# Patient Record
Sex: Female | Born: 1950 | Race: White | Hispanic: No | Smoking: Former smoker
Health system: Southern US, Community
[De-identification: ages and names within clinical notes are randomized; demographics above are authoritative.]

## PROBLEM LIST (undated history)

## (undated) DIAGNOSIS — E559 Vitamin D deficiency, unspecified: Secondary | ICD-10-CM

## (undated) DIAGNOSIS — I10 Essential (primary) hypertension: Secondary | ICD-10-CM

## (undated) DIAGNOSIS — E039 Hypothyroidism, unspecified: Secondary | ICD-10-CM

## (undated) DIAGNOSIS — C3490 Malignant neoplasm of unspecified part of unspecified bronchus or lung: Secondary | ICD-10-CM

## (undated) DIAGNOSIS — Z860100 Personal history of colon polyps, unspecified: Secondary | ICD-10-CM

## (undated) DIAGNOSIS — I700 Atherosclerosis of aorta: Secondary | ICD-10-CM

## (undated) DIAGNOSIS — I499 Cardiac arrhythmia, unspecified: Secondary | ICD-10-CM

## (undated) DIAGNOSIS — G4700 Insomnia, unspecified: Secondary | ICD-10-CM

## (undated) DIAGNOSIS — M4800 Spinal stenosis, site unspecified: Secondary | ICD-10-CM

## (undated) DIAGNOSIS — R0602 Shortness of breath: Secondary | ICD-10-CM

## (undated) DIAGNOSIS — K219 Gastro-esophageal reflux disease without esophagitis: Secondary | ICD-10-CM

## (undated) DIAGNOSIS — D649 Anemia, unspecified: Secondary | ICD-10-CM

## (undated) DIAGNOSIS — R002 Palpitations: Secondary | ICD-10-CM

## (undated) DIAGNOSIS — J449 Chronic obstructive pulmonary disease, unspecified: Secondary | ICD-10-CM

## (undated) DIAGNOSIS — E079 Disorder of thyroid, unspecified: Secondary | ICD-10-CM

## (undated) DIAGNOSIS — Z87891 Personal history of nicotine dependence: Secondary | ICD-10-CM

## (undated) DIAGNOSIS — I471 Supraventricular tachycardia, unspecified: Secondary | ICD-10-CM

## (undated) DIAGNOSIS — I2510 Atherosclerotic heart disease of native coronary artery without angina pectoris: Secondary | ICD-10-CM

## (undated) DIAGNOSIS — Z923 Personal history of irradiation: Secondary | ICD-10-CM

## (undated) DIAGNOSIS — D249 Benign neoplasm of unspecified breast: Secondary | ICD-10-CM

## (undated) DIAGNOSIS — R0600 Dyspnea, unspecified: Secondary | ICD-10-CM

## (undated) DIAGNOSIS — E7800 Pure hypercholesterolemia, unspecified: Secondary | ICD-10-CM

## (undated) DIAGNOSIS — G4730 Sleep apnea, unspecified: Secondary | ICD-10-CM

## (undated) DIAGNOSIS — F419 Anxiety disorder, unspecified: Secondary | ICD-10-CM

## (undated) DIAGNOSIS — C50919 Malignant neoplasm of unspecified site of unspecified female breast: Secondary | ICD-10-CM

## (undated) DIAGNOSIS — C801 Malignant (primary) neoplasm, unspecified: Secondary | ICD-10-CM

## (undated) HISTORY — DX: Supraventricular tachycardia, unspecified: I47.10

## (undated) HISTORY — DX: Atherosclerotic heart disease of native coronary artery without angina pectoris: I25.10

## (undated) HISTORY — PX: BREAST BIOPSY: SHX20

## (undated) HISTORY — PX: CHOLECYSTECTOMY: SHX55

## (undated) HISTORY — DX: Chronic obstructive pulmonary disease, unspecified: J44.9

## (undated) HISTORY — DX: Malignant neoplasm of unspecified site of unspecified female breast: C50.919

## (undated) HISTORY — DX: Atherosclerosis of aorta: I70.0

## (undated) HISTORY — DX: Palpitations: R00.2

## (undated) HISTORY — DX: Benign neoplasm of unspecified breast: D24.9

## (undated) HISTORY — DX: Personal history of nicotine dependence: Z87.891

## (undated) HISTORY — DX: Disorder of thyroid, unspecified: E07.9

## (undated) HISTORY — DX: Anxiety disorder, unspecified: F41.9

## (undated) HISTORY — DX: Spinal stenosis, site unspecified: M48.00

## (undated) HISTORY — DX: Malignant (primary) neoplasm, unspecified: C80.1

---

## 1973-08-15 HISTORY — PX: BREAST EXCISIONAL BIOPSY: SUR124

## 1976-08-15 HISTORY — PX: VAGINAL HYSTERECTOMY: SUR661

## 1983-08-16 HISTORY — PX: BREAST CYST ASPIRATION: SHX578

## 1993-08-15 DIAGNOSIS — C801 Malignant (primary) neoplasm, unspecified: Secondary | ICD-10-CM

## 1993-08-15 HISTORY — PX: ROTATOR CUFF REPAIR: SHX139

## 1993-08-15 HISTORY — DX: Malignant (primary) neoplasm, unspecified: C80.1

## 1993-08-15 HISTORY — PX: VULVECTOMY PARTIAL: SHX6187

## 1999-08-16 HISTORY — PX: BLADDER SUSPENSION: SHX72

## 2005-11-16 ENCOUNTER — Ambulatory Visit: Payer: Self-pay | Admitting: Family Medicine

## 2007-05-07 ENCOUNTER — Ambulatory Visit: Payer: Self-pay | Admitting: Family Medicine

## 2010-09-16 ENCOUNTER — Ambulatory Visit: Payer: Self-pay | Admitting: Family Medicine

## 2010-10-21 ENCOUNTER — Ambulatory Visit: Payer: Self-pay | Admitting: Family Medicine

## 2011-12-07 ENCOUNTER — Ambulatory Visit: Payer: Self-pay | Admitting: Family Medicine

## 2012-08-15 HISTORY — PX: COLONOSCOPY: SHX174

## 2012-12-25 ENCOUNTER — Ambulatory Visit: Payer: Self-pay | Admitting: Family Medicine

## 2013-01-11 ENCOUNTER — Ambulatory Visit: Payer: Self-pay | Admitting: Family Medicine

## 2013-04-04 ENCOUNTER — Ambulatory Visit: Payer: Self-pay | Admitting: Unknown Physician Specialty

## 2013-04-04 LAB — HM COLONOSCOPY

## 2013-04-08 LAB — PATHOLOGY REPORT

## 2014-01-02 ENCOUNTER — Ambulatory Visit: Payer: Self-pay | Admitting: Family Medicine

## 2014-01-02 ENCOUNTER — Encounter: Payer: Self-pay | Admitting: General Surgery

## 2014-01-02 ENCOUNTER — Ambulatory Visit (INDEPENDENT_AMBULATORY_CARE_PROVIDER_SITE_OTHER): Payer: BC Managed Care – PPO | Admitting: General Surgery

## 2014-01-02 VITALS — BP 190/96 | HR 88 | Resp 14 | Ht 64.5 in | Wt 201.0 lb

## 2014-01-02 DIAGNOSIS — N63 Unspecified lump in unspecified breast: Secondary | ICD-10-CM

## 2014-01-02 DIAGNOSIS — R928 Other abnormal and inconclusive findings on diagnostic imaging of breast: Secondary | ICD-10-CM

## 2014-01-02 NOTE — Patient Instructions (Addendum)
Continue self breast exams. Call office for any new breast issues or concerns. Breast biopsy for 01-03-14

## 2014-01-02 NOTE — Progress Notes (Signed)
Patient ID: Michelle Pitts, female   DOB: 09-Jun-1951, 63 y.o.   MRN: [id number]  Chief Complaint  Patient presents with  . Breast Problem    mammogram    HPI Michelle Pitts is a 63 y.o. female.  who presents for a breast evaluation. The most recent mammogram was done on 01-02-14. She states she that last year [place] brought her attention to a dimpling "indention" in the left breast that is still there. She had decided to monitor the area and she does not feel it has changed but now there a knot above the area that she can feel and found in March which does seem to be getting larger.  The patient underwent surgery for vulvar dysplasia in 1995 and reports that she was placed on Premarin at that time. She is continued on the medication with attempt to discontinue it associated with debilitating vasomotor symptoms.  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 She provides afternoon care for her fifth grade granddaughter and every other weekend takes her 10-year-old twin grandchildren.  Patient does perform regular self breast checks and gets regular mammograms done.   She states that the mammogram shows a problem in the right breast as well.  HPI  Past Medical History  Diagnosis Date  . Anxiety   . COPD (chronic obstructive pulmonary disease)     bronchitis  . Cancer 1995    vulvar, [facility]    Past Surgical History  Procedure Laterality Date  . Abdominal hysterectomy  1978    partial  . Cholecystectomy    . Bladder suspension  2001  . Rotator cuff repair Right 1995  . Breast biopsy Right 1975, 1985    benign cyst [name]) and fibroadenoma([facility])    Family History  Problem Relation Age of Onset  . Diabetes Father   . COPD Mother   . Cancer Other     great great paternal aunt, ? age  . Cancer Maternal Grandfather 12    ? source  . Cancer Paternal Grandfather 53    ? source    Social History History  Substance Use Topics  . Smoking  status: Current Every Day Smoker -- 1.00 packs/day    Types: Cigarettes  . Smokeless tobacco: Never Used  . Alcohol Use: Yes     Comment: 2/day    Allergies  Allergen Reactions  . Codeine Nausea And Vomiting  . Levaquin [Levofloxacin In D5w] Hives    Current Outpatient Prescriptions  Medication Sig Dispense Refill  . ADVAIR DISKUS 250-50 MCG/DOSE AEPB Inhale 1 puff into the lungs as needed.       . ALPRAZolam (XANAX XR) 0.5 MG 24 hr tablet Take 0.5 mg by mouth as needed for anxiety.      . CHANTIX STARTING MONTH PAK 0.5 MG X 11 & 1 MG X 42 tablet Take 50 mg by mouth 2 (two) times daily.       Marland Kitchen PREMARIN 0.625 MG tablet Take 0.625 mg by mouth daily.       . VENTOLIN HFA 108 (90 BASE) MCG/ACT inhaler Inhale 1 puff into the lungs 4 (four) times daily.        No current facility-administered medications for this visit.    Review of Systems Review of Systems  Constitutional: Negative.   Respiratory: Positive for shortness of breath.   Cardiovascular: Negative.     Blood pressure 190/96, pulse 88, resp. rate 14, height 5' 4.5" (1.638 m), weight  201 lb (91.173 kg).  Physical Exam Physical Exam  Constitutional: She is oriented to person, place, and time. She appears well-developed and well-nourished.  Eyes: Conjunctivae are normal.  Neck: Neck supple.  Cardiovascular: Normal rate, regular rhythm and normal heart sounds.   Pulmonary/Chest: Effort normal and breath sounds normal. Right breast exhibits no inverted nipple, no mass, no nipple discharge, no skin change and no tenderness. Left breast exhibits skin change. Left breast exhibits no inverted nipple, no mass, no nipple discharge and no tenderness.    Thickening 9 o'clock 2 CFN right breast 8 o'clock at inframammary fold there is an area of dimpling and faint thickening left breast  Lymphadenopathy:    She has no cervical adenopathy.    She has no axillary adenopathy.  Neurological: She is alert and oriented to person,  place, and time.  Skin: Skin is warm and dry.    Data Reviewed Mammograms dated 12/25/2012 were unremarkable. BI-RAD-1.  Bilateral mammograms dated May 20 one, 2015 were reviewed. Left breast mammogram showed no discernible abnormality. Right breast suggested a subcentimeter nodule in the lateral portion of the breast. Focal spot compression views and ultrasound completed the same date in the left breast showed a 1.4 x 1.5 x 2.3 cm irregular hyperechoic mass associated with the area of skin dimpling in thickening. In the right breast at 9:00 position, 3 cm from the nipple an irregular 6 mm hypoechoic nodule was noted. Axillary evaluation was negative bilaterally. BI-RAD-4.  PCP notes dated 12/30/2013 were reviewed. Associated laboratory studies showed a white blood cell count 4500, hemoglobin 13.9, MCV 95, normal differential, TSH 3.8, hemoglobin 85.6, creatinine 0.7, estimated GFR 89, normal electrolytes, normal liver function studies with the exception of the alkaline phosphatase which is mildly elevated at 130 (39-117) and the SGPT at 42 (0-32).  Assessment    Left breast mass, negative mammogram. Possible lobular carcinoma.  Abnormal right breast mammogram and ultrasound    Plan    Biopsy is indicated of the dominant mass in the left breast and potentially of the right breast. Due to the late hour the patient has been asked to return for 9:30 AM tomorrow morning. I see no contraindication to her driving to [place] later in the day to pick up her grandchildren.  The risks associated with biopsy were reviewed (bleeding, pain, infection) better outweighed by the need to establish a tissue diagnosis.  We briefly discussed that ongoing estrogen replacement will likely be contraindicated with or without an identification of malignancy.     PCP: Lelan Pons Olisa Quesnel 01/02/2014, 5:59 PM

## 2014-01-03 ENCOUNTER — Ambulatory Visit (INDEPENDENT_AMBULATORY_CARE_PROVIDER_SITE_OTHER): Payer: BC Managed Care – PPO

## 2014-01-03 ENCOUNTER — Ambulatory Visit: Payer: BC Managed Care – PPO

## 2014-01-03 ENCOUNTER — Ambulatory Visit (INDEPENDENT_AMBULATORY_CARE_PROVIDER_SITE_OTHER): Payer: BC Managed Care – PPO | Admitting: General Surgery

## 2014-01-03 ENCOUNTER — Encounter: Payer: Self-pay | Admitting: General Surgery

## 2014-01-03 ENCOUNTER — Other Ambulatory Visit: Payer: Self-pay | Admitting: General Surgery

## 2014-01-03 VITALS — BP 140/80 | HR 80 | Resp 16 | Ht 64.5 in | Wt 201.0 lb

## 2014-01-03 DIAGNOSIS — N63 Unspecified lump in unspecified breast: Secondary | ICD-10-CM

## 2014-01-03 DIAGNOSIS — R928 Other abnormal and inconclusive findings on diagnostic imaging of breast: Secondary | ICD-10-CM

## 2014-01-03 HISTORY — PX: BREAST BIOPSY: SHX20

## 2014-01-03 NOTE — Patient Instructions (Signed)

## 2014-01-03 NOTE — Progress Notes (Signed)
Patient ID: Michelle Pitts, female   DOB: 06-21-1951, 63 y.o.   MRN: [id number]  Chief Complaint  Patient presents with  . Follow-up    breast biopsy    HPI Michelle Pitts is a 63 y.o. female who presents for bilateral breast biopsies. Due to the late hour it could not be completed at the time of yesterday's exam. HPI  Past Medical History  Diagnosis Date  . Anxiety   . COPD (chronic obstructive pulmonary disease)     bronchitis  . Cancer 1995    vulvar, [facility]    Past Surgical History  Procedure Laterality Date  . Abdominal hysterectomy  1978    partial  . Cholecystectomy    . Bladder suspension  2001  . Rotator cuff repair Right 1995  . Breast biopsy Right 1975, 1985    benign cyst [name]) and fibroadenoma([facility])    Family History  Problem Relation Age of Onset  . Diabetes Father   . COPD Mother   . Cancer Other     great great paternal aunt, ? age  . Cancer Maternal Grandfather 63    ? source  . Cancer Paternal Grandfather 67    ? source    Social History History  Substance Use Topics  . Smoking status: Current Every Day Smoker -- 1.00 packs/day    Types: Cigarettes  . Smokeless tobacco: Never Used  . Alcohol Use: Yes     Comment: 2/day    Allergies  Allergen Reactions  . Codeine Nausea And Vomiting  . Levaquin [Levofloxacin In D5w] Hives    Current Outpatient Prescriptions  Medication Sig Dispense Refill  . ADVAIR DISKUS 250-50 MCG/DOSE AEPB Inhale 1 puff into the lungs as needed.       . ALPRAZolam (XANAX XR) 0.5 MG 24 hr tablet Take 0.5 mg by mouth as needed for anxiety.      . CHANTIX STARTING MONTH PAK 0.5 MG X 11 & 1 MG X 42 tablet Take 50 mg by mouth 2 (two) times daily.       Marland Kitchen PREMARIN 0.625 MG tablet Take 0.625 mg by mouth daily.       . VENTOLIN HFA 108 (90 BASE) MCG/ACT inhaler Inhale 1 puff into the lungs 4 (four) times daily.        No current facility-administered medications for this visit.    Review of  Systems Review of Systems  Constitutional: Negative.   Respiratory: Negative.   Cardiovascular: Negative.     Blood pressure 140/80, pulse 80, resp. rate 16, height 5' 4.5" (1.638 m), weight 201 lb (91.173 kg).  Physical Exam Physical Exam Unremarkable breast exam on the right. Nodular area at the inframammary fold, 8:00 position of the left breast.     Assessment    Left breast mass, right breast mammographic/ultrasound abnormality.    Plan    Plans for biopsy were reviewed. The right breast ultrasound showed an ill-defined 0.4 x 0.5 x 0.6 cm hypoechoic nodule at the 9:00 position, 2 cm from the nipple. 10 cc of 0.5% Xylocaine with 0.25% Marcaine with 1-200,000 units of epinephrine was instilled and well tolerated. ChloraPrep was applied to the skin. A 10-gauge Encor device was passed through the area under direct vision and the lesion was completely removed. The patient did experience pain with one sample collection. No bleeding. A postbiopsy clip was placed. The skin defect was closed with benzoin and Steri-Strip followed by Telfa and Tegaderm dressing.  The left  breast showed a irregular hypoechoic densely shadowing 1.2 x 1.4 x 1.8 cm mass at the inframammary fold, 8:00 position. 10 cc of 0.5% Xylocaine with 0.25% Marcaine with 1 200,000 units of epinephrine was instilled well tolerated. A 14-gauge spring loaded biopsy device was used and multiple core samples obtained. No bleeding was noted. A clip was not placed. The procedure was well tolerated. Skin defect was closed with benzoin and Steri-Strips followed by Telfa and Tegaderm dressing.  Instructions were provided for postbiopsy wound care. She will be contacted when pathology is available. The patient is aware there may be a one-day delay due to the [facility] Day holiday.     PCP: Lelan Pons Oluwadamilola Deliz 01/03/2014, 10:56 AM

## 2014-01-07 ENCOUNTER — Telehealth: Payer: Self-pay | Admitting: *Deleted

## 2014-01-07 NOTE — Telephone Encounter (Signed)
Phone call from Dr Reuel Derby pathology. Right breast benign. 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

## 2014-01-08 ENCOUNTER — Ambulatory Visit (INDEPENDENT_AMBULATORY_CARE_PROVIDER_SITE_OTHER): Payer: BC Managed Care – PPO | Admitting: General Surgery

## 2014-01-08 ENCOUNTER — Encounter: Payer: Self-pay | Admitting: General Surgery

## 2014-01-08 VITALS — BP 164/80 | HR 76 | Resp 14 | Ht 64.0 in | Wt 200.0 lb

## 2014-01-08 DIAGNOSIS — C50919 Malignant neoplasm of unspecified site of unspecified female breast: Secondary | ICD-10-CM

## 2014-01-08 DIAGNOSIS — D249 Benign neoplasm of unspecified breast: Secondary | ICD-10-CM

## 2014-01-08 NOTE — Progress Notes (Signed)
Patient ID: RODNEISHA BONNET, female   DOB: [birth year], 63 y.o.   MRN: [id number]  Chief Complaint  Patient presents with  . Results    breast biopsy    HPI Zaynah L Nelon is a 63 y.o. female here today for results on her left breast biopsy. Patient here today for follow up post breast biopsy. , steristrip in place and aware it may come off in one week.  Minimal bruising noted.  The patient is aware that a heating pad may be used for comfort as needed.  Aware of pathology.  HPI  Past Medical History  Diagnosis Date  . Anxiety   . COPD (chronic obstructive pulmonary disease)     bronchitis  . Cancer 1995    vulvar, [facility]  . 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 Breast cancer 01/09/2014    Past Surgical History  Procedure Laterality Date  . Abdominal hysterectomy  1978    partial  . Cholecystectomy    . Bladder suspension  2001  . Rotator cuff repair Right 1995  . Breast biopsy Right 1975, 1985    benign cyst [name]) and fibroadenoma([facility])    Family History  Problem Relation Age of Onset  . Diabetes Father   . COPD Mother   . Cancer Other     great great paternal aunt, ? age  . Cancer Maternal Grandfather 37    ? source  . Cancer Paternal Grandfather 44    ? source    Social History History  Substance Use Topics  . Smoking status: Current Every Day Smoker -- 1.00 packs/day    Types: Cigarettes  . Smokeless tobacco: Never Used  . Alcohol Use: Yes     Comment: 2/day    Allergies  Allergen Reactions  . Codeine Nausea And Vomiting  . Levaquin [Levofloxacin In D5w] Hives    Current Outpatient Prescriptions  Medication Sig Dispense Refill  . ADVAIR DISKUS 250-50 MCG/DOSE AEPB Inhale 1 puff into the lungs as needed.       . ALPRAZolam (XANAX XR) 0.5 MG 24 hr tablet Take 0.5 mg by mouth as needed for anxiety.      . CHANTIX STARTING MONTH PAK 0.5 MG X 11 & 1 MG X 42 tablet Take 50 mg by mouth 2 (two) times daily.       Marland Kitchen PREMARIN 0.625 MG tablet  Take 0.625 mg by mouth daily.       . VENTOLIN HFA 108 (90 BASE) MCG/ACT inhaler Inhale 1 puff into the lungs 4 (four) times daily.        No current facility-administered medications for this visit.    Review of Systems Review of Systems  Constitutional: Negative.   Respiratory: Negative.   Cardiovascular: Negative.     Blood pressure 164/80, pulse 76, resp. rate 14, height 5\' 4"  (1.626 m), weight 200 lb (90.719 kg).  Physical Exam Physical Exam Biopsy sites clean as reported by CMA.  Data Reviewed Results from Jan 03, 2014: Diagnosis: Part A: RIGHT BREAST 9:00 CORE BIOPSY *STAT*: - INTRA DUCTAL PAPILLOMA. - USUAL DUCTAL HYPERPLASIA. - NEGATIVE FOR ATYPIA AND MALIGNANCY. Part B: LEFT BREAST 8:00 CORE BIOPSY *STAT*: - INVASIVE MAMMARY CARCINOMA, NO SPECIAL TYPE. - ANGIOLYMPATIC INVASION IS PRESENT. Comment: Invasive carcinoma measures 1.3 cm in this material. Although the final grade should be assigned on the excision specimen, the findings correspond to a Nottingham grade of 1 (tubule formation score 2, nuclear pleomorphism score 2, mitotic count score 1, total  score /9). ER / PR will be assessed by immunohistochemistry and Her 2 FISH will be performed. Results will be reported separately. Intradepartmental consultation was obtained. These findings were communicated to [facility] in Dr. Curly [place] office at 10:35 AM on 01/07/2014. Read back procedure was performed. RUB/01/07/2014    Assessment    Left breast cancer.  Papilloma of the right breast.     Plan    We spent the better part of an hour reviewing options for management. She was accompanied today by her son. Breast conservation and mastectomy were presented as equivalent procedures. The pros and cons of each were reviewed in detail. Choice of management of the breast primary does not impact the possibility of later chemotherapy recommendations. Role of radiation therapy with breast conservation was emphasized. The  potential for antiestrogen therapy was briefly reviewed. At this time the patient is making use of hormone replacement therapy, and she will continue this pending surgery. The possibility of interruption of sleep, vasomotor symptoms or irritability is not appropriate for the time which is making it important decision regarding management of her cancer.  An informational brochure was provided. She's been asked to contact the office in the next few days to telescope she would like to me with medical radiation oncology, would like to receive a second surgical opinion either locally or at the university or to schedule surgery.        Forest Gleason Cadel Stairs 01/09/2014, 9:22 PM

## 2014-01-09 ENCOUNTER — Encounter: Payer: Self-pay | Admitting: General Surgery

## 2014-01-09 DIAGNOSIS — D249 Benign neoplasm of unspecified breast: Secondary | ICD-10-CM

## 2014-01-09 DIAGNOSIS — C50919 Malignant neoplasm of unspecified site of unspecified female breast: Secondary | ICD-10-CM | POA: Insufficient documentation

## 2014-01-09 HISTORY — DX: Benign neoplasm of unspecified breast: D24.9

## 2014-01-10 ENCOUNTER — Telehealth: Payer: Self-pay | Admitting: *Deleted

## 2014-01-10 ENCOUNTER — Ambulatory Visit: Payer: BC Managed Care – PPO

## 2014-01-10 NOTE — Telephone Encounter (Signed)
Pt was last seen here in the office on 01/08/14 and was told to think about seeing an oncologist and you would get one scheduled for her, she has decided that will be the best thing for her to do.

## 2014-01-13 LAB — PATHOLOGY

## 2014-01-13 NOTE — Telephone Encounter (Signed)
Appt made w/ Kennieth Francois, MD for June 2 at 10 AM.

## 2014-01-14 ENCOUNTER — Ambulatory Visit: Payer: Self-pay | Admitting: Hematology and Oncology

## 2014-01-16 ENCOUNTER — Telehealth: Payer: Self-pay

## 2014-01-16 NOTE — Telephone Encounter (Signed)
Patient called and states that she would like to proceed with breast lumpectomy. She did saw Dr. Kallie Edward on Tuesday. She would like to get this done either next Thursday or Friday if possible.

## 2014-01-17 ENCOUNTER — Telehealth: Payer: Self-pay | Admitting: *Deleted

## 2014-01-17 ENCOUNTER — Telehealth: Payer: Self-pay | Admitting: General Surgery

## 2014-01-17 ENCOUNTER — Other Ambulatory Visit: Payer: Self-pay | Admitting: General Surgery

## 2014-01-17 DIAGNOSIS — C50919 Malignant neoplasm of unspecified site of unspecified female breast: Secondary | ICD-10-CM

## 2014-01-17 NOTE — Telephone Encounter (Signed)
Notes for medical and radiation oncology reviewed.  Patient report she desires to proceed with breast conservation.  Tentative date scheduled for 01/23/2014 for wide excision, sentinel node biopsy and mastoplasty.

## 2014-01-17 NOTE — Telephone Encounter (Signed)
x

## 2014-01-17 NOTE — Telephone Encounter (Signed)
Pt is going out of town today, if you need to get in touch with her you can call her on her cell phone at [phone] if not she is aware if Dr.Byrnett does not get back with you she will wait till Monday to hear from you.

## 2014-01-20 ENCOUNTER — Telehealth: Payer: Self-pay

## 2014-01-20 NOTE — Telephone Encounter (Signed)
Spoke with patient about her surgery and instructions. Patient is scheduled for surgery at [facility] on 01/23/14. She will pre admit by phone on 01/21/14. She is to arrive at 7:15 am on 01/23/14 and report to radiology. Patient is aware of date, time, and instructions.

## 2014-01-23 ENCOUNTER — Ambulatory Visit: Payer: Self-pay | Admitting: General Surgery

## 2014-01-23 ENCOUNTER — Encounter: Payer: Self-pay | Admitting: General Surgery

## 2014-01-23 DIAGNOSIS — C50319 Malignant neoplasm of lower-inner quadrant of unspecified female breast: Secondary | ICD-10-CM

## 2014-01-23 DIAGNOSIS — C50919 Malignant neoplasm of unspecified site of unspecified female breast: Secondary | ICD-10-CM

## 2014-01-23 HISTORY — PX: BREAST LUMPECTOMY: SHX2

## 2014-01-23 HISTORY — PX: BREAST SURGERY: SHX581

## 2014-01-23 HISTORY — DX: Malignant neoplasm of unspecified site of unspecified female breast: C50.919

## 2014-01-24 ENCOUNTER — Ambulatory Visit: Payer: BC Managed Care – PPO | Admitting: General Surgery

## 2014-01-24 ENCOUNTER — Encounter: Payer: Self-pay | Admitting: General Surgery

## 2014-01-24 VITALS — BP 164/98 | HR 74 | Resp 12 | Ht 64.0 in | Wt 203.0 lb

## 2014-01-24 DIAGNOSIS — IMO0001 Reserved for inherently not codable concepts without codable children: Secondary | ICD-10-CM | POA: Insufficient documentation

## 2014-01-24 NOTE — Progress Notes (Signed)
Patient ID: Michelle Pitts, female   DOB: 1951/05/25, 63 y.o.   MRN: [id number]  Chief Complaint  Patient presents with  . Wound Check    left breast wide excision     HPI Michelle Pitts is a 63 y.o. female here today for an wound check.She had a left breast wide excision done 01/23/14.  HPI  Past Medical History  Diagnosis Date  . Anxiety   . COPD (chronic obstructive pulmonary disease)     bronchitis  . Cancer 1995    vulvar, [facility]  . 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 Breast cancer 01/09/2014    Past Surgical History  Procedure Laterality Date  . Abdominal hysterectomy  1978    partial  . Cholecystectomy    . Bladder suspension  2001  . Rotator cuff repair Right 1995  . Breast biopsy Right 1975, 1985    benign cyst Michelle Pitts) and fibroadenoma([facility])  . Breast surgery Left 01/23/14    wide excision     Family History  Problem Relation Age of Onset  . Diabetes Father   . COPD Mother   . Cancer Other     great great paternal aunt, ? age  . Cancer Maternal Grandfather 67    ? source  . Cancer Paternal Grandfather 100    ? source    Social History History  Substance Use Topics  . Smoking status: Current Every Day Smoker -- 1.00 packs/day    Types: Cigarettes  . Smokeless tobacco: Never Used  . Alcohol Use: Yes     Comment: 2/day    Allergies  Allergen Reactions  . Codeine Nausea And Vomiting  . Levaquin [Levofloxacin In D5w] Hives    Current Outpatient Prescriptions  Medication Sig Dispense Refill  . ADVAIR DISKUS 250-50 MCG/DOSE AEPB Inhale 1 puff into the lungs as needed.       . ALPRAZolam (XANAX XR) 0.5 MG 24 hr tablet Take 0.5 mg by mouth as needed for anxiety.      . CHANTIX STARTING MONTH PAK 0.5 MG X 11 & 1 MG X 42 tablet Take 50 mg by mouth 2 (two) times daily.       Marland Kitchen PREMARIN 0.625 MG tablet Take 0.625 mg by mouth daily.       . VENTOLIN HFA 108 (90 BASE) MCG/ACT inhaler Inhale 1 puff into the lungs 4 (four) times daily.         No current facility-administered medications for this visit.    Review of Systems Review of Systems  Constitutional: Negative.   Respiratory: Negative.   Cardiovascular: Negative.     Blood pressure 164/98, pulse 74, resp. rate 12, height 5\' 4"  (1.626 m), weight 203 lb (92.08 kg).  Physical Exam Physical Exam  Pulmonary/Chest:    Probing of the incision showed no liquid hematoma. Steri-Strips were reapplied. Fluff gauze, Kerlix and an Ace wrap was placed.       Assessment    Spontaneous hematoma drainage.    Plan    The patient will keep her compressive wrap on for an additional 3 days. Supplies were provided if it needs to be changed. She'll return as scheduled for followup.       Michelle Pitts 01/24/2014, 2:38 PM

## 2014-01-27 LAB — PATHOLOGY REPORT

## 2014-01-28 ENCOUNTER — Telehealth: Payer: Self-pay | Admitting: *Deleted

## 2014-01-28 ENCOUNTER — Encounter: Payer: Self-pay | Admitting: General Surgery

## 2014-01-28 ENCOUNTER — Ambulatory Visit (INDEPENDENT_AMBULATORY_CARE_PROVIDER_SITE_OTHER): Payer: BC Managed Care – PPO | Admitting: General Surgery

## 2014-01-28 VITALS — BP 140/78 | HR 74 | Resp 12 | Ht 64.0 in | Wt 198.0 lb

## 2014-01-28 DIAGNOSIS — C50919 Malignant neoplasm of unspecified site of unspecified female breast: Secondary | ICD-10-CM

## 2014-01-28 NOTE — Progress Notes (Addendum)
Patient ID: Michelle Pitts, female   DOB: [birth year], 63 y.o.   MRN: [id number]  Chief Complaint  Patient presents with  . Routine Post Op     left breast wide excision     HPI Michelle Pitts is a 63 y.o. female here today for an wound check.She had a left breast wide excision done 01/23/14. Patient states she is doing well.  The patient has experienced no further bleeding since that noted on postoperative day 1. She has developed impressive ecchymosis in the area. HPI  HPI  Past Medical History  Diagnosis Date  . Anxiety   . COPD (chronic obstructive pulmonary disease)     bronchitis  . 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 Cancer 1995    vulvar, [facility]  . Breast cancer 01/23/2014    Left breast, T1c, N0, M0. ER/PR +; Her 2 neu not overexpressed.      Past Surgical History  Procedure Laterality Date  . Abdominal hysterectomy  1978    partial  . Cholecystectomy    . Bladder suspension  2001  . Rotator cuff repair Right 1995  . Breast biopsy Right 1975, 1985    benign cyst [name]) and fibroadenoma([facility])  . Breast surgery Left 01/23/14    wide excision     Family History  Problem Relation Age of Onset  . Diabetes Father   . COPD Mother   . Cancer Other     great great paternal aunt, ? age  . Cancer Maternal Grandfather 35    ? source  . Cancer Paternal Grandfather 4    ? source    Social History History  Substance Use Topics  . Smoking status: Current Every Day Smoker -- 1.00 packs/day    Types: Cigarettes  . Smokeless tobacco: Never Used  . Alcohol Use: Yes     Comment: 2/day    Allergies  Allergen Reactions  . Codeine Nausea And Vomiting  . Levaquin [Levofloxacin In D5w] Hives    Current Outpatient Prescriptions  Medication Sig Dispense Refill  . ADVAIR DISKUS 250-50 MCG/DOSE AEPB Inhale 1 puff into the lungs as needed.       . ALPRAZolam (XANAX XR) 0.5 MG 24 hr tablet Take 0.5 mg by mouth as needed for anxiety.      . CHANTIX  STARTING MONTH PAK 0.5 MG X 11 & 1 MG X 42 tablet Take 50 mg by mouth 2 (two) times daily.       Marland Kitchen PREMARIN 0.625 MG tablet Take 0.625 mg by mouth daily.       . VENTOLIN HFA 108 (90 BASE) MCG/ACT inhaler Inhale 1 puff into the lungs 4 (four) times daily.        No current facility-administered medications for this visit.    Review of Systems Review of Systems  Constitutional: Negative.   Respiratory: Negative.   Cardiovascular: Negative.     Blood pressure 140/78, pulse 74, resp. rate 12, height 5' 4"  (1.626 m), weight 198 lb (89.812 kg).  Physical Exam Physical Exam  Constitutional: She is oriented to person, place, and time. She appears well-developed and well-nourished.  Eyes: Conjunctivae are normal.  Pulmonary/Chest:     1 cm blister in the left inner quadrant.   Neurological: She is alert and oriented to person, place, and time.  Skin: Skin is warm and dry.    Data Reviewed Final pathology showed clear margins. 3 sentinel nodes negative. One non-sentinel node negative. Reexcision of the superior  margin showed no evidence of additional malignancy. ER/PR positive. HER-2/neu not overexpressing.  Assessment    Doing well status post wide excision, mastoplasty and sentinel node biopsy.    Plan    The case was discussed at the [facility] breast cancer tumor board on 01/27/2014.  Oncotype DX testing was recommended. This has been requested.  Local heat to the chest wall to help accelerate resolution of the ecchymosis was discussed.  The patient originally had a medical oncology followup scheduled for next week. A note has been sent to the medical oncologist to see if this appointment should be Or if it should be postponed pending results of her Oncotype Dx test.     PCP: Etheleen Mayhew 01/29/2014, 1:05 PM

## 2014-01-28 NOTE — Telephone Encounter (Signed)
Message copied by Carson Myrtle on Tue Jan 28, 2014  8:09 AM ------      Message from: [place], Forest Gleason      Created: Mon Jan 27, 2014  7:14 PM       Arrange for [facility] lab to send left breast cancer tissue off for Oncotype DX testing. Thanks. ------

## 2014-01-28 NOTE — Telephone Encounter (Signed)
Notified Santiago Glad to initiate process for Oncotype.

## 2014-01-28 NOTE — Patient Instructions (Addendum)
The patient is aware to use a heating pad as needed for comfort.Patient to return in two weeks.

## 2014-01-29 ENCOUNTER — Encounter: Payer: Self-pay | Admitting: General Surgery

## 2014-02-06 LAB — CBC CANCER CENTER
BASOS PCT: 2 %
Basophil #: 0.1 x10 3/mm (ref 0.0–0.1)
EOS PCT: 6.8 %
Eosinophil #: 0.5 x10 3/mm (ref 0.0–0.7)
HCT: 39.2 % (ref 35.0–47.0)
HGB: 13.1 g/dL (ref 12.0–16.0)
Lymphocyte #: 1.7 x10 3/mm (ref 1.0–3.6)
Lymphocyte %: 24.5 %
MCH: 31.9 pg (ref 26.0–34.0)
MCHC: 33.5 g/dL (ref 32.0–36.0)
MCV: 95 fL (ref 80–100)
MONOS PCT: 5.1 %
Monocyte #: 0.4 x10 3/mm (ref 0.2–0.9)
Neutrophil #: 4.3 x10 3/mm (ref 1.4–6.5)
Neutrophil %: 61.6 %
Platelet: 326 x10 3/mm (ref 150–440)
RBC: 4.12 10*6/uL (ref 3.80–5.20)
RDW: 12.9 % (ref 11.5–14.5)
WBC: 6.9 x10 3/mm (ref 3.6–11.0)

## 2014-02-06 LAB — BASIC METABOLIC PANEL
ANION GAP: 8 (ref 7–16)
BUN: 11 mg/dL (ref 7–18)
CALCIUM: 9.5 mg/dL (ref 8.5–10.1)
CREATININE: 0.94 mg/dL (ref 0.60–1.30)
Chloride: 103 mmol/L (ref 98–107)
Co2: 28 mmol/L (ref 21–32)
EGFR (Non-African Amer.): >60
Glucose: 118 mg/dL — ABNORMAL HIGH (ref 65–99)
OSMOLALITY: 278 (ref 275–301)
Potassium: 5.5 mmol/L — ABNORMAL HIGH (ref 3.5–5.1)
Sodium: 139 mmol/L (ref 136–145)

## 2014-02-11 ENCOUNTER — Ambulatory Visit (INDEPENDENT_AMBULATORY_CARE_PROVIDER_SITE_OTHER): Payer: BC Managed Care – PPO | Admitting: General Surgery

## 2014-02-11 ENCOUNTER — Encounter: Payer: Self-pay | Admitting: General Surgery

## 2014-02-11 VITALS — BP 130/76 | HR 74 | Resp 14 | Ht 64.0 in | Wt 199.0 lb

## 2014-02-11 DIAGNOSIS — C50912 Malignant neoplasm of unspecified site of left female breast: Secondary | ICD-10-CM

## 2014-02-11 DIAGNOSIS — C50919 Malignant neoplasm of unspecified site of unspecified female breast: Secondary | ICD-10-CM

## 2014-02-11 NOTE — Patient Instructions (Signed)
Patient to return in 2 weeks

## 2014-02-11 NOTE — Progress Notes (Signed)
Patient ID: Michelle Pitts, female   DOB: [birth year], 63 y.o.   MRN: [id number]  Chief Complaint  Patient presents with  . Routine Post Op    left breast excision    HPI Michelle Pitts is a 63 y.o. female here today for her post op left breast wide excision done 01/23/14. Patient states she is doing well.  The patient reports that her Oncotype DX score was low and she will not require adjuvant chemotherapy. These results have not been made available for review here.  HPI  Past Medical History  Diagnosis Date  . Anxiety   . COPD (chronic obstructive pulmonary disease)     bronchitis  . 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 Cancer 1995    vulvar, [facility]  . Breast cancer 01/23/2014    Left breast, T1c, N0, M0. ER/PR +; Her 2 neu not overexpressed.      Past Surgical History  Procedure Laterality Date  . Abdominal hysterectomy  1978    partial  . Cholecystectomy    . Bladder suspension  2001  . Rotator cuff repair Right 1995  . Breast biopsy Right 1975, 1985    benign cyst [name]) and fibroadenoma([facility])  . Breast surgery Left 01/23/14    wide excision     Family History  Problem Relation Age of Onset  . Diabetes Father   . COPD Mother   . Cancer Other     great great paternal aunt, ? age  . Cancer Maternal Grandfather 29    ? source  . Cancer Paternal Grandfather 65    ? source    Social History History  Substance Use Topics  . Smoking status: Current Every Day Smoker -- 1.00 packs/day    Types: Cigarettes  . Smokeless tobacco: Never Used  . Alcohol Use: Yes     Comment: 2/day    Allergies  Allergen Reactions  . Codeine Nausea And Vomiting  . Levaquin [Levofloxacin In D5w] Hives    Current Outpatient Prescriptions  Medication Sig Dispense Refill  . ADVAIR DISKUS 250-50 MCG/DOSE AEPB Inhale 1 puff into the lungs as needed.       . ALPRAZolam (XANAX XR) 0.5 MG 24 hr tablet Take 0.5 mg by mouth as needed for anxiety.      . CHANTIX STARTING  MONTH PAK 0.5 MG X 11 & 1 MG X 42 tablet Take 50 mg by mouth 2 (two) times daily.       . VENTOLIN HFA 108 (90 BASE) MCG/ACT inhaler Inhale 1 puff into the lungs 4 (four) times daily.        No current facility-administered medications for this visit.    Review of Systems Review of Systems  Constitutional: Negative.   Respiratory: Negative.   Cardiovascular: Negative.     Blood pressure 130/76, pulse 74, resp. rate 14, height 5\' 4"  (1.626 m), weight 199 lb (90.266 kg).  Physical Exam Physical Exam  Constitutional: She is oriented to person, place, and time. She appears well-nourished.  Eyes: Conjunctivae are normal. No scleral icterus.  Cardiovascular: Normal rate, regular rhythm and normal heart sounds.   Pulmonary/Chest: Effort normal and breath sounds normal.    Left breast thickening along the scar. Drained 60 ml of old blood   Neurological: She is alert and oriented to person, place, and time.  Skin: Skin is warm and dry.      Assessment    Doing well status post wide excision mastoplasty. Improve cosmetic appearance  Plan    Will arrange for a follow up exam in 2 weeks.    PCP: Etheleen Mayhew 02/11/2014, 7:06 PM

## 2014-02-12 ENCOUNTER — Ambulatory Visit: Payer: Self-pay | Admitting: Hematology and Oncology

## 2014-02-13 LAB — HER-2 / NEU, FISH
AVG NUM CEP17 PROBES/NUCLEUS: 1.9
Avg Num Her-2 signals/nucleus:: 2
HER-2/CEP17 Ratio: 1.09
Number of Observers:: 2
Number of Tumor Cells Counted:: 40

## 2014-02-13 LAB — ER/PR,IMMUNOHISTOCHEM,PARAFFIN
Estrogen Receptor IHC: >90 %
Progesterone Recp IP: >90 %

## 2014-02-21 ENCOUNTER — Ambulatory Visit (INDEPENDENT_AMBULATORY_CARE_PROVIDER_SITE_OTHER): Payer: BC Managed Care – PPO | Admitting: *Deleted

## 2014-02-21 ENCOUNTER — Encounter: Payer: Self-pay | Admitting: General Surgery

## 2014-02-21 DIAGNOSIS — C50919 Malignant neoplasm of unspecified site of unspecified female breast: Secondary | ICD-10-CM

## 2014-02-21 DIAGNOSIS — C50912 Malignant neoplasm of unspecified site of left female breast: Secondary | ICD-10-CM

## 2014-02-21 NOTE — Progress Notes (Signed)
Patient came in today for a wound check left lumpectomy site .  Drained  60 cc of fluid . Hematoma present.

## 2014-02-26 ENCOUNTER — Encounter: Payer: Self-pay | Admitting: General Surgery

## 2014-02-26 ENCOUNTER — Ambulatory Visit (INDEPENDENT_AMBULATORY_CARE_PROVIDER_SITE_OTHER): Payer: BC Managed Care – PPO | Admitting: General Surgery

## 2014-02-26 VITALS — BP 174/94 | HR 74 | Resp 12 | Ht 64.0 in | Wt 199.0 lb

## 2014-02-26 DIAGNOSIS — C50912 Malignant neoplasm of unspecified site of left female breast: Secondary | ICD-10-CM

## 2014-02-26 DIAGNOSIS — C50919 Malignant neoplasm of unspecified site of unspecified female breast: Secondary | ICD-10-CM

## 2014-02-26 NOTE — Patient Instructions (Addendum)
Patient to return in three weeks.

## 2014-02-26 NOTE — Progress Notes (Signed)
Patient ID: Michelle Pitts, female   DOB: [birth year], 63 y.o.   MRN: [id number]  Chief Complaint  Patient presents with  . Routine Post Op    left breast wide excision    HPI Michelle Pitts is a 63 y.o. female female here today for her post op left breast wide excision done 01/23/14. Patient states she is doing okay . She has been dizzy earlier this week, but not so today. She had come from the [facility] where she was fitted for the mold for external beam radiation.  HPI  Past Medical History  Diagnosis Date  . Anxiety   . COPD (chronic obstructive pulmonary disease)     bronchitis  . 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 Cancer 1995    vulvar, [facility]  . Breast cancer 01/23/2014    Left breast, T1c, N0, M0. ER/PR +; Her 2 neu not overexpressed. Oncotype DX: 13,Low risk.9% over 10 years.     Past Surgical History  Procedure Laterality Date  . Abdominal hysterectomy  1978    partial  . Cholecystectomy    . Bladder suspension  2001  . Rotator cuff repair Right 1995  . Breast biopsy Right 1975, 1985    benign cyst [name]) and fibroadenoma([facility])  . Breast surgery Left 01/23/14    wide excision     Family History  Problem Relation Age of Onset  . Diabetes Father   . COPD Mother   . Cancer Other     great great paternal aunt, ? age  . Cancer Maternal Grandfather 71    ? source  . Cancer Paternal Grandfather 62    ? source    Social History History  Substance Use Topics  . Smoking status: Current Every Day Smoker -- 1.00 packs/day    Types: Cigarettes  . Smokeless tobacco: Never Used  . Alcohol Use: Yes     Comment: 2/day    Allergies  Allergen Reactions  . Codeine Nausea And Vomiting  . Levaquin [Levofloxacin In D5w] Hives    Current Outpatient Prescriptions  Medication Sig Dispense Refill  . ADVAIR DISKUS 250-50 MCG/DOSE AEPB Inhale 1 puff into the lungs as needed.       . ALPRAZolam (XANAX XR) 0.5 MG 24 hr tablet Take 0.5 mg by mouth as  needed for anxiety.      . CHANTIX STARTING MONTH PAK 0.5 MG X 11 & 1 MG X 42 tablet Take 50 mg by mouth 2 (two) times daily.       . VENTOLIN HFA 108 (90 BASE) MCG/ACT inhaler Inhale 1 puff into the lungs 4 (four) times daily.        No current facility-administered medications for this visit.    Review of Systems Review of Systems  Constitutional: Negative.   Respiratory: Negative.   Cardiovascular: Negative.     Blood pressure 174/94, pulse 74, resp. rate 12, height 5\' 4"  (1.626 m), weight 199 lb (90.266 kg). PCP systolic was 270 on initial presentation but had fallen to 160 prior to discharge. Physical Exam Physical Exam  Constitutional: She is oriented to person, place, and time. She appears well-developed and well-nourished.  Eyes: Conjunctivae are normal. No scleral icterus.  Neck: Neck supple.  Cardiovascular: Normal rate, regular rhythm and normal heart sounds.   Pulmonary/Chest: Effort normal and breath sounds normal.    8 by 30 mm scab on left breast  Abdominal: Soft. Bowel sounds are normal. There is no tenderness.  Lymphadenopathy:  She has no cervical adenopathy.    She has no axillary adenopathy.  Neurological: She is alert and oriented to person, place, and time.  Skin: Skin is warm and dry.    Data Reviewed Oncotype DX:  9% 10 year recurrence risk. Assessment    Resolving hematoma status post left breast wide excision.  Transient hypertension, no past history of essential hypertension    Plan    Radiation therapy has been delayed to July 27 to allow further healing of the breast. We'll plan for office follow up here in 3 weeks to reassess the wound.  Her blood pressure only to be followed carefully, as today may be indication that she runs high.    PCP: Etheleen Mayhew 02/26/2014, 1:29 PM

## 2014-03-11 ENCOUNTER — Encounter: Payer: Self-pay | Admitting: General Surgery

## 2014-03-11 ENCOUNTER — Ambulatory Visit (INDEPENDENT_AMBULATORY_CARE_PROVIDER_SITE_OTHER): Payer: BC Managed Care – PPO | Admitting: General Surgery

## 2014-03-11 VITALS — BP 134/72 | HR 76 | Resp 14 | Ht 64.0 in | Wt 200.0 lb

## 2014-03-11 DIAGNOSIS — C50912 Malignant neoplasm of unspecified site of left female breast: Secondary | ICD-10-CM

## 2014-03-11 DIAGNOSIS — C50919 Malignant neoplasm of unspecified site of unspecified female breast: Secondary | ICD-10-CM

## 2014-03-11 DIAGNOSIS — N641 Fat necrosis of breast: Secondary | ICD-10-CM

## 2014-03-11 NOTE — Progress Notes (Signed)
Patient ID: Michelle Pitts, female   DOB: 12-Jul-1951, 63 y.o.   MRN: [id number]  Chief Complaint  Patient presents with  . Follow-up    HPI Michelle Pitts is a 63 y.o. female . female female here today for her post op left breast wide excision done 01/23/14. Patient states she is swelling and draining in her left breast.   HPI  Past Medical History  Diagnosis Date  . Anxiety   . COPD (chronic obstructive pulmonary disease)     bronchitis  . 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 Cancer 1995    vulvar, [facility]  . Breast cancer 01/23/2014    Left breast, T1c, N0, M0. ER/PR +; Her 2 neu not overexpressed. Oncotype DX: 13,Low risk.9% over 10 years.     Past Surgical History  Procedure Laterality Date  . Abdominal hysterectomy  1978    partial  . Cholecystectomy    . Bladder suspension  2001  . Rotator cuff repair Right 1995  . Breast biopsy Right 1975, 1985    benign cyst [name]) and fibroadenoma([facility])  . Breast surgery Left 01/23/14    wide excision     Family History  Problem Relation Age of Onset  . Diabetes Father   . COPD Mother   . Cancer Other     great great paternal aunt, ? age  . Cancer Maternal Grandfather 34    ? source  . Cancer Paternal Grandfather 39    ? source    Social History History  Substance Use Topics  . Smoking status: Current Every Day Smoker -- 1.00 packs/day    Types: Cigarettes  . Smokeless tobacco: Never Used  . Alcohol Use: Yes     Comment: 2/day    Allergies  Allergen Reactions  . Codeine Nausea And Vomiting  . Levaquin [Levofloxacin In D5w] Hives    Current Outpatient Prescriptions  Medication Sig Dispense Refill  . metoprolol succinate (TOPROL-XL) 25 MG 24 hr tablet       . ADVAIR DISKUS 250-50 MCG/DOSE AEPB Inhale 1 puff into the lungs as needed.       . ALPRAZolam (XANAX XR) 0.5 MG 24 hr tablet Take 0.5 mg by mouth as needed for anxiety.      . CHANTIX STARTING MONTH PAK 0.5 MG X 11 & 1 MG X 42 tablet Take  50 mg by mouth 2 (two) times daily.       . VENTOLIN HFA 108 (90 BASE) MCG/ACT inhaler Inhale 1 puff into the lungs 4 (four) times daily.        No current facility-administered medications for this visit.    Review of Systems Review of Systems  Constitutional: Negative.   Respiratory: Negative.   Cardiovascular: Negative.     Blood pressure 134/72, pulse 76, resp. rate 14, height 5\' 4"  (1.626 m), weight 200 lb (90.719 kg).  Physical Exam Physical Exam  Pulmonary/Chest:  Left breast 1 cm opening lateral half of wound .  An area of fat necrosis is appreciated in the medial lateral aspect of the left lower inner quadrant wide excision site.      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 The area was prepped with ChloraPrep and 10 cc of 0.5% Xylocaine with 0.25% Marcaine with 1-200,000 units of epinephrine was utilized for local anesthesia well tolerated. The area fat necrosis were sharply debrided. A  small amount, less than 5 cc of clear colorless liquid fluid was noted and removed. The area was cleansed with peroxide and a dry dressing applied.  The patient will continue to shower and daily. Covered. We'll plan for reassessment one week. This will pushback her plan whole breast radiation.         Robert Bellow 03/13/2014, 12:05 PM

## 2014-03-11 NOTE — Patient Instructions (Signed)
03/19/14 return to office

## 2014-03-13 DIAGNOSIS — N641 Fat necrosis of breast: Secondary | ICD-10-CM | POA: Insufficient documentation

## 2014-03-15 ENCOUNTER — Ambulatory Visit: Payer: Self-pay | Admitting: Oncology

## 2014-03-15 ENCOUNTER — Ambulatory Visit: Payer: Self-pay | Admitting: Hematology and Oncology

## 2014-03-19 ENCOUNTER — Encounter: Payer: Self-pay | Admitting: General Surgery

## 2014-03-19 ENCOUNTER — Ambulatory Visit (INDEPENDENT_AMBULATORY_CARE_PROVIDER_SITE_OTHER): Payer: BC Managed Care – PPO | Admitting: General Surgery

## 2014-03-19 VITALS — BP 142/78 | HR 78 | Resp 12 | Ht 64.0 in | Wt 201.0 lb

## 2014-03-19 DIAGNOSIS — C50919 Malignant neoplasm of unspecified site of unspecified female breast: Secondary | ICD-10-CM

## 2014-03-19 DIAGNOSIS — C50912 Malignant neoplasm of unspecified site of left female breast: Secondary | ICD-10-CM

## 2014-03-19 NOTE — Progress Notes (Signed)
Patient ID: Michelle Pitts, female   DOB: 05/04/1951, 63 y.o.   MRN: [id number]  Chief Complaint  Patient presents with  . Routine Post Op    left breast wide excision     HPI Michelle Pitts is a 63 y.o. femalehere today for her post op left breast wide excision done 01/23/14.Patient states she area is still draining a lot .   HPI  Past Medical History  Diagnosis Date  . Anxiety   . COPD (chronic obstructive pulmonary disease)     bronchitis  . 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 Cancer 1995    vulvar, [facility]  . Breast cancer 01/23/2014    Left breast, T1c, N0, M0. ER/PR +; Her 2 neu not overexpressed. Oncotype DX: 13,Low risk.9% over 10 years.     Past Surgical History  Procedure Laterality Date  . Abdominal hysterectomy  1978    partial  . Cholecystectomy    . Bladder suspension  2001  . Rotator cuff repair Right 1995  . Breast biopsy Right 1975, 1985    benign cyst [name]) and fibroadenoma([facility])  . Breast surgery Left 01/23/14    wide excision     Family History  Problem Relation Age of Onset  . Diabetes Father   . COPD Mother   . Cancer Other     great great paternal aunt, ? age  . Cancer Maternal Grandfather 63    ? source  . Cancer Paternal Grandfather 75    ? source    Social History History  Substance Use Topics  . Smoking status: Current Every Day Smoker -- 1.00 packs/day    Types: Cigarettes  . Smokeless tobacco: Never Used  . Alcohol Use: Yes     Comment: 2/day    Allergies  Allergen Reactions  . Codeine Nausea And Vomiting  . Levaquin [Levofloxacin In D5w] Hives    Current Outpatient Prescriptions  Medication Sig Dispense Refill  . ADVAIR DISKUS 250-50 MCG/DOSE AEPB Inhale 1 puff into the lungs as needed.       . ALPRAZolam (XANAX XR) 0.5 MG 24 hr tablet Take 0.5 mg by mouth as needed for anxiety.      . CHANTIX STARTING MONTH PAK 0.5 MG X 11 & 1 MG X 42 tablet Take 50 mg by mouth 2 (two) times daily.       . metoprolol  succinate (TOPROL-XL) 25 MG 24 hr tablet       . VENTOLIN HFA 108 (90 BASE) MCG/ACT inhaler Inhale 1 puff into the lungs 4 (four) times daily.        No current facility-administered medications for this visit.    Review of Systems Review of Systems  Constitutional: Negative.   Respiratory: Negative.   Cardiovascular: Negative.     Blood pressure 142/78, pulse 78, resp. rate 12, height 5\' 4"  (1.626 m), weight 201 lb (91.173 kg).  Physical Exam Physical Exam  Constitutional: She appears well-developed and well-nourished.  Eyes: Conjunctivae are normal.  Neck: Neck supple.  Pulmonary/Chest:    Neurological: She is alert.  Skin: Skin is warm and dry.     Assessment    Fat necrosis status post wide excision and mastoplasty.    Plan    The patient is frustrated by the delay, pulled reassure this has nothing to do with the malignancy, only the surgery itself. She will continue to keep the area covered.  We'll reassess the area in one week.    PCP:  Margit Hanks W 03/21/2014, 10:01 AM

## 2014-03-26 ENCOUNTER — Encounter: Payer: Self-pay | Admitting: General Surgery

## 2014-03-26 ENCOUNTER — Ambulatory Visit (INDEPENDENT_AMBULATORY_CARE_PROVIDER_SITE_OTHER): Payer: BC Managed Care – PPO | Admitting: General Surgery

## 2014-03-26 VITALS — BP 138/80 | HR 84 | Resp 12 | Ht 64.0 in | Wt 204.0 lb

## 2014-03-26 DIAGNOSIS — N641 Fat necrosis of breast: Secondary | ICD-10-CM

## 2014-03-26 DIAGNOSIS — C50919 Malignant neoplasm of unspecified site of unspecified female breast: Secondary | ICD-10-CM

## 2014-03-26 DIAGNOSIS — C50912 Malignant neoplasm of unspecified site of left female breast: Secondary | ICD-10-CM

## 2014-03-26 NOTE — Progress Notes (Signed)
Patient ID: Michelle Pitts, female   DOB: 07-09-1951, 63 y.o.   MRN: [id number]  Chief Complaint  Patient presents with  . Follow-up    left breast wide excisiion     HPI Michelle Pitts is a 63 y.o. female  here today for her post op left breast wide excision done 01/23/14. Patient states she area is still draining about the same as her last visit which was on the 03/19/14.  HPI  Past Medical History  Diagnosis Date  . Anxiety   . COPD (chronic obstructive pulmonary disease)     bronchitis  . 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 Cancer 1995    vulvar, [facility]  . Breast cancer 01/23/2014    Left breast, T1c, N0, M0. ER/PR +; Her 2 neu not overexpressed. Oncotype DX: 13,Low risk.9% over 10 years.     Past Surgical History  Procedure Laterality Date  . Abdominal hysterectomy  1978    partial  . Cholecystectomy    . Bladder suspension  2001  . Rotator cuff repair Right 1995  . Breast biopsy Right 1975, 1985    benign cyst [name]) and fibroadenoma([facility])  . Breast surgery Left 01/23/14    wide excision     Family History  Problem Relation Age of Onset  . Diabetes Father   . COPD Mother   . Cancer Other     great great paternal aunt, ? age  . Cancer Maternal Grandfather 52    ? source  . Cancer Paternal Grandfather 36    ? source    Social History History  Substance Use Topics  . Smoking status: Current Every Day Smoker -- 1.00 packs/day    Types: Cigarettes  . Smokeless tobacco: Never Used  . Alcohol Use: Yes     Comment: 2/day    Allergies  Allergen Reactions  . Codeine Nausea And Vomiting  . Levaquin [Levofloxacin In D5w] Hives    Current Outpatient Prescriptions  Medication Sig Dispense Refill  . ADVAIR DISKUS 250-50 MCG/DOSE AEPB Inhale 1 puff into the lungs as needed.       . ALPRAZolam (XANAX XR) 0.5 MG 24 hr tablet Take 0.5 mg by mouth as needed for anxiety.      . CHANTIX STARTING MONTH PAK 0.5 MG X 11 & 1 MG X 42 tablet Take 50 mg by  mouth 2 (two) times daily.       . metoprolol succinate (TOPROL-XL) 25 MG 24 hr tablet       . VENTOLIN HFA 108 (90 BASE) MCG/ACT inhaler Inhale 1 puff into the lungs 4 (four) times daily.        No current facility-administered medications for this visit.    Review of Systems Review of Systems  Constitutional: Negative.   Respiratory: Negative.   Cardiovascular: Negative.     Blood pressure 138/80, pulse 84, resp. rate 12, height 5\' 4"  (1.626 m), weight 204 lb (92.534 kg).  Physical Exam Physical Exam  Constitutional: She is oriented to person, place, and time. She appears well-developed and well-nourished.  Eyes: Conjunctivae are normal. No scleral icterus.  Neck: Neck supple.  Cardiovascular: Normal rate, regular rhythm and normal heart sounds.   Pulmonary/Chest: Effort normal and breath sounds normal.    Neurological: She is alert and oriented to person, place, and time.  Skin: Skin is warm and dry.     Assessment    Fat necrosis status post mastoplasty.     Plan  Radiation oncology is on hold pending healing. Considering the lack of progress over the last several weeks our options are to continue to allow this to heal spontaneously or to re-excise the area of fat necrosis, reclose the area anticipating a one-month delay prior to the initiation of radiation. Both the patient and I feel that moving ahead with reexcision is most appropriate.  Patient's surgery has been scheduled for 04-10-14 at [facility].     PCP: Etheleen Mayhew 03/27/2014, 6:12 PM

## 2014-03-26 NOTE — Patient Instructions (Addendum)
Patient to be scheduled for excision left breast.  Patient's surgery has been scheduled for 04-10-14 at [facility].

## 2014-03-27 ENCOUNTER — Other Ambulatory Visit: Payer: Self-pay | Admitting: General Surgery

## 2014-03-27 DIAGNOSIS — N641 Fat necrosis of breast: Secondary | ICD-10-CM

## 2014-04-08 ENCOUNTER — Ambulatory Visit: Payer: Self-pay | Admitting: Unknown Physician Specialty

## 2014-04-10 ENCOUNTER — Ambulatory Visit: Payer: Self-pay | Admitting: General Surgery

## 2014-04-10 DIAGNOSIS — N63 Unspecified lump in unspecified breast: Secondary | ICD-10-CM

## 2014-04-10 DIAGNOSIS — Z853 Personal history of malignant neoplasm of breast: Secondary | ICD-10-CM

## 2014-04-10 HISTORY — PX: BREAST SURGERY: SHX581

## 2014-04-10 HISTORY — PX: OTHER SURGICAL HISTORY: SHX169

## 2014-04-11 ENCOUNTER — Encounter: Payer: Self-pay | Admitting: General Surgery

## 2014-04-14 ENCOUNTER — Encounter: Payer: Self-pay | Admitting: General Surgery

## 2014-04-14 ENCOUNTER — Telehealth: Payer: Self-pay | Admitting: General Surgery

## 2014-04-14 LAB — PATHOLOGY REPORT

## 2014-04-14 NOTE — Telephone Encounter (Signed)
x

## 2014-04-15 ENCOUNTER — Ambulatory Visit (INDEPENDENT_AMBULATORY_CARE_PROVIDER_SITE_OTHER): Payer: Self-pay | Admitting: General Surgery

## 2014-04-15 ENCOUNTER — Encounter: Payer: Self-pay | Admitting: General Surgery

## 2014-04-15 VITALS — BP 134/72 | HR 76 | Resp 12 | Ht 65.0 in | Wt 204.0 lb

## 2014-04-15 DIAGNOSIS — N641 Fat necrosis of breast: Secondary | ICD-10-CM

## 2014-04-15 DIAGNOSIS — C50919 Malignant neoplasm of unspecified site of unspecified female breast: Secondary | ICD-10-CM

## 2014-04-15 DIAGNOSIS — C50912 Malignant neoplasm of unspecified site of left female breast: Secondary | ICD-10-CM

## 2014-04-15 NOTE — Patient Instructions (Signed)
Patient to return in 2 weeks.

## 2014-04-15 NOTE — Progress Notes (Signed)
Patient ID: Michelle Pitts, female   DOB: 1951-02-21, 63 y.o.   MRN: [id number]  Chief Complaint  Patient presents with  . Routine Post Op    left breast mass excision post op    HPI Michelle Pitts is a 63 y.o. female who presents for a post op left breast debridement. The procedure was performed on 04/10/14. The patient denies any problems at this time. Patient is doing well.   HPI  Past Medical History  Diagnosis Date  . Anxiety   . COPD (chronic obstructive pulmonary disease)     bronchitis  . 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 Cancer 1995    vulvar, [facility]  . Breast cancer 01/23/2014    Left breast, T1c, N0, M0. ER/PR +; Her 2 neu not overexpressed. Oncotype DX: 13,Low risk.9% over 10 years.     Past Surgical History  Procedure Laterality Date  . Abdominal hysterectomy  1978    partial  . Cholecystectomy    . Bladder suspension  2001  . Rotator cuff repair Right 1995  . Breast biopsy Right 1975, 1985    benign cyst [name]) and fibroadenoma([facility])  . Breast surgery Left 01/23/14    wide excision   . Breast surgery  04/10/14    Debridement    Family History  Problem Relation Age of Onset  . Diabetes Father   . COPD Mother   . Cancer Other     great great paternal aunt, ? age  . Cancer Maternal Grandfather 8    ? source  . Cancer Paternal Grandfather 73    ? source    Social History History  Substance Use Topics  . Smoking status: Current Every Day Smoker -- 1.00 packs/day    Types: Cigarettes  . Smokeless tobacco: Never Used  . Alcohol Use: Yes     Comment: 2/day    Allergies  Allergen Reactions  . Codeine Nausea And Vomiting  . Levaquin [Levofloxacin In D5w] Hives    Current Outpatient Prescriptions  Medication Sig Dispense Refill  . ADVAIR DISKUS 250-50 MCG/DOSE AEPB Inhale 1 puff into the lungs as needed.       . ALPRAZolam (XANAX XR) 0.5 MG 24 hr tablet Take 0.5 mg by mouth as needed for anxiety.      . CHANTIX STARTING MONTH  PAK 0.5 MG X 11 & 1 MG X 42 tablet Take 50 mg by mouth 2 (two) times daily.       . metoprolol succinate (TOPROL-XL) 25 MG 24 hr tablet       . VENTOLIN HFA 108 (90 BASE) MCG/ACT inhaler Inhale 1 puff into the lungs 4 (four) times daily.        No current facility-administered medications for this visit.    Review of Systems Review of Systems  Constitutional: Negative.   Respiratory: Negative.   Cardiovascular: Negative.     Blood pressure 134/72, pulse 76, resp. rate 12, height 5\' 5"  (1.651 m), weight 204 lb (92.534 kg).  Physical Exam Physical Exam  Constitutional: She is oriented to person, place, and time. She appears well-developed and well-nourished.  Pulmonary/Chest:    Left breast incision looks clean and healing well.   Neurological: She is alert and oriented to person, place, and time.  Skin: Skin is warm and dry.    Data Reviewed Pathology showed fat necrosis, no evidence of recurrent tumor.  Assessment    Doing well status post excision of fat necrosis from the right  breast massa plasty site.     Plan    I anticipate that she'll be able to initiate radiation in about 3 weeks.  Followup examination here in 2 weeks.     PCP/Ref. MD: Etheleen Mayhew 04/16/2014, 9:04 PM

## 2014-04-16 DIAGNOSIS — N641 Fat necrosis of breast: Secondary | ICD-10-CM | POA: Insufficient documentation

## 2014-04-29 ENCOUNTER — Encounter: Payer: Self-pay | Admitting: General Surgery

## 2014-04-29 ENCOUNTER — Ambulatory Visit (INDEPENDENT_AMBULATORY_CARE_PROVIDER_SITE_OTHER): Payer: Self-pay | Admitting: General Surgery

## 2014-04-29 VITALS — BP 130/82 | HR 72 | Resp 14 | Ht 64.0 in | Wt 189.0 lb

## 2014-04-29 DIAGNOSIS — N641 Fat necrosis of breast: Secondary | ICD-10-CM

## 2014-04-29 DIAGNOSIS — C50919 Malignant neoplasm of unspecified site of unspecified female breast: Secondary | ICD-10-CM

## 2014-04-29 DIAGNOSIS — C50912 Malignant neoplasm of unspecified site of left female breast: Secondary | ICD-10-CM

## 2014-04-29 NOTE — Patient Instructions (Signed)
Patient to return in two months.

## 2014-04-29 NOTE — Progress Notes (Signed)
Patient ID: Michelle Pitts, female   DOB: 11/30/1950, 63 y.o.   MRN: [id number]  Chief Complaint  Patient presents with  . Routine Post Op    left breast debridement    HPI Michelle Pitts is a 63 y.o. female here today for her post op left breast debridement done on 04/10/14. Patient states she is doing well.  HPI  Past Medical History  Diagnosis Date  . Anxiety   . COPD (chronic obstructive pulmonary disease)     bronchitis  . 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 Cancer 1995    vulvar, Michelle Pitts  . Breast cancer 01/23/2014    Left breast, T1c, N0, M0. ER/PR +; Her 2 neu not overexpressed. Oncotype DX: 13,Low risk.9% over 10 years.     Past Surgical History  Procedure Laterality Date  . Abdominal hysterectomy  1978    partial  . Cholecystectomy    . Bladder suspension  2001  . Rotator cuff repair Right 1995  . Breast biopsy Right 1975, 1985    benign cyst Michelle Pitts) and fibroadenoma(Michelle Pitts)  . Breast surgery Left 01/23/14    wide excision   . Breast surgery  04/10/14    Debridement    Family History  Problem Relation Age of Onset  . Diabetes Father   . COPD Mother   . Cancer Other     great great paternal aunt, ? age  . Cancer Maternal Grandfather 69    ? source  . Cancer Paternal Grandfather 2    ? source    Social History History  Substance Use Topics  . Smoking status: Current Every Day Smoker -- 1.00 packs/day    Types: Cigarettes  . Smokeless tobacco: Never Used  . Alcohol Use: Yes     Comment: 2/day    Allergies  Allergen Reactions  . Codeine Nausea And Vomiting  . Levaquin [Levofloxacin In D5w] Hives    Current Outpatient Prescriptions  Medication Sig Dispense Refill  . ADVAIR DISKUS 250-50 MCG/DOSE AEPB Inhale 1 puff into the lungs as needed.       . ALPRAZolam (XANAX XR) 0.5 MG 24 hr tablet Take 0.5 mg by mouth as needed for anxiety.      . CHANTIX STARTING MONTH PAK 0.5 MG X 11 & 1 MG X 42 tablet Take 50 mg by mouth 2 (two) times  daily.       . metoprolol succinate (TOPROL-XL) 25 MG 24 hr tablet       . VENTOLIN HFA 108 (90 BASE) MCG/ACT inhaler Inhale 1 puff into the lungs 4 (four) times daily.        No current facility-administered medications for this visit.    Review of Systems Review of Systems  Constitutional: Negative.   Respiratory: Negative.   Cardiovascular: Negative.     Blood pressure 130/82, pulse 72, resp. rate 14, height 5\' 4"  (1.626 m), weight 189 lb (85.73 kg).  Physical Exam Physical Exam  Constitutional: She is oriented to person, place, and time. She appears well-developed and well-nourished.  Pulmonary/Chest:    Neurological: She is alert and oriented to person, place, and time.  Skin: Skin is warm and dry.  Left breast incision looks clean and healing well   Data Reviewed Resected tissue showed evidence of fat necrosis.  Assessment    Doing well status post resection left breast fat necrosis.    Plan    The patient is concerned that she will need to undergo repeat stimulation  prior to radiation therapy, and with a recent birthdate moving her from Michelle Pitts to Michelle Pitts, this means a new deductible  payment. I've spoken with Michelle Pitts in this regard.  We'll plan for a followup examination in 2 months.  The patient requested a copy of the ECG completed prior to her recent surgery. This was provided. She was also given him back a copy of her Oncotype DX testing .    PCP: Michelle Pitts 04/29/2014, 2:44 PM

## 2014-05-05 ENCOUNTER — Ambulatory Visit: Payer: Self-pay | Admitting: Internal Medicine

## 2014-05-15 ENCOUNTER — Ambulatory Visit: Payer: Self-pay | Admitting: Internal Medicine

## 2014-05-26 LAB — CBC CANCER CENTER
BASOS PCT: 1.1 %
Basophil #: 0.1 x10 3/mm (ref 0.0–0.1)
EOS PCT: 2.5 %
Eosinophil #: 0.1 x10 3/mm (ref 0.0–0.7)
HCT: 40 % (ref 35.0–47.0)
HGB: 13.4 g/dL (ref 12.0–16.0)
Lymphocyte #: 1.8 x10 3/mm (ref 1.0–3.6)
Lymphocyte %: 33 %
MCH: 31.4 pg (ref 26.0–34.0)
MCHC: 33.4 g/dL (ref 32.0–36.0)
MCV: 94 fL (ref 80–100)
Monocyte #: 0.3 x10 3/mm (ref 0.2–0.9)
Monocyte %: 6 %
NEUTROS ABS: 3.1 x10 3/mm (ref 1.4–6.5)
Neutrophil %: 57.4 %
Platelet: 242 x10 3/mm (ref 150–440)
RBC: 4.26 10*6/uL (ref 3.80–5.20)
RDW: 12.6 % (ref 11.5–14.5)
WBC: 5.4 x10 3/mm (ref 3.6–11.0)

## 2014-06-02 LAB — CBC CANCER CENTER
Basophil #: 0 x10 3/mm (ref 0.0–0.1)
Basophil %: 0.9 %
EOS ABS: 0.1 x10 3/mm (ref 0.0–0.7)
Eosinophil %: 1.7 %
HCT: 40.5 % (ref 35.0–47.0)
HGB: 13.4 g/dL (ref 12.0–16.0)
LYMPHS ABS: 1.6 x10 3/mm (ref 1.0–3.6)
LYMPHS PCT: 31.4 %
MCH: 31.6 pg (ref 26.0–34.0)
MCHC: 33.2 g/dL (ref 32.0–36.0)
MCV: 95 fL (ref 80–100)
Monocyte #: 0.2 x10 3/mm (ref 0.2–0.9)
Monocyte %: 4.4 %
NEUTROS ABS: 3.1 x10 3/mm (ref 1.4–6.5)
Neutrophil %: 61.6 %
PLATELETS: 255 x10 3/mm (ref 150–440)
RBC: 4.25 10*6/uL (ref 3.80–5.20)
RDW: 13.1 % (ref 11.5–14.5)
WBC: 5.1 x10 3/mm (ref 3.6–11.0)

## 2014-06-09 LAB — CBC CANCER CENTER
BASOS PCT: 1.4 %
Basophil #: 0.1 x10 3/mm (ref 0.0–0.1)
EOS PCT: 1.7 %
Eosinophil #: 0.1 x10 3/mm (ref 0.0–0.7)
HCT: 40.3 % (ref 35.0–47.0)
HGB: 13.3 g/dL (ref 12.0–16.0)
Lymphocyte #: 1.5 x10 3/mm (ref 1.0–3.6)
Lymphocyte %: 30.5 %
MCH: 31.3 pg (ref 26.0–34.0)
MCHC: 33 g/dL (ref 32.0–36.0)
MCV: 95 fL (ref 80–100)
MONO ABS: 0.3 x10 3/mm (ref 0.2–0.9)
MONOS PCT: 6.7 %
NEUTROS ABS: 3 x10 3/mm (ref 1.4–6.5)
Neutrophil %: 59.7 %
PLATELETS: 249 x10 3/mm (ref 150–440)
RBC: 4.24 10*6/uL (ref 3.80–5.20)
RDW: 12.8 % (ref 11.5–14.5)
WBC: 5 x10 3/mm (ref 3.6–11.0)

## 2014-06-15 ENCOUNTER — Ambulatory Visit: Payer: Self-pay | Admitting: Internal Medicine

## 2014-06-16 ENCOUNTER — Encounter: Payer: Self-pay | Admitting: General Surgery

## 2014-06-16 LAB — CBC CANCER CENTER
BASOS PCT: 1 %
Basophil #: 0 x10 3/mm (ref 0.0–0.1)
EOS ABS: 0.1 x10 3/mm (ref 0.0–0.7)
Eosinophil %: 1.1 %
HCT: 40.3 % (ref 35.0–47.0)
HGB: 13.5 g/dL (ref 12.0–16.0)
Lymphocyte #: 1.3 x10 3/mm (ref 1.0–3.6)
Lymphocyte %: 25.7 %
MCH: 31.9 pg (ref 26.0–34.0)
MCHC: 33.6 g/dL (ref 32.0–36.0)
MCV: 95 fL (ref 80–100)
MONO ABS: 0.5 x10 3/mm (ref 0.2–0.9)
MONOS PCT: 8.9 %
NEUTROS PCT: 63.3 %
Neutrophil #: 3.3 x10 3/mm (ref 1.4–6.5)
Platelet: 251 x10 3/mm (ref 150–440)
RBC: 4.24 10*6/uL (ref 3.80–5.20)
RDW: 12.9 % (ref 11.5–14.5)
WBC: 5.2 x10 3/mm (ref 3.6–11.0)

## 2014-06-23 LAB — CBC CANCER CENTER
BASOS ABS: 0 x10 3/mm (ref 0.0–0.1)
Basophil %: 0.9 %
Eosinophil #: 0.1 x10 3/mm (ref 0.0–0.7)
Eosinophil %: 1.2 %
HCT: 40.2 % (ref 35.0–47.0)
HGB: 13.6 g/dL (ref 12.0–16.0)
LYMPHS ABS: 1.2 x10 3/mm (ref 1.0–3.6)
LYMPHS PCT: 26.4 %
MCH: 32 pg (ref 26.0–34.0)
MCHC: 33.7 g/dL (ref 32.0–36.0)
MCV: 95 fL (ref 80–100)
Monocyte #: 0.3 x10 3/mm (ref 0.2–0.9)
Monocyte %: 6.8 %
Neutrophil #: 3.1 x10 3/mm (ref 1.4–6.5)
Neutrophil %: 64.7 %
PLATELETS: 244 x10 3/mm (ref 150–440)
RBC: 4.24 10*6/uL (ref 3.80–5.20)
RDW: 13.1 % (ref 11.5–14.5)
WBC: 4.7 x10 3/mm (ref 3.6–11.0)

## 2014-06-30 ENCOUNTER — Encounter: Payer: Self-pay | Admitting: General Surgery

## 2014-06-30 ENCOUNTER — Ambulatory Visit (INDEPENDENT_AMBULATORY_CARE_PROVIDER_SITE_OTHER): Payer: Self-pay | Admitting: General Surgery

## 2014-06-30 VITALS — BP 132/70 | HR 74 | Resp 12 | Ht 64.0 in | Wt 178.0 lb

## 2014-06-30 DIAGNOSIS — C50912 Malignant neoplasm of unspecified site of left female breast: Secondary | ICD-10-CM

## 2014-06-30 LAB — CBC CANCER CENTER
BASOS ABS: 0 x10 3/mm (ref 0.0–0.1)
BASOS PCT: 0.9 %
EOS PCT: 1.2 %
Eosinophil #: 0.1 x10 3/mm (ref 0.0–0.7)
HCT: 38.6 % (ref 35.0–47.0)
HGB: 12.9 g/dL (ref 12.0–16.0)
Lymphocyte #: 1.2 x10 3/mm (ref 1.0–3.6)
Lymphocyte %: 28 %
MCH: 32 pg (ref 26.0–34.0)
MCHC: 33.3 g/dL (ref 32.0–36.0)
MCV: 96 fL (ref 80–100)
MONOS PCT: 9.9 %
Monocyte #: 0.4 x10 3/mm (ref 0.2–0.9)
NEUTROS PCT: 60 %
Neutrophil #: 2.6 x10 3/mm (ref 1.4–6.5)
Platelet: 242 x10 3/mm (ref 150–440)
RBC: 4.02 10*6/uL (ref 3.80–5.20)
RDW: 13.2 % (ref 11.5–14.5)
WBC: 4.3 x10 3/mm (ref 3.6–11.0)

## 2014-06-30 NOTE — Patient Instructions (Signed)
Patient to return in two months.

## 2014-06-30 NOTE — Progress Notes (Signed)
Patient ID: Michelle Pitts, female   DOB: 10-Nov-1950, 63 y.o.   MRN: [id number]  Chief Complaint  Patient presents with  . Follow-up    left breast wide excision    HPI Michelle Pitts is a 63 y.o. female here today following up from left breast surgery done in January 23, 2014. Patient states she is doing well. HPI  Past Medical History  Diagnosis Date  . Anxiety   . COPD (chronic obstructive pulmonary disease)     bronchitis  . 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 Cancer 1995    vulvar, [facility]  . Breast cancer 01/23/2014    Left breast, T1c, N0, M0. ER/PR +; Her 2 neu not overexpressed. Oncotype DX: 13,Low risk.9% over 10 years.     Past Surgical History  Procedure Laterality Date  . Abdominal hysterectomy  1978    partial  . Cholecystectomy    . Bladder suspension  2001  . Rotator cuff repair Right 1995  . Breast biopsy Right 1975, 1985    benign cyst [name]) and fibroadenoma([facility])  . Breast surgery Left 01/23/14    wide excision   . Breast surgery  04/10/14    Debridement    Family History  Problem Relation Age of Onset  . Diabetes Father   . COPD Mother   . Cancer Other     great great paternal aunt, ? age  . Cancer Maternal Grandfather 25    ? source  . Cancer Paternal Grandfather 62    ? source    Social History History  Substance Use Topics  . Smoking status: Current Every Day Smoker -- 1.00 packs/day    Types: Cigarettes  . Smokeless tobacco: Never Used  . Alcohol Use: Yes     Comment: 2/day    Allergies  Allergen Reactions  . Codeine Nausea And Vomiting  . Levaquin [Levofloxacin In D5w] Hives    Current Outpatient Prescriptions  Medication Sig Dispense Refill  . ADVAIR DISKUS 250-50 MCG/DOSE AEPB Inhale 1 puff into the lungs as needed.     . ALPRAZolam (XANAX XR) 0.5 MG 24 hr tablet Take 0.5 mg by mouth as needed for anxiety.    . CHANTIX STARTING MONTH PAK 0.5 MG X 11 & 1 MG X 42 tablet Take 50 mg by mouth 2 (two) times daily.      . metoprolol succinate (TOPROL-XL) 25 MG 24 hr tablet     . VENTOLIN HFA 108 (90 BASE) MCG/ACT inhaler Inhale 1 puff into the lungs 4 (four) times daily.      No current facility-administered medications for this visit.    Review of Systems Review of Systems  Constitutional: Negative.   Respiratory: Negative.   Cardiovascular: Negative.     Blood pressure 132/70, pulse 74, resp. rate 12, height 5\' 4"  (1.626 m), weight 178 lb (80.74 kg).  Physical Exam Physical Exam  Constitutional: She is oriented to person, place, and time. She appears well-developed and well-nourished.  Eyes: Conjunctivae are normal. No scleral icterus.  Neck: Neck supple.  Cardiovascular: Normal rate, regular rhythm and normal heart sounds.   Pulmonary/Chest: Effort normal and breath sounds normal.    Left breast redness do to radiation. Superficial epithelial loss on the lower outer quadrant and along the inframammary fold.  Lymphadenopathy:    She has no cervical adenopathy.  Neurological: She is alert and oriented to person, place, and time.  Skin: Skin is warm and dry.  Assessment    Tolerating radiation therapy fairly well. Modest discomfort.     Plan   Patient to return in two months. Will discuss initiation of antiestrogen therapy at that time.     PCP:  Mitzi Hansen, Maikayla Beggs 06/30/2014, 1:15 PM

## 2014-07-14 LAB — COMPREHENSIVE METABOLIC PANEL
ALK PHOS: 165 U/L — AB
ALT: 57 U/L
AST: 40 U/L — AB (ref 15–37)
Albumin: 3.9 g/dL (ref 3.4–5.0)
Anion Gap: 7 (ref 7–16)
BUN: 11 mg/dL (ref 7–18)
Bilirubin,Total: 0.4 mg/dL (ref 0.2–1.0)
CREATININE: 0.96 mg/dL (ref 0.60–1.30)
Calcium, Total: 9.7 mg/dL (ref 8.5–10.1)
Chloride: 102 mmol/L (ref 98–107)
Co2: 29 mmol/L (ref 21–32)
EGFR (Non-African Amer.): >60
Glucose: 153 mg/dL — ABNORMAL HIGH (ref 65–99)
Osmolality: 278 (ref 275–301)
Potassium: 4.8 mmol/L (ref 3.5–5.1)
SODIUM: 138 mmol/L (ref 136–145)
Total Protein: 7.4 g/dL (ref 6.4–8.2)

## 2014-07-14 LAB — CBC CANCER CENTER
BASOS ABS: 0 x10 3/mm (ref 0.0–0.1)
Basophil %: 0.6 %
EOS PCT: 0.6 %
Eosinophil #: 0 x10 3/mm (ref 0.0–0.7)
HCT: 39.6 % (ref 35.0–47.0)
HGB: 13.2 g/dL (ref 12.0–16.0)
LYMPHS ABS: 1.1 x10 3/mm (ref 1.0–3.6)
Lymphocyte %: 19.2 %
MCH: 32 pg (ref 26.0–34.0)
MCHC: 33.4 g/dL (ref 32.0–36.0)
MCV: 96 fL (ref 80–100)
Monocyte #: 0.4 x10 3/mm (ref 0.2–0.9)
Monocyte %: 6.5 %
NEUTROS ABS: 4.1 x10 3/mm (ref 1.4–6.5)
Neutrophil %: 73.1 %
Platelet: 256 x10 3/mm (ref 150–440)
RBC: 4.13 10*6/uL (ref 3.80–5.20)
RDW: 13 % (ref 11.5–14.5)
WBC: 5.6 x10 3/mm (ref 3.6–11.0)

## 2014-07-15 ENCOUNTER — Ambulatory Visit: Payer: Self-pay | Admitting: Hematology and Oncology

## 2014-07-23 ENCOUNTER — Ambulatory Visit: Payer: Self-pay | Admitting: Hospice and Palliative Medicine

## 2014-08-20 ENCOUNTER — Ambulatory Visit: Payer: Self-pay | Admitting: Hematology and Oncology

## 2014-09-01 ENCOUNTER — Ambulatory Visit (INDEPENDENT_AMBULATORY_CARE_PROVIDER_SITE_OTHER): Payer: Medicare Other | Admitting: General Surgery

## 2014-09-01 ENCOUNTER — Encounter: Payer: Self-pay | Admitting: General Surgery

## 2014-09-01 VITALS — BP 148/78 | HR 89 | Resp 16 | Ht 65.0 in | Wt 211.0 lb

## 2014-09-01 DIAGNOSIS — N641 Fat necrosis of breast: Secondary | ICD-10-CM

## 2014-09-01 DIAGNOSIS — C50912 Malignant neoplasm of unspecified site of left female breast: Secondary | ICD-10-CM

## 2014-09-01 NOTE — Patient Instructions (Signed)
Continue self breast exams. Call office for any new breast issues or concerns. Return in June.

## 2014-09-01 NOTE — Progress Notes (Signed)
Patient ID: Michelle Pitts, female   DOB: 05/17/1951, 64 y.o.   MRN: [id number]  Chief Complaint  Patient presents with  . Follow-up    Breast Cancer    HPI Michelle Pitts is a 64 y.o. female  For a follow up for breast cancer. She states that she is doing well. She has a concern about an area that feels like a dime size mass under her left arm above the area of her sentinel node biopsy. She reports some discomfort described as a pulling sensation when she raises her left arm, and stops when she lowers it. She first noticed this about 3 days ago. She states that she completed her radiation 07/14/14. HPI  Past Medical History  Diagnosis Date  . Anxiety   . COPD (chronic obstructive pulmonary disease)     bronchitis  . 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 Cancer 1995    vulvar, [facility]  . Breast cancer 01/23/2014    Left breast, T1c, N0, M0. ER/PR +; Her 2 neu not overexpressed. Oncotype DX: 13,Low risk.9% over 10 years.     Past Surgical History  Procedure Laterality Date  . Abdominal hysterectomy  1978    partial  . Cholecystectomy    . Bladder suspension  2001  . Rotator cuff repair Right 1995  . Breast biopsy Right 1975, 1985    benign cyst [name]) and fibroadenoma([facility])  . Breast surgery Left 01/23/14    wide excision   . Breast surgery  04/10/14    Debridement  . Wide excision debriedment Left 04/10/14    Family History  Problem Relation Age of Onset  . Diabetes Father   . COPD Mother   . Cancer Other     great great paternal aunt, ? age  . Cancer Maternal Grandfather 40    ? source  . Cancer Paternal Grandfather 43    ? source    Social History History  Substance Use Topics  . Smoking status: Current Every Day Smoker -- 1.00 packs/day    Types: Cigarettes  . Smokeless tobacco: Never Used  . Alcohol Use: Yes     Comment: 2/day    Allergies  Allergen Reactions  . Codeine Nausea And Vomiting  . Levaquin [Levofloxacin In D5w] Hives     Current Outpatient Prescriptions  Medication Sig Dispense Refill  . ADVAIR DISKUS 250-50 MCG/DOSE AEPB Inhale 1 puff into the lungs as needed.     . ALPRAZolam (XANAX XR) 0.5 MG 24 hr tablet Take 0.5 mg by mouth as needed for anxiety.    . CHANTIX STARTING MONTH PAK 0.5 MG X 11 & 1 MG X 42 tablet Take 50 mg by mouth 2 (two) times daily.     . metoprolol succinate (TOPROL-XL) 25 MG 24 hr tablet     . VENTOLIN HFA 108 (90 BASE) MCG/ACT inhaler Inhale 1 puff into the lungs 4 (four) times daily.     Marland Kitchen letrozole (FEMARA) 2.5 MG tablet Take 2.5 mg by mouth daily.  0   No current facility-administered medications for this visit.    Review of Systems Review of Systems  Constitutional: Negative.   Respiratory: Negative.   Cardiovascular: Negative.     Blood pressure 148/78, pulse 89, resp. rate 16, height 5\' 5"  (1.651 m), weight 211 lb (95.709 kg).  Physical Exam Physical Exam  Constitutional: She is oriented to person, place, and time. She appears well-developed and well-nourished.  Eyes: Conjunctivae are normal. No scleral icterus.  Neck: Neck supple.  Cardiovascular: Normal rate, regular rhythm and normal heart sounds.   Pulmonary/Chest: Effort normal and breath sounds normal. Right breast exhibits no inverted nipple, no mass, no nipple discharge, no skin change and no tenderness. Left breast exhibits no inverted nipple, no mass, no nipple discharge, no skin change and no tenderness.  Well healed incision lower inner quadrant left breast.  Lymphadenopathy:    She has no cervical adenopathy.  Neurological: She is alert and oriented to person, place, and time.      Assessment    Doing well status post wide excision, debridement and whole breast radiation    Plan    We'll arrange for a follow-up exam and bilateral diagnostic mammograms in June 2016.       Robert Bellow 09/02/2014, 8:00 PM

## 2014-10-21 ENCOUNTER — Ambulatory Visit
Admit: 2014-10-21 | Disposition: A | Discharge status: Home / Self Care | Payer: Self-pay | Attending: Hematology and Oncology | Admitting: Hematology and Oncology

## 2014-11-14 ENCOUNTER — Ambulatory Visit
Admit: 2014-11-14 | Disposition: A | Discharge status: Home / Self Care | Payer: Self-pay | Attending: Family Medicine | Admitting: Family Medicine

## 2014-11-14 ENCOUNTER — Ambulatory Visit
Admit: 2014-11-14 | Disposition: A | Discharge status: Home / Self Care | Payer: Self-pay | Attending: Hematology and Oncology | Admitting: Hematology and Oncology

## 2014-11-28 ENCOUNTER — Other Ambulatory Visit: Payer: Self-pay | Admitting: Hematology and Oncology

## 2014-11-28 DIAGNOSIS — Z853 Personal history of malignant neoplasm of breast: Secondary | ICD-10-CM

## 2014-12-06 NOTE — Consult Note (Signed)
Reason for Visit: This 64 year old Female patient presents to the clinic for initial evaluation of  breast cancer .   Referred by Dr. Bary Castilla.  Diagnosis:  Chief Complaint/Diagnosis   64 year old female with a 1.3 cm invasive mammary carcinoma strongly ER/PR positive HER-2/neu negative patient deciding between mastectomy and lumpectomy plus radiation as yet not completely staged.  Pathology Report pathology report reviewed   Imaging Report mammograms reviewed   Referral Report clinical notes reviewed   Planned Treatment Regimen possible whole breast radiation   HPI   patient is a 64 year old female presents with an abnormal mammogram in April 2015 accompanied indentation of the left breast in the lower inner quadrant. Initial mammogram showed a 1.4 x 1.5 cm mass hypoechoic on ultrasound with axillary ultrasound negative. Patient was seen by the surgical oncology underwent wide local excision for a 1.3 cm invasive mammary carcinoma ER/PR positive HER-2/neu is not overexpressed. This was extremely close to the chest wall. Patient has been debating about mastectomy versus lumpectomy has some concerns about radiation exposure to her heart. She otherwise is doing well. She specifically denies breast tenderness cough or bone pain.patient did have an excision of a lesion in the right breast also which was an intraductal papilloma with no atypia or malignancy noted.  Past Hx:    COPD:    Anemia: Took oral Iron and B 12 inj until 1980   Breast Cancer:    Vulvar Cancer:    Anxiety:    Breast biopsy:    Rotator Cuff Surgery:    Bladder Suspension:    Cholecystectomy:    Hysterectomy - Partial:   Past, Family and Social History:  Past Medical History positive   Respiratory COPD   Cancer vulvar cancer   Neurological/Psychiatric anxiety   Past Surgical History hysterectomy, bladder suspension, rotator cuff surgery   Past Medical History Comments anemia   Family History  noncontributory   Social History positive   Social History Comments greater than 50-pack-year smoking history, no EtOH use history   Additional Past Medical and Surgical History seen accompanied by nurse Navigator today   Allergies:   Codeine: Unknown  Tape: Unknown  Levaquin: Hives  Home Meds:  Home Medications: Medication Instructions Status  Advair Diskus 250 mcg-50 mcg inhalation powder 1 puff(s) inhaled 2 times a day Active  ALPRAZolam 0.5 mg oral tablet, extended release 1 tab(s) orally once a day (in the morning), As Needed Active  Chantix 0.5 mg-1 mg oral tablet  orally  Active  Ventolin HFA CFC free 90 mcg/inh inhalation aerosol 1 puff(s) inhaled 4 times a day, As Needed - for Shortness of Breath Active   Review of Systems:  General negative   Performance Status (ECOG) 0   Skin negative   Breast see HPI   Ophthalmologic negative   ENMT negative   Respiratory and Thorax negative   Cardiovascular negative   Gastrointestinal negative   Genitourinary negative   Musculoskeletal negative   Neurological negative   Psychiatric negative   Hematology/Lymphatics negative   Endocrine negative   Allergic/Immunologic negative   Review of Systems   review of systems obtained from nurses notes  Nursing Notes:  Nursing Vital Signs and Chemo Nursing Nursing Notes: *CC Vital Signs Flowsheet:   04-Jun-15 14:07  Temp Temperature 97.6  Pulse Pulse 96  Respirations Respirations 20  SBP SBP 152  DBP DBP 85  Pain Scale (0-10)  0  Current Weight (kg) (kg) 91.2  Height (cm) centimeters 162.6  BSA (  m2) 1.9   Physical Exam:  General/Skin/HEENT:  General normal   Skin normal   Eyes normal   ENMT normal   Head and Neck normal   Additional PE well-developed obese female in NAD. Breasts are large pendulous. She is a wide local excision site in the inferior portionof her left breast with some indentation of the skin noted. No dominant mass or nodularity is  noted otherwise in either breast in 2 positions examined. No axillary or supraclavicular adenopathy is noted. Lungs are clear to A&P cardiac examination shows regular rate and rhythm.   Breasts/Resp/CV/GI/GU:  Respiratory and Thorax normal   Cardiovascular normal   Gastrointestinal normal   Genitourinary normal   MS/Neuro/Psych/Lymph:  Musculoskeletal normal   Neurological normal   Lymphatics normal   LAB Results:  Laboratory Results: Pathology:  22-Aug-14 00:12   Pathology Report ========== TEST NAME ==========  ========= RESULTS =========  = REFERENCE RANGE =  PATHOLOGY REPORT  Pathology Report .                               [   Final Report         ]                   Material submitted:         Marland Kitchen PART A: ASCENDING COLON POLYP X 2 HOT SNARED/COLD BIOPSY PART B: DESCENDING COLON POLYP X 3 COLD BIOPSY .                               [   Final Report         ]                   Pre-operative diagnosis:         . SCREENING COLON .                               [   Final Report         ]                   ********************************************************************** Diagnosis: Part A: ASCENDING COLON POLYP X 2 HOT SNARED/COLD BIOPSY: - TUBULAR ADENOMA (MULTIPLE FRAGMENTS). - NEGATIVE FOR HIGH GRADE DYSPLASIA AND MALIGNANCY. . Part B: DESCENDING COLON POLYP X 3 COLD BIOPSY: - TUBULAR ADENOMA (2). - HYPERPLASTIC POLYP (1). - NEGATIVE FOR HIGH GRADE DYSPLASIA AND MALIGNANCY. . . XDB/04/08/2013 ********************************************************************** .                               [   Final Report         ]                   Electronically signed:                                     . Lum Babe, MD, Pathologist .                               [   Final Report         ]  Gross description:                                         . A. Received in a formalin filled container labeled Riki Rusk and ascending colon polyp  hotsnare and cold biopsy (2 polyps) are multiple tan pink soft tissue fragments ranging from <0.1 to 0.4 cm in greatest dimensions. The smallest fragments may not survive processing. Entirely submitted in cassette 1. . B. Received in a formalin filled container labeled Riki Rusk and descending colon polyp cold biopsy x 3 are four tan pink soft tissue fragments ranging from 0.3 to 0.5 cm in greatest dimensions. Entirely submitted in cassette 1. QAC/KCT . [   Final Report         ]                   Pathologist provided ICD-9: 211.3, 211.3, 211.3 .                               [   Final Report         ]                   CPT                                                        . [id number], [place]            No: [id number]           [address] [place], [place] [address]           Lindon Romp, [place]                                         [id number]   Result(s) reported on 08 Apr 2013 at 11:53AM.   Other Results:  Radiology Results: LabUnknown:    21-May-15 14:39, Digital Diagnostic Mammogram Bilateral  PACS [place]:  Digital Diagnostic Mammogram Bilateral   REASON FOR EXAM:    LEFT BREAST MASS 8:00 AND YEARLY  COMMENTS:       PROCEDURE: MAM - MAM DGTL DIAGNOSTIC MAMMO W/CAD  - Jan 02 2014  2:39PM     CLINICAL DATA:  The patient has noted in indentation and possible  mass in the lower inner quadrant of the left breast.    EXAM:  DIGITAL DIAGNOSTIC  BILATERAL MAMMOGRAM WITH CAD    ULTRASOUND BILATERAL BREAST    COMPARISON:  12/25/2012 and earlier  ACR Breast Density Category b: There are scattered areas of  fibroglandular density.    FINDINGS:  There is a skin fold in the lower inner quadrant of the left breast,  marked with a BB as the area of patient's concern. No definite mass  identified within this region mammographically. Further evaluation  is performed  with ultrasound.    Within the lateral  central portion of the right breast there is a  lobulated sub cm nodule confirmed with spot compression views and  further evaluated with ultrasound.    Mammographic images were processed with CAD.  On physical exam, there is a visible indentation in the 8 o'clock  location of the left breast 11 cm from the nipple. I palpate focal  thickening in this region. Also palpate thickening along the medial  aspect of the inframammary fold without discrete mass in this  location.    I palpate no abnormality in the lateral central portion of the right  breast.    Ultrasound is performed, showing an irregular hypoechoic mass in the  8 o'clock location of the left breast 11 cm from the nipple. This is  associated with increased blood flow in measures 2.3 x 1.4 x 1.5 cm.  There is associated acoustic shadowing. Evaluation of the left  axilla is negative for adenopathy.  In the 9 o'clock location of the right breast 3 cm from the nipple  there is a small irregular hypoechoic nodule with internal blood  flow which measures 6 mm in diameter. Evaluation of the right axilla  is negative for adenopathy.     IMPRESSION:  1. Irregular hypoechoic mass in the 8 o'clock location of the left  breast is suspicious for malignancy and warrants biopsy.  2. Smallirregular mass in the 9 o'clock location of the right  breast is indeterminate and warrants biopsy.    RECOMMENDATION:  1. Ultrasound-guided core biopsy recommended of the mass in the 8  o'clock location of the left breast.  2. Ultrasound-guided corebiopsy is recommended of the mass in the 9  o'clock location of the right breast.  3. The patient is scheduled to see Dr. Bary Castilla.    I have discussed the findings and recommendations with the patient.  Results were also provided in writing at the conclusion of the  visit. If applicable, a reminder letter will be sent to the patient  regarding the next  appointment.    BI-RADS CATEGORY  4: Suspicious.      Electronically Signed    By: Shon Hale M.D.    On: 01/02/2014 16:07     Verified By: Glenice Bow, M.D.,   Relevent Results:   Relevant Scans and Labs mammogram and ultrasound reviewed   Assessment and Plan: Impression:   probable stage I breast cancer as yet not pathologically staged to undergo either mastectomy or wide local excision andsentinel node biopsy. If nodes are negative patient may have Oncotype DX to determine feasibility of chemotherapy. I had a long discussion with the patient today on cardiac risk radiation. I have gone over studies showing there is no difference in overall survival of right breast versus left breast with modern day radiation therapy techniques. I reassured her our main goal is to spare the heart any unnecessary radiation to the best of our ability. I also showed her that cardiac risk from left-sided breast radiation in modern times with CT simulation shows an extremely small risk of cardiac toxicity. Patient will digest all of my clinical information to make a decision versus mastectomy and lumpectomy plus radiation. We'll make final recommendations after she decides on her surgical procedure.  I would like to take this opportunity for allowing me to participate in the care of your patient..  Electronic Signatures: Armstead Peaks (MD)  (Signed 04-Jun-15 15:20)  Authored: HPI, Diagnosis, Past Hx, PFSH, Allergies, Home Meds, ROS, Nursing Notes, Physical Exam, LAB  Results, Other Results, Relevent Results, Encounter Assessment and Plan   Last Updated: 04-Jun-15 15:20 by Armstead Peaks (MD)

## 2014-12-06 NOTE — Op Note (Signed)
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   OPERATIVE PROCEDURE: Debridement of left breast with primary closure.   SURGEON: Hervey Ard, M.D.   ANESTHESIA: General by LMA, Marcaine 0.5% plain, 30 mL local infiltration.   ESTIMATED BLOOD LOSS:  Minimal.    CLINICAL NOTE:  This is a 64 year old woman who underwent wide excision and mastoplasty two months ago for management of a very peripherally based lesion in the inferior medial aspect of the left breast. She has developed a chronic wound with evidence of fat necrosis. She was felt to be a candidate for debridement and primary closure.   OPERATIVE NOTE: With the patient under adequate general anesthesia, the breast was prepped with Betadine scrub and solution and draped. Marcaine was infiltrated for postoperative analgesia. An ellipse of skin was outlined in the medial lateral aspect of the original incision to include all of the hard tissue clinically evident. The incision was made sharply and the remaining dissection completed with electrocautery. The main mass of the inflammatory tissue was orientated and sent in formalin for routine histology. Smaller areas inferiorly and medially to this site  were resected and sent for routine histology. There was no evidence of infection. Exposing all healthy fat, the deep tissues were mobilized and the fat gently approximated with interrupted 2-0 Vicryl figure-of-eight sutures. The skin was mobilized off the underlying fat to allow tension-free closure with a running 4-0 Vicryl subcuticular suture. Benzoin and Steri-Strips were applied followed by Fluff gauze and the patient's own bra.   The patient tolerated the procedure well and was taken to the recovery room in  stable condition.     ____________________________ Robert Bellow, MD jwb:nr D: 04/10/2014 21:20:00 ET T: 04/10/2014 22:59:44 ET JOB#: [id number]  cc: Robert Bellow, MD, <Dictator> Jerrell Belfast, MD Armstead Peaks, MD Montee Tallman Amedeo Kinsman MD ELECTRONICALLY SIGNED 04/11/2014 9:04

## 2014-12-06 NOTE — Op Note (Signed)
PATIENT NAME:  Michelle Pitts, Michelle Pitts MR#:  [id number] DATE OF BIRTH:  Dec 27, 1950  DATE OF PROCEDURE:  01/23/2014  PREOPERATIVE DIAGNOSIS: Left breast cancer.   POSTOPERATIVE DIAGNOSIS: Left breast cancer.    OPERATIVE PROCEDURE: Left breast wide excision, mastoplasty and sentinel node biopsy.   SURGEON: Hervey Ard, M.D.   ANESTHESIA: General by LMA under Dr. Marcello Moores, Marcaine 0.5% with 1:200,000 units of epinephrine, 30 mL local infiltration.   ESTIMATED BLOOD LOSS: Less than 50 mL.   CLINICAL NOTE: This 64 year old woman had noticed faint dimpling at the inframammary fold of the left breast at the 7 to 8 o'clock position. This became more pronounced over the last year. Mammography showed an ill-defined density, and subsequent core biopsy without clip placement showed invasive mammary carcinoma. The patient desired breast conservation. She underwent injection with technetium sulfur colloid the morning of surgery.   After the induction of general anesthesia, the breast was prepped with alcohol, and a total of 4 mL of a mixture of normal saline/methylene blue in a 2:1 dilution was made in the subareolar plexus. The breast, chest and axilla was then prepped with ChloraPrep and draped. Gamma finder was used to identify an area of increased uptake in the left axilla. Marcaine was infiltrated for postoperative analgesia and hemostasis, and a transverse incision made at the base of the axillary hairline. Skin and subcutaneous tissue was divided sharply and hemostasis achieved by electrocautery. Two hot blue nodes were identified as well as 1 cold node. The hot nodes were sent for frozen section. Three individual nodes were reported as negative for macrometastatic disease. The non-hot node was sent for routine histology. The wound was closed with 2-0 Vicryl figure-of-eight suture, followed by running 4-0 Vicryl subcuticular sutures in the skin.   Attention was then turned to the primary tumor which was  in an extremely peripheral location at the inframammary fold at the 7 to 8 o'clock position. Ultrasound was used to identify the margins, and due to its close location to the overlying dermis, it was necessary to resect skin with this as well. An ellipse was made which was radially orientated and extended to the edge of the areola. The skin was incised sharply and the remaining dissection completed with electrocautery. Hemostasis was with 3-0 Vicryl ties as needed. A single 3-0 Vicryl suture ligature was used for a small bleeding site within the pectoralis muscle. There was very little soft tissue between the tumor and the pectoralis fascia, and the muscle in this area was excised with the specimen. After the specimen was removed, it was orientated and specimen radiograph showed the tumor mass close to the cephalad border. This was confirmed on pathologic review. An additional 5 mm of skin and subcutaneous tissue at the tumor site was excised sharply. The new cephalad margin was placed against a Telfa pad, the specimen orientated and sent in formalin for routine histology. The defect was generous, and it was necessary to mobilize the pectoralis fascia cephalad to the 11 o'clock position across the sternum and to elevate the rectus fascia off the underlying muscle. This could then be approximated with interrupted 2-0 Vicryl figure-of-eight sutures. The adipose tissue was approximated in multiple layers in a similar fashion. Skin flaps were made approximately 5 mm in thickness, and after these had been extended for 5 to 6 cm, it was possible to approximate the skin with a running 2-0 Vicryl deep dermal suture. Benzoin and Steri-Strips were applied. Of note, Marcaine had been instilled in the surgical  site prior to wide excision. Benzoin and Steri-Strips were applied to both wounds, followed by a Telfa pad. Fluff gauze, Kerlix and an Ace wrap were then applied.   The patient was taken to the recovery room in stable  condition.   ____________________________ Robert Bellow, MD jwb:gb D: 01/23/2014 21:38:11 ET T: 01/24/2014 00:37:04 ET JOB#: [id number]  cc: Robert Bellow, MD, <Dictator> Jerrell Belfast, MD Rae Halsted Kallie Edward, MD Marciano Mundt Amedeo Kinsman MD ELECTRONICALLY SIGNED 01/27/2014 17:04

## 2015-01-06 ENCOUNTER — Telehealth: Payer: Self-pay | Admitting: General Surgery

## 2015-01-06 NOTE — Telephone Encounter (Signed)
DR MALONEY WOULD LIKE FOR YOU TO CALL HER @ [phone] TO DISCUSS PT WITH  AN ABNORMAL FINDING OF HER LEFT BR.SHE STATED TO YOU COULD CALL @ YOUR EARLIEST CONVENIENCE.

## 2015-01-07 NOTE — Telephone Encounter (Signed)
Message left for patient to call back to schedule an early assessment with Dr Bary Castilla.

## 2015-01-07 NOTE — Telephone Encounter (Signed)
The patient has noted changes on her breast exam, reported during her PCP visit earlier this week. We'll arrange for early reassessment.

## 2015-01-13 ENCOUNTER — Encounter: Payer: Self-pay | Admitting: General Surgery

## 2015-01-13 ENCOUNTER — Ambulatory Visit (INDEPENDENT_AMBULATORY_CARE_PROVIDER_SITE_OTHER): Payer: Medicare Other | Admitting: General Surgery

## 2015-01-13 ENCOUNTER — Other Ambulatory Visit: Payer: Medicare Other

## 2015-01-13 VITALS — BP 134/76 | HR 76 | Resp 12 | Ht 66.0 in | Wt 225.0 lb

## 2015-01-13 DIAGNOSIS — N6320 Unspecified lump in the left breast, unspecified quadrant: Secondary | ICD-10-CM

## 2015-01-13 DIAGNOSIS — N63 Unspecified lump in breast: Secondary | ICD-10-CM | POA: Diagnosis not present

## 2015-01-13 HISTORY — PX: BREAST BIOPSY: SHX20

## 2015-01-13 NOTE — Progress Notes (Signed)
Patient ID: Michelle Pitts, female   DOB: 1951/01/27, 64 y.o.   MRN: [id number]  Chief Complaint  Patient presents with  . Other    left breast lump    HPI Michelle Pitts is a 64 y.o. female here today for a evaluation of a lump in left breast identified on self exam. Patient states she noticed a change in her bra size at the end of March. She saw Dr. Venia Minks for a wellness exam on 01/06/15 and Dr. Venia Minks felt 4 lumps in her left breast . Patient states there is a dull ache in her left breast in the outer quadrant.  The patient discontinued smoking in February of this shear. HPI  Past Medical History  Diagnosis Date  . Anxiety   . COPD (chronic obstructive pulmonary disease)     bronchitis  . 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 Cancer 1995    vulvar, [facility]  . Breast cancer 01/23/2014    Left breast, T1c, N0, M0. ER/PR +; Her 2 neu not overexpressed. Oncotype DX: 13,Low risk.9% over 10 years.     Past Surgical History  Procedure Laterality Date  . Abdominal hysterectomy  1978    partial  . Cholecystectomy    . Bladder suspension  2001  . Rotator cuff repair Right 1995  . Wide excision debriedment Left 04/10/14  . Breast biopsy Right 1975, 1985    benign cyst [name]) and fibroadenoma([facility])  . Breast surgery Left 01/23/14    wide excision   . Breast surgery  04/10/14    Debridement of fat necrosis  . Breast biopsy Right 01/03/2014    Papilloma, no atypia detected on mammography.  . Breast biopsy Left 01/03/2014    Invasive mammary carcinoma.    Family History  Problem Relation Age of Onset  . Diabetes Father   . COPD Mother   . Cancer Other     great great paternal aunt, ? age  . Cancer Maternal Grandfather 46    ? source  . Cancer Paternal Grandfather 48    ? source    Social History History  Substance Use Topics  . Smoking status: Former Smoker -- 1.00 packs/day for 15 years    Types: Cigarettes  . Smokeless tobacco: Never Used  . Alcohol  Use: 0.0 oz/week    0 Standard drinks or equivalent per week     Comment: 2/day    Allergies  Allergen Reactions  . Codeine Nausea And Vomiting  . Levaquin [Levofloxacin In D5w] Hives    Current Outpatient Prescriptions  Medication Sig Dispense Refill  . albuterol (PROVENTIL HFA;VENTOLIN HFA) 108 (90 BASE) MCG/ACT inhaler Inhale 1 puff into the lungs every 6 (six) hours as needed for wheezing or shortness of breath.    . ALPRAZolam (XANAX XR) 0.5 MG 24 hr tablet Take 0.5 mg by mouth as needed for anxiety.    . CHANTIX STARTING MONTH PAK 0.5 MG X 11 & 1 MG X 42 tablet Take 50 mg by mouth 2 (two) times daily.     Marland Kitchen exemestane (AROMASIN) 25 MG tablet   0  . metoprolol succinate (TOPROL-XL) 25 MG 24 hr tablet      No current facility-administered medications for this visit.    Review of Systems Review of Systems  Constitutional: Negative.   Respiratory: Negative.   Cardiovascular: Negative.     Blood pressure 134/76, pulse 76, resp. rate 12, height '5\' 6"'$  (1.676 m), weight 225 lb (102.059 kg).  Physical Exam Physical Exam  Constitutional: She is oriented to person, place, and time. She appears well-developed and well-nourished.  Cardiovascular: Normal rate, regular rhythm and normal heart sounds.   Pulmonary/Chest: Effort normal and breath sounds normal. Right breast exhibits no inverted nipple, no mass, no nipple discharge, no skin change and no tenderness. Left breast exhibits no inverted nipple, no nipple discharge, no skin change and no tenderness.    Left breast volume lost in the lower inner quadrant and redness.  Well healed scar at 7 o'clock left breast. 2 o'clock 8 cm from nipple left breast mass.   Neurological: She is alert and oriented to person, place, and time.  Skin: Skin is warm and dry.    Data Reviewed Laboratory studies dated 01/06/2015 showed a hemoglobin of 13.4 with an MCV of 92. White blood cell count of 4500 with normal differential., Marene Lenz metabolic  panel was normal. Estimated GFR 69. Creatinine 0.88. Mild elevation of serum cholesterol at 213. HDL, 80.   ECG showed sinus rhythm with a nonspecific T wave abnormality.  PCP notes of 11/06/2014 reviewed. Multiple 1.5 cm nodules noted in the lateral aspect of the treated breast.  Ultrasound examination of the left breast was completed in the area of palpable thickening. In the 2:00 position, 8 cm from the nipple a irregular area with multiple hypoechoic lesions suggestive complex cyst versus fat necrosis was identified. This measured up to 0.8 cm in diameter. Associated local hyperechoic tissue is noted. At the 1:00 position 10 cm from the nipple an area of palpable thickening below the axillary incision is 0.48 x 0.64 x 0.72 cm slightly irregular hypoechoic nodule with neither acoustic enhancement or shadowing is appreciated. BI-RADS-4.  The patient was amenable to biopsy of the dominant lesion at the 2:00 position. 10 mL of 0.5% Xylocaine with 0.25% Marcaine with 1-200,000 of epinephrine was utilized well tolerated. Making use of a Finesse biopsy device, 14-gauge, multiple core samples were taken through 2 locations in this area. Scant bleeding was noted. The skin defect was closed with benzoin and Steri-Strips followed by Telfa and Tegaderm dressing. Written instructions for post biopsy care were provided.    Assessment    Possible fat necrosis post surgery/radiation versus secondary tumor.    Plan    The  patient will be contacted when pathology is available and further follow-up determined at that time.     PCP:  Etheleen Mayhew 01/14/2015, 7:32 AM

## 2015-01-13 NOTE — Patient Instructions (Signed)

## 2015-01-14 ENCOUNTER — Telehealth: Payer: Self-pay | Admitting: *Deleted

## 2015-01-14 ENCOUNTER — Encounter: Payer: Self-pay | Admitting: General Surgery

## 2015-01-14 DIAGNOSIS — N63 Unspecified lump in unspecified breast: Secondary | ICD-10-CM | POA: Insufficient documentation

## 2015-01-14 DIAGNOSIS — N6320 Unspecified lump in the left breast, unspecified quadrant: Secondary | ICD-10-CM | POA: Insufficient documentation

## 2015-01-14 NOTE — Telephone Encounter (Signed)
Dr Saralyn Pilar called with biopsy results from yesterday, lymph node with dermatopathic changes, no cancer.

## 2015-01-14 NOTE — Telephone Encounter (Signed)
Notified patient as instructed, patient pleased. Discussed follow-up appointments, patient agrees  

## 2015-01-14 NOTE — Telephone Encounter (Signed)
Please notify the patient of the results. She should plan on keeping her June 22nd scheduled appt w/ me. Thanks.

## 2015-01-20 ENCOUNTER — Ambulatory Visit (INDEPENDENT_AMBULATORY_CARE_PROVIDER_SITE_OTHER): Payer: Medicare Other

## 2015-01-20 DIAGNOSIS — N6320 Unspecified lump in the left breast, unspecified quadrant: Secondary | ICD-10-CM

## 2015-01-20 DIAGNOSIS — N63 Unspecified lump in breast: Secondary | ICD-10-CM

## 2015-01-20 NOTE — Progress Notes (Signed)
Patient ID: Michelle Pitts, female   DOB: 1951-01-25, 64 y.o.   MRN: [id number] Patient came in today for a wound check.  The wound is clean, with no signs of infection noted. Steri strip in place. Patient aware of pathology. Follow up as scheduled.

## 2015-01-26 ENCOUNTER — Ambulatory Visit
Admission: RE | Admit: 2015-01-26 | Discharge: 2015-01-26 | Disposition: A | Discharge status: Home / Self Care | Payer: Medicare Other | Source: Ambulatory Visit | Attending: Hematology and Oncology | Admitting: Hematology and Oncology

## 2015-01-26 ENCOUNTER — Other Ambulatory Visit: Payer: Self-pay | Admitting: Hematology and Oncology

## 2015-01-26 ENCOUNTER — Ambulatory Visit: Payer: Medicare Other

## 2015-01-26 DIAGNOSIS — Z853 Personal history of malignant neoplasm of breast: Secondary | ICD-10-CM

## 2015-01-26 DIAGNOSIS — C50919 Malignant neoplasm of unspecified site of unspecified female breast: Secondary | ICD-10-CM

## 2015-02-03 ENCOUNTER — Encounter: Payer: Self-pay | Admitting: Family Medicine

## 2015-02-04 ENCOUNTER — Ambulatory Visit (INDEPENDENT_AMBULATORY_CARE_PROVIDER_SITE_OTHER): Payer: Medicare Other | Admitting: General Surgery

## 2015-02-04 ENCOUNTER — Encounter: Payer: Self-pay | Admitting: General Surgery

## 2015-02-04 VITALS — BP 120/78 | HR 80 | Resp 14 | Ht 66.0 in | Wt 227.0 lb

## 2015-02-04 DIAGNOSIS — C50912 Malignant neoplasm of unspecified site of left female breast: Secondary | ICD-10-CM

## 2015-02-04 NOTE — Progress Notes (Signed)
Patient ID: Michelle Pitts, female   DOB: Nov 22, 1950, 64 y.o.   MRN: [id number]  No chief complaint on file.   HPI Michelle Pitts is a 64 y.o. female with a history of left breast cancer who presents for a breast evaluation. The most recent mammogram was done on 01/26/15. Patient does perform regular self breast checks and gets regular mammograms done. She had a left breast biopsy done here on 01/13/15 that was negative. She reports ongoing volume loss in the breast. She reports that and she did do over it again, she would've proceeded directly to mastectomy.    HPI  Past Medical History  Diagnosis Date  . Anxiety   . COPD (chronic obstructive pulmonary disease)     bronchitis  . 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 Cancer 1995    vulvar, [facility]  . Breast cancer 01/23/2014    Left breast, T1c, N0, M0. ER/PR +; Her 2 neu not overexpressed. Oncotype DX: 13,Low risk.9% over 10 years.     Past Surgical History  Procedure Laterality Date  . Abdominal hysterectomy  1978    partial  . Cholecystectomy    . Bladder suspension  2001  . Rotator cuff repair Right 1995  . Wide excision debriedment Left 04/10/14  . Breast surgery Left 01/23/14    wide excision   . Breast surgery  04/10/14    Debridement of fat necrosis  . Breast lumpectomy Left 01/23/2014    positive  . Breast biopsy Right 1975, 1985    benign cyst [name]) and fibroadenoma([facility])  . Breast biopsy Right 01/03/2014    Papilloma, no atypia detected on mammography.  . Breast biopsy Left 01/03/2014    Invasive mammary carcinoma.  . Breast biopsy Left 01/13/2015    ultrasound giuded biopsy, negative  . Breast cyst aspiration Left 1985    Family History  Problem Relation Age of Onset  . Diabetes Father   . COPD Mother   . Cancer Other     great great paternal aunt, ? age  . Cancer Maternal Grandfather 11    ? source  . Cancer Paternal Grandfather 87    ? source    Social History History  Substance Use  Topics  . Smoking status: Former Smoker -- 1.00 packs/day for 15 years    Types: Cigarettes  . Smokeless tobacco: Never Used  . Alcohol Use: 0.0 oz/week    0 Standard drinks or equivalent per week     Comment: 2/day    Allergies  Allergen Reactions  . Codeine Nausea And Vomiting  . Levaquin [Levofloxacin In D5w] Hives    Current Outpatient Prescriptions  Medication Sig Dispense Refill  . albuterol (PROVENTIL HFA;VENTOLIN HFA) 108 (90 BASE) MCG/ACT inhaler Inhale 1 puff into the lungs every 6 (six) hours as needed for wheezing or shortness of breath.    . ALPRAZolam (XANAX XR) 0.5 MG 24 hr tablet Take 0.5 mg by mouth as needed for anxiety.    . CHANTIX STARTING MONTH PAK 0.5 MG X 11 & 1 MG X 42 tablet Take 50 mg by mouth 2 (two) times daily.     Marland Kitchen exemestane (AROMASIN) 25 MG tablet   0  . metoprolol succinate (TOPROL-XL) 25 MG 24 hr tablet      No current facility-administered medications for this visit.    Review of Systems Review of Systems  Constitutional: Negative.   Respiratory: Negative.   Cardiovascular: Negative.     Blood pressure 120/78,  pulse 80, resp. rate 14, height '5\' 6"'$  (1.676 m), weight 227 lb (102.967 kg).  Physical Exam Physical Exam  Constitutional: She is oriented to person, place, and time. She appears well-developed and well-nourished.  Eyes: Conjunctivae are normal. No scleral icterus.  Neck: Neck supple.  Cardiovascular: Normal rate, regular rhythm and normal heart sounds.   Pulmonary/Chest: Effort normal and breath sounds normal. Right breast exhibits no inverted nipple, no mass, no nipple discharge, no skin change and no tenderness. Left breast exhibits skin change (mild skin thickening). Left breast exhibits no inverted nipple, no mass, no nipple discharge and no tenderness.    20% plus or minus ball in discrepancy left smaller than right.  Lymphadenopathy:    She has no cervical adenopathy.  Neurological: She is alert and oriented to person,  place, and time.  Skin: Skin is warm and dry.    Data Reviewed Bilateral mammograms dated 01/26/2015 were reviewed. Significant distortion left breast status post excision, reexcision and fat necrosis. BI-RADS-3. Right breast was unremarkable. These films were independently reviewed.  Assessment    Stable left breast exam. Moderate asymmetry secondary to surgery and radiation therapy.    Plan    I don't believe the 6 month follow-up mammogram is indicated, but I will be glad ordered if this will provide reassurance to the patient.  At present we'll plan for a follow-up examination in 6 months with or without mammogram based on the patient's desire.    PCP: Etheleen Mayhew 02/05/2015, 6:36 PM

## 2015-02-04 NOTE — Patient Instructions (Signed)
Continue self breast exams. Call office for any new breast issues or concerns. Follow up in 6 months.

## 2015-02-11 ENCOUNTER — Encounter: Payer: Self-pay | Admitting: Radiation Oncology

## 2015-02-11 ENCOUNTER — Ambulatory Visit
Admission: RE | Admit: 2015-02-11 | Discharge: 2015-02-11 | Disposition: A | Discharge status: Home / Self Care | Payer: Medicare Other | Source: Ambulatory Visit | Attending: Radiation Oncology | Admitting: Radiation Oncology

## 2015-02-11 VITALS — BP 151/88 | HR 87 | Temp 97.8°F | Resp 18 | Wt 224.2 lb

## 2015-02-11 DIAGNOSIS — C50912 Malignant neoplasm of unspecified site of left female breast: Secondary | ICD-10-CM

## 2015-02-11 NOTE — Progress Notes (Signed)
Radiation Oncology Follow up Note  Name: Michelle Pitts   Date:   02/11/2015 MRN:  [id number] DOB: 03/16/1951    This 63 y.o. female presents to the clinic today for follow-up for stage I breast cancer.  REFERRING PROVIDER: Maloney, Nancy, MD  HPI: Patient is a 63-year-old female now out 6 months having completed radiation therapy to her left breast for lower inner quadrant T1 invasive mammary carcinoma ER/PR positive HER-2/neu not overexpressed. She is seen today in routine follow-up and is doing well. She's developed some. Thickness in the left lateral portion of her breast which has been biopsied and was benign is also not worrisome for malignancy on mammogram. She otherwise specifically denies breast tenderness cough or bone pain. Currently on aromatase inhibitor and tolerating that well.  COMPLICATIONS OF TREATMENT: none  FOLLOW UP COMPLIANCE: keeps appointments   PHYSICAL EXAM:  BP 151/88 mmHg  Pulse 87  Temp(Src) 97.8 F (36.6 C)  Resp 18  Wt 224 lb 3.3 oz (101.7 kg) Lungs are clear to A&P cardiac examination essentially unremarkable with regular rate and rhythm. No dominant mass or nodularity is noted in either breast in 2 positions examined. Incision is well-healed. No axillary or supraclavicular adenopathy is appreciated. Cosmetic result is excellent. Some shrinkage of the breast is noted. Also dominant mass in left lateral portion of the breast described above as probable radiation changes i.e. fat necrosis has been biopsied and is negative. Well-developed well-nourished patient in NAD. HEENT reveals PERLA, EOMI, discs not visualized.  Oral cavity is clear. No oral mucosal lesions are identified. Neck is clear without evidence of cervical or supraclavicular adenopathy. Lungs are clear to A&P. Cardiac examination is essentially unremarkable with regular rate and rhythm without murmur rub or thrill. Abdomen is benign with no organomegaly or masses noted. Motor sensory and DTR  levels are equal and symmetric in the upper and lower extremities. Cranial nerves II through XII are grossly intact. Proprioception is intact. No peripheral adenopathy or edema is identified. No motor or sensory levels are noted. Crude visual fields are within normal range.   RADIOLOGY RESULTS: Mammograms are reviewed  PLAN: At the present time she is now 6 months out and doing well with no evidence of disease. Continue follow-up on the mass in the left lateral portion of her breast is recommended. She continues on aromatase inhibitor therapy without side effect. I am please were overall progress. I've asked to see her back in 1 year for follow-up. Patient is to call sooner with any concerns.  I would like to take this opportunity for allowing me to participate in the care of your patient..    CHRYSTAL,GLENN S., MD   

## 2015-03-09 ENCOUNTER — Other Ambulatory Visit: Payer: Self-pay | Admitting: Family Medicine

## 2015-03-09 DIAGNOSIS — F17201 Nicotine dependence, unspecified, in remission: Secondary | ICD-10-CM | POA: Insufficient documentation

## 2015-03-09 DIAGNOSIS — Z720 Tobacco use: Secondary | ICD-10-CM

## 2015-03-09 DIAGNOSIS — F17200 Nicotine dependence, unspecified, uncomplicated: Secondary | ICD-10-CM | POA: Insufficient documentation

## 2015-03-09 NOTE — Telephone Encounter (Signed)
Last OV 12/2014 

## 2015-04-21 ENCOUNTER — Other Ambulatory Visit: Payer: Self-pay | Admitting: *Deleted

## 2015-04-21 ENCOUNTER — Telehealth: Payer: Self-pay | Admitting: *Deleted

## 2015-04-21 DIAGNOSIS — C50912 Malignant neoplasm of unspecified site of left female breast: Secondary | ICD-10-CM

## 2015-04-21 NOTE — Telephone Encounter (Signed)
Per Dr Oliva Bustard, patient to stop exemestane until next visit. Patient informed and repeated this back to me

## 2015-04-21 NOTE — Telephone Encounter (Addendum)
Called to report that both feet are hurting deep inside and asking if her med could be causing it, she did not get chemo, but is in exemestane s/p XRT. She said she can soak them in warm water  And they feel better for a while, but then start hurting again. I asked her to call her MD and told her I would let MD here know about it as well

## 2015-04-23 ENCOUNTER — Other Ambulatory Visit: Payer: Medicare Other

## 2015-04-23 ENCOUNTER — Ambulatory Visit: Payer: Medicare Other | Admitting: Oncology

## 2015-04-29 ENCOUNTER — Encounter: Payer: Self-pay | Admitting: Oncology

## 2015-04-29 ENCOUNTER — Inpatient Hospital Stay (HOSPITAL_BASED_OUTPATIENT_CLINIC_OR_DEPARTMENT_OTHER): Payer: Medicare Other | Admitting: Oncology

## 2015-04-29 ENCOUNTER — Inpatient Hospital Stay: Payer: Medicare Other | Attending: Oncology

## 2015-04-29 VITALS — BP 168/108 | HR 98 | Temp 99.2°F | Resp 18 | Wt 225.5 lb

## 2015-04-29 DIAGNOSIS — Z809 Family history of malignant neoplasm, unspecified: Secondary | ICD-10-CM | POA: Insufficient documentation

## 2015-04-29 DIAGNOSIS — Z923 Personal history of irradiation: Secondary | ICD-10-CM

## 2015-04-29 DIAGNOSIS — F419 Anxiety disorder, unspecified: Secondary | ICD-10-CM | POA: Diagnosis not present

## 2015-04-29 DIAGNOSIS — J449 Chronic obstructive pulmonary disease, unspecified: Secondary | ICD-10-CM | POA: Insufficient documentation

## 2015-04-29 DIAGNOSIS — Z79899 Other long term (current) drug therapy: Secondary | ICD-10-CM | POA: Insufficient documentation

## 2015-04-29 DIAGNOSIS — Z8589 Personal history of malignant neoplasm of other organs and systems: Secondary | ICD-10-CM | POA: Diagnosis not present

## 2015-04-29 DIAGNOSIS — Z87891 Personal history of nicotine dependence: Secondary | ICD-10-CM

## 2015-04-29 DIAGNOSIS — C50512 Malignant neoplasm of lower-outer quadrant of left female breast: Secondary | ICD-10-CM

## 2015-04-29 DIAGNOSIS — Z170 Estrogen receptor positive status [ER+]: Secondary | ICD-10-CM | POA: Diagnosis not present

## 2015-04-29 DIAGNOSIS — C50912 Malignant neoplasm of unspecified site of left female breast: Secondary | ICD-10-CM

## 2015-04-29 DIAGNOSIS — Z79811 Long term (current) use of aromatase inhibitors: Secondary | ICD-10-CM

## 2015-04-29 LAB — CBC WITH DIFFERENTIAL/PLATELET
Basophils Absolute: 0 10*3/uL (ref 0–0.1)
Basophils Relative: 1 %
Eosinophils Absolute: 0.1 10*3/uL (ref 0–0.7)
Eosinophils Relative: 3 %
HCT: 40.4 % (ref 35.0–47.0)
HEMOGLOBIN: 13.5 g/dL (ref 12.0–16.0)
LYMPHS PCT: 25 %
Lymphs Abs: 1.2 10*3/uL (ref 1.0–3.6)
MCH: 30.9 pg (ref 26.0–34.0)
MCHC: 33.4 g/dL (ref 32.0–36.0)
MCV: 92.5 fL (ref 80.0–100.0)
Monocytes Absolute: 0.4 10*3/uL (ref 0.2–0.9)
Monocytes Relative: 8 %
NEUTROS PCT: 63 %
Neutro Abs: 3 10*3/uL (ref 1.4–6.5)
Platelets: 231 10*3/uL (ref 150–440)
RBC: 4.37 MIL/uL (ref 3.80–5.20)
RDW: 13.4 % (ref 11.5–14.5)
WBC: 4.7 10*3/uL (ref 3.6–11.0)

## 2015-04-29 LAB — COMPREHENSIVE METABOLIC PANEL
ALT: 35 U/L (ref 14–54)
AST: 32 U/L (ref 15–41)
Albumin: 4.5 g/dL (ref 3.5–5.0)
Alkaline Phosphatase: 118 U/L (ref 38–126)
Anion gap: 8 (ref 5–15)
BUN: 14 mg/dL (ref 6–20)
CO2: 30 mmol/L (ref 22–32)
CREATININE: 0.84 mg/dL (ref 0.44–1.00)
Calcium: 10.2 mg/dL (ref 8.9–10.3)
Chloride: 104 mmol/L (ref 101–111)
GFR calc Af Amer: >60 mL/min (ref >60)
Glucose, Bld: 105 mg/dL — ABNORMAL HIGH (ref 65–99)
Potassium: 5.6 mmol/L — ABNORMAL HIGH (ref 3.5–5.1)
Sodium: 142 mmol/L (ref 135–145)
Total Bilirubin: 0.6 mg/dL (ref 0.3–1.2)
Total Protein: 7.7 g/dL (ref 6.5–8.1)

## 2015-04-29 MED ORDER — EXEMESTANE 25 MG PO TABS: 25.0000 mg | ORAL_TABLET | Freq: Every day | ORAL | Status: DC

## 2015-04-29 NOTE — Progress Notes (Signed)
[place] @ [facility] [phone]  Fax:([phone] [place]: Jan 08, 1951  MR#: [id number]  KGM#:[id number]  Patient Care Team: Margarita Rana, MD as PCP - General (Family Medicine) Margarita Rana, MD as Referring Physician (Family Medicine) Robert Bellow, MD (General Surgery)  CHIEF COMPLAINT:  Chief Complaint  Patient presents with  . Rash    right breast   1.  Carcinoma of left breast stage IC N0 M0 tumor status post lumpectomy margins were close but clear.  Estrogen receptor positive progesterone receptor positive.  Status post radiation therapy finished in  November of 2015. Oncotype DX score is low. Irregular hypoechoic mass in the 8 o'clock location of the left  breast (diagnosis in July of 2015) 2.  Aromasin started on July 14, 2014. 3.  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  Patient had a biopsy in May of 2016 and abnormal area of the  Mammogram by Dr. Bary Castilla and has been reported to be negative for any malignancy   INTERVAL HISTORY:  64 year old lady came today further follow-up regarding carcinoma breast.  Had a recent mammogram reported to be negative.  Patient had a palpable area in the left breast which was evaluated by Dr. Tollie Pizza with ultrasound there was a benign lymph node and architectural distortions of biopsy was done which has been reported to be negative I do not have pathology report for my review.  Patient is taking Aromasin and tolerating very well taking calcium and vitamin D Needs bone density study.  Patient is taking Aromasin REVIEW OF SYSTEMS:   GENERAL:  Feels good.  Active.  No fevers, sweats or weight loss. PERFORMANCE STATUS (ECOG):  0 HEENT:  No visual changes, runny nose, sore throat, mouth sores or tenderness. Lungs: No shortness of breath or cough.  No hemoptysis. Cardiac:  No chest pain, palpitations, orthopnea, or PND. GI:  No nausea, vomiting, diarrhea, constipation,  melena or hematochezia. GU:  No urgency, frequency, dysuria, or hematuria. Musculoskeletal:  No back pain.  No joint pain.  No muscle tenderness. Extremities:  No pain or swelling. Skin:  No rashes or skin changes. Neuro:  No headache, numbness or weakness, balance or coordination issues. Endocrine:  No diabetes, thyroid issues, hot flashes or night sweats. Psych:  No mood changes, depression or anxiety. Pain:  No focal pain. Review of systems:  All other systems reviewed and found to be negative. As per HPI. Otherwise, a complete review of systems is negatve.  PAST MEDICAL HISTORY: Past Medical History  Diagnosis Date  . Anxiety   . COPD (chronic obstructive pulmonary disease)     bronchitis  . 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 Cancer 1995    vulvar, [facility]  . Breast cancer 01/23/2014    Left breast, T1c, N0, M0. ER/PR +; Her 2 neu not overexpressed. Oncotype DX: 13,Low risk.9% over 10 years.     PAST SURGICAL HISTORY: Past Surgical History  Procedure Laterality Date  . Abdominal hysterectomy  1978    partial  . Cholecystectomy    . Bladder suspension  2001  . Rotator cuff repair Right 1995  . Wide excision debriedment Left 04/10/14  . Breast surgery Left 01/23/14    wide excision   . Breast surgery  04/10/14    Debridement of fat necrosis  . Breast lumpectomy Left 01/23/2014    positive  . Breast biopsy Right 1975, 1985    benign cyst [name]) and  fibroadenoma([facility])  . Breast biopsy Right 01/03/2014    Papilloma, no atypia detected on mammography.  . Breast biopsy Left 01/03/2014    Invasive mammary carcinoma.  . Breast biopsy Left 01/13/2015    ultrasound giuded biopsy, negative  . Breast cyst aspiration Left 1985    FAMILY HISTORY Family History  Problem Relation Age of Onset  . Diabetes Father   . COPD Mother   . Cancer Other     great great paternal aunt, ? age  . Cancer Maternal Grandfather 57    ? source  . Cancer Paternal Grandfather 19    ?  source    ADVANCED DIRECTIVES:  No flowsheet data found.  HEALTH MAINTENANCE: Social History  Substance Use Topics  . Smoking status: Former Smoker -- 1.00 packs/day for 15 years    Types: Cigarettes    Quit date: 09/15/2014  . Smokeless tobacco: Never Used  . Alcohol Use: 0.0 oz/week    0 Standard drinks or equivalent per week     Comment: 2/day      Allergies  Allergen Reactions  . Codeine Nausea And Vomiting  . Levaquin [Levofloxacin In D5w] Hives    Current Outpatient Prescriptions  Medication Sig Dispense Refill  . albuterol (PROVENTIL HFA;VENTOLIN HFA) 108 (90 BASE) MCG/ACT inhaler Inhale 1 puff into the lungs every 6 (six) hours as needed for wheezing or shortness of breath.    . ALPRAZolam (XANAX XR) 0.5 MG 24 hr tablet Take 0.5 mg by mouth as needed for anxiety.    Hendricks Limes CONTINUING MONTH PAK 1 MG tablet take 1 tablet by mouth twice a day 60 tablet 5  . exemestane (AROMASIN) 25 MG tablet   0  . fluticasone (FLONASE) 50 MCG/ACT nasal spray   0  . metoprolol succinate (TOPROL-XL) 25 MG 24 hr tablet      No current facility-administered medications for this visit.    OBJECTIVE:  Filed Vitals:   04/29/15 1225  BP: 168/108  Pulse: 98  Temp: 99.2 F (37.3 C)  Resp: 18     Body mass index is 36.42 kg/(m^2).    ECOG FS:0 - Asymptomatic  PHYSICAL EXAM: GENERAL:  Well developed, well nourished, sitting comfortably in the exam room in no acute distress. MENTAL STATUS:  Alert and oriented to person, place and time.  RESPIRATORY:  Clear to auscultation without rales, wheezes or rhonchi. CARDIOVASCULAR:  Regular rate and rhythm without murmur, rub or gallop. BREAST:  Right breast without masses, skin changes or nipple discharge.  Left breast without masses, skin changes or nipple discharge.  Left breast lower and inner quadrant has radiation changes ABDOMEN:  Soft, non-tender, with active bowel sounds, and no hepatosplenomegaly.  No masses. BACK:  No CVA  tenderness.  No tenderness on percussion of the back or rib cage. SKIN:  No rashes, ulcers or lesions. EXTREMITIES: No edema, no skin discoloration or tenderness.  No palpable cords. LYMPH NODES: No palpable cervical, supraclavicular, axillary or inguinal adenopathy  NEUROLOGICAL: Unremarkable. PSYCH:  Appropriate.   LAB RESULTS:  CBC Latest Ref Rng 04/29/2015 07/14/2014  WBC 3.6 - 11.0 K/uL 4.7 5.6  Hemoglobin 12.0 - 16.0 g/dL 13.5 13.2  Hematocrit 35.0 - 47.0 % 40.4 39.6  Platelets 150 - 440 K/uL 231 256    Appointment on 04/29/2015  Component Date Value Ref Range Status  . WBC 04/29/2015 4.7  3.6 - 11.0 K/uL Final  . RBC 04/29/2015 4.37  3.80 - 5.20 MIL/uL Final  . Hemoglobin 04/29/2015  13.5  12.0 - 16.0 g/dL Final  . HCT 04/29/2015 40.4  35.0 - 47.0 % Final  . MCV 04/29/2015 92.5  80.0 - 100.0 fL Final  . MCH 04/29/2015 30.9  26.0 - 34.0 pg Final  . MCHC 04/29/2015 33.4  32.0 - 36.0 g/dL Final  . RDW 04/29/2015 13.4  11.5 - 14.5 % Final  . Platelets 04/29/2015 231  150 - 440 K/uL Final  . Neutrophils Relative % 04/29/2015 63   Final  . Neutro Abs 04/29/2015 3.0  1.4 - 6.5 K/uL Final  . Lymphocytes Relative 04/29/2015 25   Final  . Lymphs Abs 04/29/2015 1.2  1.0 - 3.6 K/uL Final  . Monocytes Relative 04/29/2015 8   Final  . Monocytes Absolute 04/29/2015 0.4  0.2 - 0.9 K/uL Final  . Eosinophils Relative 04/29/2015 3   Final  . Eosinophils Absolute 04/29/2015 0.1  0 - 0.7 K/uL Final  . Basophils Relative 04/29/2015 1   Final  . Basophils Absolute 04/29/2015 0.0  0 - 0.1 K/uL Final  . Sodium 04/29/2015 142  135 - 145 mmol/L Final  . Potassium 04/29/2015 5.6* 3.5 - 5.1 mmol/L Final  . Chloride 04/29/2015 104  101 - 111 mmol/L Final  . CO2 04/29/2015 30  22 - 32 mmol/L Final  . Glucose, Bld 04/29/2015 105* 65 - 99 mg/dL Final  . BUN 04/29/2015 14  6 - 20 mg/dL Final  . Creatinine, Ser 04/29/2015 0.84  0.44 - 1.00 mg/dL Final  . Calcium 04/29/2015 10.2  8.9 - 10.3 mg/dL Final   . Total Protein 04/29/2015 7.7  6.5 - 8.1 g/dL Final  . Albumin 04/29/2015 4.5  3.5 - 5.0 g/dL Final  . AST 04/29/2015 32  15 - 41 U/L Final  . ALT 04/29/2015 35  14 - 54 U/L Final  . Alkaline Phosphatase 04/29/2015 118  38 - 126 U/L Final  . Total Bilirubin 04/29/2015 0.6  0.3 - 1.2 mg/dL Final  . GFR calc non Af Amer 04/29/2015 >60  >60 mL/min Final  . GFR calc Af Amer 04/29/2015 >60  >60 mL/min Final   Comment: (NOTE) The eGFR has been calculated using the CKD EPI equation. This calculation has not been validated in all clinical situations. eGFR's persistently <60 mL/min signify possible Chronic Kidney Disease.   . Anion gap 04/29/2015 8  5 - 15 Final      STUDIES: In June of 2016 Patient had a mammogram Reported to be negative.  But 6 month follow-up has been recommended. ASSESSMENT: Carcinoma of breast on Aromasin and arrange for bone density.   I believe it up to Dr. Tollie Pizza to arrange for next mammogram depending on his clinical judgment.   MEDICAL DECISION MAKING:  Continue Aromasin calcium and vitamin D  Patient expressed understanding and was in agreement with this plan. She also understands that She can call clinic at any time with any questions, concerns, or complaints.    No matching staging information was found for the patient.  Forest Gleason, MD   04/29/2015 12:44 PM

## 2015-04-30 ENCOUNTER — Other Ambulatory Visit: Payer: Self-pay | Admitting: *Deleted

## 2015-04-30 DIAGNOSIS — C50912 Malignant neoplasm of unspecified site of left female breast: Secondary | ICD-10-CM

## 2015-05-13 ENCOUNTER — Ambulatory Visit
Admission: RE | Admit: 2015-05-13 | Discharge: 2015-05-13 | Disposition: A | Discharge status: Home / Self Care | Payer: Medicare Other | Source: Ambulatory Visit | Attending: Oncology | Admitting: Oncology

## 2015-05-13 DIAGNOSIS — Z79899 Other long term (current) drug therapy: Secondary | ICD-10-CM | POA: Diagnosis present

## 2015-05-13 DIAGNOSIS — C50912 Malignant neoplasm of unspecified site of left female breast: Secondary | ICD-10-CM

## 2015-05-15 ENCOUNTER — Encounter: Payer: Self-pay | Admitting: Family Medicine

## 2015-05-15 ENCOUNTER — Ambulatory Visit (INDEPENDENT_AMBULATORY_CARE_PROVIDER_SITE_OTHER): Payer: Medicare Other | Admitting: Family Medicine

## 2015-05-15 ENCOUNTER — Other Ambulatory Visit: Payer: Self-pay

## 2015-05-15 VITALS — BP 146/84 | HR 118 | Temp 98.6°F | Resp 20 | Wt 230.4 lb

## 2015-05-15 DIAGNOSIS — K219 Gastro-esophageal reflux disease without esophagitis: Secondary | ICD-10-CM | POA: Insufficient documentation

## 2015-05-15 DIAGNOSIS — R899 Unspecified abnormal finding in specimens from other organs, systems and tissues: Secondary | ICD-10-CM | POA: Insufficient documentation

## 2015-05-15 DIAGNOSIS — R000 Tachycardia, unspecified: Secondary | ICD-10-CM | POA: Diagnosis not present

## 2015-05-15 DIAGNOSIS — E7800 Pure hypercholesterolemia, unspecified: Secondary | ICD-10-CM | POA: Insufficient documentation

## 2015-05-15 DIAGNOSIS — E039 Hypothyroidism, unspecified: Secondary | ICD-10-CM | POA: Insufficient documentation

## 2015-05-15 DIAGNOSIS — K635 Polyp of colon: Secondary | ICD-10-CM | POA: Insufficient documentation

## 2015-05-15 DIAGNOSIS — G4700 Insomnia, unspecified: Secondary | ICD-10-CM | POA: Insufficient documentation

## 2015-05-15 DIAGNOSIS — F419 Anxiety disorder, unspecified: Secondary | ICD-10-CM | POA: Insufficient documentation

## 2015-05-15 DIAGNOSIS — C519 Malignant neoplasm of vulva, unspecified: Secondary | ICD-10-CM | POA: Insufficient documentation

## 2015-05-15 DIAGNOSIS — E038 Other specified hypothyroidism: Secondary | ICD-10-CM | POA: Insufficient documentation

## 2015-05-15 DIAGNOSIS — J449 Chronic obstructive pulmonary disease, unspecified: Secondary | ICD-10-CM | POA: Insufficient documentation

## 2015-05-15 DIAGNOSIS — Z833 Family history of diabetes mellitus: Secondary | ICD-10-CM | POA: Insufficient documentation

## 2015-05-15 DIAGNOSIS — R7309 Other abnormal glucose: Secondary | ICD-10-CM | POA: Insufficient documentation

## 2015-05-15 DIAGNOSIS — E559 Vitamin D deficiency, unspecified: Secondary | ICD-10-CM | POA: Insufficient documentation

## 2015-05-15 DIAGNOSIS — R739 Hyperglycemia, unspecified: Secondary | ICD-10-CM | POA: Insufficient documentation

## 2015-05-15 DIAGNOSIS — I10 Essential (primary) hypertension: Secondary | ICD-10-CM | POA: Insufficient documentation

## 2015-05-15 DIAGNOSIS — J441 Chronic obstructive pulmonary disease with (acute) exacerbation: Secondary | ICD-10-CM

## 2015-05-15 DIAGNOSIS — R748 Abnormal levels of other serum enzymes: Secondary | ICD-10-CM | POA: Insufficient documentation

## 2015-05-15 DIAGNOSIS — G4730 Sleep apnea, unspecified: Secondary | ICD-10-CM | POA: Insufficient documentation

## 2015-05-15 MED ORDER — CEFDINIR 300 MG PO CAPS: 300.0000 mg | ORAL_CAPSULE | Freq: Two times a day (BID) | ORAL | Status: DC

## 2015-05-15 MED ORDER — PREDNISONE 10 MG PO TABS: ORAL_TABLET | ORAL | Status: DC

## 2015-05-15 NOTE — Progress Notes (Signed)
Patient ID: Michelle Pitts, female   DOB: July 29, 1951, 64 y.o.   MRN: [id number] Name: Michelle Pitts   MRN: [id number]    DOB: Michelle Pitts 27, 1952   Date:05/15/2015       Progress Note  Subjective  Chief Complaint  Chief Complaint  Patient presents with  . URI    HPI Comments: Inhaler did help this am. Coughing up productive sputum. Has had similar previously, but really burns her chest this time when she coughs. Sputum very thick. Also has sinus pressure and drainage.    URI  This is a new problem. The current episode started yesterday. The problem has been gradually worsening. There has been no fever. Associated symptoms include congestion, coughing, joint pain and wheezing. She has tried decongestant for the symptoms. The treatment provided mild relief.   Started with sinus headache. Took decongestant and her headache resolves and then these other symptoms started. Has a hard time taking a deep breath. Does feel SOB.     Past Medical History  Diagnosis Date  . Anxiety   . COPD (chronic obstructive pulmonary disease)     bronchitis  . 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 Cancer 1995    vulvar, [facility]  . Breast cancer 01/23/2014    Left breast, T1c, N0, M0. ER/PR +; Her 2 neu not overexpressed. Oncotype DX: 13,Low risk.9% over 10 years.     Social History  Substance Use Topics  . Smoking status: Former Smoker -- 1.00 packs/day for 15 years    Types: Cigarettes    Quit date: 09/15/2014  . Smokeless tobacco: Never Used  . Alcohol Use: 0.0 oz/week    0 Standard drinks or equivalent per week     Comment: 2/day     Current outpatient prescriptions:  .  albuterol (PROVENTIL HFA;VENTOLIN HFA) 108 (90 BASE) MCG/ACT inhaler, Inhale 1 puff into the lungs every 6 (six) hours as needed for wheezing or shortness of breath., Disp: , Rfl:  .  ALPRAZolam (XANAX XR) 0.5 MG 24 hr tablet, Take 0.5 mg by mouth as needed for anxiety., Disp: , Rfl:  .  CALCIUM CITRATE, Take by mouth., Disp: ,  Rfl:  .  CHANTIX CONTINUING MONTH PAK 1 MG tablet, take 1 tablet by mouth twice a day, Disp: 60 tablet, Rfl: 5 .  Cholecalciferol 1000 UNITS capsule, Take by mouth., Disp: , Rfl:  .  EPINEPHrine (EPIPEN 2-PAK) 0.3 mg/0.3 mL IJ SOAJ injection, EPIPEN 2-PAK, 0.3MG/0.3ML (Injection Solution Auto-injector)  1 (one) Soln Auto-inj Soln Aut Soln Auto-inj as directed for 0 days  Quantity: 1;  Refills: 3   Ordered :11-Jan-2013  Oletta Lamas ;  Started 11-Jan-2013 Active, Disp: , Rfl:  .  exemestane (AROMASIN) 25 MG tablet, Take 1 tablet (25 mg total) by mouth daily., Disp: 30 tablet, Rfl: 6 .  fluticasone (FLONASE) 50 MCG/ACT nasal spray, , Disp: , Rfl: 0 .  metoprolol succinate (TOPROL-XL) 25 MG 24 hr tablet, , Disp: , Rfl:   Allergies  Allergen Reactions  . Codeine Nausea And Vomiting  . Hydrochlorothiazide Swelling    Swelling of tongue.  [place] [Levofloxacin In D5w] Hives    Review of Systems  Constitutional: Negative.  Negative for fever.  HENT: Positive for congestion.   Eyes: Negative.   Respiratory: Positive for cough, sputum production, shortness of breath and wheezing. Negative for hemoptysis.   Cardiovascular: Negative.   Gastrointestinal: Negative.   Genitourinary: Negative.   Musculoskeletal: Positive for joint pain.  Skin: Negative.  Neurological: Negative.   Endo/Heme/Allergies: Negative.   Psychiatric/Behavioral: Negative.     Objective  Filed Vitals:   05/15/15 1344  BP: 146/84  Pulse: 118  Temp: 98.6 F (37 C)  TempSrc: Oral  Resp: 20  Weight: 230 lb 6.4 oz (104.509 kg)  SpO2: 97%     Physical Exam  Constitutional: She is oriented to person, place, and time and well-developed, well-nourished, and in no distress.  HENT:  Head: Normocephalic and atraumatic.  Right Ear: External ear normal.  Left Ear: External ear normal.  Nose: Nose normal.  Mouth/Throat: Oropharynx is clear and moist. No oropharyngeal exudate.  Eyes: Conjunctivae and EOM are normal.  Pupils are equal, round, and reactive to light.  Cardiovascular: Normal rate and regular rhythm.   Pulmonary/Chest: Effort normal. She has decreased breath sounds.  Improved breath sounds with some audible wheezing after neb treatment.   Neurological: She is alert and oriented to person, place, and time.  Psychiatric: Mood, memory, affect and judgment normal.     Recent Results (from the past 2160 hour(s))  CBC with Differential     Status: None   Collection Time: 04/29/15 11:40 AM  Result Value Ref Range   WBC 4.7 3.6 - 11.0 K/uL   RBC 4.37 3.80 - 5.20 MIL/uL   Hemoglobin 13.5 12.0 - 16.0 g/dL   HCT 40.4 35.0 - 47.0 %   MCV 92.5 80.0 - 100.0 fL   MCH 30.9 26.0 - 34.0 pg   MCHC 33.4 32.0 - 36.0 g/dL   RDW 13.4 11.5 - 14.5 %   Platelets 231 150 - 440 K/uL   Neutrophils Relative % 63 %   Neutro Abs 3.0 1.4 - 6.5 K/uL   Lymphocytes Relative 25 %   Lymphs Abs 1.2 1.0 - 3.6 K/uL   Monocytes Relative 8 %   Monocytes Absolute 0.4 0.2 - 0.9 K/uL   Eosinophils Relative 3 %   Eosinophils Absolute 0.1 0 - 0.7 K/uL   Basophils Relative 1 %   Basophils Absolute 0.0 0 - 0.1 K/uL  Comprehensive metabolic panel     Status: Abnormal   Collection Time: 04/29/15 11:40 AM  Result Value Ref Range   Sodium 142 135 - 145 mmol/L   Potassium 5.6 (H) 3.5 - 5.1 mmol/L   Chloride 104 101 - 111 mmol/L   CO2 30 22 - 32 mmol/L   Glucose, Bld 105 (H) 65 - 99 mg/dL   BUN 14 6 - 20 mg/dL   Creatinine, Ser 0.84 0.44 - 1.00 mg/dL   Calcium 10.2 8.9 - 10.3 mg/dL   Total Protein 7.7 6.5 - 8.1 g/dL   Albumin 4.5 3.5 - 5.0 g/dL   AST 32 15 - 41 U/L   ALT 35 14 - 54 U/L   Alkaline Phosphatase 118 38 - 126 U/L   Total Bilirubin 0.6 0.3 - 1.2 mg/dL   GFR calc non Af Amer >60 >60 mL/min   GFR calc Af Amer >60 >60 mL/min    Comment: (NOTE) The eGFR has been calculated using the CKD EPI equation. This calculation has not been validated in all clinical situations. eGFR's persistently <60 mL/min signify  possible Chronic Kidney Disease.    Anion gap 8 5 - 15     Assessment & Plan 1. Tachycardia EKG with normal sinus.  No symptoms. Monitor and follow up if does not improve. Presumed secondary to respiratory symptoms.  - EKG 12-Lead  2. COPD with exacerbation New problem. Had been doing  well since not smoking. Improved air movement after nebulizer treatment. Will treat as noted. Call if worsens or does not improve.  - predniSONE (DELTASONE) 10 MG tablet; 6 po for 2 days and then 5 po for 2 days and then 4 po for 2 days and 3 po for 2 days and then 2 po for 2 days and then 1 po for 2 days.  Dispense: 42 tablet; Refill: 0 - cefdinir (OMNICEF) 300 MG capsule; Take 1 capsule (300 mg total) by mouth 2 (two) times daily.  Dispense: 14 capsule; Refill: 0   Margarita Rana, MD

## 2015-05-29 ENCOUNTER — Encounter: Payer: Self-pay | Admitting: Family Medicine

## 2015-05-29 ENCOUNTER — Ambulatory Visit (INDEPENDENT_AMBULATORY_CARE_PROVIDER_SITE_OTHER): Payer: Medicare Other | Admitting: Family Medicine

## 2015-05-29 VITALS — BP 142/68 | HR 106 | Temp 98.6°F | Resp 20 | Wt 234.0 lb

## 2015-05-29 DIAGNOSIS — J019 Acute sinusitis, unspecified: Secondary | ICD-10-CM | POA: Diagnosis not present

## 2015-05-29 DIAGNOSIS — J441 Chronic obstructive pulmonary disease with (acute) exacerbation: Secondary | ICD-10-CM

## 2015-05-29 MED ORDER — PREDNISONE 10 MG PO TABS: ORAL_TABLET | ORAL | Status: DC

## 2015-05-29 MED ORDER — LEVOFLOXACIN 500 MG PO TABS: 500.0000 mg | ORAL_TABLET | Freq: Every day | ORAL | Status: DC

## 2015-05-29 MED ORDER — FLUTICASONE-SALMETEROL 250-50 MCG/DOSE IN AEPB: 1.0000 | INHALATION_SPRAY | Freq: Two times a day (BID) | RESPIRATORY_TRACT | Status: DC

## 2015-05-29 NOTE — Progress Notes (Signed)
Subjective:    Patient ID: Michelle Pitts, female    DOB: 08/09/1951, 64 y.o.   MRN: [id number]  Cough This is a recurrent problem. The current episode started 1 to 4 weeks ago. The problem has been unchanged. The cough is productive of sputum. Associated symptoms include heartburn, myalgias, nasal congestion, postnasal drip, rhinorrhea, shortness of breath and wheezing. Pertinent negatives include no chest pain, chills, ear congestion, ear pain, fever, headaches, hemoptysis, rash, sore throat or sweats. She has tried oral steroids (abx and Proventil. Finiished Monday and started get worse again Wednesday.  Feels  tired.  ) for the symptoms. The treatment provided significant (on Prednisone) relief. Her past medical history is significant for COPD.   Pt was seen on 05/15/2015 and was treated for COPD exacerbation. Treatment for this included Prednisone and Omnicef.   Review of Systems  Constitutional: Negative for fever and chills.  HENT: Positive for postnasal drip and rhinorrhea. Negative for ear pain and sore throat.   Respiratory: Positive for cough, shortness of breath and wheezing. Negative for hemoptysis.   Cardiovascular: Negative for chest pain.  Gastrointestinal: Positive for heartburn.  Musculoskeletal: Positive for myalgias.  Skin: Negative for rash.  Neurological: Negative for headaches.   BP 142/68 mmHg  Pulse 106  Temp(Src) 98.6 F (37 C) (Oral)  Resp 20  Wt 234 lb (106.142 kg)  SpO2 96%   Patient Active Problem List   Diagnosis Date Noted  . Anxiety 05/15/2015  . Colon polyp 05/15/2015  . CAFL (chronic airflow limitation) ([place]) 05/15/2015  . Elevated blood sugar 05/15/2015  . Abnormal liver enzymes 05/15/2015  . Family history of diabetes mellitus 05/15/2015  . Acid reflux 05/15/2015  . Hypercholesteremia 05/15/2015  . BP (high blood pressure) 05/15/2015  . Cannot sleep 05/15/2015  . Apnea, sleep 05/15/2015  . Subclinical hypothyroidism 05/15/2015  .  Avitaminosis D 05/15/2015  . 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 Tobacco abuse 03/09/2015  . Left breast mass 01/14/2015  . 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 Breast cancer ([place]) 01/09/2014   Past Medical History  Diagnosis Date  . Anxiety   . COPD (chronic obstructive pulmonary disease) (HCC)     bronchitis  . 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 Cancer ([place]) 1995    vulvar, [facility]  . Breast cancer ([place]) 01/23/2014    Left breast, T1c, N0, M0. ER/PR +; Her 2 neu not overexpressed. Oncotype DX: 13,Low risk.9% over 10 years.    Current Outpatient Prescriptions on File Prior to Visit  Medication Sig  . albuterol (PROVENTIL HFA;VENTOLIN HFA) 108 (90 BASE) MCG/ACT inhaler Inhale 1 puff into the lungs every 6 (six) hours as needed for wheezing or shortness of breath.  . ALPRAZolam (XANAX XR) 0.5 MG 24 hr tablet Take 0.5 mg by mouth as needed for anxiety.  Marland Kitchen CALCIUM CITRATE Take by mouth.  Hendricks Limes CONTINUING MONTH PAK 1 MG tablet take 1 tablet by mouth twice a day (Patient taking differently: 1 tablet daily)  . Cholecalciferol 1000 UNITS capsule Take by mouth.  . EPINEPHrine (EPIPEN 2-PAK) 0.3 mg/0.3 mL IJ SOAJ injection EPIPEN 2-PAK, 0.'3MG'$ /0.3ML (Injection Solution Auto-injector)  1 (one) Soln Auto-inj Soln Aut Soln Auto-inj as directed for 0 days  Quantity: 1;  Refills: 3   Ordered :11-Jan-2013  Oletta Lamas ;  Started 11-Jan-2013 Active  . exemestane (AROMASIN) 25 MG tablet Take 1 tablet (25 mg total) by mouth daily.  . fluticasone (FLONASE) 50 MCG/ACT nasal spray   .  metoprolol succinate (TOPROL-XL) 25 MG 24 hr tablet    No current facility-administered medications on file prior to visit.   Allergies  Allergen Reactions  . Codeine Nausea And Vomiting  . Hydrochlorothiazide Swelling    Swelling of tongue.  Anner Crete In D5w] Hives   Past Surgical History  Procedure Laterality Date  . Abdominal hysterectomy  1978    partial  . Cholecystectomy     . Bladder suspension  2001  . Rotator cuff repair Right 1995  . Wide excision debriedment Left 04/10/14  . Breast surgery Left 01/23/14    wide excision   . Breast surgery  04/10/14    Debridement of fat necrosis  . Breast lumpectomy Left 01/23/2014    positive  . Breast biopsy Right 1975, 1985    benign cyst [name]) and fibroadenoma([facility])  . Breast biopsy Right 01/03/2014    Papilloma, no atypia detected on mammography.  . Breast biopsy Left 01/03/2014    Invasive mammary carcinoma.  . Breast biopsy Left 01/13/2015    ultrasound giuded biopsy, negative  . Breast cyst aspiration Left 1985   Social History   Social History  . Marital Status: Divorced    Spouse Name: N/A  . Number of Children: N/A  . Years of Education: N/A   Occupational History  . Not on file.   Social History Main Topics  . Smoking status: Former Smoker -- 1.00 packs/day for 15 years    Types: Cigarettes    Quit date: 09/15/2014  . Smokeless tobacco: Never Used  . Alcohol Use: 0.0 oz/week    0 Standard drinks or equivalent per week     Comment: 2/day  . Drug Use: No  . Sexual Activity: Not on file   Other Topics Concern  . Not on file   Social History Narrative   Family History  Problem Relation Age of Onset  . Diabetes Father   . COPD Mother   . Cancer Other     great great paternal aunt, ? age  . Cancer Maternal Grandfather 45    ? source  . Cancer Paternal Grandfather 25    ? source  . Heart failure Sister   . Emphysema Sister   . Diabetes Sister   . Hypertension Sister   . Emphysema Brother   . Lung cancer Brother   . Crohn's disease Sister   . Hypertension Sister   . Hypertension Sister   . Emphysema Brother        Objective:   Physical Exam  Constitutional: She is oriented to person, place, and time. She appears well-developed and well-nourished.  HENT:  Head: Normocephalic and atraumatic.  Right Ear: External ear normal.  Left Ear: External ear normal.   Nose: Mucosal edema and rhinorrhea present.  Mouth/Throat: Oropharynx is clear and moist.  Eyes: Conjunctivae and EOM are normal. Pupils are equal, round, and reactive to light.  Neck: Normal range of motion. Neck supple.  Cardiovascular: Normal rate and regular rhythm.   Pulmonary/Chest: Effort normal and breath sounds normal.  Neurological: She is alert and oriented to person, place, and time.  Psychiatric: She has a normal mood and affect. Her behavior is normal. Judgment and thought content normal.   BP 142/68 mmHg  Pulse 106  Temp(Src) 98.6 F (37 C) (Oral)  Resp 20  Wt 234 lb (106.142 kg)  SpO2 96%      Assessment & Plan:  1. Chronic obstructive pulmonary disease with acute exacerbation (HCC) Recurrent.  Will start another around of prednisone.   Also add Advair back.  Call if worsens or does not improve.   - predniSONE (DELTASONE) 10 MG tablet; 6 po for 2 days and then 5 po for 2 days and then 4 po for 2 days and 3 po for 2 days and then 2 po for 2 days and then 1 po for 2 days.  Dispense: 42 tablet; Refill: 0 - Fluticasone-Salmeterol (ADVAIR DISKUS) 250-50 MCG/DOSE AEPB; Inhale 1 puff into the lungs 2 (two) times daily.  Dispense: 60 each; Refill: 5  2. Acute sinusitis, recurrence not specified, unspecified location Did not improve with Omnicef. Will retry Levaquin, unclear if had allergy previously. Did have hives. Has list of what she should do if that happens and will re-add to allergy list. Does want to retry as it has helped so much in the past. Patient instructed to call back if condition worsens or does not improve.    - levofloxacin (LEVAQUIN) 500 MG tablet; Take 1 tablet (500 mg total) by mouth daily.  Dispense: 7 tablet; Refill: 0  Margarita Rana, MD

## 2015-06-25 ENCOUNTER — Ambulatory Visit (INDEPENDENT_AMBULATORY_CARE_PROVIDER_SITE_OTHER): Payer: Medicare Other

## 2015-06-25 DIAGNOSIS — Z23 Encounter for immunization: Secondary | ICD-10-CM | POA: Diagnosis not present

## 2015-07-21 ENCOUNTER — Ambulatory Visit (INDEPENDENT_AMBULATORY_CARE_PROVIDER_SITE_OTHER): Payer: Medicare Other | Admitting: Family Medicine

## 2015-07-21 ENCOUNTER — Encounter: Payer: Self-pay | Admitting: Family Medicine

## 2015-07-21 ENCOUNTER — Encounter: Payer: Self-pay | Admitting: General Surgery

## 2015-07-21 ENCOUNTER — Ambulatory Visit (INDEPENDENT_AMBULATORY_CARE_PROVIDER_SITE_OTHER): Payer: Medicare Other | Admitting: General Surgery

## 2015-07-21 VITALS — BP 142/78 | HR 110 | Temp 98.5°F | Resp 18 | Wt 235.0 lb

## 2015-07-21 VITALS — BP 134/76 | HR 88 | Resp 14 | Ht 66.0 in | Wt 235.0 lb

## 2015-07-21 DIAGNOSIS — C50912 Malignant neoplasm of unspecified site of left female breast: Secondary | ICD-10-CM

## 2015-07-21 DIAGNOSIS — I10 Essential (primary) hypertension: Secondary | ICD-10-CM | POA: Diagnosis not present

## 2015-07-21 DIAGNOSIS — J441 Chronic obstructive pulmonary disease with (acute) exacerbation: Secondary | ICD-10-CM

## 2015-07-21 DIAGNOSIS — R000 Tachycardia, unspecified: Secondary | ICD-10-CM

## 2015-07-21 DIAGNOSIS — E038 Other specified hypothyroidism: Secondary | ICD-10-CM | POA: Diagnosis not present

## 2015-07-21 DIAGNOSIS — R0602 Shortness of breath: Secondary | ICD-10-CM

## 2015-07-21 DIAGNOSIS — F419 Anxiety disorder, unspecified: Secondary | ICD-10-CM

## 2015-07-21 DIAGNOSIS — E039 Hypothyroidism, unspecified: Secondary | ICD-10-CM

## 2015-07-21 DIAGNOSIS — N641 Fat necrosis of breast: Secondary | ICD-10-CM | POA: Diagnosis not present

## 2015-07-21 DIAGNOSIS — C50312 Malignant neoplasm of lower-inner quadrant of left female breast: Secondary | ICD-10-CM | POA: Diagnosis not present

## 2015-07-21 MED ORDER — FLUTICASONE-SALMETEROL 250-50 MCG/DOSE IN AEPB: 1.0000 | INHALATION_SPRAY | Freq: Two times a day (BID) | RESPIRATORY_TRACT | Status: DC

## 2015-07-21 NOTE — Progress Notes (Signed)
Patient ID: Michelle Pitts, female   DOB: 12/26/1950, 64 y.o.   MRN: [id number]  Chief Complaint  Patient presents with  . Follow-up    breast cancer    HPI Michelle Pitts is a 64 y.o. female. here today following up from left breast cancer. Patient states se had to see DR. Venia Minks today for tachycardia, . Patient states she noticed a change in her heart rate about a month ago. Dr. Venia Minks order blood work and a EKG.  The patient's history was personally reviewed.   HPI  Past Medical History  Diagnosis Date  . Anxiety   . COPD (chronic obstructive pulmonary disease) (HCC)     bronchitis  . 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 Cancer ([place]) 1995    vulvar, [facility]  . Breast cancer ([place]) 01/23/2014    Left breast, T1c, N0, M0. ER/PR +; Her 2 neu not overexpressed. Oncotype DX: 13,Low risk.9% over 10 years.     Past Surgical History  Procedure Laterality Date  . Abdominal hysterectomy  1978    partial  . Cholecystectomy    . Bladder suspension  2001  . Rotator cuff repair Right 1995  . Wide excision debriedment Left 04/10/14  . Breast surgery Left 01/23/14    wide excision   . Breast surgery  04/10/14    Debridement of fat necrosis  . Breast lumpectomy Left 01/23/2014    positive  . Breast biopsy Right 1975, 1985    benign cyst [name]) and fibroadenoma([facility])  . Breast biopsy Right 01/03/2014    Papilloma, no atypia detected on mammography.  . Breast biopsy Left 01/03/2014    Invasive mammary carcinoma.  . Breast biopsy Left 01/13/2015    ultrasound giuded biopsy, negative  . Breast cyst aspiration Left 1985    Family History  Problem Relation Age of Onset  . Diabetes Father   . COPD Mother   . Cancer Other     great great paternal aunt, ? age  . Cancer Maternal Grandfather 71    ? source  . Cancer Paternal Grandfather 106    ? source  . Heart failure Sister   . Emphysema Sister   . Diabetes Sister   . Hypertension Sister   . Emphysema Brother   .  Lung cancer Brother   . Crohn's disease Sister   . Hypertension Sister   . Hypertension Sister   . Emphysema Brother     Social History Social History  Substance Use Topics  . Smoking status: Former Smoker -- 1.00 packs/day for 50 years    Types: Cigarettes    Quit date: 09/15/2014  . Smokeless tobacco: Never Used  . Alcohol Use: 0.0 oz/week    0 Standard drinks or equivalent per week     Comment: 2/day    Allergies  Allergen Reactions  . Codeine Nausea And Vomiting  . Hydrochlorothiazide Swelling    Swelling of tongue.    Current Outpatient Prescriptions  Medication Sig Dispense Refill  . ALPRAZolam (XANAX XR) 0.5 MG 24 hr tablet Take 0.5 mg by mouth as needed for anxiety.    Marland Kitchen CALCIUM CITRATE Take by mouth.    Hendricks Limes CONTINUING MONTH PAK 1 MG tablet take 1 tablet by mouth twice a day (Patient taking differently: 1 tablet daily) 60 tablet 5  . Cholecalciferol 1000 UNITS capsule Take by mouth.    Marland Kitchen exemestane (AROMASIN) 25 MG tablet Take 1 tablet (25 mg total) by mouth daily. 30 tablet  6  . fluticasone (FLONASE) 50 MCG/ACT nasal spray   0  . Fluticasone-Salmeterol (ADVAIR DISKUS) 250-50 MCG/DOSE AEPB Inhale 1 puff into the lungs 2 (two) times daily. 60 each 5  . VITAMIN E PO Take 400 mg by mouth daily.      No current facility-administered medications for this visit.    Review of Systems Review of Systems  Constitutional: Negative.   Respiratory: Negative.   Cardiovascular: Negative.     Blood pressure 134/76, pulse 88, resp. rate 14, height '5\' 6"'$  (1.676 m), weight 235 lb (106.595 kg).  Physical Exam Physical Exam  Constitutional: She is oriented to person, place, and time. She appears well-developed and well-nourished.  Eyes: Conjunctivae are normal. No scleral icterus.  Neck: Neck supple.  Cardiovascular: Normal rate, regular rhythm and normal heart sounds.   Pulmonary/Chest: Effort normal and breath sounds normal. Right breast exhibits no inverted nipple,  no mass, no nipple discharge, no skin change and no tenderness. Left breast exhibits no inverted nipple, no mass, no nipple discharge, no skin change and no tenderness. Breasts are asymmetrical.    Left breast lumpectomy is well healed .   Lymphadenopathy:    She has no cervical adenopathy.  Neurological: She is alert and oriented to person, place, and time.  Skin: Skin is warm and dry.    Data Reviewed June  2016 mammograms reviewed. 6 month follow-up recommended. Considering extensive fat necrosis in previous surgery I recommended based bar in one year follow-up.  Assessment    Stable exam status post wide excision, debridement of fat necrosis.    Plan    Follow-up exam with bilateral diagnostic mammograms in 6 months.    PCP:  Wynn Banker 07/22/2015, 4:21 PM

## 2015-07-21 NOTE — Patient Instructions (Signed)
Patient will be asked to return to the office in eight months with a bilateral diagnotic mammogram. Continue self breast exams. Call office for any new breast issues or concerns.

## 2015-07-21 NOTE — Progress Notes (Signed)
Subjective:     Patient ID: Michelle Pitts, female   DOB: Nov 25, 1950, 64 y.o.   MRN: [id number]  Chief Complaint  Patient presents with  . Tachycardia    Patient reports that Dr. Manuella Ghazi wanted patient to have this re-evaluated. Patient reports that she is asymptomatic.     HPI Presents with tachycardia, chronic for at least 3 months. Seen in office on 9/30 HR was 118 at that time. On [facility] since November  twice a week (Tues, Thurs) and has had HR 101-122. Due to persistent tachycardia, rehab consulted oncologist Dr. Oliva Bustard who ordered rehab stopped until has work up.   During rehab, had labored breathing as expected during regular exercise.   Tachycardia has been asymptomatic. But this morning pt got up, used the bathroom, and felt heart beating fast, could hear it in her ears. Did not measure HR at that time. No chest pain, SOB, lightheadedness though was laying down, no dizziness, vision changes, hearing changes, nausea, vomiting, abd pain.  Pt is taking metoprolol and Chantix. She has not smoked since Feb 2016. Last used Albuterol in October. Not using Advair or Prednisone.Has had several rounds of prednisone in last few months.   Last evaluated with EKG in office with sinus tach. Breathing may be a little worse.   Was anemic as a child, has not been anemic since  hysterectomy in 1977. Gained 30 lbs over 1 year since taking Aromacin, despite decreased appetite - eating 1 small meal a day.   Denies leg swelling. Denies sudden panic sx. Denies waking up at night SOB. Tested for sleep apnea in [place] years ago, diagnosed with reflux during night, put on Zantac and her symptoms improved.    Review of Systems  Constitutional: Positive for unexpected weight change (30 lb weight gain since Dec 2015).  HENT: Negative.   Eyes: Negative.   Respiratory: Positive for shortness of breath (feels it is unchanged.  ).   Cardiovascular: Positive for palpitations.   Gastrointestinal: Negative.   Genitourinary: Negative.   Musculoskeletal: Negative.   Skin: Negative.   Neurological: Negative.   Hematological: Negative.   Psychiatric/Behavioral: Negative.      Patient Active Problem List   Diagnosis Date Noted  . Anxiety 05/15/2015  . Colon polyp 05/15/2015  . CAFL (chronic airflow limitation) ([place]) 05/15/2015  . Elevated blood sugar 05/15/2015  . Abnormal liver enzymes 05/15/2015  . Family history of diabetes mellitus 05/15/2015  . Acid reflux 05/15/2015  . Hypercholesteremia 05/15/2015  . BP (high blood pressure) 05/15/2015  . Cannot sleep 05/15/2015  . Apnea, sleep 05/15/2015  . Subclinical hypothyroidism 05/15/2015  . Avitaminosis D 05/15/2015  . 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 Left breast mass 01/14/2015  . 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 Breast cancer ([place]) 01/09/2014   Previous Medications   ALBUTEROL (PROVENTIL HFA;VENTOLIN HFA) 108 (90 BASE) MCG/ACT INHALER    Inhale 1 puff into the lungs every 6 (six) hours as needed for wheezing or shortness of breath.   ALPRAZOLAM (XANAX XR) 0.5 MG 24 HR TABLET    Take 0.5 mg by mouth as needed for anxiety.   CALCIUM CITRATE    Take by mouth.   CHANTIX CONTINUING MONTH PAK 1 MG TABLET    take 1 tablet by mouth twice a day   CHOLECALCIFEROL 1000 UNITS CAPSULE    Take by mouth.   EPINEPHRINE (EPIPEN 2-PAK) 0.3 MG/0.3 ML IJ SOAJ  INJECTION    EPIPEN 2-PAK, 0.'3MG'$ /0.3ML (Injection Solution Auto-injector)  1 (one) Soln Auto-inj Soln Aut Soln Auto-inj as directed for 0 days  Quantity: 1;  Refills: 3   Ordered :11-Jan-2013  Oletta Lamas ;  Started 11-Jan-2013 Active   EXEMESTANE (AROMASIN) 25 MG TABLET    Take 1 tablet (25 mg total) by mouth daily.   FLUTICASONE (FLONASE) 50 MCG/ACT NASAL SPRAY       METOPROLOL SUCCINATE (TOPROL-XL) 25 MG 24 HR TABLET       VITAMIN E PO    Take by mouth.   Allergies  Allergen Reactions  . Codeine  Nausea And Vomiting  . Hydrochlorothiazide Swelling    Swelling of tongue.   Past Surgical History  Procedure Laterality Date  . Abdominal hysterectomy  1978    partial  . Cholecystectomy    . Bladder suspension  2001  . Rotator cuff repair Right 1995  . Wide excision debriedment Left 04/10/14  . Breast surgery Left 01/23/14    wide excision   . Breast surgery  04/10/14    Debridement of fat necrosis  . Breast lumpectomy Left 01/23/2014    positive  . Breast biopsy Right 1975, 1985    benign cyst [name]) and fibroadenoma([facility])  . Breast biopsy Right 01/03/2014    Papilloma, no atypia detected on mammography.  . Breast biopsy Left 01/03/2014    Invasive mammary carcinoma.  . Breast biopsy Left 01/13/2015    ultrasound giuded biopsy, negative  . Breast cyst aspiration Left 1985   Family History  Problem Relation Age of Onset  . Diabetes Father   . COPD Mother   . Cancer Other     great great paternal aunt, ? age  . Cancer Maternal Grandfather 56    ? source  . Cancer Paternal Grandfather 57    ? source  . Heart failure Sister   . Emphysema Sister   . Diabetes Sister   . Hypertension Sister   . Emphysema Brother   . Lung cancer Brother   . Crohn's disease Sister   . Hypertension Sister   . Hypertension Sister   . Emphysema Brother    Social History   Social History  . Marital Status: Divorced    Spouse Name: N/A  . Number of Children: N/A  . Years of Education: N/A   Occupational History  . Not on file.   Social History Main Topics  . Smoking status: Former Smoker -- 1.00 packs/day for 50 years    Types: Cigarettes    Quit date: 09/15/2014  . Smokeless tobacco: Never Used  . Alcohol Use: 0.0 oz/week    0 Standard drinks or equivalent per week     Comment: 2/day  . Drug Use: No  . Sexual Activity: Not on file   Other Topics Concern  . Not on file   Social History Narrative      Objective:   Physical Exam  Constitutional: She is  oriented to person, place, and time. She appears well-developed and well-nourished. No distress.  HENT:  Head: Normocephalic and atraumatic.  Mouth/Throat: Oropharynx is clear and moist. No oropharyngeal exudate.  Eyes: EOM are normal. Pupils are equal, round, and reactive to light. Right eye exhibits no discharge. Left eye exhibits no discharge.  Neck: Normal range of motion. Neck supple. No thyromegaly present.  Cardiovascular: Regular rhythm and normal heart sounds.  Exam reveals no friction rub.   No murmur heard. tachycardic  Pulmonary/Chest: Effort normal and  breath sounds normal. No respiratory distress. She has no wheezes.  Some decreased breath sounds.   Abdominal: Soft. Bowel sounds are normal. She exhibits no distension.  Musculoskeletal: Normal range of motion. She exhibits no edema.  Neurological: She is alert and oriented to person, place, and time. No cranial nerve deficit. Coordination normal.  Skin: Skin is warm and dry. She is not diaphoretic.  Psychiatric: She has a normal mood and affect. Her behavior is normal.    BP 142/78 mmHg  Pulse 110  Temp(Src) 98.5 F (36.9 C)  Resp 18  Wt 235 lb (106.595 kg)  SpO2 96%     Assessment:     Tachycardia. Did not improve with treatment for bronchitis. Continued symptoms, worsening.   Plan:        1. Tachycardia EKG still shows sinus tachycardia.  Will check labs and treat for COPD.   - EKG 12-Lead - CBC with Differential/Platelet - TSH  2. Essential hypertension Stable. Check labs.  - Comprehensive metabolic panel  3. Chronic obstructive pulmonary disease with acute exacerbation (HCC) Worsening. Spirometry shows severe obstruction.   Suspect cause of tachycardia. Will start Advair and check labs.  Further plan pending these results.    - Spirometry with Graph - Fluticasone-Salmeterol (ADVAIR DISKUS) 250-50 MCG/DOSE AEPB; Inhale 1 puff into the lungs 2 (two) times daily.  Dispense: 60 each; Refill: 5  4.  Subclinical hypothyroidism Recheck labs.   5. Anxiety Stable. Does not seem to be cause of symptoms.    6. Shortness of breath Will check labs and further plan pending these results.  - B Nat Peptide - D-Dimer, Quantitative  Margarita Rana, MD

## 2015-07-22 ENCOUNTER — Telehealth: Payer: Self-pay

## 2015-07-22 DIAGNOSIS — N641 Fat necrosis of breast: Secondary | ICD-10-CM | POA: Insufficient documentation

## 2015-07-22 DIAGNOSIS — R000 Tachycardia, unspecified: Secondary | ICD-10-CM | POA: Insufficient documentation

## 2015-07-22 DIAGNOSIS — C50312 Malignant neoplasm of lower-inner quadrant of left female breast: Secondary | ICD-10-CM | POA: Insufficient documentation

## 2015-07-22 DIAGNOSIS — C50319 Malignant neoplasm of lower-inner quadrant of unspecified female breast: Secondary | ICD-10-CM | POA: Insufficient documentation

## 2015-07-22 LAB — CBC WITH DIFFERENTIAL/PLATELET
BASOS: 1 %
Basophils Absolute: 0 10*3/uL (ref 0.0–0.2)
EOS (ABSOLUTE): 0 10*3/uL (ref 0.0–0.4)
Eos: 1 %
Hematocrit: 39.8 % (ref 34.0–46.6)
Hemoglobin: 13.4 g/dL (ref 11.1–15.9)
Immature Grans (Abs): 0 10*3/uL (ref 0.0–0.1)
Immature Granulocytes: 0 %
Lymphocytes Absolute: 1.4 10*3/uL (ref 0.7–3.1)
Lymphs: 23 %
MCH: 31.7 pg (ref 26.6–33.0)
MCHC: 33.7 g/dL (ref 31.5–35.7)
MCV: 94 fL (ref 79–97)
MONOS ABS: 0.6 10*3/uL (ref 0.1–0.9)
Monocytes: 9 %
Neutrophils Absolute: 4.1 10*3/uL (ref 1.4–7.0)
Neutrophils: 66 %
Platelets: 265 10*3/uL (ref 150–379)
RBC: 4.23 x10E6/uL (ref 3.77–5.28)
RDW: 13.2 % (ref 12.3–15.4)
WBC: 6.2 10*3/uL (ref 3.4–10.8)

## 2015-07-22 LAB — COMPREHENSIVE METABOLIC PANEL
ALBUMIN: 4.6 g/dL (ref 3.6–4.8)
ALK PHOS: 126 IU/L — AB (ref 39–117)
ALT: 55 IU/L — ABNORMAL HIGH (ref 0–32)
AST: 43 IU/L — ABNORMAL HIGH (ref 0–40)
Albumin/Globulin Ratio: 2.2 (ref 1.1–2.5)
BILIRUBIN TOTAL: 0.4 mg/dL (ref 0.0–1.2)
BUN / CREAT RATIO: 17 (ref 11–26)
BUN: 13 mg/dL (ref 8–27)
CHLORIDE: 99 mmol/L (ref 97–106)
CO2: 26 mmol/L (ref 18–29)
Calcium: 10.1 mg/dL (ref 8.7–10.3)
Creatinine, Ser: 0.77 mg/dL (ref 0.57–1.00)
GFR calc Af Amer: 93 mL/min/{1.73_m2} (ref >59)
GFR calc non Af Amer: 81 mL/min/{1.73_m2} (ref >59)
GLUCOSE: 132 mg/dL — AB (ref 65–99)
Globulin, Total: 2.1 g/dL (ref 1.5–4.5)
Potassium: 4.4 mmol/L (ref 3.5–5.2)
Sodium: 142 mmol/L (ref 136–144)
Total Protein: 6.7 g/dL (ref 6.0–8.5)

## 2015-07-22 LAB — TSH: TSH: 2.89 u[IU]/mL (ref 0.450–4.500)

## 2015-07-22 LAB — D-DIMER, QUANTITATIVE: D-DIMER: 0.37 mg/L FEU (ref 0.00–0.49)

## 2015-07-22 LAB — BRAIN NATRIURETIC PEPTIDE: BNP: 10.6 pg/mL (ref 0.0–100.0)

## 2015-07-22 NOTE — Telephone Encounter (Signed)
Dr. Venia Minks Patient advised of results.  She said she feels fine but that she had all along.  She said that you had mentioned sending her to a heart or lung specialist or possibly changing BP meds.  Is there anything else you want to do with her? Thanks

## 2015-07-22 NOTE — Telephone Encounter (Signed)
-----   Message from Margarita Rana, MD sent at 07/22/2015  1:43 PM EST ----- Labs normal. NO anemia, blood clots or signs of heart failure. Please see how patient is doing.  Thanks.

## 2015-07-24 DIAGNOSIS — R0602 Shortness of breath: Secondary | ICD-10-CM | POA: Insufficient documentation

## 2015-07-26 ENCOUNTER — Other Ambulatory Visit: Payer: Self-pay | Admitting: Family Medicine

## 2015-09-08 ENCOUNTER — Telehealth: Payer: Self-pay | Admitting: *Deleted

## 2015-09-08 NOTE — Telephone Encounter (Signed)
Notified patient that annual lung cancer screening low dose CT scan is due. Confirmed that patient is within the age range of 55-77, and asymptomatic, (no signs or symptoms of lung cancer). The patient is a former smoker (quit 2016), with a 30 pack year history. The shared decision making visit was done 07/23/14 Patient is agreeable for CT scan being scheduled.

## 2015-10-27 ENCOUNTER — Inpatient Hospital Stay: Payer: Medicare Other | Attending: Oncology | Admitting: Oncology

## 2015-10-27 ENCOUNTER — Encounter: Payer: Self-pay | Admitting: Oncology

## 2015-10-27 ENCOUNTER — Inpatient Hospital Stay: Payer: Medicare Other

## 2015-10-27 VITALS — BP 177/98 | HR 88 | Temp 97.3°F | Resp 18 | Wt 235.5 lb

## 2015-10-27 DIAGNOSIS — F419 Anxiety disorder, unspecified: Secondary | ICD-10-CM | POA: Diagnosis not present

## 2015-10-27 DIAGNOSIS — Z923 Personal history of irradiation: Secondary | ICD-10-CM | POA: Insufficient documentation

## 2015-10-27 DIAGNOSIS — Z87891 Personal history of nicotine dependence: Secondary | ICD-10-CM | POA: Insufficient documentation

## 2015-10-27 DIAGNOSIS — Z809 Family history of malignant neoplasm, unspecified: Secondary | ICD-10-CM | POA: Diagnosis not present

## 2015-10-27 DIAGNOSIS — Z170 Estrogen receptor positive status [ER+]: Secondary | ICD-10-CM | POA: Insufficient documentation

## 2015-10-27 DIAGNOSIS — Z8589 Personal history of malignant neoplasm of other organs and systems: Secondary | ICD-10-CM

## 2015-10-27 DIAGNOSIS — C50312 Malignant neoplasm of lower-inner quadrant of left female breast: Secondary | ICD-10-CM

## 2015-10-27 DIAGNOSIS — Z79899 Other long term (current) drug therapy: Secondary | ICD-10-CM

## 2015-10-27 DIAGNOSIS — C50912 Malignant neoplasm of unspecified site of left female breast: Secondary | ICD-10-CM

## 2015-10-27 DIAGNOSIS — J449 Chronic obstructive pulmonary disease, unspecified: Secondary | ICD-10-CM

## 2015-10-27 DIAGNOSIS — Z79811 Long term (current) use of aromatase inhibitors: Secondary | ICD-10-CM

## 2015-10-27 LAB — COMPREHENSIVE METABOLIC PANEL
ALT: 64 U/L — ABNORMAL HIGH (ref 14–54)
ANION GAP: 6 (ref 5–15)
AST: 49 U/L — ABNORMAL HIGH (ref 15–41)
Albumin: 4.3 g/dL (ref 3.5–5.0)
Alkaline Phosphatase: 179 U/L — ABNORMAL HIGH (ref 38–126)
BILIRUBIN TOTAL: 0.6 mg/dL (ref 0.3–1.2)
BUN: 13 mg/dL (ref 6–20)
CHLORIDE: 105 mmol/L (ref 101–111)
CO2: 26 mmol/L (ref 22–32)
Calcium: 9.6 mg/dL (ref 8.9–10.3)
Creatinine, Ser: 0.81 mg/dL (ref 0.44–1.00)
Glucose, Bld: 142 mg/dL — ABNORMAL HIGH (ref 65–99)
POTASSIUM: 4.8 mmol/L (ref 3.5–5.1)
Sodium: 137 mmol/L (ref 135–145)
TOTAL PROTEIN: 7.8 g/dL (ref 6.5–8.1)

## 2015-10-27 LAB — CBC WITH DIFFERENTIAL/PLATELET
Basophils Absolute: 0 10*3/uL (ref 0–0.1)
Basophils Relative: 1 %
Eosinophils Absolute: 0.1 10*3/uL (ref 0–0.7)
Eosinophils Relative: 2 %
HEMATOCRIT: 39.1 % (ref 35.0–47.0)
Hemoglobin: 13.5 g/dL (ref 12.0–16.0)
LYMPHS PCT: 32 %
Lymphs Abs: 1.6 10*3/uL (ref 1.0–3.6)
MCH: 31.6 pg (ref 26.0–34.0)
MCHC: 34.4 g/dL (ref 32.0–36.0)
MCV: 91.8 fL (ref 80.0–100.0)
MONO ABS: 0.4 10*3/uL (ref 0.2–0.9)
MONOS PCT: 8 %
NEUTROS ABS: 2.9 10*3/uL (ref 1.4–6.5)
Neutrophils Relative %: 57 %
Platelets: 221 10*3/uL (ref 150–440)
RBC: 4.26 MIL/uL (ref 3.80–5.20)
RDW: 12.8 % (ref 11.5–14.5)
WBC: 5 10*3/uL (ref 3.6–11.0)

## 2015-10-27 NOTE — Progress Notes (Signed)
Michelle Pitts @ [facility] [phone]  Fax:([phone]) [place]: October 15, 1950  MR#: [id number]  VEL#:[id number]  Patient Care Team: Margarita Rana, MD as PCP - General (Family Medicine) Margarita Rana, MD as Referring Physician (Family Medicine) Robert Bellow, MD (General Surgery)  CHIEF COMPLAINT:  Chief Complaint  Patient presents with  . Breast Cancer   1.  Carcinoma of left breast stage IC N0 M0 tumor status post lumpectomy margins were close but clear.  Estrogen receptor positive progesterone receptor positive.  Status post radiation therapy finished in  November of 2015. Oncotype DX score is low. Irregular hypoechoic mass in the 8 o'clock location of the left  breast (diagnosis in July of 2015) 2.  Aromasin started on July 14, 2014. 3.  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  Patient had a biopsy in May of 2016 and abnormal area of the  Mammogram by Dr. Bary Castilla and has been reported to be negative for any malignancy   INTERVAL HISTORY:  65 year old lady came today further follow-up regarding carcinoma breast.  Had a recent mammogram reported to be negative.  Patient had a palpable area in the left breast which was evaluated by Dr. Tollie Pizza with ultrasound there was a benign lymph node and architectural distortions of biopsy was done which has been reported to be negative I do not have pathology report for my review.  Patient is taking Aromasin and tolerating very well taking calcium and vitamin D Needs bone density study.  Patient is taking Aromasin REVIEW OF SYSTEMS:   GENERAL:  Feels good.  Active.  No fevers, sweats or weight loss. PERFORMANCE STATUS (ECOG):  0 HEENT:  No visual changes, runny nose, sore throat, mouth sores or tenderness. Lungs: No shortness of breath or cough.  No hemoptysis. Cardiac:  No chest pain, palpitations, orthopnea, or PND. GI:  No nausea, vomiting, diarrhea, constipation, melena or  hematochezia. GU:  No urgency, frequency, dysuria, or hematuria. Musculoskeletal:  No back pain.  No joint pain.  No muscle tenderness. Extremities:  No pain or swelling. Skin:  No rashes or skin changes. Neuro:  No headache, numbness or weakness, balance or coordination issues. Endocrine:  No diabetes, thyroid issues, hot flashes or night sweats. Psych:  No mood changes, depression or anxiety. Pain:  No focal pain. Review of systems:  All other systems reviewed and found to be negative. As per HPI. Otherwise, a complete review of systems is negatve.  PAST MEDICAL HISTORY: Past Medical History  Diagnosis Date  . Anxiety   . COPD (chronic obstructive pulmonary disease) (HCC)     bronchitis  . 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 Cancer ([place]) 1995    vulvar, [facility]  . Breast cancer ([place]) 01/23/2014    Left breast, T1c, N0, M0. ER/PR +; Her 2 neu not overexpressed. Oncotype DX: 13,Low risk.9% over 10 years.     PAST SURGICAL HISTORY: Past Surgical History  Procedure Laterality Date  . Abdominal hysterectomy  1978    partial  . Cholecystectomy    . Bladder suspension  2001  . Rotator cuff repair Right 1995  . Wide excision debriedment Left 04/10/14  . Breast surgery Left 01/23/14    wide excision   . Breast surgery  04/10/14    Debridement of fat necrosis  . Breast lumpectomy Left 01/23/2014    positive  . Breast biopsy Right 1975, 1985    benign cyst [name]) and fibroadenoma([facility])  .  Breast biopsy Right 01/03/2014    Papilloma, no atypia detected on mammography.  . Breast biopsy Left 01/03/2014    Invasive mammary carcinoma.  . Breast biopsy Left 01/13/2015    ultrasound giuded biopsy, negative  . Breast cyst aspiration Left 1985    FAMILY HISTORY Family History  Problem Relation Age of Onset  . Diabetes Father   . COPD Mother   . Cancer Other     great great paternal aunt, ? age  . Cancer Maternal Grandfather 22    ? source  . Cancer Paternal Grandfather  52    ? source  . Heart failure Sister   . Emphysema Sister   . Diabetes Sister   . Hypertension Sister   . Emphysema Brother   . Lung cancer Brother   . Crohn's disease Sister   . Hypertension Sister   . Hypertension Sister   . Emphysema Brother     ADVANCED DIRECTIVES:  No flowsheet data found.  HEALTH MAINTENANCE: Social History  Substance Use Topics  . Smoking status: Former Smoker -- 1.00 packs/day for 50 years    Types: Cigarettes    Quit date: 09/15/2014  . Smokeless tobacco: Never Used  . Alcohol Use: 0.0 oz/week    0 Standard drinks or equivalent per week     Comment: 2/day      Allergies  Allergen Reactions  . Codeine Nausea And Vomiting  . Hydrochlorothiazide Swelling    Swelling of tongue.    Current Outpatient Prescriptions  Medication Sig Dispense Refill  . ALPRAZolam (XANAX XR) 0.5 MG 24 hr tablet Take 0.5 mg by mouth as needed for anxiety.    Marland Kitchen CALCIUM CITRATE Take by mouth.    Hendricks Limes CONTINUING MONTH PAK 1 MG tablet take 1 tablet by mouth twice a day (Patient taking differently: 1 tablet daily) 60 tablet 5  . Cholecalciferol 1000 UNITS capsule Take by mouth.    Marland Kitchen exemestane (AROMASIN) 25 MG tablet Take 1 tablet (25 mg total) by mouth daily. 30 tablet 6  . fluticasone (FLONASE) 50 MCG/ACT nasal spray   0  . Fluticasone-Salmeterol (ADVAIR DISKUS) 250-50 MCG/DOSE AEPB Inhale 1 puff into the lungs 2 (two) times daily. 60 each 5  . metoprolol succinate (TOPROL-XL) 25 MG 24 hr tablet take 1 tablet by mouth once daily 30 tablet 6  . VITAMIN E PO Take 400 mg by mouth daily.     . Cholecalciferol (VITAMIN D3) 1000 units CAPS Take by mouth.     No current facility-administered medications for this visit.    OBJECTIVE:  Filed Vitals:   10/27/15 1200  BP: 177/98  Pulse: 88  Temp: 97.3 F (36.3 C)  Resp: 18     Body mass index is 38.02 kg/(m^2).    ECOG FS:0 - Asymptomatic  PHYSICAL EXAM: GENERAL:  Well developed, well nourished, sitting  comfortably in the exam room in no acute distress. MENTAL STATUS:  Alert and oriented to person, place and time.  RESPIRATORY:  Clear to auscultation without rales, wheezes or rhonchi. CARDIOVASCULAR:  Regular rate and rhythm without murmur, rub or gallop. BREAST:  Right breast without masses, skin changes or nipple discharge.  Left breast without masses, skin changes or nipple discharge.  Left breast lower and inner quadrant has radiation changes ABDOMEN:  Soft, non-tender, with active bowel sounds, and no hepatosplenomegaly.  No masses. BACK:  No CVA tenderness.  No tenderness on percussion of the back or rib cage. SKIN:  No rashes, ulcers  or lesions. EXTREMITIES: No edema, no skin discoloration or tenderness.  No palpable cords. LYMPH NODES: No palpable cervical, supraclavicular, axillary or inguinal adenopathy  NEUROLOGICAL: Unremarkable. PSYCH:  Appropriate.   LAB RESULTS:  CBC Latest Ref Rng 10/27/2015 07/21/2015  WBC 3.6 - 11.0 K/uL 5.0 6.2  Hemoglobin 12.0 - 16.0 g/dL 13.5 -  Hematocrit 35.0 - 47.0 % 39.1 39.8  Platelets 150 - 440 K/uL 221 265    Appointment on 10/27/2015  Component Date Value Ref Range Status  . WBC 10/27/2015 5.0  3.6 - 11.0 K/uL Final  . RBC 10/27/2015 4.26  3.80 - 5.20 MIL/uL Final  . Hemoglobin 10/27/2015 13.5  12.0 - 16.0 g/dL Final  . HCT 10/27/2015 39.1  35.0 - 47.0 % Final  . MCV 10/27/2015 91.8  80.0 - 100.0 fL Final  . MCH 10/27/2015 31.6  26.0 - 34.0 pg Final  . MCHC 10/27/2015 34.4  32.0 - 36.0 g/dL Final  . RDW 10/27/2015 12.8  11.5 - 14.5 % Final  . Platelets 10/27/2015 221  150 - 440 K/uL Final  . Neutrophils Relative % 10/27/2015 57   Final  . Neutro Abs 10/27/2015 2.9  1.4 - 6.5 K/uL Final  . Lymphocytes Relative 10/27/2015 32   Final  . Lymphs Abs 10/27/2015 1.6  1.0 - 3.6 K/uL Final  . Monocytes Relative 10/27/2015 8   Final  . Monocytes Absolute 10/27/2015 0.4  0.2 - 0.9 K/uL Final  . Eosinophils Relative 10/27/2015 2   Final  .  Eosinophils Absolute 10/27/2015 0.1  0 - 0.7 K/uL Final  . Basophils Relative 10/27/2015 1   Final  . Basophils Absolute 10/27/2015 0.0  0 - 0.1 K/uL Final  . Sodium 10/27/2015 137  135 - 145 mmol/L Final  . Potassium 10/27/2015 4.8  3.5 - 5.1 mmol/L Final  . Chloride 10/27/2015 105  101 - 111 mmol/L Final  . CO2 10/27/2015 26  22 - 32 mmol/L Final  . Glucose, Bld 10/27/2015 142* 65 - 99 mg/dL Final  . BUN 10/27/2015 13  6 - 20 mg/dL Final  . Creatinine, Ser 10/27/2015 0.81  0.44 - 1.00 mg/dL Final  . Calcium 10/27/2015 9.6  8.9 - 10.3 mg/dL Final  . Total Protein 10/27/2015 7.8  6.5 - 8.1 g/dL Final  . Albumin 10/27/2015 4.3  3.5 - 5.0 g/dL Final  . AST 10/27/2015 49* 15 - 41 U/L Final  . ALT 10/27/2015 64* 14 - 54 U/L Final  . Alkaline Phosphatase 10/27/2015 179* 38 - 126 U/L Final  . Total Bilirubin 10/27/2015 0.6  0.3 - 1.2 mg/dL Final  . GFR calc non Af Amer 10/27/2015 >60  >60 mL/min Final  . GFR calc Af Amer 10/27/2015 >60  >60 mL/min Final   Comment: (NOTE) The eGFR has been calculated using the CKD EPI equation. This calculation has not been validated in all clinical situations. eGFR's persistently <60 mL/min signify possible Chronic Kidney Disease.   . Anion gap 10/27/2015 6  5 - 15 Final      STUDIES: In June of 2016 Patient had a mammogram Reported to be negative.  But 6 month follow-up has been recommended. ASSESSMENT: Carcinoma of breast on Aromasin and arrange for bone density.   I believe it up to Dr. Bary Castilla to arrange for next mammogram depending on his clinical judgment.   MEDICAL DECISION MAKING:  Continue Aromasin calcium and vitamin D  Patient expressed understanding and was in agreement with this plan. She also understands that  She can call clinic at any time with any questions, concerns, or complaints.    No matching staging information was found for the patient.  Forest Gleason, MD   10/27/2015 12:19 PM

## 2015-10-30 ENCOUNTER — Encounter: Payer: Self-pay | Admitting: Oncology

## 2015-11-12 ENCOUNTER — Encounter: Payer: Self-pay | Admitting: *Deleted

## 2015-11-18 ENCOUNTER — Encounter: Payer: Self-pay | Admitting: Family Medicine

## 2015-11-18 ENCOUNTER — Other Ambulatory Visit: Payer: Self-pay | Admitting: Family Medicine

## 2015-11-18 DIAGNOSIS — Z87891 Personal history of nicotine dependence: Secondary | ICD-10-CM

## 2015-11-18 HISTORY — DX: Personal history of nicotine dependence: Z87.891

## 2015-12-02 ENCOUNTER — Ambulatory Visit
Admission: RE | Admit: 2015-12-02 | Discharge: 2015-12-02 | Disposition: A | Discharge status: Home / Self Care | Payer: Medicare Other | Source: Ambulatory Visit | Attending: Family Medicine | Admitting: Family Medicine

## 2015-12-02 DIAGNOSIS — R918 Other nonspecific abnormal finding of lung field: Secondary | ICD-10-CM | POA: Diagnosis not present

## 2015-12-02 DIAGNOSIS — Z9049 Acquired absence of other specified parts of digestive tract: Secondary | ICD-10-CM | POA: Diagnosis not present

## 2015-12-02 DIAGNOSIS — J432 Centrilobular emphysema: Secondary | ICD-10-CM | POA: Diagnosis not present

## 2015-12-02 DIAGNOSIS — I2510 Atherosclerotic heart disease of native coronary artery without angina pectoris: Secondary | ICD-10-CM | POA: Diagnosis not present

## 2015-12-02 DIAGNOSIS — Z87891 Personal history of nicotine dependence: Secondary | ICD-10-CM | POA: Diagnosis present

## 2016-01-28 ENCOUNTER — Ambulatory Visit
Admission: RE | Admit: 2016-01-28 | Discharge: 2016-01-28 | Disposition: A | Discharge status: Home / Self Care | Payer: Medicare Other | Source: Ambulatory Visit | Attending: Oncology | Admitting: Oncology

## 2016-01-28 DIAGNOSIS — Z853 Personal history of malignant neoplasm of breast: Secondary | ICD-10-CM | POA: Insufficient documentation

## 2016-01-28 DIAGNOSIS — C50312 Malignant neoplasm of lower-inner quadrant of left female breast: Secondary | ICD-10-CM

## 2016-02-04 ENCOUNTER — Ambulatory Visit (INDEPENDENT_AMBULATORY_CARE_PROVIDER_SITE_OTHER): Payer: Medicare Other | Admitting: General Surgery

## 2016-02-04 ENCOUNTER — Encounter: Payer: Self-pay | Admitting: General Surgery

## 2016-02-04 VITALS — BP 150/80 | HR 90 | Resp 15 | Ht 66.0 in | Wt 235.0 lb

## 2016-02-04 DIAGNOSIS — C50312 Malignant neoplasm of lower-inner quadrant of left female breast: Secondary | ICD-10-CM

## 2016-02-04 MED ORDER — EXEMESTANE 25 MG PO TABS: 25.0000 mg | ORAL_TABLET | Freq: Every day | ORAL | Status: DC

## 2016-02-04 NOTE — Patient Instructions (Addendum)
Continue self breast exams. Call office for any new breast issues or concerns. Follow up in one year with mammogram and office visit.   Call with a report of how you are tolerating your new medication in one month.

## 2016-02-04 NOTE — Progress Notes (Addendum)
Patient ID: Michelle Pitts, female   DOB: [birth year], 65 y.o.   MRN: [id number]  Chief Complaint  Patient presents with  . Follow-up    mammogram    HPI Michelle Pitts is a 65 y.o. female with a history of left breast cancer who presents for a breast evaluation. The most recent mammogram was done on 01/28/16. Patient does perform regular self breast checks and gets regular mammograms done. She reports no new breast concerns.     Since her last visit the patient discontinued Aromasin in February 2017. She had had episodes of tachycardia and this did not recur when the medication was discontinued. Prior to this she had been placed on tamoxifen but found this resulted in unacceptable waking as well as a poor sleep pattern. At present she is making use of no antiestrogen therapy. She is aware that the indication to avoid chemotherapy was based on the use of 5 years of an antiestrogen treatment.  The patient reported that she did not want to take anything that might result in recurrent hot flashes.  I personally reviewed the patient's history.   HPI  Past Medical History  Diagnosis Date  . Anxiety   . COPD (chronic obstructive pulmonary disease) (HCC)     bronchitis  . 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 Cancer ([place]) 1995    vulvar, [facility]  . Breast cancer ([place]) 01/23/2014    Left breast, T1c, N0, M0. ER/PR +; Her 2 neu not overexpressed. Oncotype DX: 13,Low risk.9% over 10 years.   . Personal history of tobacco use, presenting hazards to health 11/18/2015    Past Surgical History  Procedure Laterality Date  . Cholecystectomy    . Bladder suspension  2001  . Rotator cuff repair Right 1995  . Wide excision debriedment Left 04/10/14  . Breast surgery Left 01/23/14    wide excision   . Breast surgery  04/10/14    Debridement of fat necrosis  . Breast lumpectomy Left 01/23/2014    positive  . Breast biopsy Right 1975, 1985    benign cyst Michelle Pitts) and fibroadenoma([facility])  .  Breast biopsy Right 01/03/2014    Papilloma, no atypia detected on mammography.  . Breast biopsy Left 01/03/2014    Invasive mammary carcinoma.  . Breast biopsy Left 01/13/2015    ultrasound giuded biopsy, negative  . Breast cyst aspiration Left 1985  . Vaginal hysterectomy  1978    partial  . Colonoscopy  2014    Dr Tiffany Kocher    Family History  Problem Relation Age of Onset  . Diabetes Father   . COPD Mother   . Cancer Other     great great paternal aunt, ? age  . Cancer Maternal Grandfather 57    ? source  . Cancer Paternal Grandfather 13    ? source  . Heart failure Sister   . Emphysema Sister   . Diabetes Sister   . Hypertension Sister   . Emphysema Brother   . Lung cancer Brother   . Crohn's disease Sister   . Hypertension Sister   . Hypertension Sister   . Emphysema Brother   . Breast cancer Neg Hx     Social History Social History  Substance Use Topics  . Smoking status: Former Smoker -- 1.00 packs/day for 50 years    Types: Cigarettes    Quit date: 09/15/2014  . Smokeless tobacco: Never Used  . Alcohol Use: 0.0 oz/week    0 Standard drinks  or equivalent per week     Comment: 2/day    Allergies  Allergen Reactions  . Codeine Nausea And Vomiting  . Hydrochlorothiazide Swelling    Swelling of tongue.    Current Outpatient Prescriptions  Medication Sig Dispense Refill  . ALPRAZolam (XANAX XR) 0.5 MG 24 hr tablet Take 0.5 mg by mouth as needed for anxiety.    Hendricks Limes CONTINUING MONTH PAK 1 MG tablet take 1 tablet by mouth twice a day (Patient taking differently: 1 tablet daily) 60 tablet 5  . metoprolol succinate (TOPROL-XL) 25 MG 24 hr tablet take 1 tablet by mouth once daily 30 tablet 6  . exemestane (AROMASIN) 25 MG tablet Take 1 tablet (25 mg total) by mouth daily after breakfast. 30 tablet 11   No current facility-administered medications for this visit.    Review of Systems Review of Systems  Constitutional: Negative.   Respiratory: Negative.    Cardiovascular: Negative.     Blood pressure 150/80, pulse 90, resp. rate 15, height '5\' 6"'$  (1.676 m), weight 235 lb (106.595 kg).  Physical Exam Physical Exam  Constitutional: She is oriented to person, place, and time. She appears well-developed and well-nourished.  Eyes: Conjunctivae are normal. No scleral icterus.  Neck: Neck supple.  Cardiovascular: Normal rate, regular rhythm and normal heart sounds.   Pulmonary/Chest: Effort normal and breath sounds normal. Right breast exhibits no inverted nipple, no mass, no nipple discharge, no skin change and no tenderness. Left breast exhibits no inverted nipple, no mass, no nipple discharge, no skin change and no tenderness.    Well healed incision left breast.   Lymphadenopathy:    She has no cervical adenopathy.  Neurological: She is alert and oriented to person, place, and time.  Skin: Skin is warm and dry.  Psychiatric: She has a normal mood and affect.    Data Reviewed Medical oncology notes from Spring 2017.  Bilateral mammograms dated 01/28/2016 were reviewed. Postsurgical changes. BI-RADS-2.  Assessment    No evidence of recurrent breast cancer. Increased risk for recurrent disease off antiestrogen therapy.     Plan    The patient is concerned that any of the "cousins" of Aromasin will cause the same cardiovascular symptoms. I think this is unlikely. I've asked her if she would be willing to make use of a one-month trial of exemestane If she does not have recurrent cardiovascular symptoms, can consider the addition of Effexor if her vasomotor symptoms worsen.  The patient has been asked to give a phone follow-up in one month regarding her tolerance of this medication. Over 50% of the visit was spent reviewing the indication for ongoing antiestrogen therapy to minimize her risk for recurrent or contralateral cancer.      This has been scribed by Lesly Rubenstein LPN  PCP: Edison Nasuti   Robert Bellow 02/04/2016, 9:03 PM

## 2016-02-06 ENCOUNTER — Encounter: Payer: Self-pay | Admitting: General Surgery

## 2016-02-08 ENCOUNTER — Other Ambulatory Visit: Payer: Self-pay | Admitting: Family Medicine

## 2016-02-08 ENCOUNTER — Encounter: Payer: Self-pay | Admitting: Family Medicine

## 2016-02-08 ENCOUNTER — Ambulatory Visit (INDEPENDENT_AMBULATORY_CARE_PROVIDER_SITE_OTHER): Payer: Medicare Other | Admitting: Family Medicine

## 2016-02-08 VITALS — BP 128/78 | HR 80 | Temp 98.7°F | Resp 16 | Ht 65.0 in | Wt 239.0 lb

## 2016-02-08 DIAGNOSIS — E038 Other specified hypothyroidism: Secondary | ICD-10-CM | POA: Diagnosis not present

## 2016-02-08 DIAGNOSIS — R7309 Other abnormal glucose: Secondary | ICD-10-CM | POA: Diagnosis not present

## 2016-02-08 DIAGNOSIS — Z720 Tobacco use: Secondary | ICD-10-CM

## 2016-02-08 DIAGNOSIS — R739 Hyperglycemia, unspecified: Secondary | ICD-10-CM

## 2016-02-08 DIAGNOSIS — Z66 Do not resuscitate: Secondary | ICD-10-CM

## 2016-02-08 DIAGNOSIS — Z23 Encounter for immunization: Secondary | ICD-10-CM | POA: Diagnosis not present

## 2016-02-08 DIAGNOSIS — E559 Vitamin D deficiency, unspecified: Secondary | ICD-10-CM

## 2016-02-08 DIAGNOSIS — Z0000 Encounter for general adult medical examination without abnormal findings: Secondary | ICD-10-CM

## 2016-02-08 DIAGNOSIS — F419 Anxiety disorder, unspecified: Secondary | ICD-10-CM

## 2016-02-08 DIAGNOSIS — I10 Essential (primary) hypertension: Secondary | ICD-10-CM

## 2016-02-08 DIAGNOSIS — E7800 Pure hypercholesterolemia, unspecified: Secondary | ICD-10-CM | POA: Diagnosis not present

## 2016-02-08 DIAGNOSIS — E039 Hypothyroidism, unspecified: Secondary | ICD-10-CM

## 2016-02-08 MED ORDER — VARENICLINE TARTRATE 1 MG PO TABS: 1.0000 mg | ORAL_TABLET | Freq: Two times a day (BID) | ORAL | Status: DC

## 2016-02-08 MED ORDER — ALPRAZOLAM ER 0.5 MG PO TB24: 0.5000 mg | ORAL_TABLET | Freq: Two times a day (BID) | ORAL | Status: DC | PRN

## 2016-02-08 MED ORDER — METOPROLOL SUCCINATE ER 25 MG PO TB24: 25.0000 mg | ORAL_TABLET | Freq: Every day | ORAL | Status: DC

## 2016-02-08 NOTE — Progress Notes (Addendum)
Patient: Michelle Pitts, Female    DOB: [birth year], 65 y.o.   MRN: [id number] Visit Date: 02/10/2016  Today's Provider: Margarita Rana, MD   Chief Complaint  Patient presents with  . Medicare Wellness   Subjective:    Annual wellness visit Michelle Pitts is a 65 y.o. female. She feels well. She reports not exercising recently. She reports she is sleeping well.  -----------------------------------------------------------   Review of Systems  Constitutional: Negative.   HENT: Positive for tinnitus. Negative for congestion, dental problem, drooling, ear discharge, ear pain, facial swelling, hearing loss, mouth sores, nosebleeds, postnasal drip, rhinorrhea, sinus pressure, sneezing, sore throat, trouble swallowing and voice change.   Eyes: Negative.   Respiratory: Negative.   Cardiovascular: Negative.   Gastrointestinal: Negative.   Endocrine: Negative.   Genitourinary: Positive for enuresis. Negative for dysuria, urgency, frequency, hematuria, flank pain, decreased urine volume, vaginal bleeding, vaginal discharge, difficulty urinating, genital sores, vaginal pain, menstrual problem, pelvic pain and dyspareunia.  Musculoskeletal: Negative.   Skin: Negative.   Allergic/Immunologic: Negative.   Neurological: Negative.   Hematological: Negative.   Psychiatric/Behavioral: Negative.     Social History   Social History  . Marital Status: Divorced    Spouse Name: N/A  . Number of Children: N/A  . Years of Education: N/A   Occupational History  . Not on file.   Social History Main Topics  . Smoking status: Former Smoker -- 1.00 packs/day for 50 years    Types: Cigarettes    Quit date: 09/15/2014  . Smokeless tobacco: Never Used  . Alcohol Use: 0.0 oz/week    0 Standard drinks or equivalent per week     Comment: 2/day  . Drug Use: No  . Sexual Activity: Not on file   Other Topics Concern  . Not on file   Social History Narrative    Past Medical  History  Diagnosis Date  . Anxiety   . COPD (chronic obstructive pulmonary disease) (HCC)     bronchitis  . 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 Cancer ([place]) 1995    vulvar, [facility]  . Breast cancer ([place]) 01/23/2014    Left breast, T1c, N0, M0. ER/PR +; Her 2 neu not overexpressed. Oncotype DX: 13,Low risk.9% over 10 years.   . Personal history of tobacco use, presenting hazards to health 11/18/2015     Patient Active Problem List   Diagnosis Date Noted  . DNR no code (do not resuscitate) 02/08/2016  . Personal history of tobacco use, presenting hazards to health 11/18/2015  . Breathlessness on exertion 07/24/2015  . Malignant neoplasm of lower-inner quadrant of left female breast ([place]) 07/22/2015  . Tachycardia 07/22/2015  . Fast heart beat 07/22/2015  . Anxiety 05/15/2015  . Colon polyp 05/15/2015  . CAFL (chronic airflow limitation) ([place]) 05/15/2015  . Elevated blood sugar 05/15/2015  . Abnormal liver enzymes 05/15/2015  . Family history of diabetes mellitus 05/15/2015  . Acid reflux 05/15/2015  . Hypercholesteremia 05/15/2015  . BP (high blood pressure) 05/15/2015  . Cannot sleep 05/15/2015  . Apnea, sleep 05/15/2015  . Subclinical hypothyroidism 05/15/2015  . Avitaminosis D 05/15/2015  . 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 Abnormal laboratory test 05/15/2015  . Anxiety disorder 05/15/2015  . Chronic obstructive pulmonary disease ([place]) 05/15/2015  . Essential (primary) hypertension 05/15/2015  . Gastro-esophageal reflux disease without esophagitis 05/15/2015  . 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 Abnormal blood sugar 05/15/2015  . Adult hypothyroidism  05/15/2015  . Pure hypercholesterolemia 05/15/2015  . 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 Nicotine addiction 03/09/2015    Past Surgical History  Procedure Laterality Date  . Cholecystectomy    . Bladder suspension  2001  . Rotator cuff repair Right 1995  . Wide excision debriedment Left 04/10/14  .  Breast surgery Left 01/23/14    wide excision   . Breast surgery  04/10/14    Debridement of fat necrosis  . Breast lumpectomy Left 01/23/2014    positive  . Breast biopsy Right 1975, 1985    benign cyst [name]) and fibroadenoma([facility])  . Breast biopsy Right 01/03/2014    Papilloma, no atypia detected on mammography.  . Breast biopsy Left 01/03/2014    Invasive mammary carcinoma.  . Breast biopsy Left 01/13/2015    ultrasound giuded biopsy, negative  . Breast cyst aspiration Left 1985  . Vaginal hysterectomy  1978    partial  . Colonoscopy  2014    Dr Tiffany Kocher    Her family history includes COPD in her mother; Cancer in her other; Cancer (age of onset: 25) in her maternal grandfather; Cancer (age of onset: 37) in her paternal grandfather; Crohn's disease in her sister; Diabetes in her father and sister; Emphysema in her brother, brother, and sister; Heart failure in her sister; Hypertension in her sister, sister, and sister; Lung cancer in her brother. There is no history of Breast cancer.    Current Meds  Medication Sig  . ALPRAZolam (XANAX XR) 0.5 MG 24 hr tablet Take 1 tablet (0.5 mg total) by mouth 2 (two) times daily as needed for anxiety.  Marland Kitchen exemestane (AROMASIN) 25 MG tablet Take 1 tablet (25 mg total) by mouth daily after breakfast.  . metoprolol succinate (TOPROL-XL) 25 MG 24 hr tablet Take 1 tablet (25 mg total) by mouth daily.  . varenicline (CHANTIX CONTINUING MONTH PAK) 1 MG tablet Take 1 tablet (1 mg total) by mouth 2 (two) times daily.    Patient Care Team: Margarita Rana, MD as PCP - General (Family Medicine) Margarita Rana, MD as Referring Physician (Family Medicine) Robert Bellow, MD (General Surgery)    Objective:   Vitals: BP 128/78 mmHg  Pulse 80  Temp(Src) 98.7 F (37.1 C) (Oral)  Resp 16  Ht '5\' 5"'$  (1.651 m)  Wt 239 lb (108.41 kg)  BMI 39.77 kg/m2  Physical Exam  Constitutional: She is oriented to person, place, and time. She appears  well-developed and well-nourished.  HENT:  Head: Normocephalic and atraumatic.  Right Ear: External ear normal.  Left Ear: External ear normal.  Nose: Nose normal.  Mouth/Throat: Oropharynx is clear and moist.  Neck: Normal range of motion. Neck supple. Carotid bruit is not present.  Cardiovascular: Normal rate, regular rhythm and normal heart sounds.   Pulmonary/Chest: Effort normal and breath sounds normal.  Neurological: She is alert and oriented to person, place, and time.  Skin: Skin is warm and dry.  Psychiatric: She has a normal mood and affect. Her behavior is normal. Judgment and thought content normal.    Activities of Daily Living In your present state of health, do you have any difficulty performing the following activities: 02/08/2016  Hearing? N  Vision? N  Difficulty concentrating or making decisions? N  Walking or climbing stairs? N  Dressing or bathing? N  Doing errands, shopping? N    Fall Risk Assessment Fall Risk  02/08/2016  Falls in the past year? No     Depression  Screen PHQ 2/9 Scores 02/08/2016 02/11/2015  PHQ - 2 Score 4 0  PHQ- 9 Score 10 -    Cognitive Testing - 6-CIT  Correct? Score   What year is it? yes 0 0 or 4  What month is it? yes 0 0 or 3  Memorize:    Pia Mau,  40,  [address],  [place],      What time is it? (within 1 hour) yes 0 0 or 3  Count backwards from 20 yes 0 0, 2, or 4  Name the months of the year yes 0 0, 2, or 4  Repeat name & address above no 3 0, 2, 4, 6, 8, or 10       TOTAL SCORE  3/28   Interpretation:  Normal  Normal (0-7) Abnormal (8-28)       Assessment & Plan:     Annual Wellness Visit  Reviewed patient's Family Medical History Reviewed and updated list of patient's medical providers Assessment of cognitive impairment was done Assessed patient's functional ability Established a written schedule for health screening [place] Completed and Reviewed  Exercise Activities and  Dietary recommendations Goals    None      Immunization History  Administered Date(s) Administered  . Influenza, High Dose Seasonal PF 06/25/2015  . Pneumococcal Conjugate-13 05/16/2014  . Pneumococcal Polysaccharide-23 06/16/2007, 02/08/2016  . Zoster 09/17/2007    Health Maintenance  Topic Date Due  . Hepatitis C Screening  May 13, 1951  . TETANUS/TDAP  05/14/1968  . INFLUENZA VACCINE  03/15/2016  . MAMMOGRAM  01/27/2018  . COLONOSCOPY  04/05/2023  . DEXA SCAN  Completed  . ZOSTAVAX  Completed  . PNA vac Low Risk Adult  Completed      Discussed health benefits of physical activity, and encouraged her to engage in regular exercise appropriate for her age and condition.    ------------------------------------------------------------------------------------------------------------ 1. Medicare annual wellness visit, subsequent Eat healthy, increase exercise. Will work on weight loss.  Patient is no code/DNR. Filled out golden rod form today.   - Pneumococcal polysaccharide vaccine 23-valent greater than or equal to 2yo subcutaneous/IM  2. Tobacco abuse Condition is stable. Please continue current medication and  plan of care as noted.   - varenicline (CHANTIX CONTINUING MONTH PAK) 1 MG tablet; Take 1 tablet (1 mg total) by mouth 2 (two) times daily.  Dispense: 60 tablet; Refill: 5  3. Anxiety Stable. Will refill medication.   - ALPRAZolam (XANAX XR) 0.5 MG 24 hr tablet; Take 1 tablet (0.5 mg total) by mouth 2 (two) times daily as needed for anxiety.  Dispense: 60 tablet; Refill: 5  4. Essential hypertension Condition is stable. Please continue current medication and  plan of care as noted.   - metoprolol succinate (TOPROL-XL) 25 MG 24 hr tablet; Take 1 tablet (25 mg total) by mouth daily.  Dispense: 90 tablet; Refill: 1 - CBC with Differential/Platelet - TSH  5. Subclinical hypothyroidism Condition is stable. Please continue current medication and  plan of care as noted.    - CBC with Differential/Platelet  6. Avitaminosis D - CBC with Differential/Platelet - VITAMIN D 25 Hydroxy (Vit-D Deficiency, Fractures)  7. Hypercholesteremia - CBC with Differential/Platelet  8. Elevated blood sugar Will check labs.   - Hemoglobin A1c - Comprehensive metabolic panel  9. Pure hypercholesterolemia - Hemoglobin A1c - Comprehensive metabolic panel - Lipid panel  Patient was seen and examined by Jerrell Belfast, MD, and note scribed by Ashley Royalty,  CMA.  I have reviewed the document for accuracy and completeness and I agree with above. - Jerrell Belfast, MD     Margarita Rana, MD  [facility]

## 2016-02-08 NOTE — Progress Notes (Deleted)
        Patient: Michelle Pitts Female    DOB: [birth year]   65 y.o.   MRN: [id number] Visit Date: 02/08/2016  Today's Provider: Margarita Rana, MD   Chief Complaint  Patient presents with  . Medicare Wellness   Subjective:    HPI     Allergies  Allergen Reactions  . Codeine Nausea And Vomiting  . Hydrochlorothiazide Swelling    Swelling of tongue.   No outpatient prescriptions have been marked as taking for the 02/08/16 encounter (Office Visit) with Margarita Rana, MD.    Review of Systems  Social History  Substance Use Topics  . Smoking status: Former Smoker -- 1.00 packs/day for 50 years    Types: Cigarettes    Quit date: 09/15/2014  . Smokeless tobacco: Never Used  . Alcohol Use: 0.0 oz/week    0 Standard drinks or equivalent per week     Comment: 2/day   Objective:   There were no vitals taken for this visit.  Physical Exam      Assessment & Plan:           Margarita Rana, MD  [facility]

## 2016-02-08 NOTE — Patient Instructions (Signed)
Advance Directive Advance directives are the legal documents that allow you to make choices about your health care and medical treatment if you cannot speak for yourself. Advance directives are a way for you to communicate your wishes to family, friends, and health care providers. The specified people can then convey your decisions about end-of-life care to avoid confusion if you should become unable to communicate. Ideally, the process of discussing and writing advance directives should happen over time rather than making decisions all at once. Advance directives can be modified as your situation changes, and you can change your mind at any time, even after you have signed the advance directives. Each state has its own laws regarding advance directives. You may want to check with your health care provider, attorney, or state representative about the law in your state. Below are some examples of advance directives. LIVING WILL A living will is a set of instructions documenting your wishes about medical care when you cannot care for yourself. It is used if you become:  Terminally ill.  Incapacitated.  Unable to communicate.  Unable to make decisions. Items to consider in your living will include:  The use or non-use of life-sustaining equipment, such as dialysis machines and breathing machines (ventilators).  A do not resuscitate (DNR) order, which is the instruction not to use cardiopulmonary resuscitation (CPR) if breathing or heartbeat stops.  Tube feeding.  Withholding of food and fluids.  Comfort (palliative) care when the goal becomes comfort rather than a cure.  Organ and tissue donation. A living will does not give instructions about distribution of your money and property if you should pass away. It is advisable to seek the expert advice of a lawyer in drawing up a will regarding your possessions. Decisions about taxes, beneficiaries, and asset distribution will be legally binding.  This process can relieve your family and friends of any burdens surrounding disputes or questions that may come up about the allocation of your assets. DO NOT RESUSCITATE (DNR) A do not resuscitate (DNR) order is a request to not have CPR in the event that your heart stops beating or you stop breathing. Unless given other instructions, a health care provider will try to help any patient whose heart has stopped or who has stopped breathing.  HEALTH CARE PROXY AND DURABLE POWER OF ATTORNEY FOR HEALTH CARE A health care proxy is a person (agent) appointed to make medical decisions for you if you cannot. Generally, people choose someone they know well and trust to represent their preferences when they can no longer do so. You should be sure to ask this person for agreement to act as your agent. An agent may have to exercise judgment in the event of a medical decision for which your wishes are not known. The durable power of attorney for health care is the legal document that names your health care proxy. Once written, it should be:  Signed.  Notarized.  Dated.  Copied.  Witnessed.  Incorporated into your medical record. You may also want to appoint someone to manage your financial affairs if you cannot. This is called a durable power of attorney for finances. It is a separate legal document from the durable power of attorney for health care. You may choose the same person or someone different from your health care proxy to act as your agent in financial matters.   This information is not intended to replace advice given to you by your health care provider. Make sure you discuss any  questions you have with your health care provider.   Document Released: 11/08/2007 Document Revised: 08/06/2013 Document Reviewed: 12/19/2012 Elsevier Interactive Patient Education [employer].

## 2016-02-09 LAB — CBC WITH DIFFERENTIAL/PLATELET
BASOS ABS: 0 10*3/uL (ref 0.0–0.2)
Basos: 1 %
EOS (ABSOLUTE): 0.1 10*3/uL (ref 0.0–0.4)
EOS: 1 %
HEMATOCRIT: 37.8 % (ref 34.0–46.6)
HEMOGLOBIN: 12.5 g/dL (ref 11.1–15.9)
IMMATURE GRANS (ABS): 0 10*3/uL (ref 0.0–0.1)
IMMATURE GRANULOCYTES: 0 %
LYMPHS: 28 %
Lymphocytes Absolute: 1.9 10*3/uL (ref 0.7–3.1)
MCH: 30.3 pg (ref 26.6–33.0)
MCHC: 33.1 g/dL (ref 31.5–35.7)
MCV: 92 fL (ref 79–97)
MONOCYTES: 7 %
Monocytes Absolute: 0.5 10*3/uL (ref 0.1–0.9)
NEUTROS PCT: 63 %
Neutrophils Absolute: 4.3 10*3/uL (ref 1.4–7.0)
Platelets: 251 10*3/uL (ref 150–379)
RBC: 4.12 x10E6/uL (ref 3.77–5.28)
RDW: 13.5 % (ref 12.3–15.4)
WBC: 6.8 10*3/uL (ref 3.4–10.8)

## 2016-02-09 LAB — COMPREHENSIVE METABOLIC PANEL
ALT: 50 IU/L — ABNORMAL HIGH (ref 0–32)
AST: 40 IU/L (ref 0–40)
Albumin/Globulin Ratio: 1.4 (ref 1.2–2.2)
Albumin: 4.5 g/dL (ref 3.6–4.8)
Alkaline Phosphatase: 176 IU/L — ABNORMAL HIGH (ref 39–117)
BUN/Creatinine Ratio: 15 (ref 12–28)
BUN: 12 mg/dL (ref 8–27)
Bilirubin Total: 0.4 mg/dL (ref 0.0–1.2)
CALCIUM: 9.9 mg/dL (ref 8.7–10.3)
CO2: 28 mmol/L (ref 18–29)
CREATININE: 0.8 mg/dL (ref 0.57–1.00)
Chloride: 100 mmol/L (ref 96–106)
GFR, EST AFRICAN AMERICAN: 89 mL/min/{1.73_m2} (ref >59)
GFR, EST NON AFRICAN AMERICAN: 77 mL/min/{1.73_m2} (ref >59)
GLOBULIN, TOTAL: 3.2 g/dL (ref 1.5–4.5)
Glucose: 97 mg/dL (ref 65–99)
Potassium: 4.5 mmol/L (ref 3.5–5.2)
SODIUM: 141 mmol/L (ref 134–144)
TOTAL PROTEIN: 7.7 g/dL (ref 6.0–8.5)

## 2016-02-09 LAB — HEMOGLOBIN A1C
ESTIMATED AVERAGE GLUCOSE: 105 mg/dL
HEMOGLOBIN A1C: 5.3 % (ref 4.8–5.6)

## 2016-02-09 LAB — LIPID PANEL
CHOL/HDL RATIO: 2.5 ratio (ref 0.0–4.4)
CHOLESTEROL TOTAL: 226 mg/dL — AB (ref 100–199)
HDL: 92 mg/dL (ref >39)
LDL Calculated: 116 mg/dL — ABNORMAL HIGH (ref 0–99)
TRIGLYCERIDES: 89 mg/dL (ref 0–149)
VLDL Cholesterol Cal: 18 mg/dL (ref 5–40)

## 2016-02-09 LAB — TSH: TSH: 2.99 u[IU]/mL (ref 0.450–4.500)

## 2016-02-09 LAB — VITAMIN D 25 HYDROXY (VIT D DEFICIENCY, FRACTURES): Vit D, 25-Hydroxy: 20 ng/mL — ABNORMAL LOW (ref 30.0–100.0)

## 2016-02-10 ENCOUNTER — Telehealth: Payer: Self-pay

## 2016-02-10 MED ORDER — VITAMIN D3 25 MCG (1000 UNIT) PO TABS: 1000.0000 [IU] | ORAL_TABLET | Freq: Every day | ORAL | Status: DC

## 2016-02-10 NOTE — Telephone Encounter (Signed)
Advised pt of lab results. Pt verbally acknowledges understanding. Raisa Ditto Drozdowski, CMA   

## 2016-02-10 NOTE — Addendum Note (Signed)
Addended by: Jerrell Belfast on: 02/10/2016 11:33 AM   Modules accepted: Medications

## 2016-02-10 NOTE — Telephone Encounter (Signed)
-----   Message from Margarita Rana, MD sent at 02/10/2016 11:39 AM EDT ----- Labs stable. Liver enzymes still mildly elevated. Think this has been worked up in the past. Vit D is low. Start  1000 iu daily. HgbA1c is normal. Not diabetic.  Cholesterol is mildly elevated, but has very high good cholesterol.   Eat healthy and exercise and recheck in 6 months. Thanks.

## 2016-02-11 ENCOUNTER — Ambulatory Visit: Payer: Medicare Other | Admitting: Radiation Oncology

## 2016-02-11 ENCOUNTER — Inpatient Hospital Stay: Admission: RE | Admit: 2016-02-11 | Payer: Medicare Other | Source: Ambulatory Visit | Admitting: Radiation Oncology

## 2016-02-15 ENCOUNTER — Telehealth: Payer: Self-pay | Admitting: General Surgery

## 2016-02-15 DIAGNOSIS — C50919 Malignant neoplasm of unspecified site of unspecified female breast: Secondary | ICD-10-CM

## 2016-02-15 MED ORDER — TAMOXIFEN CITRATE 20 MG PO TABS: 20.0000 mg | ORAL_TABLET | Freq: Every day | ORAL | Status: DC

## 2016-02-15 NOTE — Telephone Encounter (Signed)
Patient had been inadvertently given another prescription for Aromasin which she had tolerated so poorly. She had tolerated this for about a year primarily troubled with hot flashes before that rash and myalgias developed. She had a stopped the Femara after one year because of muscle aches and hot flashes.  The patient has no history of DVT and is status post hysterectomy.  We reviewed the use of tamoxifen for estrogen suppression. Her June 2016 bone density was normal, and will likely stay normal on tamoxifen.  The risk of DVT was discussed, and the importance of promptly reporting either superficial otitis (sees which she is familiar with from her father) sees or unexplained leg swelling was reviewed.  The patient is willing to make use of a trial tamoxifen 20 mg daily. She'll give a progress report by phone in 1 month. If the hot flashes become unbearable she'll call orally and will institute Effexor. (She had been offered Effexor once in the past but told that she would "need to be on it for life" which is certainly not the case.

## 2016-05-16 ENCOUNTER — Encounter: Payer: Self-pay | Admitting: General Surgery

## 2016-05-20 ENCOUNTER — Telehealth: Payer: Self-pay | Admitting: General Surgery

## 2016-05-20 NOTE — Telephone Encounter (Signed)
The shins case was reviewed in formerly with medical oncology. The likelihood that she would tolerate the last of the aromatase inhibitors is unlikely.  She's had adverse reactions to tamoxifen, Femara and eczema stain.  She did receive antiestrogen therapy for the better part of 2 years. At this point we'll not insist on any further antiestrogen trials. She'll continue annual follow-ups, do in June 2018.

## 2016-05-30 ENCOUNTER — Encounter: Payer: Self-pay | Admitting: Family Medicine

## 2016-05-31 ENCOUNTER — Other Ambulatory Visit: Payer: Self-pay

## 2016-05-31 MED ORDER — ALPRAZOLAM 0.5 MG PO TABS: 0.5000 mg | ORAL_TABLET | Freq: Two times a day (BID) | ORAL | 0 refills | Status: DC

## 2016-05-31 NOTE — Telephone Encounter (Signed)
rx called into [facility].

## 2016-05-31 NOTE — Telephone Encounter (Signed)
[facility]

## 2016-06-02 ENCOUNTER — Ambulatory Visit (INDEPENDENT_AMBULATORY_CARE_PROVIDER_SITE_OTHER): Payer: Medicare Other

## 2016-06-02 DIAGNOSIS — Z23 Encounter for immunization: Secondary | ICD-10-CM | POA: Diagnosis not present

## 2016-06-09 ENCOUNTER — Encounter: Payer: Self-pay | Admitting: Physician Assistant

## 2016-06-09 ENCOUNTER — Ambulatory Visit (INDEPENDENT_AMBULATORY_CARE_PROVIDER_SITE_OTHER): Payer: Medicare Other | Admitting: Physician Assistant

## 2016-06-09 VITALS — BP 146/100 | HR 104 | Temp 98.5°F | Resp 16 | Ht 64.0 in | Wt 244.0 lb

## 2016-06-09 DIAGNOSIS — E6609 Other obesity due to excess calories: Secondary | ICD-10-CM | POA: Diagnosis not present

## 2016-06-09 DIAGNOSIS — IMO0001 Reserved for inherently not codable concepts without codable children: Secondary | ICD-10-CM

## 2016-06-09 DIAGNOSIS — Z6841 Body Mass Index (BMI) 40.0 and over, adult: Secondary | ICD-10-CM

## 2016-06-09 DIAGNOSIS — Z713 Dietary counseling and surveillance: Secondary | ICD-10-CM

## 2016-06-09 NOTE — Patient Instructions (Signed)

## 2016-06-09 NOTE — Progress Notes (Signed)
Patient: Michelle Pitts Female    DOB: 1950-11-24   65 y.o.   MRN: [id number] Visit Date: 06/09/2016  Today's Provider: Mar Daring, PA-C   Chief Complaint  Patient presents with  . Obesity   Subjective:    HPI   Michelle Pitts is a 65 yr old female that is here today to talk about weight loss. Patient reports that she is not able to work out due to her weight, reports that she get short of breath. She has previously worked out at the [employer] therapy group earlier this year. She was doing well with this until her sister got sick and was in ICU for 31 days before passing. She was with her sister everyday helping take care of things. Since then she has not been exercising. She is also not eating correctly. She reports she only eats one meal per day and tries to eat healthier options, but not always. Her biggest issue that she mentions is that she likes to drink beer.   She also reports that she was smaller, but since her breast cancer diagnosis in 2015 and the hormone blocker medications following diagnosis she has put on 50 pounds.     Allergies  Allergen Reactions  . Codeine Nausea And Vomiting  . Hydrochlorothiazide Swelling    Swelling of tongue.     Current Outpatient Prescriptions:  .  ALPRAZolam (XANAX) 0.5 MG tablet, Take 1 tablet (0.5 mg total) by mouth 2 (two) times daily. As needed., Disp: 60 tablet, Rfl: 0 .  aspirin EC 81 MG tablet, Take 81 mg by mouth daily., Disp: , Rfl:  .  cholecalciferol (VITAMIN D) 1000 units tablet, Take 1 tablet (1,000 Units total) by mouth daily., Disp: 30 tablet, Rfl: 11 .  metoprolol succinate (TOPROL-XL) 25 MG 24 hr tablet, Take 1 tablet (25 mg total) by mouth daily., Disp: 90 tablet, Rfl: 1 .  varenicline (CHANTIX CONTINUING MONTH PAK) 1 MG tablet, Take 1 tablet (1 mg total) by mouth 2 (two) times daily., Disp: 60 tablet, Rfl: 5 .  vitamin E (VITAMIN E) 400 UNIT capsule, Take 400 Units by mouth daily., Disp: ,  Rfl:   Review of Systems  Constitutional: Negative.   Respiratory: Negative.   Cardiovascular: Negative.   Gastrointestinal: Negative.   Psychiatric/Behavioral: Negative.     Social History  Substance Use Topics  . Smoking status: Former Smoker    Packs/day: 1.00    Years: 50.00    Types: Cigarettes    Quit date: 09/15/2014  . Smokeless tobacco: Never Used  . Alcohol use 0.0 oz/week     Comment: 2/day   Objective:   BP (!) 146/100 (BP Location: Right Arm, Patient Position: Sitting, Cuff Size: Large)   Pulse (!) 104   Temp 98.5 F (36.9 C) (Oral)   Resp 16   Ht '5\' 4"'$  (1.626 m)   Wt 244 lb (110.7 kg)   BMI 41.88 kg/m   Physical Exam  Constitutional: She appears well-developed and well-nourished. No distress.  Neck: Normal range of motion. Neck supple.  Cardiovascular: Normal rate, regular rhythm and normal heart sounds.  Exam reveals no gallop and no friction rub.   No murmur heard. Pulmonary/Chest: Effort normal and breath sounds normal. No respiratory distress. She has no wheezes. She has no rales.  Musculoskeletal: She exhibits no edema.  Skin: She is not diaphoretic.  Psychiatric: She has a normal mood and affect. Her behavior is normal. Judgment  and thought content normal.  Vitals reviewed.     Assessment & Plan:     1. Encounter for weight loss counseling Discussed starting a regular exercise routine and working on eating healthier. We will try to get her back in with the group she had been in previously as she did enjoy that. Also discussed starting to eat small, frequent meals vs one large meal. Cut back to only one beer per day. We also discussed different medications to assist with weight loss, risks, benefits and adverse effects of each. She has used phentermine in the past and is interested in [place]. We will discuss in more detail in 4 weeks when she returns if she is making healthy lifestyle changes as discussed above.   2. Class 3 obesity due to excess  calories with serious comorbidity and body mass index (BMI) of 40.0 to 44.9 in adult [facility]) See above medical treatment plan.       Mar Daring, PA-C  [facility]

## 2016-06-14 ENCOUNTER — Telehealth: Payer: Self-pay | Admitting: *Deleted

## 2016-06-14 ENCOUNTER — Other Ambulatory Visit: Payer: Self-pay | Admitting: Hematology and Oncology

## 2016-07-11 ENCOUNTER — Encounter: Payer: Self-pay | Admitting: Physician Assistant

## 2016-07-11 ENCOUNTER — Ambulatory Visit (INDEPENDENT_AMBULATORY_CARE_PROVIDER_SITE_OTHER): Payer: Medicare Other | Admitting: Physician Assistant

## 2016-07-11 VITALS — BP 150/82 | HR 100 | Temp 98.5°F | Resp 16 | Ht 64.0 in | Wt 241.0 lb

## 2016-07-11 DIAGNOSIS — Z713 Dietary counseling and surveillance: Secondary | ICD-10-CM | POA: Diagnosis not present

## 2016-07-11 DIAGNOSIS — Z6841 Body Mass Index (BMI) 40.0 and over, adult: Secondary | ICD-10-CM | POA: Diagnosis not present

## 2016-07-11 DIAGNOSIS — E6609 Other obesity due to excess calories: Secondary | ICD-10-CM

## 2016-07-11 DIAGNOSIS — J40 Bronchitis, not specified as acute or chronic: Secondary | ICD-10-CM

## 2016-07-11 DIAGNOSIS — IMO0001 Reserved for inherently not codable concepts without codable children: Secondary | ICD-10-CM

## 2016-07-11 MED ORDER — PREDNISONE 10 MG (21) PO TBPK: ORAL_TABLET | ORAL | 0 refills | Status: DC

## 2016-07-11 MED ORDER — PHENTERMINE HCL 15 MG PO CAPS: 15.0000 mg | ORAL_CAPSULE | ORAL | 0 refills | Status: DC

## 2016-07-11 MED ORDER — LEVOFLOXACIN 500 MG PO TABS: 500.0000 mg | ORAL_TABLET | Freq: Every day | ORAL | 0 refills | Status: DC

## 2016-07-11 MED ORDER — ALBUTEROL SULFATE HFA 108 (90 BASE) MCG/ACT IN AERS: 2.0000 | INHALATION_SPRAY | Freq: Four times a day (QID) | RESPIRATORY_TRACT | 3 refills | Status: DC | PRN

## 2016-07-11 NOTE — Progress Notes (Signed)
Patient: Michelle Pitts Female    DOB: Feb 11, 1951   65 y.o.   MRN: [id number] Visit Date: 07/11/2016  Today's Provider: Mar Daring, PA-C   Chief Complaint  Patient presents with  . Follow-up    weight  . URI   Subjective:    HPI Upper Respiratory Infection: Patient complains of symptoms of a URI, bronchitis. Symptoms include congestion, cough, sore throat and swollen glands. Onset of symptoms was 5 days ago, gradually worsening since that time. She also c/o yellow sputum for the past 1 day .  She is drinking plenty of fluids. Evaluation to date: none. Treatment to date: decongestants. Patient reports she is taking her proair inhaler, advair and flonase.   Follow up for weight   The patient was last seen for this 4 weeks ago. Changes made at last visit include start diet and exercise. She has lost 4 pounds since starting.  ------------------------------------------------------------------------------------       Allergies  Allergen Reactions  . Codeine Nausea And Vomiting  . Hydrochlorothiazide Swelling    Swelling of tongue.     Current Outpatient Prescriptions:  .  ALPRAZolam (XANAX) 0.5 MG tablet, Take 1 tablet (0.5 mg total) by mouth 2 (two) times daily. As needed., Disp: 60 tablet, Rfl: 0 .  aspirin EC 81 MG tablet, Take 81 mg by mouth daily., Disp: , Rfl:  .  cholecalciferol (VITAMIN D) 1000 units tablet, Take 1 tablet (1,000 Units total) by mouth daily., Disp: 30 tablet, Rfl: 11 .  metoprolol succinate (TOPROL-XL) 25 MG 24 hr tablet, Take 1 tablet (25 mg total) by mouth daily., Disp: 90 tablet, Rfl: 1 .  varenicline (CHANTIX CONTINUING MONTH PAK) 1 MG tablet, Take 1 tablet (1 mg total) by mouth 2 (two) times daily., Disp: 60 tablet, Rfl: 5 .  vitamin E (VITAMIN E) 400 UNIT capsule, Take 400 Units by mouth daily., Disp: , Rfl:   Review of Systems  Constitutional: Positive for chills and fatigue. Negative for fever.  HENT: Positive for  congestion, postnasal drip, rhinorrhea, sinus pain, sinus pressure and sore throat. Negative for ear pain, sneezing, tinnitus and trouble swallowing.   Respiratory: Positive for cough. Negative for chest tightness, shortness of breath and wheezing.   Cardiovascular: Negative for chest pain, palpitations and leg swelling.  Gastrointestinal: Negative for abdominal pain and nausea.  Neurological: Negative for dizziness and headaches.  Psychiatric/Behavioral: Negative.     Social History  Substance Use Topics  . Smoking status: Former Smoker    Packs/day: 1.00    Years: 50.00    Types: Cigarettes    Quit date: 09/15/2014  . Smokeless tobacco: Never Used  . Alcohol use 0.0 oz/week     Comment: 2/day   Objective:   BP (!) 150/82 (BP Location: Right Arm, Patient Position: Sitting, Cuff Size: Large)   Pulse 100   Temp 98.5 F (36.9 C)   Resp 16   Ht '5\' 4"'$  (1.626 m)   Wt 241 lb (109.3 kg)   SpO2 97%   BMI 41.37 kg/m   Physical Exam  Constitutional: She appears well-developed and well-nourished. No distress.  HENT:  Head: Normocephalic and atraumatic.  Right Ear: Hearing, tympanic membrane, external ear and ear canal normal.  Left Ear: Hearing, tympanic membrane, external ear and ear canal normal.  Nose: Nose normal.  Mouth/Throat: Uvula is midline, oropharynx is clear and moist and mucous membranes are normal. No oropharyngeal exudate.  Eyes: Conjunctivae are normal. Pupils are  equal, round, and reactive to light. Right eye exhibits no discharge. Left eye exhibits no discharge. No scleral icterus.  Neck: Normal range of motion. Neck supple. No tracheal deviation present. No thyromegaly present.  Cardiovascular: Normal rate, regular rhythm and normal heart sounds.  Exam reveals no gallop and no friction rub.   No murmur heard. Pulmonary/Chest: Effort normal. No stridor. No respiratory distress. She has wheezes (faint throughout). She has no rales.  Lymphadenopathy:    She has no  cervical adenopathy.  Skin: Skin is warm and dry. She is not diaphoretic.  Psychiatric: She has a normal mood and affect. Her behavior is normal. Judgment and thought content normal.  Vitals reviewed.      Assessment & Plan:     1. Bronchitis Worsening symptoms that has failed over-the-counter treatments. I will give Levaquin and a prednisone taper as below. She is to stay well-hydrated and try to get plenty of rest. She may use Mucinex for congestion. She is to call the office if symptoms failed to improve or worsen. - levofloxacin (LEVAQUIN) 500 MG tablet; Take 1 tablet (500 mg total) by mouth daily.  Dispense: 7 tablet; Refill: 0 - predniSONE (STERAPRED UNI-PAK 21 TAB) 10 MG (21) TBPK tablet; Take as directed on package instructions  Dispense: 21 tablet; Refill: 0  2. Class 3 obesity due to excess calories with serious comorbidity and body mass index (BMI) of 40.0 to 44.9 in adult [facility]) She has been doing well with her weight loss even though she has this current infection. I will refill her phentermine as below and advised her to not take this medication until she finishes the Levaquin above. She voices understanding. I will see her back in 4-6 weeks for a weight recheck. - phentermine 15 MG capsule; Take 1 capsule (15 mg total) by mouth every morning.  Dispense: 30 capsule; Refill: 0  3. Encounter for weight loss counseling See above medical treatment plan. - phentermine 15 MG capsule; Take 1 capsule (15 mg total) by mouth every morning.  Dispense: 30 capsule; Refill: 0       Mar Daring, PA-C  [facility]

## 2016-07-11 NOTE — Patient Instructions (Signed)

## 2016-07-18 ENCOUNTER — Telehealth: Payer: Self-pay | Admitting: Physician Assistant

## 2016-07-18 DIAGNOSIS — J40 Bronchitis, not specified as acute or chronic: Secondary | ICD-10-CM

## 2016-07-18 MED ORDER — LEVOFLOXACIN 500 MG PO TABS: 500.0000 mg | ORAL_TABLET | Freq: Every day | ORAL | 0 refills | Status: DC

## 2016-07-18 MED ORDER — PREDNISONE 10 MG (21) PO TBPK: ORAL_TABLET | ORAL | 0 refills | Status: DC

## 2016-07-18 NOTE — Telephone Encounter (Signed)
Will get second round. If still no improvement after that she needs to be reevaluated.

## 2016-07-18 NOTE — Telephone Encounter (Signed)
LMTCB  Thanks,  -Michelle Pitts 

## 2016-07-18 NOTE — Telephone Encounter (Signed)
Please review

## 2016-07-18 NOTE — Telephone Encounter (Signed)
Pt called saying she has finished the antibiotic and prednisone and is still very symptomatic.  Please advise.  [phone]  Thanks Con Memos

## 2016-07-18 NOTE — Telephone Encounter (Signed)
Patient advised as directed below.  Thanks,  -Taresa [place]ville 

## 2016-07-19 ENCOUNTER — Telehealth: Payer: Self-pay | Admitting: Physician Assistant

## 2016-07-19 NOTE — Telephone Encounter (Signed)
LMTCB  Per Tawanna Sat is ok if patient takes Benadryl for tonight and to come see Adriana tomorrow since [facility] doesn't have appointments. She will take a pick on the patient when she is here.  Thanks,  -Kawthar Ennen

## 2016-07-19 NOTE — Telephone Encounter (Signed)
Pt stated that she picked up the medication that was sent in last night but hasn't started back taking either of the medications b/c last night she noticed swelling in both upper legs and today it has been a little difficult getting up and down. Pt would like a call back to see if there is anything she can do or take in the mean time of her appt. The medications she was speaking of are:  predniSONE (STERAPRED UNI-PAK 21 TAB) 10 MG (21) TBPK tablet  levofloxacin (LEVAQUIN) 500 MG tablet  Pt stated that the pharmacy advised her both medications could cause swelling. Please advise. Thanks TNP

## 2016-07-20 NOTE — Telephone Encounter (Signed)
Spoke with patient she reports that she went to buy Benadryl last night and took some. She reports that the swelling is much better. She also states the swelling was on top of her knee and towards the back that she was having difficulty bending her knee, but now is better. She is asking if she needs to go ahead and start the medication, per Tawanna Sat if patient is still having the URI symptoms that patient should keep the appointment for tomorrow to be evaluated. Patient agreed.  Thanks,  -Caroly Purewal

## 2016-07-21 ENCOUNTER — Ambulatory Visit
Admission: RE | Admit: 2016-07-21 | Discharge: 2016-07-21 | Disposition: A | Discharge status: Home / Self Care | Payer: Medicare Other | Source: Ambulatory Visit | Attending: Physician Assistant | Admitting: Physician Assistant

## 2016-07-21 ENCOUNTER — Encounter: Payer: Self-pay | Admitting: Physician Assistant

## 2016-07-21 ENCOUNTER — Ambulatory Visit (INDEPENDENT_AMBULATORY_CARE_PROVIDER_SITE_OTHER): Payer: Medicare Other | Admitting: Physician Assistant

## 2016-07-21 ENCOUNTER — Telehealth: Payer: Self-pay

## 2016-07-21 VITALS — BP 140/70 | HR 100 | Temp 98.7°F | Resp 18 | Wt 243.4 lb

## 2016-07-21 DIAGNOSIS — R1031 Right lower quadrant pain: Secondary | ICD-10-CM | POA: Diagnosis not present

## 2016-07-21 DIAGNOSIS — J440 Chronic obstructive pulmonary disease with acute lower respiratory infection: Secondary | ICD-10-CM | POA: Diagnosis not present

## 2016-07-21 DIAGNOSIS — R198 Other specified symptoms and signs involving the digestive system and abdomen: Secondary | ICD-10-CM | POA: Insufficient documentation

## 2016-07-21 DIAGNOSIS — R1900 Intra-abdominal and pelvic swelling, mass and lump, unspecified site: Secondary | ICD-10-CM | POA: Diagnosis not present

## 2016-07-21 HISTORY — DX: Essential (primary) hypertension: I10

## 2016-07-21 LAB — POCT I-STAT CREATININE: Creatinine, Ser: 0.9 mg/dL (ref 0.44–1.00)

## 2016-07-21 MED ORDER — IOPAMIDOL (ISOVUE-300) INJECTION 61%
100.0000 mL | Freq: Once | INTRAVENOUS | Status: AC | PRN
  Administered 2016-07-21: 100 mL via INTRAVENOUS

## 2016-07-21 MED ORDER — CEFUROXIME AXETIL 250 MG PO TABS: 250.0000 mg | ORAL_TABLET | Freq: Two times a day (BID) | ORAL | 0 refills | Status: DC

## 2016-07-21 MED ORDER — LEVALBUTEROL HCL 1.25 MG/0.5ML IN NEBU: 1.2500 mg | INHALATION_SOLUTION | Freq: Once | RESPIRATORY_TRACT | Status: DC

## 2016-07-21 NOTE — Telephone Encounter (Signed)
Pt was advised as below. Pt verbally acknowledges understanding and agrees with the treatment plan. Renaldo Fiddler, CMA

## 2016-07-21 NOTE — Progress Notes (Signed)
Patient: Michelle Pitts Female    DOB: 1950-09-20   65 y.o.   MRN: [id number] Visit Date: 07/21/2016  Today's Provider: Mar Daring, PA-C   Chief Complaint  Patient presents with  . URI   Subjective:    HPI Patient is here today to be re-evaluated for her URI symptoms. She was seen on 07/11/16 and was treated for bronchitis with Prednisone and Levaquin. Another round of prednisone and levaquin was sent to the pharmacy, but patient didn't start medications due to edema. Spoke with patient on 12/05 and she stated she took Benadryl and the swelling around her knees was better but the URI symptoms are not any better.  Patient complains of symptoms of a URI. Symptoms include congestion and cough. Onset of symptoms was a few weeks ago, unchanged since that time. She also c/o congestion, nasal congestion, post nasal drip and sinus pressure for the few days.  She is drinking plenty of fluids.She is using her inhaler and is using saline spray. Evaluation to date: none seen previously and treated for bronchitis. Treatment to date: antibiotics and prednisone.  She also reports that when she woke up this morning she had got this sharp pain in the lower abdomen right lower quadrant that it comes every 2 minutes. It is a sharp, stabbing pain when it onsets. No bowel changes. Patient has had cholecystectomy and hysterectomy. Her ovaries do remain and her appendix remains.  Allergies  Allergen Reactions  . Codeine Nausea And Vomiting  . Hydrochlorothiazide Swelling    Swelling of tongue.     Current Outpatient Prescriptions:  .  albuterol (PROAIR HFA) 108 (90 Base) MCG/ACT inhaler, Inhale 2 puffs into the lungs every 6 (six) hours as needed for wheezing or shortness of breath., Disp: 18 g, Rfl: 3 .  ALPRAZolam (XANAX) 0.5 MG tablet, Take 1 tablet (0.5 mg total) by mouth 2 (two) times daily. As needed., Disp: 60 tablet, Rfl: 0 .  aspirin EC 81 MG tablet, Take 81 mg by mouth daily.,  Disp: , Rfl:  .  cholecalciferol (VITAMIN D) 1000 units tablet, Take 1 tablet (1,000 Units total) by mouth daily., Disp: 30 tablet, Rfl: 11 .  fluticasone (FLONASE) 50 MCG/ACT nasal spray, Place 2 sprays into both nostrils daily., Disp: , Rfl:  .  Fluticasone-Salmeterol (ADVAIR) 100-50 MCG/DOSE AEPB, Inhale 1 puff into the lungs 2 (two) times daily., Disp: , Rfl:  .  metoprolol succinate (TOPROL-XL) 25 MG 24 hr tablet, Take 1 tablet (25 mg total) by mouth daily., Disp: 90 tablet, Rfl: 1 .  phentermine 15 MG capsule, Take 1 capsule (15 mg total) by mouth every morning., Disp: 30 capsule, Rfl: 0 .  varenicline (CHANTIX CONTINUING MONTH PAK) 1 MG tablet, Take 1 tablet (1 mg total) by mouth 2 (two) times daily., Disp: 60 tablet, Rfl: 5 .  vitamin E (VITAMIN E) 400 UNIT capsule, Take 400 Units by mouth daily., Disp: , Rfl:  .  levofloxacin (LEVAQUIN) 500 MG tablet, Take 1 tablet (500 mg total) by mouth daily. (Patient not taking: Reported on 07/21/2016), Disp: 7 tablet, Rfl: 0 .  predniSONE (STERAPRED UNI-PAK 21 TAB) 10 MG (21) TBPK tablet, Take as directed on package instructions (Patient not taking: Reported on 07/21/2016), Disp: 21 tablet, Rfl: 0  Review of Systems  Constitutional: Negative for fever.  HENT: Positive for congestion, postnasal drip, rhinorrhea, sinus pain, sinus pressure and sore throat. Negative for ear pain and trouble swallowing.   Respiratory:  Positive for cough, chest tightness, shortness of breath and wheezing.   Cardiovascular: Negative for chest pain, palpitations and leg swelling.  Gastrointestinal: Positive for abdominal pain. Negative for abdominal distention, anal bleeding, blood in stool, constipation, diarrhea, nausea, rectal pain and vomiting.  Genitourinary: Negative for dysuria, enuresis, flank pain, frequency, hematuria, pelvic pain, urgency, vaginal bleeding, vaginal discharge and vaginal pain.  Musculoskeletal: Negative for back pain.  Neurological: Negative for  dizziness and headaches.    Social History  Substance Use Topics  . Smoking status: Former Smoker    Packs/day: 1.00    Years: 50.00    Types: Cigarettes    Quit date: 09/15/2014  . Smokeless tobacco: Never Used  . Alcohol use 0.0 oz/week     Comment: 2/day   Objective:   BP 140/70 (BP Location: Right Arm, Patient Position: Sitting, Cuff Size: Large)   Pulse 100   Temp 98.7 F (37.1 C) (Oral)   Resp 18   Wt 243 lb 6.4 oz (110.4 kg)   SpO2 98%   BMI 41.78 kg/m   Physical Exam  Constitutional: She appears well-developed and well-nourished. No distress.  HENT:  Head: Normocephalic and atraumatic.  Right Ear: Hearing, tympanic membrane, external ear and ear canal normal.  Left Ear: Hearing, tympanic membrane, external ear and ear canal normal.  Nose: Nose normal.  Mouth/Throat: Uvula is midline, oropharynx is clear and moist and mucous membranes are normal. No oropharyngeal exudate.  Eyes: Conjunctivae are normal. Pupils are equal, round, and reactive to light. Right eye exhibits no discharge. Left eye exhibits no discharge. No scleral icterus.  Neck: Normal range of motion. Neck supple. No tracheal deviation present. No thyromegaly present.  Cardiovascular: Normal rate, regular rhythm and normal heart sounds.  Exam reveals no gallop and no friction rub.   No murmur heard. Pulmonary/Chest: Effort normal. No stridor. No respiratory distress. She has no decreased breath sounds. She has wheezes (throughout). She has rhonchi (lower lung fields). She has no rales.  Abdominal: Soft. Bowel sounds are normal. She exhibits no distension. There is no hepatosplenomegaly. There is tenderness in the right lower quadrant. There is rebound, guarding and tenderness at McBurney's point. There is no CVA tenderness. No hernia.  Positive obturator and psoas testing.   Lymphadenopathy:    She has no cervical adenopathy.  Skin: Skin is warm and dry. She is not diaphoretic.  Vitals reviewed.       Assessment & Plan:     1. Chronic obstructive pulmonary disease with acute lower respiratory infection ([place]) Xopenex nebulizer treatment was given to the patient today in the office. Wheezes did improve following treatment. We'll give Ceftin as below and prednisone for exacerbation. She is to continue her normal inhalers. She is to call the office if symptoms fail to improve or worsen. - levalbuterol (XOPENEX) nebulizer solution 1.25 mg; Take 1.25 mg by nebulization once. - cefUROXime (CEFTIN) 250 MG tablet; Take 1 tablet (250 mg total) by mouth 2 (two) times daily with a meal.  Dispense: 14 tablet; Refill: 0  2. Colicky RLQ abdominal pain Colicky right lower quadrant pain with guarding and rebound tenderness. Possible appendicitis. Will get CT with contrast as below to rule out appendicitis or ovarian involvement. I will follow-up with the patient pending results. - CT Abdomen Pelvis W Contrast  3. Right lower quadrant guarding See above medical treatment plan. - CT Abdomen Pelvis W Contrast       Mar Daring, PA-C  [facility]

## 2016-07-21 NOTE — Patient Instructions (Addendum)
Chronic Obstructive Pulmonary Disease Exacerbation Chronic obstructive pulmonary disease (COPD) is a common lung problem. In COPD, the flow of air from the lungs is limited. COPD exacerbations are times that breathing gets worse and you need extra treatment. Without treatment they can be life threatening. If they happen often, your lungs can become more damaged. If your COPD gets worse, your doctor may treat you with:  Medicines.  Oxygen.  Different ways to clear your airway, such as using a mask. Follow these instructions at home:  Do not smoke.  Avoid tobacco smoke and other things that bother your lungs.  If given, take your antibiotic medicine as told. Finish the medicine even if you start to feel better.  Only take medicines as told by your doctor.  Drink enough fluids to keep your pee (urine) clear or pale yellow (unless your doctor has told you not to).  Use a cool mist machine (vaporizer).  If you use oxygen or a machine that turns liquid medicine into a mist (nebulizer), continue to use them as told.  Keep up with shots (vaccinations) as told by your doctor.  Exercise regularly.  Eat healthy foods.  Keep all doctor visits as told. Get help right away if:  You are very short of breath and it gets worse.  You have trouble talking.  You have bad chest pain.  You have blood in your spit (sputum).  You have a fever.  You keep throwing up (vomiting).  You feel weak, or you pass out (faint).  You feel confused.  You keep getting worse. This information is not intended to replace advice given to you by your health care provider. Make sure you discuss any questions you have with your health care provider. Document Released: 07/21/2011 Document Revised: 01/07/2016 Document Reviewed: 04/05/2013 Elsevier Interactive Patient Education  2017 [employer].  Cefuroxime tablets What is this medicine? CEFUROXIME (se fyoor OX eem) is a cephalosporin antibiotic. It is  used to treat certain kinds of bacterial infections. It will not work for colds, flu, or other viral infections. This medicine may be used for other purposes; ask your health care provider or pharmacist if you have questions. COMMON BRAND NAME(S): Alti-Cefuroxime, Ceftin What should I tell my health care provider before I take this medicine? They need to know if you have any of these conditions: -bleeding problems -bowel disease, like colitis -kidney disease -liver disease -an unusual or allergic reaction to cefuroxime, other antibiotics or medicines, foods, dyes or preservatives -pregnant or trying to get pregnant -breast-feeding How should I use this medicine? Take this medicine by mouth with a full glass of water. Follow the directions on the prescription label. Do not crush or chew. This medicine works best if you take it with food. Take your medicine at regular intervals. Do not take your medicine more often than directed. Take all of your medicine as directed even if you think your are better. Do not skip doses or stop your medicine early. Talk to your pediatrician regarding the use of this medicine in children. Special care may be needed. While this drug may be prescribed for children as young as 49 months of age for selected conditions, precautions do apply. Overdosage: If you think you have taken too much of this medicine contact a poison control center or emergency room at once. NOTE: This medicine is only for you. Do not share this medicine with others. What if I miss a dose? If you miss a dose, take it as soon  as you can. If it is almost time for your next dose, take only that dose. Do not take double or extra doses. What may interact with this medicine? This medicine may interact with the following medications: -antacids -birth control pills -certain medicines for infection like amikacin, gentamicin, tobramycin -diuretics -probenecid -warfarin This list may not describe all  possible interactions. Give your health care provider a list of all the medicines, herbs, non-prescription drugs, or dietary supplements you use. Also tell them if you smoke, drink alcohol, or use illegal drugs. Some items may interact with your medicine. What should I watch for while using this medicine? Tell your doctor or health care professional if your symptoms do not improve or if you get new symptoms. Do not treat diarrhea with over the counter products. Contact your doctor if you have diarrhea that lasts more than 2 days or if it is severe and watery. This medicine can interfere with some urine glucose tests. If you use such tests, talk with your health care professional. If you are being treated for a sexually transmitted disease, avoid sexual contact until you have finished your treatment. Your sexual partner may also need treatment. What side effects may I notice from receiving this medicine? Side effects that you should report to your doctor or health care professional as soon as possible: -allergic reactions like skin rash, itching or hives, swelling of the face, lips, or tongue -dark urine -difficulty breathing -fever -irregular heartbeat or chest pain -redness, blistering, peeling or loosening of the skin, including inside the mouth -seizures -unusual bleeding or bruising -unusually weak or tired -white patches or sores in the mouth Side effects that usually do not require medical attention (report to your doctor or health care professional if they continue or are bothersome): -diarrhea -gas or heartburn -headache -nausea, vomiting -vaginal itching This list may not describe all possible side effects. Call your doctor for medical advice about side effects. You may report side effects to FDA at 1-800-FDA-1088. Where should I keep my medicine? Keep out of the reach of children. Store at room temperature between 15 and 30 degrees C (59 and 86 degrees F). Keep container tightly  closed. Protect from moisture. Throw away any unused medicine after the expiration date. NOTE: This sheet is a summary. It may not cover all possible information. If you have questions about this medicine, talk to your doctor, pharmacist, or health care provider.  2017 Elsevier/Gold Standard (2013-06-17 09:43:18)

## 2016-07-21 NOTE — Telephone Encounter (Signed)
-----   Message from Mar Daring, [place] sent at 07/21/2016  3:10 PM EST ----- CT scan was normal for RLQ but there is a cyst in the left adnexal area (Left lower quadrant) that needs to be imaged with [place] to further clarify what it is and see if it is a cyst. I will order this for you and they will be in contact. Good news essentially is no appendicitis or other abnormality in that area. I am not sure what is causing pain but could be strain from coughing so much. Start antibiotic and prednisone. Call if no improvement or if breathing worsens again.

## 2016-07-26 ENCOUNTER — Encounter: Payer: Self-pay | Admitting: Physician Assistant

## 2016-07-26 DIAGNOSIS — N949 Unspecified condition associated with female genital organs and menstrual cycle: Secondary | ICD-10-CM

## 2016-07-29 ENCOUNTER — Ambulatory Visit
Admission: RE | Admit: 2016-07-29 | Discharge: 2016-07-29 | Disposition: A | Discharge status: Home / Self Care | Payer: Medicare Other | Source: Ambulatory Visit | Attending: Physician Assistant | Admitting: Physician Assistant

## 2016-07-29 ENCOUNTER — Telehealth: Payer: Self-pay

## 2016-07-29 DIAGNOSIS — N83292 Other ovarian cyst, left side: Secondary | ICD-10-CM | POA: Insufficient documentation

## 2016-07-29 DIAGNOSIS — N949 Unspecified condition associated with female genital organs and menstrual cycle: Secondary | ICD-10-CM | POA: Diagnosis present

## 2016-07-29 DIAGNOSIS — Z90710 Acquired absence of both cervix and uterus: Secondary | ICD-10-CM | POA: Insufficient documentation

## 2016-07-29 NOTE — Telephone Encounter (Signed)
[id number] 

## 2016-07-29 NOTE — Telephone Encounter (Signed)
-----   Message from Mar Daring, [place] sent at 07/29/2016  3:05 PM EST ----- US shows a simple cyst in the left ovary. Recommended to f/u with repeat US in 6-12 months.

## 2016-07-29 NOTE — Telephone Encounter (Signed)
Advised patient of results.  

## 2016-07-31 ENCOUNTER — Ambulatory Visit
Admission: EM | Admit: 2016-07-31 | Discharge: 2016-07-31 | Disposition: A | Discharge status: Home / Self Care | Payer: Medicare Other | Attending: Family Medicine | Admitting: Family Medicine

## 2016-07-31 DIAGNOSIS — A084 Viral intestinal infection, unspecified: Secondary | ICD-10-CM | POA: Diagnosis not present

## 2016-07-31 MED ORDER — HYOSCYAMINE SULFATE SL 0.125 MG SL SUBL: 0.1250 mg | SUBLINGUAL_TABLET | Freq: Three times a day (TID) | SUBLINGUAL | 0 refills | Status: DC | PRN

## 2016-07-31 MED ORDER — ONDANSETRON 8 MG PO TBDP
8.0000 mg | ORAL_TABLET | Freq: Once | ORAL | Status: AC
  Administered 2016-07-31: 8 mg via ORAL

## 2016-07-31 MED ORDER — ONDANSETRON HCL 4 MG PO TABS: 4.0000 mg | ORAL_TABLET | Freq: Three times a day (TID) | ORAL | 0 refills | Status: DC | PRN

## 2016-07-31 NOTE — Discharge Instructions (Signed)
Recommend take Zofran '4mg'$  every 8 hours as needed for nausea and vomiting. May try Levsin 1 tablet every 8 hours as needed for stomach pain. Liquid diet today- advance as tolerated. Follow-up in 2 days with your primary care provider if not improving.

## 2016-07-31 NOTE — ED Triage Notes (Signed)
Pt c/o chills on Friday, and then emesis and diarrhea, no appetite. Last emesis yesterday, diarrhea about an hour ago. Still drinking fluids.

## 2016-07-31 NOTE — ED Provider Notes (Signed)
CSN: [id number]     Arrival date & time 07/31/16  1454 History   First MD Initiated Contact with Patient 07/31/16 1534     Chief Complaint  Patient presents with  . Nausea   (Consider location/radiation/quality/duration/timing/severity/associated sxs/prior Treatment) 65 year old female presents with nausea, vomiting and diarrhea that started 2 days ago. Also complaining of stomach cramps but no fever or chills now. Just finished Ceftin for sinusitis and bronchitis 5 days ago. Has been drinking Gatorade with some success. Last episode of vomiting yesterday. Still slightly nauseous. Last episode of diarrhea about 1 hour ago. No other family members ill. No unusual foods or travel.    The history is provided by the patient.    Past Medical History:  Diagnosis Date  . Anxiety   . Breast cancer ([place]) 01/23/2014   Left breast, T1c, N0, M0. ER/PR +; Her 2 neu not overexpressed. Oncotype DX: 13,Low risk.9% over 10 years.   . Cancer ([place]) 1995   vulvar, [facility]  . COPD (chronic obstructive pulmonary disease) (HCC)    bronchitis  . Hypertension   . 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 Personal history of tobacco use, presenting hazards to health 11/18/2015  . Thyroid disease    Past Surgical History:  Procedure Laterality Date  . BLADDER SUSPENSION  2001  . BREAST BIOPSY Right 1975, 1985   benign cyst [name]) and fibroadenoma([facility])  . BREAST BIOPSY Right 01/03/2014   Papilloma, no atypia detected on mammography.  Marland Kitchen BREAST BIOPSY Left 01/03/2014   Invasive mammary carcinoma.  Marland Kitchen BREAST BIOPSY Left 01/13/2015   ultrasound giuded biopsy, negative  . BREAST CYST ASPIRATION Left 1985  . BREAST LUMPECTOMY Left 01/23/2014   positive  . BREAST SURGERY Left 01/23/14   wide excision   . BREAST SURGERY  04/10/14   Debridement of fat necrosis  . CHOLECYSTECTOMY    . COLONOSCOPY  2014   Dr Tiffany Kocher  . ROTATOR CUFF REPAIR Right 1995  . VAGINAL HYSTERECTOMY  1978   partial  . wide excision  debriedment Left 04/10/14   Family History  Problem Relation Age of Onset  . Diabetes Father   . COPD Mother   . Heart failure Sister   . Emphysema Sister   . Diabetes Sister   . Hypertension Sister   . Emphysema Brother   . Lung cancer Brother   . Crohn's disease Sister   . Hypertension Sister   . Hypertension Sister   . Emphysema Brother   . Cancer Other     great great paternal aunt, ? age  . Cancer Maternal Grandfather 62    ? source  . Cancer Paternal Grandfather 30    ? source  . Breast cancer Neg Hx    Social History  Substance Use Topics  . Smoking status: Former Smoker    Packs/day: 1.00    Years: 50.00    Types: Cigarettes    Quit date: 09/15/2014  . Smokeless tobacco: Never Used  . Alcohol use 0.0 oz/week     Comment: 2/day   OB History    Gravida Para Term Preterm AB Living   '3 2     1     '$ SAB TAB Ectopic Multiple Live Births   1              Obstetric Comments   1st Menstrual Cycle:  12  1st Pregnancy:  16     Review of Systems  Constitutional: Positive for appetite change,  chills and fatigue. Negative for fever.  HENT: Negative for congestion.   Respiratory: Negative for cough, chest tightness and shortness of breath.   Cardiovascular: Negative for chest pain.  Gastrointestinal: Positive for abdominal pain, diarrhea, nausea and vomiting. Negative for blood in stool and constipation.  Genitourinary: Negative for difficulty urinating, dysuria, flank pain, vaginal discharge and vaginal pain.  Musculoskeletal: Negative for back pain, neck pain and neck stiffness.  Skin: Negative for rash.  Neurological: Positive for headaches. Negative for dizziness, weakness and light-headedness.  Hematological: Negative for adenopathy.    Allergies  Codeine; Hydrochlorothiazide; and Levaquin [levofloxacin in d5w]  Home Medications   Prior to Admission medications   Medication Sig Start Date End Date Taking? Authorizing Provider  albuterol (PROAIR HFA) 108 (90  Base) MCG/ACT inhaler Inhale 2 puffs into the lungs every 6 (six) hours as needed for wheezing or shortness of breath. 07/11/16  Yes Clearnce Sorrel Burnette, PA-C  ALPRAZolam Duanne Moron) 0.5 MG tablet Take 1 tablet (0.5 mg total) by mouth 2 (two) times daily. As needed. 05/31/16  Yes Mar Daring, PA-C  aspirin EC 81 MG tablet Take 81 mg by mouth daily.   Yes Historical Provider, MD  cholecalciferol (VITAMIN D) 1000 units tablet Take 1 tablet (1,000 Units total) by mouth daily. 02/10/16  Yes Margarita Rana, MD  fluticasone [facility]) 50 MCG/ACT nasal spray Place 2 sprays into both nostrils daily.   Yes Historical Provider, MD  Fluticasone-Salmeterol (ADVAIR) 100-50 MCG/DOSE AEPB Inhale 1 puff into the lungs 2 (two) times daily.   Yes Historical Provider, MD  metoprolol succinate (TOPROL-XL) 25 MG 24 hr tablet Take 1 tablet (25 mg total) by mouth daily. 02/08/16  Yes Margarita Rana, MD  phentermine 15 MG capsule Take 1 capsule (15 mg total) by mouth every morning. 07/11/16  Yes Mar Daring, PA-C  varenicline (CHANTIX CONTINUING MONTH PAK) 1 MG tablet Take 1 tablet (1 mg total) by mouth 2 (two) times daily. 02/08/16  Yes Margarita Rana, MD  vitamin E (VITAMIN E) 400 UNIT capsule Take 400 Units by mouth daily.   Yes Historical Provider, MD  Hyoscyamine Sulfate SL (LEVSIN/SL) 0.125 MG SUBL Place 0.125 mg under the tongue every 8 (eight) hours as needed (for stomach cramps). 07/31/16   Katy Apo, NP  ondansetron (ZOFRAN) 4 MG tablet Take 1 tablet (4 mg total) by mouth every 8 (eight) hours as needed for nausea or vomiting. 07/31/16   Katy Apo, NP   Meds Ordered and Administered this Visit   Medications  ondansetron (ZOFRAN-ODT) disintegrating tablet 8 mg (8 mg Oral Given 07/31/16 1524)    BP (!) 141/83 (BP Location: Left Arm)   Pulse (!) 104   Temp 98.9 F (37.2 C) (Oral)   Resp 20   Ht '5\' 4"'$  (1.626 m)   Wt 243 lb (110.2 kg)   SpO2 99%   BMI 41.71 kg/m  No data  found.   Physical Exam  Constitutional: She is oriented to person, place, and time. She appears well-developed and well-nourished. No distress.  HENT:  Head: Normocephalic and atraumatic.  Right Ear: Hearing, tympanic membrane, external ear and ear canal normal.  Left Ear: Hearing, tympanic membrane, external ear and ear canal normal.  Nose: Nose normal.  Mouth/Throat: Uvula is midline, oropharynx is clear and moist and mucous membranes are normal.  Neck: Normal range of motion. Neck supple.  Cardiovascular: Normal rate, regular rhythm and normal heart sounds.   Pulmonary/Chest: Effort normal and breath sounds normal.  No respiratory distress.  Abdominal: Soft. Normal appearance and bowel sounds are normal. There is no hepatosplenomegaly. There is generalized tenderness and tenderness in the epigastric area. There is no rigidity, no rebound, no guarding and no CVA tenderness.  Lymphadenopathy:    She has no cervical adenopathy.  Neurological: She is alert and oriented to person, place, and time. She has normal strength.  Skin: Skin is warm and dry. No rash noted.  Psychiatric: She has a normal mood and affect. Her behavior is normal. Judgment and thought content normal.    Urgent Care Course   Clinical Course     Procedures (including critical care time)  Labs Review Labs Reviewed - No data to display  Imaging Review No results found.   Visual Acuity Review  Right Eye Distance:   Left Eye Distance:   Bilateral Distance:    Right Eye Near:   Left Eye Near:    Bilateral Near:         MDM   1. Viral gastroenteritis    Discussed with patient that she probably has a viral illness. Recommend Zofran '4mg'$  every 8 hours as needed for nausea. May also take Levsin every 8 hours as needed for stomach cramps and bloating. Continue Gatorade or similar fluids and advance diet as tolerated. Follow-up with her primary care provider in 2 days if not improving.      Katy Apo, NP 08/01/16 1039

## 2016-08-17 ENCOUNTER — Ambulatory Visit: Payer: Medicare Other | Admitting: Physician Assistant

## 2016-09-12 ENCOUNTER — Other Ambulatory Visit: Payer: Self-pay | Admitting: Family Medicine

## 2016-09-12 DIAGNOSIS — I10 Essential (primary) hypertension: Secondary | ICD-10-CM

## 2016-09-13 NOTE — Telephone Encounter (Signed)
Please review-aa 

## 2016-09-14 ENCOUNTER — Ambulatory Visit: Payer: Medicare Other | Admitting: Physician Assistant

## 2016-10-07 ENCOUNTER — Encounter: Payer: Self-pay | Admitting: *Deleted

## 2016-10-10 ENCOUNTER — Ambulatory Visit
Admission: RE | Admit: 2016-10-10 | Discharge: 2016-10-10 | Disposition: A | Discharge status: Home / Self Care | Payer: Medicare Other | Source: Ambulatory Visit | Attending: Unknown Physician Specialty | Admitting: Unknown Physician Specialty

## 2016-10-10 ENCOUNTER — Encounter: Payer: Self-pay | Admitting: *Deleted

## 2016-10-10 ENCOUNTER — Ambulatory Visit: Payer: Medicare Other | Admitting: Anesthesiology

## 2016-10-10 ENCOUNTER — Encounter
Admission: RE | Disposition: A | Discharge status: Home / Self Care | Payer: Self-pay | Source: Ambulatory Visit | Attending: Unknown Physician Specialty

## 2016-10-10 DIAGNOSIS — E039 Hypothyroidism, unspecified: Secondary | ICD-10-CM | POA: Diagnosis not present

## 2016-10-10 DIAGNOSIS — G4730 Sleep apnea, unspecified: Secondary | ICD-10-CM | POA: Insufficient documentation

## 2016-10-10 DIAGNOSIS — E559 Vitamin D deficiency, unspecified: Secondary | ICD-10-CM | POA: Diagnosis not present

## 2016-10-10 DIAGNOSIS — G4700 Insomnia, unspecified: Secondary | ICD-10-CM | POA: Insufficient documentation

## 2016-10-10 DIAGNOSIS — E7800 Pure hypercholesterolemia, unspecified: Secondary | ICD-10-CM | POA: Insufficient documentation

## 2016-10-10 DIAGNOSIS — Z7982 Long term (current) use of aspirin: Secondary | ICD-10-CM | POA: Diagnosis not present

## 2016-10-10 DIAGNOSIS — Z6841 Body Mass Index (BMI) 40.0 and over, adult: Secondary | ICD-10-CM | POA: Insufficient documentation

## 2016-10-10 DIAGNOSIS — I10 Essential (primary) hypertension: Secondary | ICD-10-CM | POA: Diagnosis not present

## 2016-10-10 DIAGNOSIS — Z87891 Personal history of nicotine dependence: Secondary | ICD-10-CM | POA: Insufficient documentation

## 2016-10-10 DIAGNOSIS — Z86010 Personal history of colonic polyps: Secondary | ICD-10-CM | POA: Diagnosis not present

## 2016-10-10 DIAGNOSIS — Z79899 Other long term (current) drug therapy: Secondary | ICD-10-CM | POA: Diagnosis not present

## 2016-10-10 DIAGNOSIS — Z7951 Long term (current) use of inhaled steroids: Secondary | ICD-10-CM | POA: Diagnosis not present

## 2016-10-10 DIAGNOSIS — D124 Benign neoplasm of descending colon: Secondary | ICD-10-CM | POA: Insufficient documentation

## 2016-10-10 DIAGNOSIS — Z8371 Family history of colonic polyps: Secondary | ICD-10-CM | POA: Diagnosis not present

## 2016-10-10 DIAGNOSIS — Z1211 Encounter for screening for malignant neoplasm of colon: Secondary | ICD-10-CM | POA: Insufficient documentation

## 2016-10-10 DIAGNOSIS — Z853 Personal history of malignant neoplasm of breast: Secondary | ICD-10-CM | POA: Insufficient documentation

## 2016-10-10 DIAGNOSIS — K648 Other hemorrhoids: Secondary | ICD-10-CM | POA: Insufficient documentation

## 2016-10-10 DIAGNOSIS — K219 Gastro-esophageal reflux disease without esophagitis: Secondary | ICD-10-CM | POA: Diagnosis not present

## 2016-10-10 DIAGNOSIS — F419 Anxiety disorder, unspecified: Secondary | ICD-10-CM | POA: Diagnosis not present

## 2016-10-10 DIAGNOSIS — J449 Chronic obstructive pulmonary disease, unspecified: Secondary | ICD-10-CM | POA: Diagnosis not present

## 2016-10-10 HISTORY — DX: Sleep apnea, unspecified: G47.30

## 2016-10-10 HISTORY — DX: Shortness of breath: R06.02

## 2016-10-10 HISTORY — DX: Personal history of colon polyps, unspecified: Z86.0100

## 2016-10-10 HISTORY — PX: COLONOSCOPY WITH PROPOFOL: SHX5780

## 2016-10-10 HISTORY — DX: Insomnia, unspecified: G47.00

## 2016-10-10 HISTORY — DX: Hypothyroidism, unspecified: E03.9

## 2016-10-10 HISTORY — DX: Vitamin D deficiency, unspecified: E55.9

## 2016-10-10 HISTORY — DX: Gastro-esophageal reflux disease without esophagitis: K21.9

## 2016-10-10 HISTORY — DX: Cardiac arrhythmia, unspecified: I49.9

## 2016-10-10 HISTORY — DX: Pure hypercholesterolemia, unspecified: E78.00

## 2016-10-10 HISTORY — DX: Personal history of colonic polyps: Z86.010

## 2016-10-10 SURGERY — COLONOSCOPY WITH PROPOFOL
Anesthesia: General

## 2016-10-10 MED ORDER — LIDOCAINE HCL (PF) 1 % IJ SOLN
2.0000 mL | Freq: Once | INTRAMUSCULAR | Status: AC
  Administered 2016-10-10: 0.3 mL via INTRADERMAL

## 2016-10-10 MED ORDER — FENTANYL CITRATE (PF) 100 MCG/2ML IJ SOLN
INTRAMUSCULAR | Status: AC
  Filled 2016-10-10: qty 2

## 2016-10-10 MED ORDER — LIDOCAINE HCL (PF) 1 % IJ SOLN
INTRAMUSCULAR | Status: AC
  Administered 2016-10-10: 0.3 mL via INTRADERMAL
  Filled 2016-10-10: qty 2

## 2016-10-10 MED ORDER — PROPOFOL 500 MG/50ML IV EMUL
INTRAVENOUS | Status: DC | PRN
  Administered 2016-10-10: 120 ug/kg/min via INTRAVENOUS

## 2016-10-10 MED ORDER — MIDAZOLAM HCL 2 MG/2ML IJ SOLN
INTRAMUSCULAR | Status: DC | PRN
  Administered 2016-10-10 (×2): 1 mg via INTRAVENOUS

## 2016-10-10 MED ORDER — PROPOFOL 500 MG/50ML IV EMUL
INTRAVENOUS | Status: AC
  Filled 2016-10-10: qty 50

## 2016-10-10 MED ORDER — MIDAZOLAM HCL 2 MG/2ML IJ SOLN
INTRAMUSCULAR | Status: AC
  Filled 2016-10-10: qty 2

## 2016-10-10 MED ORDER — PROPOFOL 10 MG/ML IV BOLUS
INTRAVENOUS | Status: DC | PRN
  Administered 2016-10-10: 80 mg via INTRAVENOUS

## 2016-10-10 MED ORDER — SODIUM CHLORIDE 0.9 % IV SOLN: INTRAVENOUS | Status: DC

## 2016-10-10 MED ORDER — SODIUM CHLORIDE 0.9 % IV SOLN
INTRAVENOUS | Status: DC
  Administered 2016-10-10: 1000 mL via INTRAVENOUS

## 2016-10-10 NOTE — Anesthesia Post-op Follow-up Note (Cosign Needed)
Anesthesia QCDR form completed.        

## 2016-10-10 NOTE — H&P (Signed)
Primary Care Physician:  Mar Daring, PA-C Primary Gastroenterologist:  Dr. Vira Agar  Pre-Procedure History & Physical: HPI:  Michelle Pitts is a 66 y.o. female is here for an colonoscopy.   Past Medical History:  Diagnosis Date  . Anxiety   . Breast cancer ([place]) 01/23/2014   Left breast, T1c, N0, M0. ER/PR +; Her 2 neu not overexpressed. Oncotype DX: 13,Low risk.9% over 10 years.   . Cancer ([place]) 1995   vulvar, [facility]  . COPD (chronic obstructive pulmonary disease) (HCC)    bronchitis  . Dysrhythmia   . GERD (gastroesophageal reflux disease)   . History of colon polyps   . Hypercholesterolemia   . Hypertension   . Hypothyroidism   . Insomnia   . 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 Personal history of tobacco use, presenting hazards to health 11/18/2015  . Sleep apnea   . SOB (shortness of breath) on exertion   . Thyroid disease   . Vitamin D deficiency     Past Surgical History:  Procedure Laterality Date  . BLADDER SUSPENSION  2001  . BREAST BIOPSY Right 1975, 1985   benign cyst [name]) and fibroadenoma([facility])  . BREAST BIOPSY Right 01/03/2014   Papilloma, no atypia detected on mammography.  Marland Kitchen BREAST BIOPSY Left 01/03/2014   Invasive mammary carcinoma.  Marland Kitchen BREAST BIOPSY Left 01/13/2015   ultrasound giuded biopsy, negative  . BREAST CYST ASPIRATION Left 1985  . BREAST LUMPECTOMY Left 01/23/2014   positive  . BREAST SURGERY Left 01/23/14   wide excision   . BREAST SURGERY  04/10/14   Debridement of fat necrosis  . CHOLECYSTECTOMY    . COLONOSCOPY  2014   Dr Tiffany Kocher  . ROTATOR CUFF REPAIR Right 1995  . VAGINAL HYSTERECTOMY  1978   partial  . wide excision debriedment Left 04/10/14    Prior to Admission medications   Medication Sig Start Date End Date Taking? Authorizing Provider  albuterol (PROAIR HFA) 108 (90 Base) MCG/ACT inhaler Inhale 2 puffs into the lungs every 6 (six) hours as needed for wheezing or shortness of breath. 07/11/16  Yes  Clearnce Sorrel Burnette, PA-C  ALPRAZolam Duanne Moron) 0.5 MG tablet Take 1 tablet (0.5 mg total) by mouth 2 (two) times daily. As needed. 05/31/16  Yes Mar Daring, PA-C  aspirin EC 81 MG tablet Take 81 mg by mouth daily.   Yes Historical Provider, MD  cholecalciferol (VITAMIN D) 1000 units tablet Take 1 tablet (1,000 Units total) by mouth daily. 02/10/16  Yes Margarita Rana, MD  fluticasone [facility]) 50 MCG/ACT nasal spray Place 2 sprays into both nostrils daily.   Yes Historical Provider, MD  Fluticasone-Salmeterol (ADVAIR) 100-50 MCG/DOSE AEPB Inhale 1 puff into the lungs 2 (two) times daily.   Yes Historical Provider, MD  metoprolol succinate (TOPROL-XL) 25 MG 24 hr tablet take 1 tablet by mouth once daily 09/13/16  Yes Mar Daring, PA-C  varenicline (CHANTIX CONTINUING MONTH PAK) 1 MG tablet Take 1 tablet (1 mg total) by mouth 2 (two) times daily. 02/08/16  Yes Margarita Rana, MD  vitamin E (VITAMIN E) 400 UNIT capsule Take 400 Units by mouth daily.   Yes Historical Provider, MD  Hyoscyamine Sulfate SL (LEVSIN/SL) 0.125 MG SUBL Place 0.125 mg under the tongue every 8 (eight) hours as needed (for stomach cramps). Patient not taking: Reported on 10/10/2016 07/31/16   Katy Apo, NP  ondansetron (ZOFRAN) 4 MG tablet Take 1 tablet (4 mg total) by mouth every  8 (eight) hours as needed for nausea or vomiting. Patient not taking: Reported on 10/10/2016 07/31/16   Katy Apo, NP  phentermine 15 MG capsule Take 1 capsule (15 mg total) by mouth every morning. Patient not taking: Reported on 10/10/2016 07/11/16   Mar Daring, PA-C    Allergies as of 09/09/2016 - Review Complete 07/31/2016  Allergen Reaction Noted  . Codeine Nausea And Vomiting 01/02/2014  . Hydrochlorothiazide Swelling 05/15/2015  . Levaquin [levofloxacin in d5w] Hives and Swelling 01/02/2014    Family History  Problem Relation Age of Onset  . Diabetes Father   . COPD Mother   . Heart failure Sister   .  Emphysema Sister   . Diabetes Sister   . Hypertension Sister   . Emphysema Brother   . Lung cancer Brother   . Crohn's disease Sister   . Hypertension Sister   . Hypertension Sister   . Emphysema Brother   . Cancer Other     great great paternal aunt, ? age  . Cancer Maternal Grandfather 92    ? source  . Cancer Paternal Grandfather 1    ? source  . Breast cancer Neg Hx     Social History   Social History  . Marital status: Divorced    Spouse name: N/A  . Number of children: N/A  . Years of education: N/A   Occupational History  . Not on file.   Social History Main Topics  . Smoking status: Former Smoker    Packs/day: 1.00    Years: 50.00    Types: Cigarettes    Quit date: 09/15/2014  . Smokeless tobacco: Never Used  . Alcohol use 1.2 oz/week    2 Cans of beer per week     Comment: 2/day  . Drug use: No  . Sexual activity: Not on file   Other Topics Concern  . Not on file   Social History Narrative  . No narrative on file    Review of Systems: See HPI, otherwise negative ROS  Physical Exam: BP 137/66   Pulse 89   Temp 98.5 F (36.9 C) (Tympanic)   Resp 18   Ht '5\' 4"'$  (1.626 m)   Wt 108.9 kg (240 lb)   SpO2 98%   BMI 41.20 kg/m  General:   Alert,  pleasant and cooperative in NAD Head:  Normocephalic and atraumatic. Neck:  Supple; no masses or thyromegaly. Lungs:  Clear throughout to auscultation.    Heart:  Regular rate and rhythm. Abdomen:  Soft, nontender and nondistended. Normal bowel sounds, without guarding, and without rebound.   Neurologic:  Alert and  oriented x4;  grossly normal neurologically.  Impression/Plan: Michelle Pitts is here for an colonoscopy to be performed for [facility] colon polyps and [facility] colon polyps.  Risks, benefits, limitations, and alternatives regarding  colonoscopy have been reviewed with the patient.  Questions have been answered.  All parties agreeable.   Gaylyn Cheers, MD  10/10/2016, 2:46 PM

## 2016-10-10 NOTE — Anesthesia Postprocedure Evaluation (Signed)
Anesthesia Post Note  Patient: Michelle Pitts  Procedure(s) Performed: Procedure(s) (LRB): COLONOSCOPY WITH PROPOFOL (N/A)  Patient location during evaluation: Endoscopy Anesthesia Type: General Level of consciousness: awake and alert and oriented Pain management: pain level controlled Vital Signs Assessment: post-procedure vital signs reviewed and stable Respiratory status: spontaneous breathing, nonlabored ventilation and respiratory function stable Cardiovascular status: blood pressure returned to baseline and stable Postop Assessment: no signs of nausea or vomiting Anesthetic complications: no     Last Vitals:  Vitals:   10/10/16 1421 10/10/16 1535  BP: 137/66 103/83  Pulse:  81  Resp:  20  Temp:  36.3 C    Last Pain:  Vitals:   10/10/16 1535  TempSrc: Tympanic                 Deauna Yaw

## 2016-10-10 NOTE — Transfer of Care (Signed)
Immediate Anesthesia Transfer of Care Note  Patient: Michelle Pitts  Procedure(s) Performed: Procedure(s): COLONOSCOPY WITH PROPOFOL (N/A)  Patient Location: PACU and Endoscopy Unit  Anesthesia Type:General  Level of Consciousness: awake, alert  and oriented  Airway & Oxygen Therapy: Patient Spontanous Breathing and Patient connected to nasal cannula oxygen  Post-op Assessment: Report given to RN and Post -op Vital signs reviewed and stable  Post vital signs: Reviewed and stable  Last Vitals:  Vitals:   10/10/16 1421 10/10/16 1535  BP: 137/66 103/83  Pulse:  81  Resp:  20  Temp:      Last Pain:  Vitals:   10/10/16 1419  TempSrc: Tympanic         Complications: No apparent anesthesia complications

## 2016-10-10 NOTE — Op Note (Signed)
[facility] Gastroenterology Patient Name: Michelle Pitts Procedure Date: 10/10/2016 3:07 PM MRN: [id number] Account #: [id number] Date of Birth: 1951/05/13 Admit Type: Outpatient Age: 66 Room: [facility] ENDO ROOM 1 Gender: Female Note Status: Finalized Procedure:            Colonoscopy Indications:          High risk colon cancer surveillance: Personal history                        of colonic polyps Providers:            Manya Silvas, MD Referring MD:         Mar Daring (Referring MD) Medicines:            Propofol per Anesthesia Complications:        No immediate complications. Procedure:            Pre-Anesthesia Assessment:                       - After reviewing the risks and benefits, the patient                        was deemed in satisfactory condition to undergo the                        procedure.                       After obtaining informed consent, the colonoscope was                        passed under direct vision. Throughout the procedure,                        the patient's blood pressure, pulse, and oxygen                        saturations were monitored continuously. The                        Colonoscope was introduced through the anus and                        advanced to the the cecum, identified by appendiceal                        orifice and ileocecal valve. The colonoscopy was                        performed without difficulty. The patient tolerated the                        procedure well. The quality of the bowel preparation                        was adequate to identify polyps. Findings:      A diminutive polyp was found in the descending colon. The polyp was       sessile. The polyp was removed with a jumbo cold forceps. Resection and       retrieval were complete.      Internal hemorrhoids  were found during endoscopy. The hemorrhoids were       small and Grade I (internal hemorrhoids that do not prolapse).      The exam was otherwise without abnormality. Impression:           - One diminutive polyp in the descending colon, removed                        with a jumbo cold forceps. Resected and retrieved.                       - Internal hemorrhoids.                       - The examination was otherwise normal. Recommendation:       - Await pathology results. Manya Silvas, MD 10/10/2016 3:29:22 PM This report has been signed electronically. Number of Addenda: 0 Note Initiated On: 10/10/2016 3:07 PM Scope Withdrawal Time: 0 hours 8 minutes 48 seconds  Total Procedure Duration: 0 hours 13 minutes 20 seconds       [facility]

## 2016-10-10 NOTE — Anesthesia Preprocedure Evaluation (Addendum)
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:   Pulmonary shortness of breath and with exertion, sleep apnea , COPD,  COPD inhaler, neg recent URI, former smoker,           Cardiovascular Exercise Tolerance: Good hypertension, (-) angina(-) CAD, (-) Past MI, (-) Cardiac Stents and (-) CABG + dysrhythmias (-) Valvular Problems/Murmurs     Neuro/Psych PSYCHIATRIC DISORDERS (Anxiety) negative neurological ROS     GI/Hepatic Neg liver ROS, GERD  ,  Endo/Other  neg diabetesMorbid obesity  Renal/GU negative Renal ROS  negative genitourinary   Musculoskeletal   Abdominal   Peds  Hematology negative hematology ROS (+)   Anesthesia Other Findings Past Medical History: No date: Anxiety 01/23/2014: Breast cancer ([place])     Comment: Left breast, T1c, N0, M0. ER/PR +; Her 2 neu               not overexpressed. Oncotype DX: 13,Low risk.9%               over 10 years.  1995: Cancer ([place])     Comment: vulvar, [facility] No date: COPD (chronic obstructive pulmonary disease) (*     Comment: bronchitis No date: Dysrhythmia No date: GERD (gastroesophageal reflux disease) No date: History of colon polyps No date: Hypercholesterolemia No date: Hypertension No date: Hypothyroidism No date: Insomnia 01/09/2014: Papilloma of breast 11/18/2015: Personal history of tobacco use, presenting ha* No date: Sleep apnea No date: SOB (shortness of breath) on exertion No date: Thyroid disease No date: Vitamin D deficiency   Reproductive/Obstetrics negative OB ROS                             Anesthesia  Physical Anesthesia Plan  ASA: III  Anesthesia Plan: General   Post-op Pain Management:    Induction:   Airway Management Planned:   Additional Equipment:   Intra-op Plan:   Post-operative Plan:   Informed Consent: I have reviewed the patients History and Physical, chart, labs and discussed the procedure including the risks, benefits and alternatives for the proposed anesthesia with the patient or authorized representative who has indicated his/her understanding and acceptance.   Dental Advisory Given  Plan Discussed with: Anesthesiologist, CRNA and Surgeon  Anesthesia Plan Comments:         Anesthesia Quick Evaluation

## 2016-10-11 ENCOUNTER — Encounter: Payer: Self-pay | Admitting: Unknown Physician Specialty

## 2016-10-12 LAB — SURGICAL PATHOLOGY

## 2016-10-25 ENCOUNTER — Encounter: Payer: Self-pay | Admitting: Physician Assistant

## 2016-11-21 ENCOUNTER — Telehealth: Payer: Self-pay | Admitting: *Deleted

## 2016-11-21 DIAGNOSIS — Z87891 Personal history of nicotine dependence: Secondary | ICD-10-CM

## 2016-11-21 NOTE — Telephone Encounter (Signed)
Notified patient that annual lung cancer screening low dose CT scan is due. Confirmed that patient is within the age range of 55-77, and asymptomatic, (no signs or symptoms of lung cancer). Patient denies illness that would prevent curative treatment for lung cancer if found. The patient is a former smoker, quit 2/16, with a 30 pack year history. The shared decision making visit was done 07/23/14. Patient is agreeable for CT scan being scheduled.

## 2016-11-24 ENCOUNTER — Other Ambulatory Visit: Payer: Self-pay

## 2016-11-24 DIAGNOSIS — C50312 Malignant neoplasm of lower-inner quadrant of left female breast: Secondary | ICD-10-CM

## 2016-12-02 ENCOUNTER — Ambulatory Visit
Admission: RE | Admit: 2016-12-02 | Discharge: 2016-12-02 | Disposition: A | Discharge status: Home / Self Care | Payer: Medicare Other | Source: Ambulatory Visit | Attending: Oncology | Admitting: Oncology

## 2016-12-02 DIAGNOSIS — I2510 Atherosclerotic heart disease of native coronary artery without angina pectoris: Secondary | ICD-10-CM | POA: Insufficient documentation

## 2016-12-02 DIAGNOSIS — Z87891 Personal history of nicotine dependence: Secondary | ICD-10-CM | POA: Diagnosis not present

## 2016-12-02 DIAGNOSIS — Z122 Encounter for screening for malignant neoplasm of respiratory organs: Secondary | ICD-10-CM | POA: Insufficient documentation

## 2016-12-02 DIAGNOSIS — J439 Emphysema, unspecified: Secondary | ICD-10-CM | POA: Insufficient documentation

## 2016-12-02 DIAGNOSIS — I700 Atherosclerosis of aorta: Secondary | ICD-10-CM | POA: Diagnosis not present

## 2016-12-02 DIAGNOSIS — K760 Fatty (change of) liver, not elsewhere classified: Secondary | ICD-10-CM | POA: Insufficient documentation

## 2016-12-05 ENCOUNTER — Encounter: Payer: Self-pay | Admitting: *Deleted

## 2017-02-02 ENCOUNTER — Ambulatory Visit (INDEPENDENT_AMBULATORY_CARE_PROVIDER_SITE_OTHER): Payer: Medicare Other

## 2017-02-02 VITALS — BP 132/80 | HR 92 | Temp 98.7°F | Ht 65.0 in | Wt 242.5 lb

## 2017-02-02 DIAGNOSIS — Z0000 Encounter for general adult medical examination without abnormal findings: Secondary | ICD-10-CM

## 2017-02-02 NOTE — Patient Instructions (Signed)
[name] , Thank you for taking time to come for your Medicare Wellness Visit. I appreciate your ongoing commitment to your health goals. Please review the following plan we discussed and let me know if I can assist you in the future.   Screening recommendations/referrals: Colonoscopy: completed 10/10/16, due 02/22028 Mammogram: completed 01/28/16, due 01/2017 Bone Density: completed 05/13/15 Recommended yearly ophthalmology/optometry visit for glaucoma screening and checkup Recommended yearly dental visit for hygiene and checkup  Vaccinations: Influenza vaccine: due 04/2017 Pneumococcal vaccine: completed series Tdap vaccine: declined Shingles vaccine: completed 09/17/07  Advanced directives: Advance directive discussed with you today. Even though you declined this today please call our office should you change your mind and we can give you the proper paperwork for you to fill out.  Conditions/risks identified: Obesity; Recommend eating 3 small meals a day with 2 healthy snacks in between.   Next appointment: 02/09/17 @ 2:00 PM   Preventive Care 65 Years and Older, Female Preventive care refers to lifestyle choices and visits with your health care provider that can promote health and wellness. What does preventive care include?  A yearly physical exam. This is also called an annual well check.  Dental exams once or twice a year.  Routine eye exams. Ask your health care provider how often you should have your eyes checked.  Personal lifestyle choices, including:  Daily care of your teeth and gums.  Regular physical activity.  Eating a healthy diet.  Avoiding tobacco and drug use.  Limiting alcohol use.  Practicing safe sex.  Taking low-dose aspirin every day.  Taking vitamin and mineral supplements as recommended by your health care provider. What happens during an annual well check? The services and screenings done by your health care provider during your  annual well check will depend on your age, overall health, lifestyle risk factors, and family history of disease. Counseling  Your health care provider may ask you questions about your:  Alcohol use.  Tobacco use.  Drug use.  Emotional well-being.  Home and relationship well-being.  Sexual activity.  Eating habits.  History of falls.  Memory and ability to understand (cognition).  Work and work Statistician.  Reproductive health. Screening  You may have the following tests or measurements:  Height, weight, and BMI.  Blood pressure.  Lipid and cholesterol levels. These may be checked every 5 years, or more frequently if you are over 44 years old.  Skin check.  Lung cancer screening. You may have this screening every year starting at age 48 if you have a 30-pack-year history of smoking and currently smoke or have quit within the past 15 years.  Fecal occult blood test (FOBT) of the stool. You may have this test every year starting at age 73.  Flexible sigmoidoscopy or colonoscopy. You may have a sigmoidoscopy every 5 years or a colonoscopy every 10 years starting at age 35.  Hepatitis C blood test.  Hepatitis B blood test.  Sexually transmitted disease (STD) testing.  Diabetes screening. This is done by checking your blood sugar (glucose) after you have not eaten for a while (fasting). You may have this done every 1-3 years.  Bone density scan. This is done to screen for osteoporosis. You may have this done starting at age 59.  Mammogram. This may be done every 1-2 years. Talk to your health care provider about how often you should have regular mammograms. Talk with your health care provider about your test results, treatment options, and if necessary, the need  for more tests. Vaccines  Your health care provider may recommend certain vaccines, such as:  Influenza vaccine. This is recommended every year.  Tetanus, diphtheria, and acellular pertussis (Tdap, Td)  vaccine. You may need a Td booster every 10 years.  Zoster vaccine. You may need this after age 62.  Pneumococcal 13-valent conjugate (PCV13) vaccine. One dose is recommended after age 32.  Pneumococcal polysaccharide (PPSV23) vaccine. One dose is recommended after age 67. Talk to your health care provider about which screenings and vaccines you need and how often you need them. This information is not intended to replace advice given to you by your health care provider. Make sure you discuss any questions you have with your health care provider. Document Released: 08/28/2015 Document Revised: 04/20/2016 Document Reviewed: 06/02/2015 Elsevier Interactive Patient Education  2017 [place] Prevention in the Home Falls can cause injuries. They can happen to people of all ages. There are many things you can do to make your home safe and to help prevent falls. What can I do on the outside of my home?  Regularly fix the edges of walkways and driveways and fix any cracks.  Remove anything that might make you trip as you walk through a door, such as a raised step or threshold.  Trim any bushes or trees on the path to your home.  Use bright outdoor lighting.  Clear any walking paths of anything that might make someone trip, such as rocks or tools.  Regularly check to see if handrails are loose or broken. Make sure that both sides of any steps have handrails.  Any raised decks and porches should have guardrails on the edges.  Have any leaves, snow, or ice cleared regularly.  Use sand or salt on walking paths during winter.  Clean up any spills in your garage right away. This includes oil or grease spills. What can I do in the bathroom?  Use night lights.  Install grab bars by the toilet and in the tub and shower. Do not use towel bars as grab bars.  Use non-skid mats or decals in the tub or shower.  If you need to sit down in the shower, use a plastic, non-slip  stool.  Keep the floor dry. Clean up any water that spills on the floor as soon as it happens.  Remove soap buildup in the tub or shower regularly.  Attach bath mats securely with double-sided non-slip rug tape.  Do not have throw rugs and other things on the floor that can make you trip. What can I do in the bedroom?  Use night lights.  Make sure that you have a light by your bed that is easy to reach.  Do not use any sheets or blankets that are too big for your bed. They should not hang down onto the floor.  Have a firm chair that has side arms. You can use this for support while you get dressed.  Do not have throw rugs and other things on the floor that can make you trip. What can I do in the kitchen?  Clean up any spills right away.  Avoid walking on wet floors.  Keep items that you use a lot in easy-to-reach places.  If you need to reach something above you, use a strong step stool that has a grab bar.  Keep electrical cords out of the way.  Do not use floor polish or wax that makes floors slippery. If you must use wax, use  non-skid floor wax.  Do not have throw rugs and other things on the floor that can make you trip. What can I do with my stairs?  Do not leave any items on the stairs.  Make sure that there are handrails on both sides of the stairs and use them. Fix handrails that are broken or loose. Make sure that handrails are as long as the stairways.  Check any carpeting to make sure that it is firmly attached to the stairs. Fix any carpet that is loose or worn.  Avoid having throw rugs at the top or bottom of the stairs. If you do have throw rugs, attach them to the floor with carpet tape.  Make sure that you have a light switch at the top of the stairs and the bottom of the stairs. If you do not have them, ask someone to add them for you. What else can I do to help prevent falls?  Wear shoes that:  Do not have high heels.  Have rubber bottoms.  Are  comfortable and fit you well.  Are closed at the toe. Do not wear sandals.  If you use a stepladder:  Make sure that it is fully opened. Do not climb a closed stepladder.  Make sure that both sides of the stepladder are locked into place.  Ask someone to hold it for you, if possible.  Clearly mark and make sure that you can see:  Any grab bars or handrails.  First and last steps.  Where the edge of each step is.  Use tools that help you move around (mobility aids) if they are needed. These include:  Canes.  Walkers.  Scooters.  Crutches.  Turn on the lights when you go into a dark area. Replace any light bulbs as soon as they burn out.  Set up your furniture so you have a clear path. Avoid moving your furniture around.  If any of your floors are uneven, fix them.  If there are any pets around you, be aware of where they are.  Review your medicines with your doctor. Some medicines can make you feel dizzy. This can increase your chance of falling. Ask your doctor what other things that you can do to help prevent falls. This information is not intended to replace advice given to you by your health care provider. Make sure you discuss any questions you have with your health care provider. Document Released: 05/28/2009 Document Revised: 01/07/2016 Document Reviewed: 09/05/2014 Elsevier Interactive Patient Education  2017 [employer].

## 2017-02-02 NOTE — Progress Notes (Signed)
Subjective:   Michelle Pitts is a 66 y.o. female who presents for Medicare Annual (Subsequent) preventive examination.  Review of Systems:  N/A  Cardiac Risk Factors include: advanced age (>45men, >65 women);hypertension;dyslipidemia;obesity (BMI >30kg/m2);sedentary lifestyle     Objective:     Vitals: BP 132/80 (BP Location: Right Arm)   Pulse 92   Temp 98.7 F (37.1 C) (Oral)   Ht 5\' 5"  (1.651 m)   Wt 242 lb 8 oz (110 kg)   BMI 40.35 kg/m   Body mass index is 40.35 kg/m.   Tobacco History  Smoking Status  . Former Smoker  . Packs/day: 1.00  . Years: 50.00  . Types: Cigarettes  . Quit date: 09/15/2014  Smokeless Tobacco  . Never Used     Counseling given: Not Answered   Past Medical History:  Diagnosis Date  . Anxiety   . Breast cancer ([place]) 01/23/2014   Left breast, T1c, N0, M0. ER/PR +; Her 2 neu not overexpressed. Oncotype DX: 13,Low risk.9% over 10 years.   . Cancer ([place]) 1995   vulvar, [facility]  . COPD (chronic obstructive pulmonary disease) ([facility])    bronchitis  . Dysrhythmia   . GERD (gastroesophageal reflux disease)   . History of colon polyps   . Hypercholesterolemia   . Hypertension   . Hypothyroidism   . Insomnia   . 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 Personal history of tobacco use, presenting hazards to health 11/18/2015  . Sleep apnea   . SOB (shortness of breath) on exertion   . Thyroid disease   . Vitamin D deficiency    Past Surgical History:  Procedure Laterality Date  . BLADDER SUSPENSION  2001  . BREAST BIOPSY Right 1975, 1985   benign cyst [name]) and fibroadenoma([facility])  . BREAST BIOPSY Right 01/03/2014   Papilloma, no atypia detected on mammography.  Marland Kitchen BREAST BIOPSY Left 01/03/2014   Invasive mammary carcinoma.  Marland Kitchen BREAST BIOPSY Left 01/13/2015   ultrasound giuded biopsy, negative  . BREAST CYST ASPIRATION Left 1985  . BREAST LUMPECTOMY Left 01/23/2014   positive  . BREAST SURGERY Left 01/23/14   wide excision     . BREAST SURGERY  04/10/14   Debridement of fat necrosis  . CHOLECYSTECTOMY    . COLONOSCOPY  2014   Dr Tiffany Kocher  . COLONOSCOPY WITH PROPOFOL N/A 10/10/2016   Procedure: COLONOSCOPY WITH PROPOFOL;  Surgeon: Manya Silvas, MD;  Location: [facility] ENDOSCOPY;  Service: Endoscopy;  Laterality: N/A;  . ROTATOR CUFF REPAIR Right 1995  . VAGINAL HYSTERECTOMY  1978   partial  . wide excision debriedment Left 04/10/14   Family History  Problem Relation Age of Onset  . Diabetes Father   . COPD Mother   . Heart failure Sister   . Emphysema Sister   . Diabetes Sister   . Hypertension Sister   . Emphysema Brother   . Lung cancer Brother   . Crohn's disease Sister   . Hypertension Sister   . Hypertension Sister   . Emphysema Brother   . Cancer Other        great great paternal aunt, ? age  . Cancer Maternal Grandfather 34       ? source  . Cancer Paternal Grandfather 49       ? source  . Breast cancer Neg Hx    History  Sexual Activity  . Sexual activity: Not on file    Outpatient Encounter Prescriptions as of 02/02/2017  Medication Sig  . albuterol (PROAIR HFA) 108 (90 Base) MCG/ACT inhaler Inhale 2 puffs into the lungs every 6 (six) hours as needed for wheezing or shortness of breath.  . ALPRAZolam (XANAX) 0.5 MG tablet Take 1 tablet (0.5 mg total) by mouth 2 (two) times daily. As needed.  Marland Kitchen aspirin EC 81 MG tablet Take 81 mg by mouth daily.  . cholecalciferol (VITAMIN D) 1000 units tablet Take 1 tablet (1,000 Units total) by mouth daily.  . metoprolol succinate (TOPROL-XL) 25 MG 24 hr tablet take 1 tablet by mouth once daily  . varenicline (CHANTIX CONTINUING MONTH PAK) 1 MG tablet Take 1 tablet (1 mg total) by mouth 2 (two) times daily.  . [DISCONTINUED] fluticasone (FLONASE) 50 MCG/ACT nasal spray Place 2 sprays into both nostrils daily.  . [DISCONTINUED] Fluticasone-Salmeterol (ADVAIR) 100-50 MCG/DOSE AEPB Inhale 1 puff into the lungs 2 (two) times daily.  . [DISCONTINUED]  Hyoscyamine Sulfate SL (LEVSIN/SL) 0.125 MG SUBL Place 0.125 mg under the tongue every 8 (eight) hours as needed (for stomach cramps). (Patient not taking: Reported on 10/10/2016)  . [DISCONTINUED] ondansetron (ZOFRAN) 4 MG tablet Take 1 tablet (4 mg total) by mouth every 8 (eight) hours as needed for nausea or vomiting. (Patient not taking: Reported on 10/10/2016)  . [DISCONTINUED] phentermine 15 MG capsule Take 1 capsule (15 mg total) by mouth every morning. (Patient not taking: Reported on 10/10/2016)  . [DISCONTINUED] vitamin E (VITAMIN E) 400 UNIT capsule Take 400 Units by mouth daily.   Facility-Administered Encounter Medications as of 02/02/2017  Medication  . levalbuterol (XOPENEX) nebulizer solution 1.25 mg    Activities of Daily Living In your present state of health, do you have any difficulty performing the following activities: 02/02/2017 02/08/2016  Hearing? Y N  Vision? N N  Difficulty concentrating or making decisions? N N  Walking or climbing stairs? N N  Dressing or bathing? N N  Doing errands, shopping? N N  Preparing Food and eating ? N -  Using the Toilet? N -  In the past six months, have you accidently leaked urine? N -  Do you have problems with loss of bowel control? N -  Managing your Medications? N -  Managing your Finances? N -  Housekeeping or managing your Housekeeping? N -  Some recent data might be hidden    Patient Care Team: Rubye Beach as PCP - General (Family Medicine) Bary Castilla [place] Gleason, MD (General Surgery)    Assessment:     Exercise Activities and Dietary recommendations Current Exercise Habits: The patient does not participate in regular exercise at present, Exercise limited by: None identified  Goals    . Have 3 meals a day          Recommend eating 3 small meals a day with 2 healthy snacks in between.       Fall Risk Fall Risk  02/02/2017 02/08/2016  Falls in the past year? No No   Depression Screen PHQ 2/9 Scores  02/02/2017 02/08/2016 02/11/2015  PHQ - 2 Score 0 4 0  PHQ- 9 Score - 10 -     Cognitive Function     6CIT Screen 02/02/2017  What Year? 0 points  What month? 0 points  What time? 0 points  Count back from 20 0 points  Months in reverse 0 points  Repeat phrase 2 points  Total Score 2    Immunization History  Administered Date(s) Administered  . Influenza, High Dose Seasonal PF 06/25/2015,  06/02/2016  . Pneumococcal Conjugate-13 05/16/2014  . Pneumococcal Polysaccharide-23 06/16/2007, 02/08/2016  . Zoster 09/17/2007   Screening Tests Health Maintenance  Topic Date Due  . Hepatitis C Screening  02/08/2017 (Originally [birth year])  . TETANUS/TDAP  08/15/2026 (Originally 05/14/1970)  . INFLUENZA VACCINE  03/15/2017  . MAMMOGRAM  01/27/2018  . COLONOSCOPY  10/10/2026  . DEXA SCAN  Completed  . PNA vac Low Risk Adult  Completed      Plan:  I have personally reviewed and addressed the Medicare Annual Wellness questionnaire and have noted the following in the patient's chart:  A. Medical and social history B. Use of alcohol, tobacco or illicit drugs  C. Current medications and supplements D. Functional ability and status E.  Nutritional status F.  Physical activity G. Advance directives H. List of other physicians I.  Hospitalizations, surgeries, and ER visits in previous 12 months J.  [place] such as hearing and vision if needed, cognitive and depression L. Referrals and appointments - none  In addition, I have reviewed and discussed with patient certain preventive protocols, quality metrics, and best practice recommendations. A written personalized care plan for preventive services as well as general preventive health recommendations were provided to patient.  See attached scanned questionnaire for additional information.   Signed,  Fabio Neighbors, LPN Nurse Health Advisor   MD Recommendations: Pt declined tetanus vaccine today. Pt to have Hepatitis C  screening with other BW, at next OV on 02/09/17.

## 2017-02-03 ENCOUNTER — Ambulatory Visit
Admission: RE | Admit: 2017-02-03 | Discharge: 2017-02-03 | Disposition: A | Discharge status: Home / Self Care | Payer: Medicare Other | Source: Ambulatory Visit | Attending: General Surgery | Admitting: General Surgery

## 2017-02-03 DIAGNOSIS — C50312 Malignant neoplasm of lower-inner quadrant of left female breast: Secondary | ICD-10-CM | POA: Diagnosis present

## 2017-02-03 DIAGNOSIS — Z853 Personal history of malignant neoplasm of breast: Secondary | ICD-10-CM | POA: Diagnosis not present

## 2017-02-03 DIAGNOSIS — Z08 Encounter for follow-up examination after completed treatment for malignant neoplasm: Secondary | ICD-10-CM | POA: Insufficient documentation

## 2017-02-09 ENCOUNTER — Ambulatory Visit (INDEPENDENT_AMBULATORY_CARE_PROVIDER_SITE_OTHER): Payer: Medicare Other | Admitting: General Surgery

## 2017-02-09 ENCOUNTER — Encounter: Payer: Self-pay | Admitting: General Surgery

## 2017-02-09 ENCOUNTER — Encounter: Payer: Self-pay | Admitting: Physician Assistant

## 2017-02-09 ENCOUNTER — Ambulatory Visit (INDEPENDENT_AMBULATORY_CARE_PROVIDER_SITE_OTHER): Payer: Medicare Other | Admitting: Physician Assistant

## 2017-02-09 VITALS — BP 138/88 | HR 92 | Temp 98.1°F | Ht 65.0 in | Wt 241.2 lb

## 2017-02-09 VITALS — BP 130/88 | HR 74 | Resp 13 | Ht 66.0 in | Wt 241.0 lb

## 2017-02-09 DIAGNOSIS — E039 Hypothyroidism, unspecified: Secondary | ICD-10-CM | POA: Diagnosis not present

## 2017-02-09 DIAGNOSIS — I10 Essential (primary) hypertension: Secondary | ICD-10-CM | POA: Diagnosis not present

## 2017-02-09 DIAGNOSIS — E038 Other specified hypothyroidism: Secondary | ICD-10-CM

## 2017-02-09 DIAGNOSIS — Z171 Estrogen receptor negative status [ER-]: Secondary | ICD-10-CM

## 2017-02-09 DIAGNOSIS — R739 Hyperglycemia, unspecified: Secondary | ICD-10-CM | POA: Diagnosis not present

## 2017-02-09 DIAGNOSIS — Z1159 Encounter for screening for other viral diseases: Secondary | ICD-10-CM | POA: Diagnosis not present

## 2017-02-09 DIAGNOSIS — E7800 Pure hypercholesterolemia, unspecified: Secondary | ICD-10-CM

## 2017-02-09 DIAGNOSIS — C50312 Malignant neoplasm of lower-inner quadrant of left female breast: Secondary | ICD-10-CM

## 2017-02-09 DIAGNOSIS — R748 Abnormal levels of other serum enzymes: Secondary | ICD-10-CM

## 2017-02-09 DIAGNOSIS — Z0000 Encounter for general adult medical examination without abnormal findings: Secondary | ICD-10-CM | POA: Diagnosis not present

## 2017-02-09 DIAGNOSIS — F17201 Nicotine dependence, unspecified, in remission: Secondary | ICD-10-CM

## 2017-02-09 DIAGNOSIS — J411 Mucopurulent chronic bronchitis: Secondary | ICD-10-CM | POA: Diagnosis not present

## 2017-02-09 NOTE — Progress Notes (Addendum)
74Patient ID: Michelle Pitts, female   DOB: [birth year], 66 y.o.   MRN: [id number]  Chief Complaint  Patient presents with  . Follow-up    mammogram     HPI Michelle Pitts is a 66 y.o. female who presents for a breast cancer follow up . The most recent mammogram was done on 02/03/2017.  Patient does perform regular self breast checks and gets regular mammograms done.    HPI  Past Medical History:  Diagnosis Date  . Anxiety   . Breast cancer ([place]) 01/23/2014   Left breast, T1c, N0, M0. ER/PR +; Her 2 neu not overexpressed. Oncotype DX: 13,Low risk.9% over 10 years.   . Cancer ([place]) 1995   vulvar, [facility]  . COPD (chronic obstructive pulmonary disease) ([facility])    bronchitis  . Dysrhythmia   . GERD (gastroesophageal reflux disease)   . History of colon polyps   . Hypercholesterolemia   . Hypertension   . Hypothyroidism   . Insomnia   . 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 Personal history of tobacco use, presenting hazards to health 11/18/2015  . Sleep apnea   . SOB (shortness of breath) on exertion   . Thyroid disease   . Vitamin D deficiency     Past Surgical History:  Procedure Laterality Date  . BLADDER SUSPENSION  2001  . BREAST BIOPSY Right 1975, 1985   benign cyst [name]) and fibroadenoma([facility])  . BREAST BIOPSY Right 01/03/2014   Papilloma, no atypia detected on mammography.  Marland Kitchen BREAST BIOPSY Left 01/03/2014   Invasive mammary carcinoma.  Marland Kitchen BREAST BIOPSY Left 01/13/2015   ultrasound giuded biopsy, negative  . BREAST CYST ASPIRATION Left 1985  . BREAST LUMPECTOMY Left 01/23/2014   positive  . BREAST SURGERY Left 01/23/14   wide excision   . BREAST SURGERY  04/10/14   Debridement of fat necrosis  . CHOLECYSTECTOMY    . COLONOSCOPY  2014   Dr Tiffany Kocher  . COLONOSCOPY WITH PROPOFOL N/A 10/10/2016   Procedure: COLONOSCOPY WITH PROPOFOL;  Surgeon: Manya Silvas, MD;  Location: [facility] ENDOSCOPY;  Service: Endoscopy;  Laterality: N/A;  . ROTATOR CUFF REPAIR  Right 1995  . VAGINAL HYSTERECTOMY  1978   partial  . wide excision debriedment Left 04/10/14    Family History  Problem Relation Age of Onset  . Diabetes Father   . COPD Mother   . Heart failure Sister   . Emphysema Sister   . Diabetes Sister   . Hypertension Sister   . Emphysema Brother   . Lung cancer Brother   . Crohn's disease Sister   . Hypertension Sister   . Hypertension Sister   . Emphysema Brother   . Cancer Other        great great paternal aunt, ? age  . Cancer Maternal Grandfather 59       ? source  . Cancer Paternal Grandfather 13       ? source  . Breast cancer Neg Hx     Social History Social History  Substance Use Topics  . Smoking status: Former Smoker    Packs/day: 1.00    Years: 50.00    Types: Cigarettes    Quit date: 09/15/2014  . Smokeless tobacco: Never Used  . Alcohol use 8.4 oz/week    14 Cans of beer per week    Allergies  Allergen Reactions  . Codeine Nausea And Vomiting  . Hydrochlorothiazide Swelling    Swelling of tongue.  [place] [  Levofloxacin In D5w] Hives and Swelling    Leg swelling Leg swelling    Current Outpatient Prescriptions  Medication Sig Dispense Refill  . albuterol (PROAIR HFA) 108 (90 Base) MCG/ACT inhaler Inhale 2 puffs into the lungs every 6 (six) hours as needed for wheezing or shortness of breath. 18 g 3  . ALPRAZolam (XANAX) 0.5 MG tablet Take 1 tablet (0.5 mg total) by mouth 2 (two) times daily. As needed. 60 tablet 0  . aspirin EC 81 MG tablet Take 81 mg by mouth daily.    . cholecalciferol (VITAMIN D) 1000 units tablet Take 1 tablet (1,000 Units total) by mouth daily. 30 tablet 11  . metoprolol succinate (TOPROL-XL) 25 MG 24 hr tablet take 1 tablet by mouth once daily 90 tablet 1  . varenicline (CHANTIX CONTINUING MONTH PAK) 1 MG tablet Take 1 tablet (1 mg total) by mouth 2 (two) times daily. 60 tablet 5   Current Facility-Administered Medications  Medication Dose Route Frequency Provider Last Rate  Last Dose  . levalbuterol (XOPENEX) nebulizer solution 1.25 mg  1.25 mg Nebulization Once Mar Daring, PA-C        Review of Systems Review of Systems  Constitutional: Negative.   Respiratory: Negative.   Cardiovascular: Negative.     Blood pressure 130/88, pulse 74, resp. rate 13, height 5\' 6"  (1.676 m), weight 241 lb (109.3 kg).  Physical Exam Physical Exam  Constitutional: She is oriented to person, place, and time. She appears well-developed and well-nourished.  Eyes: Conjunctivae are normal. No scleral icterus.  Neck: Neck supple.  Cardiovascular: Normal rate, regular rhythm and normal heart sounds.   Pulmonary/Chest: Effort normal and breath sounds normal. Right breast exhibits no inverted nipple, no mass, no nipple discharge, no skin change and no tenderness. Left breast exhibits no inverted nipple, no mass, no nipple discharge, no skin change and no tenderness.    Lymphadenopathy:    She has no cervical adenopathy.  Neurological: She is alert and oriented to person, place, and time.  Skin: Skin is warm and dry.    Data Reviewed 02/03/2017 bilateral diagnostic mammograms were independently reviewed. RECOMMENDATION: Diagnostic mammogram is suggested in 1 year. (Code:DM-B-01Y)  I have discussed the findings and recommendations with the patient. Results were also provided in writing at the conclusion of the visit. If applicable, a reminder letter will be sent to the patient regarding the next appointment.  BI-RADS CATEGORY  2: Benign.  Screening lung CT dated 12/02/2016: IMPRESSION: 1. Lung-RADS Category 2-S, benign appearance or behavior. Continue annual screening with low-dose chest CT without contrast in 12 months. 2. The "S" modifier above refers to potentially clinically significant non lung cancer related findings. Specifically, 1 vessel coronary atherosclerosis. 3. Aortic atherosclerosis. 4. Mild emphysema with mild diffuse bronchial wall  thickening, suggesting COPD. 5. Diffuse hepatic steatosis.   Assessment    No evidence of recurrent breast cancer.  Minimal enlargement of a non-mass density in the left lower lobe    Plan          The patient has been asked to return to the office in one year with a bilateral diagnostic mammogram. The patient is aware to call back for any questions or concerns.   HPI, Physical Exam, Assessment and Plan have been scribed under the direction and in the presence of Hervey Ard, MD.  Gaspar Cola, CMA  .I have completed the exam and reviewed the above documentation for accuracy and completeness.  I agree with the above.  Haematologist has been used and any errors in dictation or transcription are unintentional.  Hervey Ard, M.D., F.A.C.S.  Robert Bellow 02/10/2017, 8:26 PM

## 2017-02-09 NOTE — Patient Instructions (Signed)
Health Maintenance for Postmenopausal Women Menopause is a normal process in which your reproductive ability comes to an end. This process happens gradually over a span of months to years, usually between the ages of 46 and 71. Menopause is complete when you have missed 12 consecutive menstrual periods. It is important to talk with your health care provider about some of the most common conditions that affect postmenopausal women, such as heart disease, cancer, and bone loss (osteoporosis). Adopting a healthy lifestyle and getting preventive care can help to promote your health and wellness. Those actions can also lower your chances of developing some of these common conditions. What should I know about menopause? During menopause, you may experience a number of symptoms, such as:  Moderate-to-severe hot flashes.  Night sweats.  Decrease in sex drive.  Mood swings.  Headaches.  Tiredness.  Irritability.  Memory problems.  Insomnia.  Choosing to treat or not to treat menopausal changes is an individual decision that you make with your health care provider. What should I know about hormone replacement therapy and supplements? Hormone therapy products are effective for treating symptoms that are associated with menopause, such as hot flashes and night sweats. Hormone replacement carries certain risks, especially as you become older. If you are thinking about using estrogen or estrogen with progestin treatments, discuss the benefits and risks with your health care provider. What should I know about heart disease and stroke? Heart disease, heart attack, and stroke become more likely as you age. This may be due, in part, to the hormonal changes that your body experiences during menopause. These can affect how your body processes dietary fats, triglycerides, and cholesterol. Heart attack and stroke are both medical emergencies. There are many things that you can do to help prevent heart disease  and stroke:  Have your blood pressure checked at least every 1-2 years. High blood pressure causes heart disease and increases the risk of stroke.  If you are 59-78 years old, ask your health care provider if you should take aspirin to prevent a heart attack or a stroke.  Do not use any tobacco products, including cigarettes, chewing tobacco, or electronic cigarettes. If you need help quitting, ask your health care provider.  It is important to eat a healthy diet and maintain a healthy weight. ? Be sure to include plenty of vegetables, fruits, low-fat dairy products, and lean protein. ? Avoid eating foods that are high in solid fats, added sugars, or salt (sodium).  Get regular exercise. This is one of the most important things that you can do for your health. ? Try to exercise for at least 150 minutes each week. The type of exercise that you do should increase your heart rate and make you sweat. This is known as moderate-intensity exercise. ? Try to do strengthening exercises at least twice each week. Do these in addition to the moderate-intensity exercise.  Know your numbers.Ask your health care provider to check your cholesterol and your blood glucose. Continue to have your blood tested as directed by your health care provider.  What should I know about cancer screening? There are several types of cancer. Take the following steps to reduce your risk and to catch any cancer development as early as possible. Breast Cancer  Practice breast self-awareness. ? This means understanding how your breasts normally appear and feel. ? It also means doing regular breast self-exams. Let your health care provider know about any changes, no matter how small.  If you are 40  or older, have a clinician do a breast exam (clinical breast exam or CBE) every year. Depending on your age, family history, and medical history, it may be recommended that you also have a yearly breast X-ray (mammogram).  If you  have a family history of breast cancer, talk with your health care provider about genetic screening.  If you are at high risk for breast cancer, talk with your health care provider about having an MRI and a mammogram every year.  Breast cancer (BRCA) gene test is recommended for women who have family members with BRCA-related cancers. Results of the assessment will determine the need for genetic counseling and BRCA1 and for BRCA2 testing. BRCA-related cancers include these types: ? Breast. This occurs in males or females. ? Ovarian. ? Tubal. This may also be called fallopian tube cancer. ? Cancer of the abdominal or pelvic lining (peritoneal cancer). ? Prostate. ? Pancreatic.  Cervical, Uterine, and Ovarian Cancer Your health care provider may recommend that you be screened regularly for cancer of the pelvic organs. These include your ovaries, uterus, and vagina. This screening involves a pelvic exam, which includes checking for microscopic changes to the surface of your cervix (Pap test).  For women ages 21-65, health care providers may recommend a pelvic exam and a Pap test every three years. For women ages 79-65, they may recommend the Pap test and pelvic exam, combined with testing for human papilloma virus (HPV), every five years. Some types of HPV increase your risk of cervical cancer. Testing for HPV may also be done on women of any age who have unclear Pap test results.  Other health care providers may not recommend any screening for nonpregnant women who are considered low risk for pelvic cancer and have no symptoms. Ask your health care provider if a screening pelvic exam is right for you.  If you have had past treatment for cervical cancer or a condition that could lead to cancer, you need Pap tests and screening for cancer for at least 20 years after your treatment. If Pap tests have been discontinued for you, your risk factors (such as having a new sexual partner) need to be  reassessed to determine if you should start having screenings again. Some women have medical problems that increase the chance of getting cervical cancer. In these cases, your health care provider may recommend that you have screening and Pap tests more often.  If you have a family history of uterine cancer or ovarian cancer, talk with your health care provider about genetic screening.  If you have vaginal bleeding after reaching menopause, tell your health care provider.  There are currently no reliable tests available to screen for ovarian cancer.  Lung Cancer Lung cancer screening is recommended for adults 69-62 years old who are at high risk for lung cancer because of a history of smoking. A yearly low-dose CT scan of the lungs is recommended if you:  Currently smoke.  Have a history of at least 30 pack-years of smoking and you currently smoke or have quit within the past 15 years. A pack-year is smoking an average of one pack of cigarettes per day for one year.  Yearly screening should:  Continue until it has been 15 years since you quit.  Stop if you develop a health problem that would prevent you from having lung cancer treatment.  Colorectal Cancer  This type of cancer can be detected and can often be prevented.  Routine colorectal cancer screening usually begins at  age 62 and continues through age 47.  If you have risk factors for colon cancer, your health care provider may recommend that you be screened at an earlier age.  If you have a family history of colorectal cancer, talk with your health care provider about genetic screening.  Your health care provider may also recommend using home test kits to check for hidden blood in your stool.  A small camera at the end of a tube can be used to examine your colon directly (sigmoidoscopy or colonoscopy). This is done to check for the earliest forms of colorectal cancer.  Direct examination of the colon should be repeated every  5-10 years until age 30. However, if early forms of precancerous polyps or small growths are found or if you have a family history or genetic risk for colorectal cancer, you may need to be screened more often.  Skin Cancer  Check your skin from head to toe regularly.  Monitor any moles. Be sure to tell your health care provider: ? About any new moles or changes in moles, especially if there is a change in a mole's shape or color. ? If you have a mole that is larger than the size of a pencil eraser.  If any of your family members has a history of skin cancer, especially at a young age, talk with your health care provider about genetic screening.  Always use sunscreen. Apply sunscreen liberally and repeatedly throughout the day.  Whenever you are outside, protect yourself by wearing long sleeves, pants, a wide-brimmed hat, and sunglasses.  What should I know about osteoporosis? Osteoporosis is a condition in which bone destruction happens more quickly than new bone creation. After menopause, you may be at an increased risk for osteoporosis. To help prevent osteoporosis or the bone fractures that can happen because of osteoporosis, the following is recommended:  If you are 67-36 years old, get at least 1,000 mg of calcium and at least 600 mg of vitamin D per day.  If you are older than age 93 but younger than age 37, get at least 1,200 mg of calcium and at least 600 mg of vitamin D per day.  If you are older than age 1, get at least 1,200 mg of calcium and at least 800 mg of vitamin D per day.  Smoking and excessive alcohol intake increase the risk of osteoporosis. Eat foods that are rich in calcium and vitamin D, and do weight-bearing exercises several times each week as directed by your health care provider. What should I know about how menopause affects my mental health? Depression may occur at any age, but it is more common as you become older. Common symptoms of depression  include:  Low or sad mood.  Changes in sleep patterns.  Changes in appetite or eating patterns.  Feeling an overall lack of motivation or enjoyment of activities that you previously enjoyed.  Frequent crying spells.  Talk with your health care provider if you think that you are experiencing depression. What should I know about immunizations? It is important that you get and maintain your immunizations. These include:  Tetanus, diphtheria, and pertussis (Tdap) booster vaccine.  Influenza every year before the flu season begins.  Pneumonia vaccine.  Shingles vaccine.  Your health care provider may also recommend other immunizations. This information is not intended to replace advice given to you by your health care provider. Make sure you discuss any questions you have with your health care provider. Document Released: 09/23/2005  Document Revised: 02/19/2016 Document Reviewed: 05/05/2015 Elsevier Interactive Patient Education  2018 [employer]  

## 2017-02-09 NOTE — Patient Instructions (Signed)
The patient has been asked to return to the office in one year with a bilateral diagnostic mammogram. 

## 2017-02-09 NOTE — Progress Notes (Signed)
Patient: Michelle Pitts, Female    DOB: 10/15/1950, 66 y.o.   MRN: [id number] Visit Date: 02/09/2017  Today's Provider: Mar Daring, PA-C   Chief Complaint  Patient presents with  . Annual Exam   Subjective:    Annual physical exam Michelle Pitts is a 66 y.o. female who presents today for health maintenance and complete physical. She feels fairly well. She reports exercising none. She reports she is sleeping well.  She had her AWV with [facility] on 02/02/17. This note was reviewed.   Mammogram: 02/03/17 BiRads:2. Has appt with Dr. Bary Castilla today at [facility]. Patient has h/o breast cancer in the left breast.  Colonoscopy: 10/10/16; 1 polyp lymphoid aggregate; Dr. Vira Agar. Repeat in 5 years. Patient also gets regular low dose CT lung cancer screenings. Most recent one was done on 12/02/2016 and was essentially unchanged. Patient no longer gets pelvic exams due to having hysterectomy and has had enough negative exams after hysterectomy she was released.  -----------------------------------------------------------------   Review of Systems  Constitutional: Negative.   HENT: Positive for sneezing and tinnitus.   Eyes: Negative.   Respiratory: Positive for shortness of breath.   Cardiovascular: Negative.   Gastrointestinal: Negative.   Endocrine: Negative.   Genitourinary: Positive for enuresis (long standing issue).  Musculoskeletal: Negative.   Skin: Negative.   Allergic/Immunologic: Positive for environmental allergies.  Neurological: Negative.   Hematological: Negative.   Psychiatric/Behavioral: Negative.     Social History      She  reports that she quit smoking about 2 years ago. Her smoking use included Cigarettes. She has a 50.00 pack-year smoking history. She has never used smokeless tobacco. She reports that she drinks about 8.4 oz of alcohol per week . She reports that she does not use drugs.       Social History   Social History  . Marital status:  Divorced    Spouse name: N/A  . Number of children: N/A  . Years of education: N/A   Social History Main Topics  . Smoking status: Former Smoker    Packs/day: 1.00    Years: 50.00    Types: Cigarettes    Quit date: 09/15/2014  . Smokeless tobacco: Never Used  . Alcohol use 8.4 oz/week    14 Cans of beer per week  . Drug use: No  . Sexual activity: Not Asked   Other Topics Concern  . None   Social History Narrative  . None    Past Medical History:  Diagnosis Date  . Anxiety   . Breast cancer ([place]) 01/23/2014   Left breast, T1c, N0, M0. ER/PR +; Her 2 neu not overexpressed. Oncotype DX: 13,Low risk.9% over 10 years.   . Cancer ([place]) 1995   vulvar, [facility]  . COPD (chronic obstructive pulmonary disease) (HCC)    bronchitis  . Dysrhythmia   . GERD (gastroesophageal reflux disease)   . History of colon polyps   . Hypercholesterolemia   . Hypertension   . Hypothyroidism   . Insomnia   . 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 Personal history of tobacco use, presenting hazards to health 11/18/2015  . Sleep apnea   . SOB (shortness of breath) on exertion   . Thyroid disease   . Vitamin D deficiency      Patient Active Problem List   Diagnosis Date Noted  . DNR no code (do not resuscitate) 02/08/2016  . Personal history of tobacco use, presenting hazards to health  11/18/2015  . Breathlessness on exertion 07/24/2015  . Malignant neoplasm of lower-inner quadrant of left female breast ([place]) 07/22/2015  . Tachycardia 07/22/2015  . Fast heart beat 07/22/2015  . Anxiety 05/15/2015  . Colon polyp 05/15/2015  . CAFL (chronic airflow limitation) ([place]) 05/15/2015  . Elevated blood sugar 05/15/2015  . Abnormal liver enzymes 05/15/2015  . Family history of diabetes mellitus 05/15/2015  . Acid reflux 05/15/2015  . Hypercholesteremia 05/15/2015  . BP (high blood pressure) 05/15/2015  . Cannot sleep 05/15/2015  . Apnea, sleep 05/15/2015  . Subclinical hypothyroidism 05/15/2015  .  Avitaminosis D 05/15/2015  . 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 Abnormal laboratory test 05/15/2015  . Anxiety disorder 05/15/2015  . Chronic obstructive pulmonary disease ([place]) 05/15/2015  . Essential (primary) hypertension 05/15/2015  . Gastro-esophageal reflux disease without esophagitis 05/15/2015  . 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 Abnormal blood sugar 05/15/2015  . Adult hypothyroidism 05/15/2015  . Pure hypercholesterolemia 05/15/2015  . 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 Nicotine addiction 03/09/2015    Past Surgical History:  Procedure Laterality Date  . BLADDER SUSPENSION  2001  . BREAST BIOPSY Right 1975, 1985   benign cyst [name]) and fibroadenoma([facility])  . BREAST BIOPSY Right 01/03/2014   Papilloma, no atypia detected on mammography.  Marland Kitchen BREAST BIOPSY Left 01/03/2014   Invasive mammary carcinoma.  Marland Kitchen BREAST BIOPSY Left 01/13/2015   ultrasound giuded biopsy, negative  . BREAST CYST ASPIRATION Left 1985  . BREAST LUMPECTOMY Left 01/23/2014   positive  . BREAST SURGERY Left 01/23/14   wide excision   . BREAST SURGERY  04/10/14   Debridement of fat necrosis  . CHOLECYSTECTOMY    . COLONOSCOPY  2014   Dr Tiffany Kocher  . COLONOSCOPY WITH PROPOFOL N/A 10/10/2016   Procedure: COLONOSCOPY WITH PROPOFOL;  Surgeon: Manya Silvas, MD;  Location: [facility] ENDOSCOPY;  Service: Endoscopy;  Laterality: N/A;  . ROTATOR CUFF REPAIR Right 1995  . VAGINAL HYSTERECTOMY  1978   partial  . wide excision debriedment Left 04/10/14    Family History        Family Status  Relation Status  . Father Deceased  . Mother Deceased  . Sister Alive  . Brother Deceased at age 94  . Sister Alive  . Sister Alive  . Brother Deceased  . Other (Not Specified)  . MGF (Not Specified)  . PGF (Not Specified)  . Neg Hx (Not Specified)        Her family history includes COPD in her mother; Cancer in her other; Cancer (age of onset: 71) in her maternal grandfather;  Cancer (age of onset: 40) in her paternal grandfather; Crohn's disease in her sister; Diabetes in her father and sister; Emphysema in her brother, brother, and sister; Heart failure in her sister; Hypertension in her sister, sister, and sister; Lung cancer in her brother.     Allergies  Allergen Reactions  . Codeine Nausea And Vomiting  . Hydrochlorothiazide Swelling    Swelling of tongue.  [place] [Levofloxacin In D5w] Hives and Swelling    Leg swelling Leg swelling     Current Outpatient Prescriptions:  .  albuterol (PROAIR HFA) 108 (90 Base) MCG/ACT inhaler, Inhale 2 puffs into the lungs every 6 (six) hours as needed for wheezing or shortness of breath., Disp: 18 g, Rfl: 3 .  ALPRAZolam (XANAX) 0.5 MG tablet, Take 1 tablet (0.5 mg total) by mouth 2 (two) times daily. As needed., Disp: 60 tablet,  Rfl: 0 .  aspirin EC 81 MG tablet, Take 81 mg by mouth daily., Disp: , Rfl:  .  cholecalciferol (VITAMIN D) 1000 units tablet, Take 1 tablet (1,000 Units total) by mouth daily., Disp: 30 tablet, Rfl: 11 .  metoprolol succinate (TOPROL-XL) 25 MG 24 hr tablet, take 1 tablet by mouth once daily, Disp: 90 tablet, Rfl: 1 .  varenicline (CHANTIX CONTINUING MONTH PAK) 1 MG tablet, Take 1 tablet (1 mg total) by mouth 2 (two) times daily., Disp: 60 tablet, Rfl: 5  Current Facility-Administered Medications:  .  levalbuterol (XOPENEX) nebulizer solution 1.25 mg, 1.25 mg, Nebulization, Once, Mar Daring, PA-C   Patient Care Team: Mar Daring, PA-C as PCP - General (Family Medicine) Bary Castilla, Forest Gleason, MD (General Surgery)      Objective:   Vitals: BP 138/88 (BP Location: Left Arm, Patient Position: Sitting, Cuff Size: Large)   Pulse 92   Temp 98.1 F (36.7 C) (Oral)   Ht 5\' 5"  (1.651 m)   Wt 241 lb 3.2 oz (109.4 kg)   SpO2 97%   BMI 40.14 kg/m    Vitals:   02/09/17 1404  BP: 138/88  Pulse: 92  Temp: 98.1 F (36.7 C)  TempSrc: Oral  SpO2: 97%  Weight: 241 lb 3.2 oz  (109.4 kg)  Height: 5\' 5"  (1.651 m)     Physical Exam  Constitutional: She is oriented to person, place, and time. She appears well-developed and well-nourished. No distress.  HENT:  Head: Normocephalic and atraumatic.  Right Ear: Hearing, tympanic membrane, external ear and ear canal normal.  Left Ear: Hearing, tympanic membrane, external ear and ear canal normal.  Nose: Nose normal.  Mouth/Throat: Uvula is midline, oropharynx is clear and moist and mucous membranes are normal. No oropharyngeal exudate.  Eyes: Conjunctivae and EOM are normal. Pupils are equal, round, and reactive to light. Right eye exhibits no discharge. Left eye exhibits no discharge. No scleral icterus.  Neck: Normal range of motion. Neck supple. No JVD present. Carotid bruit is not present. No tracheal deviation present. No thyromegaly present.  Cardiovascular: Normal rate, regular rhythm, normal heart sounds and intact distal pulses.  Exam reveals no gallop and no friction rub.   No murmur heard. Pulmonary/Chest: Effort normal and breath sounds normal. No respiratory distress. She has no wheezes. She has no rales. She exhibits no tenderness.  Deferred to Dr. Bary Castilla  Abdominal: Soft. Bowel sounds are normal. She exhibits no distension and no mass. There is no tenderness. There is no rebound and no guarding.  Musculoskeletal: Normal range of motion. She exhibits no edema or tenderness.  Lymphadenopathy:    She has no cervical adenopathy.  Neurological: She is alert and oriented to person, place, and time.  Skin: Skin is warm and dry. No rash noted. She is not diaphoretic.  Psychiatric: She has a normal mood and affect. Her behavior is normal. Judgment and thought content normal.  Vitals reviewed.    Depression Screen PHQ 2/9 Scores 02/02/2017 02/08/2016 02/11/2015  PHQ - 2 Score 0 4 0  PHQ- 9 Score - 10 -      Assessment & Plan:     Routine Health Maintenance and Physical Exam  Exercise Activities and  Dietary recommendations Goals    . Have 3 meals a day          Recommend eating 3 small meals a day with 2 healthy snacks in between.        Immunization History  Administered  Date(s) Administered  . Influenza, High Dose Seasonal PF 06/25/2015, 06/02/2016  . Pneumococcal Conjugate-13 05/16/2014  . Pneumococcal Polysaccharide-23 06/16/2007, 02/08/2016  . Zoster 09/17/2007    Health Maintenance  Topic Date Due  . Hepatitis C Screening  [birth year]  . TETANUS/TDAP  08/15/2026 (Originally 05/14/1970)  . INFLUENZA VACCINE  03/15/2017  . MAMMOGRAM  02/04/2019  . COLONOSCOPY  10/10/2026  . DEXA SCAN  Completed  . PNA vac Low Risk Adult  Completed     Discussed health benefits of physical activity, and encouraged her to engage in regular exercise appropriate for her age and condition.    1. Annual physical exam Normal physical exam today. Will check labs as below and f/u pending lab results. If labs are stable and WNL she will not need to have these rechecked for one year at her next annual physical exam. She is to call the office in the meantime if she has any acute issue, questions or concerns.  2. Mucopurulent chronic bronchitis (HCC) Stable. Low dose CT lung cancer screen unchanged. Repeat in 12 months. Continue albuterol prn.   3. Essential hypertension Stable. Known aortic atherosclerosis noted on low dose CT. Patient is asymptomatic. Discussed cardiology referral. Patient reports she has seen Dr. Saralyn Pilar in the past and does not wish to have referral at this time. Will continue metoprolol 25mg  daily. Discussed adding a low dose 81 mg ASA daily.  - CBC w/Diff/Platelet - Comprehensive Metabolic Panel (CMET)  4. Subclinical hypothyroidism No symptoms. Will check labs as below and f/u pending results. - TSH  5. Tobacco abuse, in remission Used chantix to quit smoking. Doing well.  - CBC w/Diff/Platelet  6. Pure hypercholesterolemia Diet controlled. Will check labs as  below and f/u pending results.  - Lipid Profile  7. Elevated blood sugar Diet controlled. Will check labs as below and f/u pending results. - HgB A1c  8. Abnormal liver enzymes Known stable hepatic steatosis. Will check labs as below and f/u pending results.  - Comprehensive Metabolic Panel (CMET)  9. Need for hepatitis C screening test - Hepatitis C Antibody  --------------------------------------------------------------------    Mar Daring, PA-C  [facility]

## 2017-02-11 ENCOUNTER — Telehealth: Payer: Self-pay

## 2017-02-11 LAB — CBC WITH DIFFERENTIAL/PLATELET
BASOS ABS: 0 10*3/uL (ref 0.0–0.2)
Basos: 1 %
EOS (ABSOLUTE): 0.1 10*3/uL (ref 0.0–0.4)
Eos: 2 %
HEMOGLOBIN: 13.7 g/dL (ref 11.1–15.9)
Hematocrit: 40.9 % (ref 34.0–46.6)
IMMATURE GRANS (ABS): 0 10*3/uL (ref 0.0–0.1)
Immature Granulocytes: 0 %
LYMPHS: 31 %
Lymphocytes Absolute: 1.6 10*3/uL (ref 0.7–3.1)
MCH: 31.2 pg (ref 26.6–33.0)
MCHC: 33.5 g/dL (ref 31.5–35.7)
MCV: 93 fL (ref 79–97)
MONOCYTES: 8 %
Monocytes Absolute: 0.4 10*3/uL (ref 0.1–0.9)
NEUTROS ABS: 3.1 10*3/uL (ref 1.4–7.0)
Neutrophils: 58 %
Platelets: 258 10*3/uL (ref 150–379)
RBC: 4.39 x10E6/uL (ref 3.77–5.28)
RDW: 13.9 % (ref 12.3–15.4)
WBC: 5.2 10*3/uL (ref 3.4–10.8)

## 2017-02-11 LAB — COMPREHENSIVE METABOLIC PANEL
ALBUMIN: 4.5 g/dL (ref 3.6–4.8)
ALK PHOS: 165 IU/L — AB (ref 39–117)
ALT: 51 IU/L — AB (ref 0–32)
AST: 40 IU/L (ref 0–40)
Albumin/Globulin Ratio: 1.8 (ref 1.2–2.2)
BILIRUBIN TOTAL: 0.5 mg/dL (ref 0.0–1.2)
BUN / CREAT RATIO: 18 (ref 12–28)
BUN: 15 mg/dL (ref 8–27)
CHLORIDE: 104 mmol/L (ref 96–106)
CO2: 24 mmol/L (ref 20–29)
CREATININE: 0.84 mg/dL (ref 0.57–1.00)
Calcium: 10.1 mg/dL (ref 8.7–10.3)
GFR calc non Af Amer: 72 mL/min/{1.73_m2} (ref >59)
GFR, EST AFRICAN AMERICAN: 83 mL/min/{1.73_m2} (ref >59)
GLUCOSE: 122 mg/dL — AB (ref 65–99)
Globulin, Total: 2.5 g/dL (ref 1.5–4.5)
Potassium: 4.9 mmol/L (ref 3.5–5.2)
Sodium: 145 mmol/L — ABNORMAL HIGH (ref 134–144)
TOTAL PROTEIN: 7 g/dL (ref 6.0–8.5)

## 2017-02-11 LAB — LIPID PANEL
Chol/HDL Ratio: 2.9 ratio (ref 0.0–4.4)
Cholesterol, Total: 227 mg/dL — ABNORMAL HIGH (ref 100–199)
HDL: 79 mg/dL (ref >39)
LDL Calculated: 124 mg/dL — ABNORMAL HIGH (ref 0–99)
TRIGLYCERIDES: 122 mg/dL (ref 0–149)
VLDL CHOLESTEROL CAL: 24 mg/dL (ref 5–40)

## 2017-02-11 LAB — HEMOGLOBIN A1C
Est. average glucose Bld gHb Est-mCnc: 108 mg/dL
HEMOGLOBIN A1C: 5.4 % (ref 4.8–5.6)

## 2017-02-11 LAB — TSH: TSH: 4.54 u[IU]/mL — AB (ref 0.450–4.500)

## 2017-02-11 LAB — HEPATITIS C ANTIBODY: Hep C Virus Ab: <0.1 s/co ratio (ref 0.0–0.9)

## 2017-02-11 NOTE — Telephone Encounter (Signed)
I am not worried about her sugar at this time as her A1c was 5.4 which is normal.   TSH she has been asymptomatic. At that level we would just monitor. We could recheck in 8 weeks if she desires. It could very likely return to normal.

## 2017-02-11 NOTE — Telephone Encounter (Signed)
-----   Message from Mar Daring, PA-C sent at 02/11/2017  8:58 AM EDT ----- All labs are within normal limits and/or stable from labs last year.  Thanks! -JB

## 2017-02-11 NOTE — Telephone Encounter (Signed)
Patient advised as directed below. She states that she saw her results on MyChart and that she notice her TSH level is higher and she wants to know what to do or what is your input and that her glucose level was also high and she was fasting. Please advise.  Thanks,  -Joseline

## 2017-02-13 NOTE — Telephone Encounter (Signed)
NA

## 2017-02-20 NOTE — Telephone Encounter (Signed)
NA

## 2017-03-21 ENCOUNTER — Ambulatory Visit (INDEPENDENT_AMBULATORY_CARE_PROVIDER_SITE_OTHER): Payer: Medicare Other | Admitting: Physician Assistant

## 2017-03-21 ENCOUNTER — Encounter: Payer: Self-pay | Admitting: Physician Assistant

## 2017-03-21 VITALS — BP 132/62 | HR 82 | Temp 97.8°F | Resp 18 | Wt 242.0 lb

## 2017-03-21 DIAGNOSIS — J680 Bronchitis and pneumonitis due to chemicals, gases, fumes and vapors: Secondary | ICD-10-CM

## 2017-03-21 DIAGNOSIS — E039 Hypothyroidism, unspecified: Secondary | ICD-10-CM | POA: Diagnosis not present

## 2017-03-21 DIAGNOSIS — R5383 Other fatigue: Secondary | ICD-10-CM | POA: Diagnosis not present

## 2017-03-21 DIAGNOSIS — I10 Essential (primary) hypertension: Secondary | ICD-10-CM

## 2017-03-21 DIAGNOSIS — J449 Chronic obstructive pulmonary disease, unspecified: Secondary | ICD-10-CM | POA: Diagnosis not present

## 2017-03-21 DIAGNOSIS — J301 Allergic rhinitis due to pollen: Secondary | ICD-10-CM | POA: Diagnosis not present

## 2017-03-21 MED ORDER — ALBUTEROL SULFATE HFA 108 (90 BASE) MCG/ACT IN AERS: 2.0000 | INHALATION_SPRAY | Freq: Four times a day (QID) | RESPIRATORY_TRACT | 3 refills | Status: DC | PRN

## 2017-03-21 MED ORDER — FLUTICASONE PROPIONATE 50 MCG/ACT NA SUSP: 2.0000 | Freq: Every day | NASAL | 6 refills | Status: DC

## 2017-03-21 MED ORDER — METOPROLOL SUCCINATE ER 25 MG PO TB24: 25.0000 mg | ORAL_TABLET | Freq: Every day | ORAL | 1 refills | Status: DC

## 2017-03-21 MED ORDER — AZITHROMYCIN 250 MG PO TABS: ORAL_TABLET | ORAL | 0 refills | Status: DC

## 2017-03-21 MED ORDER — PREDNISONE 10 MG (21) PO TBPK: ORAL_TABLET | ORAL | 0 refills | Status: DC

## 2017-03-21 NOTE — Patient Instructions (Signed)
Eating Healthy on a Budget There are many ways to save money at the grocery store and continue to eat healthy. You can be successful if you plan your meals according to your budget, purchase according to your budget and grocery list, and prepare food yourself. How can I buy more food on a limited budget? Plan  Plan meals and snacks according to a grocery list and budget you create.  Look for recipes where you can cook once and make enough food for two meals.  Include meals that will "stretch" more expensive foods such as stews, casseroles, and stir-fry dishes.  Make a grocery list and make sure to bring it with you to the store. If you have a smart phone, you could use your phone to create your shopping list. Purchase  When grocery shopping, buy only the items on your grocery list and go only to the areas of the store that have the items on your list. Prepare  Some meal items can be prepared in advance. Pre-cook on days when you have extra time.  Make extra food (such as by doubling recipes) and freeze the extras in meal-sized containers or in individual portions for fast meals and snacks.  Use leftovers in your meal plan for the week.  Try some meatless meals or try "no cook" meals like salads.  When you come home from the grocery store, wash and prepare your fruits and vegetables so they are ready to use and eat. This will help reduce food waste. How can I buy more food on a limited budget? Try these tips the next time you go shopping:  [place] store brands or generic brands.  Use coupons only for foods and brands you normally buy. Avoid buying items you wouldn't normally buy simply because they are on sale.  Check online and in newspapers for weekly deals.  Buy healthy items from the bulk bins when available, such as herbs, spices, flours, pastas, nuts, and dried fruit.  Buy fruits and vegetables that are in season. Prices are usually lower on in-season produce.  Compare and  contrast different items. You can do this by looking at the unit price on the price tag. Use it to compare different brands and sizes to find out which item is the best deal.  Choose naturally low-cost healthy items, such as carrots, potatoes, apples, bananas, and oranges. Dried or canned beans are a low-cost protein source.  Buy in bulk and freeze extra food. Items you can buy in bulk include meats, fish, poultry, frozen fruits, and frozen vegetables.  Limit the purchase of prepared or "ready-to-eat" foods, such as pre-cut fruits and vegetables and pre-made salads.  If possible, shop around to discover which grocery store offers the best prices. Some stores charge much more than other stores for the same items.  Do not shop when you are hungry. If you shop while hungry, It may be hard to stick to your list and budget.  Stick to your list and resist impulse buys. Treat your list as your official plan for the week.  Buy a variety of vegetables and fruit by purchasing fresh, frozen, and canned items.  Look beyond eye level. Foods at eye level (adult or child eye level) are more expensive. Look at the top and bottom shelves for deals.  Be efficient with your time when shopping. The more time you spend at the store, the more money you are likely to spend.  Consider other retailers such as dollar stores, larger wholesale  stores, local fruit and vegetable stands, and farmers markets.  What are some tips for less expensive food substitutions? When choosing more expensive foods like meats and dairy, try these tips to save money:  Choose cheaper cuts of meat, such as bone-in chicken thighs and drumsticks instead skinless and boneless chicken. When you are ready to prepare the chicken, you can remove the skin yourself to make it healthier.  Choose lean meats like chicken or [place]. When choosing ground beef, make sure it is lean ground beef (92% lean, 8% fat). If you do buy a fattier ground beef,  drain the fat before eating.  Buy dried beans and peas, such as lentils, split peas, or kidney beans.  For seafood, choose canned tuna, salmon, or sardines.  Eggs are a low-cost source of protein.  Buy the larger tubs of yogurt instead of individual-sized containers.  Choose water instead of sodas and other sweetened beverages.  Skip buying chips, cookies, and other "junk food". These items are usually expensive, high in calories, and low in nutritional value.  How can I prepare the foods I buy in the healthiest way? Practice these tips for cooking foods in the healthiest way to reduce excess fat and calorie intake:  Steam, saute, grill, or bake foods instead of frying them.  Make sure half your plate is filled with fruits or vegetables. Choose from fresh, frozen, or canned fruits and vegetables. If eating canned, remember to rinse them before eating. This will remove any excess salt added for packaging.  Trim all fat from meat before cooking. Remove the skin from chicken or [place].  Spoon off fat from meat dishes once they have been chilled in the refrigerator and the fat has hardened on the top.  Use skim milk, low-fat milk, or evaporated skim milk when making cream sauces, soups, or puddings.  Substitute low-fat yogurt, sour cream, or cottage cheese for sour cream and mayonnaise in dips and dressings.  Try lemon juice, herbs, or spices to season food instead of salt, butter, or margarine.  This information is not intended to replace advice given to you by your health care provider. Make sure you discuss any questions you have with your health care provider. Document Released: 04/04/2014 Document Revised: 02/19/2016 Document Reviewed: 03/04/2014 Elsevier Interactive Patient Education  [employer].

## 2017-03-21 NOTE — Progress Notes (Signed)
Patient: Michelle Pitts Female    DOB: [birth year]   66 y.o.   MRN: [id number] Visit Date: 03/21/2017  Today's Provider: Mar Daring, PA-C   Chief Complaint  Patient presents with  . Cough  . Sore Throat   Subjective:    HPI Pt is here today for cough and congestion she reports that it started about 1 week ago after using almost a entire bottle of carpet cleaner and not wearing a mask. She woke up the next day with swollen lymph gland in her neck and sore throat and headache. She denies any fever of chills. She also reports that she has bright red blood in her nasal discharge and post nasal drainage. She has been taking her inhalers, Advair and Albuterol.   She is also concerned about fatigue that she has had since she was last seen at her CPE. She reports it most often occurs after eating. She reports she only eats once daily. About an hour after she eats she feels exhausted and has to lie down. She does have hypothyroid issues within her family (sister) and last check her TSH was borderline high. She denies nighttime awakenings with exception of awakening once nightly for restroom. She has had a sleep study in the past that has been normal.     Allergies  Allergen Reactions  . Codeine Nausea And Vomiting  . Hydrochlorothiazide Swelling    Swelling of tongue.  Mack Hook [Levofloxacin In D5w] Hives and Swelling    Leg swelling Leg swelling     Current Outpatient Prescriptions:  .  albuterol (PROAIR HFA) 108 (90 Base) MCG/ACT inhaler, Inhale 2 puffs into the lungs every 6 (six) hours as needed for wheezing or shortness of breath., Disp: 18 g, Rfl: 3 .  ALPRAZolam (XANAX) 0.5 MG tablet, Take 1 tablet (0.5 mg total) by mouth 2 (two) times daily. As needed., Disp: 60 tablet, Rfl: 0 .  aspirin EC 81 MG tablet, Take 81 mg by mouth daily., Disp: , Rfl:  .  cholecalciferol (VITAMIN D) 1000 units tablet, Take 1 tablet (1,000 Units total) by mouth daily., Disp: 30 tablet,  Rfl: 11 .  metoprolol succinate (TOPROL-XL) 25 MG 24 hr tablet, take 1 tablet by mouth once daily, Disp: 90 tablet, Rfl: 1 .  varenicline (CHANTIX CONTINUING MONTH PAK) 1 MG tablet, Take 1 tablet (1 mg total) by mouth 2 (two) times daily., Disp: 60 tablet, Rfl: 5  Current Facility-Administered Medications:  .  levalbuterol (XOPENEX) nebulizer solution 1.25 mg, 1.25 mg, Nebulization, Once, Enzo Treu M, PA-C  Review of Systems  Constitutional: Positive for fatigue.  HENT: Positive for congestion, postnasal drip, rhinorrhea, sinus pain, sinus pressure, sneezing and sore throat.   Eyes: Negative.   Respiratory: Positive for cough, shortness of breath and wheezing.   Cardiovascular: Negative.   Gastrointestinal: Negative.   Endocrine: Negative.   Genitourinary: Negative.   Musculoskeletal: Negative.   Skin: Negative.   Allergic/Immunologic: Negative.   Neurological: Positive for headaches.  Hematological: Negative.   Psychiatric/Behavioral: Negative.     Social History  Substance Use Topics  . Smoking status: Former Smoker    Packs/day: 1.00    Years: 50.00    Types: Cigarettes    Quit date: 09/15/2014  . Smokeless tobacco: Never Used  . Alcohol use 8.4 oz/week    14 Cans of beer per week   Objective:   BP 132/62 (BP Location: Left Arm, Patient Position: Sitting, Cuff Size: Large)  Pulse 82   Temp 97.8 F (36.6 C) (Oral)   Resp 18   Wt 242 lb (109.8 kg)   SpO2 98%   BMI 39.06 kg/m  Vitals:   03/21/17 1004  BP: 132/62  Pulse: 82  Resp: 18  Temp: 97.8 F (36.6 C)  TempSrc: Oral  SpO2: 98%  Weight: 242 lb (109.8 kg)     Physical Exam  Constitutional: She appears well-developed and well-nourished. No distress.  HENT:  Head: Normocephalic and atraumatic.  Right Ear: Hearing, tympanic membrane, external ear and ear canal normal.  Left Ear: Hearing, tympanic membrane, external ear and ear canal normal.  Nose: Nose normal.  Mouth/Throat: Uvula is midline,  oropharynx is clear and moist and mucous membranes are normal. No oropharyngeal exudate.  Eyes: Pupils are equal, round, and reactive to light. Conjunctivae are normal. Right eye exhibits no discharge. Left eye exhibits no discharge. No scleral icterus.  Neck: Normal range of motion. Neck supple. No tracheal deviation present. No thyromegaly present.  Cardiovascular: Normal rate, regular rhythm and normal heart sounds.  Exam reveals no gallop and no friction rub.   No murmur heard. Pulmonary/Chest: Effort normal. No stridor. No respiratory distress. She has no decreased breath sounds. She has wheezes in the right upper field and the left upper field. She has rhonchi in the right middle field, the right lower field, the left middle field and the left lower field. She has no rales.  Lymphadenopathy:    She has no cervical adenopathy.  Skin: Skin is warm and dry. She is not diaphoretic.  Vitals reviewed.      Assessment & Plan:     1. Bronchitis due to chemical ([place]) Worsening. Suspect chemical irritation from carpet cleaner that has caused bronchitis exacerbation. Will give zpak and prednisone as below. She is to continue her advair. Advised to use BID instead of once daily. Albuterol prn for SOB. Make sure to rinse mouth afterwards to prevent thrush. She is to call if symptoms worsen.  - azithromycin (ZITHROMAX) 250 MG tablet; Take 2 tablets PO on day one, and one tablet PO daily thereafter until completed.  Dispense: 6 tablet; Refill: 0 - predniSONE (STERAPRED UNI-PAK 21 TAB) 10 MG (21) TBPK tablet; Take as directed on package instructions; 6 day taper  Dispense: 21 tablet; Refill: 0  2. Essential hypertension Stable. Diagnosis pulled for medication refill. Continue current medical treatment plan. - metoprolol succinate (TOPROL-XL) 25 MG 24 hr tablet; Take 1 tablet (25 mg total) by mouth daily.  Dispense: 90 tablet; Refill: 1  3. Non-seasonal allergic rhinitis due to pollen Stable. Diagnosis  pulled for medication refill. Continue current medical treatment plan. - fluticasone (FLONASE) 50 MCG/ACT nasal spray; Place 2 sprays into both nostrils daily.  Dispense: 16 g; Refill: 6  4. Adult hypothyroidism Worsening fatigue. TSH was borderline on 02/09/17 so will recheck to see if labs have worsened. May require levothyroxine if TSH worsened.  - TSH - T4  5. CAFL (chronic airflow limitation) (HCC) Stable. Diagnosis pulled for medication refill. Continue current medical treatment plan. - albuterol (PROAIR HFA) 108 (90 Base) MCG/ACT inhaler; Inhale 2 puffs into the lungs every 6 (six) hours as needed for wheezing or shortness of breath.  Dispense: 18 g; Refill: 3  6. Fatigue, unspecified type See above medical treatment plan for #4. - TSH - T4       Mar Daring, PA-C  [facility]

## 2017-03-22 ENCOUNTER — Telehealth: Payer: Self-pay

## 2017-03-22 LAB — T4: T4, Total: 7.5 ug/dL (ref 4.5–12.0)

## 2017-03-22 LAB — TSH: TSH: 3.83 u[IU]/mL (ref 0.450–4.500)

## 2017-03-22 NOTE — Telephone Encounter (Signed)
-----   Message from Mar Daring, PA-C sent at 03/22/2017  8:25 AM EDT ----- Thyroid is in normal range currently.

## 2017-03-22 NOTE — Telephone Encounter (Signed)
Patient advised as below.  

## 2017-05-17 ENCOUNTER — Ambulatory Visit
Admission: RE | Admit: 2017-05-17 | Discharge: 2017-05-17 | Disposition: A | Discharge status: Home / Self Care | Payer: Medicare Other | Source: Ambulatory Visit | Attending: Physician Assistant | Admitting: Physician Assistant

## 2017-05-17 ENCOUNTER — Encounter: Payer: Self-pay | Admitting: Physician Assistant

## 2017-05-17 ENCOUNTER — Ambulatory Visit (INDEPENDENT_AMBULATORY_CARE_PROVIDER_SITE_OTHER): Payer: Medicare Other | Admitting: Physician Assistant

## 2017-05-17 VITALS — BP 130/80 | HR 80 | Temp 98.4°F | Resp 20 | Ht 65.0 in | Wt 248.6 lb

## 2017-05-17 DIAGNOSIS — W2203XA Walked into furniture, initial encounter: Secondary | ICD-10-CM | POA: Insufficient documentation

## 2017-05-17 DIAGNOSIS — S99922A Unspecified injury of left foot, initial encounter: Secondary | ICD-10-CM

## 2017-05-17 DIAGNOSIS — Z6841 Body Mass Index (BMI) 40.0 and over, adult: Secondary | ICD-10-CM

## 2017-05-17 DIAGNOSIS — Z23 Encounter for immunization: Secondary | ICD-10-CM | POA: Diagnosis not present

## 2017-05-17 DIAGNOSIS — Z713 Dietary counseling and surveillance: Secondary | ICD-10-CM | POA: Diagnosis not present

## 2017-05-17 MED ORDER — PHENTERMINE HCL 15 MG PO CAPS: 15.0000 mg | ORAL_CAPSULE | ORAL | 0 refills | Status: DC

## 2017-05-17 NOTE — Progress Notes (Signed)
Patient: Michelle Pitts Female    DOB: 1951-03-13   66 y.o.   MRN: [id number] Visit Date: 05/17/2017  Today's Provider: Mar Daring, PA-C   Chief Complaint  Patient presents with  . Fatigue   Subjective:    HPI Patient here today C/O persistent fatigue, patient was seen on 03/21/2017. Patient had labs done to check for TSH and T4 both results normal. Patient reports fatigue and breathing are worse. Patient reports getting short of breath with any activity. Patient reports fatigue is worse during the morning and mid day. Patient reports sleeping well, patient reports getting 6 or more hours of sleep during the night. Patient reports just being very sleepy. Patient reports using albuterol inhaler for shortness of breath, reports no improvement with inhaler. Patient reports symptoms may be cause by her weight.   Patient C/O left small toe injury last night, pt reports she slammed her toe in to a dresser at her home. Patient reports pain, bruising and color change on toe.        Allergies  Allergen Reactions  . Codeine Nausea And Vomiting  . Hydrochlorothiazide Swelling    Swelling of tongue.  Mack Hook [Levofloxacin In D5w] Hives and Swelling    Leg swelling Leg swelling     Current Outpatient Prescriptions:  .  albuterol (PROAIR HFA) 108 (90 Base) MCG/ACT inhaler, Inhale 2 puffs into the lungs every 6 (six) hours as needed for wheezing or shortness of breath., Disp: 18 g, Rfl: 3 .  ALPRAZolam (XANAX) 0.5 MG tablet, Take 1 tablet (0.5 mg total) by mouth 2 (two) times daily. As needed., Disp: 60 tablet, Rfl: 0 .  aspirin EC 81 MG tablet, Take 81 mg by mouth daily., Disp: , Rfl:  .  cholecalciferol (VITAMIN D) 1000 units tablet, Take 1 tablet (1,000 Units total) by mouth daily., Disp: 30 tablet, Rfl: 11 .  metoprolol succinate (TOPROL-XL) 25 MG 24 hr tablet, Take 1 tablet (25 mg total) by mouth daily., Disp: 90 tablet, Rfl: 1 .  Fluticasone-Salmeterol (ADVAIR)  250-50 MCG/DOSE AEPB, Inhale 1 puff into the lungs 2 (two) times daily., Disp: , Rfl:   Current Facility-Administered Medications:  .  levalbuterol (XOPENEX) nebulizer solution 1.25 mg, 1.25 mg, Nebulization, Once, Xaden Kaufman M, PA-C  Review of Systems  Constitutional: Positive for fatigue.  Respiratory: Negative.   Cardiovascular: Negative.   Gastrointestinal: Negative.   Musculoskeletal: Positive for arthralgias.       Left small toe injury  Neurological: Negative.   Psychiatric/Behavioral: Negative.     Social History  Substance Use Topics  . Smoking status: Former Smoker    Packs/day: 1.00    Years: 50.00    Types: Cigarettes    Quit date: 09/15/2014  . Smokeless tobacco: Never Used  . Alcohol use 8.4 oz/week    14 Cans of beer per week   Objective:   BP 130/80 (BP Location: Left Arm, Patient Position: Sitting, Cuff Size: Large)   Pulse 80   Temp 98.4 F (36.9 C) (Oral)   Resp 20   Ht 5\' 5"  (1.651 m)   Wt 248 lb 9.6 oz (112.8 kg)   SpO2 96%   BMI 41.37 kg/m  Vitals:   05/17/17 1335  BP: 130/80  Pulse: 80  Resp: 20  Temp: 98.4 F (36.9 C)  TempSrc: Oral  SpO2: 96%  Weight: 248 lb 9.6 oz (112.8 kg)  Height: 5\' 5"  (1.651 m)     Physical  Exam  Constitutional: She appears well-developed and well-nourished. No distress.  Obese  Neck: Normal range of motion. Neck supple.  Cardiovascular: Normal rate, regular rhythm and normal heart sounds.  Exam reveals no gallop and no friction rub.   No murmur heard. Pulmonary/Chest: Effort normal and breath sounds normal. No respiratory distress. She has no wheezes. She has no rales.  Musculoskeletal:       Right foot: Normal. There is normal range of motion, no tenderness, no bony tenderness, no swelling, normal capillary refill, no crepitus, no deformity and no laceration.       Left foot: There is decreased range of motion, tenderness, bony tenderness and swelling. There is normal capillary refill, no crepitus, no  deformity and no laceration.       Feet:  Skin: She is not diaphoretic.  Vitals reviewed.      Assessment & Plan:     1. Encounter for weight loss counseling Patient is already registered for a medical weight loss class at [facility] and is also interested in medical nutrition counseling for weight loss. Will also start phentermine 15mg  as below. I will see her back in 4 weeks to see how she is doing and check weight loss progress. Also to see if her energy and fatigue levels are improving with weight loss.  - Amb ref to Medical Nutrition Therapy-[facility] - phentermine 15 MG capsule; Take 1 capsule (15 mg total) by mouth every morning.  Dispense: 30 capsule; Refill: 0  2. Class 3 severe obesity due to excess calories with serious comorbidity and body mass index (BMI) of 40.0 to 44.9 in adult [facility]) See above medical treatment plan. - Amb ref to Medical Nutrition Therapy-[facility] - phentermine 15 MG capsule; Take 1 capsule (15 mg total) by mouth every morning.  Dispense: 30 capsule; Refill: 0  3. Toe injury, left, initial encounter Will get imaging as below to r/o fracture. Suspect bone contusion. Advised to ice today and then start applying heat tomorrow. Rest. May buddy tape for support. Call if symptoms worsen.  - DG Foot Complete Left; Future  4. Need for influenza vaccination Flu vaccine given today without complication. Patient sat upright for 15 minutes to check for adverse reaction before being released. - Flu vaccine HIGH DOSE PF       Mar Daring, PA-C  [facility]

## 2017-05-17 NOTE — Patient Instructions (Signed)

## 2017-05-18 ENCOUNTER — Telehealth: Payer: Self-pay

## 2017-05-18 NOTE — Telephone Encounter (Signed)
-----   Message from Mar Daring, [place] sent at 05/17/2017  4:29 PM EDT ----- No fracture noted in foot. Arthritic changes noted in 1st joint at big toe. Most likley bone bruise. Can still treat conservatively with ice today then heat tomorrow. Bone contusions can take upwards of 8-12 weeks for complete resolution. Please let me know if symptoms worsen.

## 2017-05-18 NOTE — Telephone Encounter (Signed)
[id number] 

## 2017-05-18 NOTE — Telephone Encounter (Signed)
Patient has been advised. KW 

## 2017-05-19 ENCOUNTER — Encounter: Payer: Self-pay | Admitting: Physician Assistant

## 2017-06-02 ENCOUNTER — Encounter: Payer: Medicare Other | Attending: Geriatric Medicine | Admitting: Dietician

## 2017-06-02 ENCOUNTER — Encounter: Payer: Self-pay | Admitting: Dietician

## 2017-06-02 VITALS — Ht 64.0 in | Wt 245.2 lb

## 2017-06-02 DIAGNOSIS — Z6841 Body Mass Index (BMI) 40.0 and over, adult: Secondary | ICD-10-CM | POA: Diagnosis present

## 2017-06-02 DIAGNOSIS — E66813 Obesity, class 3: Secondary | ICD-10-CM

## 2017-06-02 DIAGNOSIS — Z713 Dietary counseling and surveillance: Secondary | ICD-10-CM

## 2017-06-02 NOTE — Patient Instructions (Addendum)
   For smoothies: start with a liquid base (1/3-1/2 cup water, skim milk, almond milk, etc), a protein source (1/3 cup oats, 1 serving yogurt, slim fast powder, etc), spinach or other vegetable (handfull), fruit 1/3-1/2 cup, 1Tbs of nut butter, chia seeds or ground flax seeds, add ice to personal preference  Consider buying low sodium frozen meals or soups (mg of sodium around 400mg  or less) instead of eating out. Eating well and Healthy Choice brands are typically good frozen meal brand choices  Practice adjusting your sleep/ wake schedule to eventually allow yourself enough time to eat breakfast in the morning  Your day may start to look like this: light breakfast, normal lunch, light dinner (IE frozen meal)

## 2017-06-02 NOTE — Progress Notes (Signed)
Medical Nutrition Therapy: Visit start time: 1330  end time: 1430  Assessment:  Diagnosis: obesity, need for weight loss counseling Past medical history: anxiety, hypothyroidism, hypercholesterolemia, GERD, see chart Psychosocial issues/ stress concerns: none Preferred learning method:  . Hands-on  Current weight: 245.2#  Height: 64" Medications, supplements: metoprolol succinate, Vit D3, aspirin  Progress and evaluation: Patient desires to make lifestyle changes that will improve her overall health and help her to lose weight. Reports wt gain of 60# since 2015. States she typically eats 1 meal/day and sometimes an evening snack. Does not get hungry until later in the day. If she eats breakfast she usually has 1 more meal at approx 6pm. She does not cook; goes out for meals to locations such as [employer], [employer], [employer], [employer], [employer], [employer] (life sciences). Typically chooses a meat + vegetable and potatoes. Does not like sweets and does not eat pork. She recently purchased a blender to encourage herself to start to eat breakfast.    Physical activity: Back pain prevents at this time. Future goal to start doing water aerobics  Dietary Intake:  Usual eating pattern includes 1 meals and 0-1 snacks per day. Dining out frequency: 7-14 meals per week.  Breakfast: occasionally; 2 eggs + grits + bacon, 1 slice of toast, Fair Life protein drink Snack: n/a Lunch: n/a Snack: n/a Supper: salmon, broccoli, salad, baked potato, steak, ribs, chicken Snack: [place] snack packs occasionally Beverages: 1-2 cups of coffee, water, 1 glass of white wine, 1/2 and 1/2 sweet tea (rarely)  Nutrition Care Education: Topics covered: ways to reduce fat and total calories when eating out, adding protein to meals, types of fats Basic nutrition: basic food groups, appropriate nutrient balance, appropriate meal and snack schedule, general nutrition guidelines    Weight control:  benefits of weight control, behavioral changes for weight loss Advanced nutrition: dining out, food label reading Other lifestyle changes:  benefits of making changes, increasing motivation, readiness for change, identifying habits that need to change  Nutritional Diagnosis:  [place]-3.3 Overweight/obesity As related to lifestyle habits.  As evidenced by BMI 42.09. NI-1.6 Predicted suboptional energy  As related to consuming 1 meal/day.  As evidenced by patient report of low energy and minimal hunger.  Intervention: Discussion as noted above. Patient will begin to integrate healthier lifestyle habits by adjusting her sleep / wake schedule in a way that allows her to eat breakfast. She may start having dinner at home via buying frozen meals/ canned soups that require minimal preparation.  Education Materials given:  . Recipes for smoothies . Goals/ instructions  Learner/ who was taught:  . Patient  Level of understanding: Marland Kitchen Verbalizes/ demonstrates competency  Demonstrated degree of understanding via:   Teach back Learning barriers: . None  Willingness to learn/ readiness for change: . Acceptance, ready for change  Monitoring and Evaluation:  Dietary intake, exercise, and body weight      follow up: prn

## 2017-06-14 ENCOUNTER — Ambulatory Visit: Payer: Self-pay | Admitting: Physician Assistant

## 2017-06-19 ENCOUNTER — Encounter: Payer: Self-pay | Admitting: Physician Assistant

## 2017-06-19 ENCOUNTER — Ambulatory Visit: Payer: Medicare Other | Admitting: Physician Assistant

## 2017-06-19 DIAGNOSIS — Z713 Dietary counseling and surveillance: Secondary | ICD-10-CM

## 2017-06-19 DIAGNOSIS — Z6841 Body Mass Index (BMI) 40.0 and over, adult: Secondary | ICD-10-CM | POA: Diagnosis not present

## 2017-06-19 MED ORDER — PHENTERMINE HCL 15 MG PO CAPS: 15.0000 mg | ORAL_CAPSULE | ORAL | 0 refills | Status: DC

## 2017-06-19 NOTE — Patient Instructions (Signed)

## 2017-06-19 NOTE — Progress Notes (Signed)
Patient: Michelle Pitts Female    DOB: 09/01/1950   66 y.o.   MRN: [id number] Visit Date: 06/19/2017  Today's Provider: Mar Daring, PA-C   Chief Complaint  Patient presents with  . Follow-up    Weight   Subjective:    HPI   Weight, Follow up:  The patient was last seen for weight loss counseling 4 weeks ago. Changes made since that visit include Phentermine 15mg . Amb ref to Medical Nutrition Therapy.  She reports good compliance with treatment. Patient reports that she doesn't take the Phentermine everyday. She is not having side effects.  Patient is not exercising. Patient was seen on 06/02/17 by Nutrition/Diabetes at [facility]. ------------------------------------------------------------------------    Allergies  Allergen Reactions  . Codeine Nausea And Vomiting  . Hydrochlorothiazide Swelling    Swelling of tongue.  Mack Hook [Levofloxacin In D5w] Hives and Swelling    Leg swelling Leg swelling     Current Outpatient Medications:  .  albuterol (PROAIR HFA) 108 (90 Base) MCG/ACT inhaler, Inhale 2 puffs into the lungs every 6 (six) hours as needed for wheezing or shortness of breath., Disp: 18 g, Rfl: 3 .  aspirin EC 81 MG tablet, Take 81 mg by mouth daily., Disp: , Rfl:  .  cholecalciferol (VITAMIN D) 1000 units tablet, Take 1 tablet (1,000 Units total) by mouth daily., Disp: 30 tablet, Rfl: 11 .  metoprolol succinate (TOPROL-XL) 25 MG 24 hr tablet, Take 1 tablet (25 mg total) by mouth daily., Disp: 90 tablet, Rfl: 1 .  phentermine 15 MG capsule, Take 1 capsule (15 mg total) by mouth every morning., Disp: 30 capsule, Rfl: 0 .  ALPRAZolam (XANAX) 0.5 MG tablet, Take 1 tablet (0.5 mg total) by mouth 2 (two) times daily. As needed. (Patient not taking: Reported on 06/19/2017), Disp: 60 tablet, Rfl: 0 .  Fluticasone-Salmeterol (ADVAIR) 250-50 MCG/DOSE AEPB, Inhale 1 puff into the lungs 2 (two) times daily., Disp: , Rfl:   Current  Facility-Administered Medications:  .  levalbuterol (XOPENEX) nebulizer solution 1.25 mg, 1.25 mg, Nebulization, Once, Mar Daring, PA-C  Review of Systems  Constitutional: Negative.   Respiratory: Negative.   Cardiovascular: Negative.   Gastrointestinal: Negative.   Neurological: Negative.   Psychiatric/Behavioral: Negative.     Social History   Tobacco Use  . Smoking status: Former Smoker    Packs/day: 1.00    Years: 50.00    Pack years: 50.00    Types: Cigarettes    Last attempt to quit: 09/15/2014    Years since quitting: 2.7  . Smokeless tobacco: Never Used  Substance Use Topics  . Alcohol use: Yes    Alcohol/week: 8.4 oz    Types: 14 Cans of beer per week   Objective:   BP 140/86 (BP Location: Right Arm, Patient Position: Sitting, Cuff Size: Large)   Pulse (!) 101   Temp 98.3 F (36.8 C) (Oral)   Resp 16   Ht 5\' 4"  (1.626 m)   Wt 245 lb 9.6 oz (111.4 kg)   BMI 42.16 kg/m    Physical Exam  Constitutional: She appears well-developed and well-nourished. No distress.  Neck: Normal range of motion. Neck supple.  Cardiovascular: Normal rate, regular rhythm and normal heart sounds. Exam reveals no gallop and no friction rub.  No murmur heard. Pulmonary/Chest: Effort normal and breath sounds normal. No respiratory distress. She has no wheezes. She has no rales.  Skin: She is not diaphoretic.  Psychiatric: She  has a normal mood and affect. Her behavior is normal. Thought content normal.  Vitals reviewed.       Assessment & Plan:     1. Encounter for weight loss counseling Patient did attend nutrition counseling. She has been doing well and has lost about 4 pounds with phentermine. This is on goal with 1-2 pounds per week. Discussed changing eating habits to having a larger meal in the morning and having a small meal/shake at supper time. She voices understanding. I will see her back in 1 month to see how she continues to progress with her weight loss  journey.  - phentermine 15 MG capsule; Take 1 capsule (15 mg total) every morning by mouth.  Dispense: 30 capsule; Refill: 0  2. Class 3 severe obesity due to excess calories with serious comorbidity and body mass index (BMI) of 40.0 to 44.9 in adult [facility]) See above medical treatment plan. - phentermine 15 MG capsule; Take 1 capsule (15 mg total) every morning by mouth.  Dispense: 30 capsule; Refill: 0       Mar Daring, PA-C  [facility]

## 2017-08-02 ENCOUNTER — Ambulatory Visit: Payer: Medicare Other | Admitting: Physician Assistant

## 2017-08-02 ENCOUNTER — Encounter: Payer: Self-pay | Admitting: Physician Assistant

## 2017-08-02 VITALS — BP 146/86 | HR 88 | Temp 98.0°F | Resp 16 | Ht 64.0 in | Wt 241.0 lb

## 2017-08-02 DIAGNOSIS — Z6841 Body Mass Index (BMI) 40.0 and over, adult: Secondary | ICD-10-CM

## 2017-08-02 DIAGNOSIS — J449 Chronic obstructive pulmonary disease, unspecified: Secondary | ICD-10-CM

## 2017-08-02 DIAGNOSIS — Z713 Dietary counseling and surveillance: Secondary | ICD-10-CM

## 2017-08-02 DIAGNOSIS — I10 Essential (primary) hypertension: Secondary | ICD-10-CM | POA: Diagnosis not present

## 2017-08-02 MED ORDER — PHENTERMINE HCL 15 MG PO CAPS: 15.0000 mg | ORAL_CAPSULE | ORAL | 2 refills | Status: DC

## 2017-08-02 MED ORDER — FLUTICASONE-SALMETEROL 250-50 MCG/DOSE IN AEPB: 1.0000 | INHALATION_SPRAY | Freq: Two times a day (BID) | RESPIRATORY_TRACT | 5 refills | Status: DC

## 2017-08-02 NOTE — Progress Notes (Signed)
Patient: Michelle Pitts Female    DOB: 08-21-1950   66 y.o.   MRN: [id number] Visit Date: 08/02/2017  Today's Provider: Mar Daring, PA-C   Chief Complaint  Patient presents with  . Hypertension  . COPD  . Follow-up   Subjective:    HPI  Hypertension, follow-up:  BP Readings from Last 3 Encounters:  08/02/17 (!) 146/96  06/19/17 140/86  05/17/17 130/80    She was last seen for hypertension 6 months ago.  BP at that visit was 138/88. Management changes since that visit include no changes. She reports excellent compliance with treatment. She is not having side effects.  She is not exercising. She is not adherent to low salt diet.   Outside blood pressures are not being checked. She is experiencing shortness of breath.  Patient denies chest pain.   Cardiovascular risk factors include advanced age (older than 24 for men, 34 for women), hypertension and obesity (BMI >= 30 kg/m2).  Use of agents associated with hypertension: none.     Weight trend: decreasing steadily Wt Readings from Last 3 Encounters:  08/02/17 241 lb (109.3 kg)  06/19/17 245 lb 9.6 oz (111.4 kg)  06/02/17 245 lb 3.2 oz (111.2 kg)    Current diet: in general, an "unhealthy" diet  ------------------------------------------------------------------------   Follow up for COPD  The patient was last seen for this 6 months ago. Changes made at last visit include no changes.  She reports poor compliance with treatment. She feels that condition is Unchanged. Patient reports not using Advair daily. She is not having side effects.   ------------------------------------------------------------------------------------   Follow up for weight  The patient was last seen for this 1 months ago. Changes made at last visit include start phentermine.  She reports excellent compliance with treatment. She feels that condition is Unchanged. She is not having side effects.  Patient  reports she did not make any diet changes and has not been able to exercise.  Wt Readings from Last 3 Encounters:  08/02/17 241 lb (109.3 kg)  06/19/17 245 lb 9.6 oz (111.4 kg)  06/02/17 245 lb 3.2 oz (111.2 kg)    ------------------------------------------------------------------------------------     Allergies  Allergen Reactions  . Codeine Nausea And Vomiting  . Hydrochlorothiazide Swelling    Swelling of tongue.  Mack Hook [Levofloxacin In D5w] Hives and Swelling    Leg swelling Leg swelling     Current Outpatient Medications:  .  albuterol (PROAIR HFA) 108 (90 Base) MCG/ACT inhaler, Inhale 2 puffs into the lungs every 6 (six) hours as needed for wheezing or shortness of breath., Disp: 18 g, Rfl: 3 .  ALPRAZolam (XANAX) 0.5 MG tablet, Take 1 tablet (0.5 mg total) by mouth 2 (two) times daily. As needed., Disp: 60 tablet, Rfl: 0 .  aspirin EC 81 MG tablet, Take 81 mg by mouth daily., Disp: , Rfl:  .  cholecalciferol (VITAMIN D) 1000 units tablet, Take 1 tablet (1,000 Units total) by mouth daily., Disp: 30 tablet, Rfl: 11 .  Fluticasone-Salmeterol (ADVAIR) 250-50 MCG/DOSE AEPB, Inhale 1 puff into the lungs 2 (two) times daily., Disp: , Rfl:  .  metoprolol succinate (TOPROL-XL) 25 MG 24 hr tablet, Take 1 tablet (25 mg total) by mouth daily., Disp: 90 tablet, Rfl: 1 .  phentermine 15 MG capsule, Take 1 capsule (15 mg total) every morning by mouth., Disp: 30 capsule, Rfl: 0  Review of Systems  Constitutional: Negative.   HENT: Negative.  Respiratory: Positive for shortness of breath (feels it is due to excess weight). Negative for cough, chest tightness and wheezing.   Cardiovascular: Negative.   Gastrointestinal: Negative.   Neurological: Negative.     Social History   Tobacco Use  . Smoking status: Former Smoker    Packs/day: 1.00    Years: 50.00    Pack years: 50.00    Types: Cigarettes    Last attempt to quit: 09/15/2014    Years since quitting: 2.8  . Smokeless  tobacco: Never Used  Substance Use Topics  . Alcohol use: Yes    Alcohol/week: 8.4 oz    Types: 14 Cans of beer per week   Objective:   BP (!) 146/96 (BP Location: Left Arm, Patient Position: Sitting, Cuff Size: Large)   Temp 98 F (36.7 C) (Oral)   Resp 16   Ht 5\' 4"  (1.626 m)   Wt 241 lb (109.3 kg)   BMI 41.37 kg/m  Vitals:   08/02/17 1346  BP: (!) 146/96  Resp: 16  Temp: 98 F (36.7 C)  TempSrc: Oral  Weight: 241 lb (109.3 kg)  Height: 5\' 4"  (1.626 m)     Physical Exam  Constitutional: She appears well-developed and well-nourished. No distress.  Neck: Normal range of motion. Neck supple. No tracheal deviation present. No thyromegaly present.  Cardiovascular: Normal rate, regular rhythm and normal heart sounds. Exam reveals no gallop and no friction rub.  No murmur heard. Pulmonary/Chest: Effort normal and breath sounds normal. No respiratory distress. She has no wheezes. She has no rales.  Lymphadenopathy:    She has no cervical adenopathy.  Skin: She is not diaphoretic.  Vitals reviewed.      Assessment & Plan:     1. Essential hypertension Elevated today. Continue Metoprolol 25mg . She is to send in BP readings from home through [facility]. If elevated will add furosemide vs amlodipine.   2. CAFL (chronic airflow limitation) (HCC) Stable. Diagnosis pulled for medication refill. Continue current medical treatment plan. - Fluticasone-Salmeterol (ADVAIR) 250-50 MCG/DOSE AEPB; Inhale 1 puff into the lungs 2 (two) times daily.  Dispense: 60 each; Refill: 5  3. Class 3 severe obesity due to excess calories with serious comorbidity and body mass index (BMI) of 40.0 to 44.9 in adult [facility]) Continue phentermine x 3 more months. I will see her back in 3 months to recheck weight and BP. - phentermine 15 MG capsule; Take 1 capsule (15 mg total) by mouth every morning.  Dispense: 30 capsule; Refill: 2  4. Encounter for weight loss counseling See above medical treatment  plan. - phentermine 15 MG capsule; Take 1 capsule (15 mg total) by mouth every morning.  Dispense: 30 capsule; Refill: [place], PA-C  [facility]

## 2017-08-02 NOTE — Patient Instructions (Signed)
DASH Eating Plan DASH stands for "Dietary Approaches to Stop Hypertension." The DASH eating plan is a healthy eating plan that has been shown to reduce high blood pressure (hypertension). It may also reduce your risk for type 2 diabetes, heart disease, and stroke. The DASH eating plan may also help with weight loss. What are tips for following this plan? General guidelines  Avoid eating more than 2,300 mg (milligrams) of salt (sodium) a day. If you have hypertension, you may need to reduce your sodium intake to 1,500 mg a day.  Limit alcohol intake to no more than 1 drink a day for nonpregnant women and 2 drinks a day for men. One drink equals 12 oz of beer, 5 oz of wine, or 1 oz of hard liquor.  Work with your health care provider to maintain a healthy body weight or to lose weight. Ask what an ideal weight is for you.  Get at least 30 minutes of exercise that causes your heart to beat faster (aerobic exercise) most days of the week. Activities may include walking, swimming, or biking.  Work with your health care provider or diet and nutrition specialist (dietitian) to adjust your eating plan to your individual calorie needs. Reading food labels  Check food labels for the amount of sodium per serving. Choose foods with less than 5 percent of the Daily Value of sodium. Generally, foods with less than 300 mg of sodium per serving fit into this eating plan.  To find whole grains, look for the word "whole" as the first word in the ingredient list. Shopping  Buy products labeled as "low-sodium" or "no salt added."  Buy fresh foods. Avoid canned foods and premade or frozen meals. Cooking  Avoid adding salt when cooking. Use salt-free seasonings or herbs instead of table salt or sea salt. Check with your health care provider or pharmacist before using salt substitutes.  Do not fry foods. Cook foods using healthy methods such as baking, boiling, grilling, and broiling instead.  Cook with  heart-healthy oils, such as olive, canola, soybean, or sunflower oil. Meal planning   Eat a balanced diet that includes: ? 5 or more servings of fruits and vegetables each day. At each meal, try to fill half of your plate with fruits and vegetables. ? Up to 6-8 servings of whole grains each day. ? Less than 6 oz of lean meat, poultry, or fish each day. A 3-oz serving of meat is about the same size as a deck of cards. One egg equals 1 oz. ? 2 servings of low-fat dairy each day. ? A serving of nuts, seeds, or beans 5 times each week. ? Heart-healthy fats. Healthy fats called Omega-3 fatty acids are found in foods such as flaxseeds and coldwater fish, like sardines, salmon, and mackerel.  Limit how much you eat of the following: ? Canned or prepackaged foods. ? Food that is high in trans fat, such as fried foods. ? Food that is high in saturated fat, such as fatty meat. ? Sweets, desserts, sugary drinks, and other foods with added sugar. ? Full-fat dairy products.  Do not salt foods before eating.  Try to eat at least 2 vegetarian meals each week.  Eat more home-cooked food and less restaurant, buffet, and fast food.  When eating at a restaurant, ask that your food be prepared with less salt or no salt, if possible. What foods are recommended? The items listed may not be a complete list. Talk with your dietitian about what   dietary choices are best for you. Grains Whole-grain or whole-wheat bread. Whole-grain or whole-wheat pasta. Brown rice. Oatmeal. Quinoa. Bulgur. Whole-grain and low-sodium cereals. Pita bread. Low-fat, low-sodium crackers. Whole-wheat flour tortillas. Vegetables Fresh or frozen vegetables (raw, steamed, roasted, or grilled). Low-sodium or reduced-sodium tomato and vegetable juice. Low-sodium or reduced-sodium tomato sauce and tomato paste. Low-sodium or reduced-sodium canned vegetables. Fruits All fresh, dried, or frozen fruit. Canned fruit in natural juice (without  added sugar). Meat and other protein foods Skinless chicken or turkey. Ground chicken or turkey. Pork with fat trimmed off. Fish and seafood. Egg whites. Dried beans, peas, or lentils. Unsalted nuts, nut butters, and seeds. Unsalted canned beans. Lean cuts of beef with fat trimmed off. Low-sodium, lean deli meat. Dairy Low-fat (1%) or fat-free (skim) milk. Fat-free, low-fat, or reduced-fat cheeses. Nonfat, low-sodium ricotta or cottage cheese. Low-fat or nonfat yogurt. Low-fat, low-sodium cheese. Fats and oils Soft margarine without trans fats. Vegetable oil. Low-fat, reduced-fat, or light mayonnaise and salad dressings (reduced-sodium). Canola, safflower, olive, soybean, and sunflower oils. Avocado. Seasoning and other foods Herbs. Spices. Seasoning mixes without salt. Unsalted popcorn and pretzels. Fat-free sweets. What foods are not recommended? The items listed may not be a complete list. Talk with your dietitian about what dietary choices are best for you. Grains Baked goods made with fat, such as croissants, muffins, or some breads. Dry pasta or rice meal packs. Vegetables Creamed or fried vegetables. Vegetables in a cheese sauce. Regular canned vegetables (not low-sodium or reduced-sodium). Regular canned tomato sauce and paste (not low-sodium or reduced-sodium). Regular tomato and vegetable juice (not low-sodium or reduced-sodium). Pickles. Olives. Fruits Canned fruit in a light or heavy syrup. Fried fruit. Fruit in cream or butter sauce. Meat and other protein foods Fatty cuts of meat. Ribs. Fried meat. Bacon. Sausage. Bologna and other processed lunch meats. Salami. Fatback. Hotdogs. Bratwurst. Salted nuts and seeds. Canned beans with added salt. Canned or smoked fish. Whole eggs or egg yolks. Chicken or turkey with skin. Dairy Whole or 2% milk, cream, and half-and-half. Whole or full-fat cream cheese. Whole-fat or sweetened yogurt. Full-fat cheese. Nondairy creamers. Whipped toppings.  Processed cheese and cheese spreads. Fats and oils Butter. Stick margarine. Lard. Shortening. Ghee. Bacon fat. Tropical oils, such as coconut, palm kernel, or palm oil. Seasoning and other foods Salted popcorn and pretzels. Onion salt, garlic salt, seasoned salt, table salt, and sea salt. Worcestershire sauce. Tartar sauce. Barbecue sauce. Teriyaki sauce. Soy sauce, including reduced-sodium. Steak sauce. Canned and packaged gravies. Fish sauce. Oyster sauce. Cocktail sauce. Horseradish that you find on the shelf. Ketchup. Mustard. Meat flavorings and tenderizers. Bouillon cubes. Hot sauce and Tabasco sauce. Premade or packaged marinades. Premade or packaged taco seasonings. Relishes. Regular salad dressings. Where to find more information:  National Heart, Lung, and Blood Institute: www.nhlbi.nih.gov  American Heart Association: www.heart.org Summary  The DASH eating plan is a healthy eating plan that has been shown to reduce high blood pressure (hypertension). It may also reduce your risk for type 2 diabetes, heart disease, and stroke.  With the DASH eating plan, you should limit salt (sodium) intake to 2,300 mg a day. If you have hypertension, you may need to reduce your sodium intake to 1,500 mg a day.  When on the DASH eating plan, aim to eat more fresh fruits and vegetables, whole grains, lean proteins, low-fat dairy, and heart-healthy fats.  Work with your health care provider or diet and nutrition specialist (dietitian) to adjust your eating plan to your individual   calorie needs. This information is not intended to replace advice given to you by your health care provider. Make sure you discuss any questions you have with your health care provider. Document Released: 07/21/2011 Document Revised: 07/25/2016 Document Reviewed: 07/25/2016 Elsevier Interactive Patient Education  2018 Elsevier Inc.  

## 2017-09-18 ENCOUNTER — Other Ambulatory Visit: Payer: Self-pay | Admitting: Physician Assistant

## 2017-09-18 DIAGNOSIS — I10 Essential (primary) hypertension: Secondary | ICD-10-CM

## 2017-09-18 MED ORDER — METOPROLOL SUCCINATE ER 25 MG PO TB24: 25.0000 mg | ORAL_TABLET | Freq: Every day | ORAL | 1 refills | Status: DC

## 2017-09-18 NOTE — Telephone Encounter (Signed)
Please review. Thanks!  

## 2017-09-18 NOTE — Telephone Encounter (Signed)
[facility] pharmacy faxed a refill request for a 90-days supply for the following medication. Thanks CC  metoprolol succinate (TOPROL-XL) 25 MG 24 hr tablet

## 2017-09-24 ENCOUNTER — Encounter: Payer: Self-pay | Admitting: Physician Assistant

## 2017-09-25 ENCOUNTER — Encounter: Payer: Self-pay | Admitting: General Surgery

## 2017-09-26 NOTE — Telephone Encounter (Signed)
Appointment made with orthopedics Dr Mack Guise on 09-27-17 at 3:00, pt aware and agrees.

## 2017-09-29 ENCOUNTER — Other Ambulatory Visit: Payer: Self-pay | Admitting: Orthopedic Surgery

## 2017-09-29 DIAGNOSIS — M5416 Radiculopathy, lumbar region: Secondary | ICD-10-CM

## 2017-10-03 ENCOUNTER — Encounter: Payer: Self-pay | Admitting: Physician Assistant

## 2017-10-03 DIAGNOSIS — I10 Essential (primary) hypertension: Secondary | ICD-10-CM

## 2017-10-04 MED ORDER — METOPROLOL SUCCINATE ER 25 MG PO TB24: 25.0000 mg | ORAL_TABLET | Freq: Every day | ORAL | 1 refills | Status: DC

## 2017-10-09 ENCOUNTER — Ambulatory Visit
Admission: RE | Admit: 2017-10-09 | Discharge: 2017-10-09 | Disposition: A | Discharge status: Home / Self Care | Payer: Medicare Other | Source: Ambulatory Visit | Attending: Orthopedic Surgery | Admitting: Orthopedic Surgery

## 2017-10-09 DIAGNOSIS — M48061 Spinal stenosis, lumbar region without neurogenic claudication: Secondary | ICD-10-CM | POA: Diagnosis not present

## 2017-10-09 DIAGNOSIS — M5416 Radiculopathy, lumbar region: Secondary | ICD-10-CM | POA: Insufficient documentation

## 2017-10-09 DIAGNOSIS — Q057 Lumbar spina bifida without hydrocephalus: Secondary | ICD-10-CM | POA: Insufficient documentation

## 2017-10-09 DIAGNOSIS — M8938 Hypertrophy of bone, other site: Secondary | ICD-10-CM | POA: Insufficient documentation

## 2017-11-01 ENCOUNTER — Ambulatory Visit: Payer: Self-pay | Admitting: Physician Assistant

## 2017-11-10 DIAGNOSIS — R200 Anesthesia of skin: Secondary | ICD-10-CM | POA: Insufficient documentation

## 2017-11-23 ENCOUNTER — Other Ambulatory Visit: Payer: Self-pay

## 2017-11-23 DIAGNOSIS — Z171 Estrogen receptor negative status [ER-]: Principal | ICD-10-CM

## 2017-11-23 DIAGNOSIS — C50312 Malignant neoplasm of lower-inner quadrant of left female breast: Secondary | ICD-10-CM

## 2017-11-29 ENCOUNTER — Telehealth: Payer: Self-pay | Admitting: *Deleted

## 2017-11-29 DIAGNOSIS — Z87891 Personal history of nicotine dependence: Secondary | ICD-10-CM

## 2017-11-29 DIAGNOSIS — Z122 Encounter for screening for malignant neoplasm of respiratory organs: Secondary | ICD-10-CM

## 2017-11-29 NOTE — Telephone Encounter (Signed)
Notified patient that annual lung cancer screening low dose CT scan is due currently or will be in near future. Confirmed that patient is within the age range of 55-77, and asymptomatic, (no signs or symptoms of lung cancer). Patient denies illness that would prevent curative treatment for lung cancer if found. Verified smoking history, (former, quit 2016. 30 pack year). The shared decision making visit was done 07/23/14. Patient is agreeable for CT scan being scheduled.

## 2017-11-30 ENCOUNTER — Encounter: Payer: Self-pay | Admitting: Physician Assistant

## 2017-11-30 ENCOUNTER — Ambulatory Visit: Payer: Medicare Other | Admitting: Physician Assistant

## 2017-11-30 VITALS — BP 122/72 | HR 86 | Temp 98.1°F | Resp 16 | Wt 242.0 lb

## 2017-11-30 DIAGNOSIS — R7303 Prediabetes: Secondary | ICD-10-CM | POA: Diagnosis not present

## 2017-11-30 DIAGNOSIS — F5101 Primary insomnia: Secondary | ICD-10-CM

## 2017-11-30 MED ORDER — ALPRAZOLAM 0.5 MG PO TABS: 0.5000 mg | ORAL_TABLET | Freq: Two times a day (BID) | ORAL | 0 refills | Status: DC

## 2017-11-30 MED ORDER — GLUCOSE BLOOD VI STRP: ORAL_STRIP | 12 refills | Status: DC

## 2017-11-30 MED ORDER — ONETOUCH VERIO W/DEVICE KIT: 1.0000 | PACK | Freq: Every day | 0 refills | Status: DC

## 2017-11-30 MED ORDER — ONETOUCH ULTRASOFT LANCETS MISC: 12 refills | Status: DC

## 2017-11-30 NOTE — Progress Notes (Signed)
Patient: Michelle Pitts Female    DOB: 21-Jan-1951   67 y.o.   MRN: [id number] Visit Date: 11/30/2017  Today's Provider: Mar Daring, PA-C   Chief Complaint  Patient presents with  . Hyperglycemia   Subjective:    HPI  Hyperglycemia, Follow-up:   Lab Results  Component Value Date   HGBA1C 5.4 02/10/2017   HGBA1C 5.3 02/08/2016   GLUCOSE 122 (H) 02/10/2017   GLUCOSE 97 02/08/2016   GLUCOSE 142 (H) 10/27/2015    Last seen for for this 4 months ago.  Management since then includes no changes. Current symptoms include none and have been stable.  Weight trend: stable Prior visit with dietician: no Current diet: well balanced Current exercise: no regular exercise  Pertinent Labs:    Component Value Date/Time   CHOL 227 (H) 02/10/2017 1004   TRIG 122 02/10/2017 1004   CHOLHDL 2.9 02/10/2017 1004   CREATININE 0.84 02/10/2017 1004   CREATININE 0.96 07/14/2014 1500    Wt Readings from Last 3 Encounters:  11/30/17 242 lb (109.8 kg)  08/02/17 241 lb (109.3 kg)  06/19/17 245 lb 9.6 oz (111.4 kg)     Patient reports that she had a 2-hr glucose test done at Dr. Trena Platt office and was found to have a fasting glucose of 108, glucose of 262 1 hr after, and 138 at the 2 hr mark. She reports that she is still having dizzy spells and severe somnolence that occurs right after eating. She reports she frequently only has one meal daily. This meal most often consists of carbohydrates (she likes pasta, rice, potatoes).  A1c at their office was 5.8, she thinks. Previous A1c here was 5.4.   Allergies  Allergen Reactions  . Codeine Nausea And Vomiting  . Hydrochlorothiazide Swelling    Swelling of tongue.  Mack Hook [Levofloxacin In D5w] Hives and Swelling    Leg swelling Leg swelling     Current Outpatient Medications:  .  albuterol (PROAIR HFA) 108 (90 Base) MCG/ACT inhaler, Inhale 2 puffs into the lungs every 6 (six) hours as needed for wheezing or shortness  of breath., Disp: 18 g, Rfl: 3 .  ALPRAZolam (XANAX) 0.5 MG tablet, Take 1 tablet (0.5 mg total) by mouth 2 (two) times daily. As needed., Disp: 60 tablet, Rfl: 0 .  aspirin EC 81 MG tablet, Take 81 mg by mouth daily., Disp: , Rfl:  .  cholecalciferol (VITAMIN D) 1000 units tablet, Take 1 tablet (1,000 Units total) by mouth daily., Disp: 30 tablet, Rfl: 11 .  Fluticasone-Salmeterol (ADVAIR) 250-50 MCG/DOSE AEPB, Inhale 1 puff into the lungs 2 (two) times daily., Disp: 60 each, Rfl: 5 .  metoprolol succinate (TOPROL-XL) 25 MG 24 hr tablet, Take 1 tablet (25 mg total) by mouth daily., Disp: 90 tablet, Rfl: 1 .  phentermine 15 MG capsule, Take 1 capsule (15 mg total) by mouth every morning., Disp: 30 capsule, Rfl: 2  Review of Systems  Constitutional: Positive for activity change and fatigue.  HENT: Negative for congestion.   Respiratory: Negative for shortness of breath and wheezing.   Cardiovascular: Negative for chest pain, palpitations and leg swelling.  Skin: Negative for color change, pallor, rash and wound.  Neurological: Positive for dizziness, light-headedness and headaches.    Social History   Tobacco Use  . Smoking status: Former Smoker    Packs/day: 1.00    Years: 50.00    Pack years: 50.00    Types: Cigarettes  Last attempt to quit: 09/15/2014    Years since quitting: 3.2  . Smokeless tobacco: Never Used  Substance Use Topics  . Alcohol use: Yes    Alcohol/week: 8.4 oz    Types: 14 Cans of beer per week   Objective:   BP 122/72 (BP Location: Left Arm, Patient Position: Sitting, Cuff Size: Large)   Pulse 86   Temp 98.1 F (36.7 C)   Resp 16   Wt 242 lb (109.8 kg)   SpO2 98%   BMI 41.54 kg/m  Vitals:   11/30/17 1324  BP: 122/72  Pulse: 86  Resp: 16  Temp: 98.1 F (36.7 C)  SpO2: 98%  Weight: 242 lb (109.8 kg)     Physical Exam  Constitutional: She appears well-developed and well-nourished. No distress.  Neck: Normal range of motion. Neck supple.    Cardiovascular: Normal rate, regular rhythm and normal heart sounds. Exam reveals no gallop and no friction rub.  No murmur heard. Pulmonary/Chest: Effort normal and breath sounds normal. No respiratory distress. She has no wheezes. She has no rales.  Skin: She is not diaphoretic.  Vitals reviewed.      Assessment & Plan:     1. Prediabetes Will have her check blood glucose fasting and postpradial x 2-3 weeks and I will see her back to recheck. Discussed eating more smaller, frequent meals to decrease the influx of sugar to her body which may be causing the increased somnolence she gets after eating. Also discussed increasing physical activity with water aerobics due to back issues and L5 radiculopathy/neuropathy. She voices understanding. I will see her back in 2-3 weeks.  - Blood Glucose Monitoring Suppl (ONETOUCH VERIO) w/Device KIT; 1 kit by Does not apply route daily. To check BS daily (fasting and postprandial alternating)  Dispense: 1 kit; Refill: 0 - glucose blood (ONETOUCH VERIO) test strip; To check BS daily (fasting and postprandial alternating)  Dispense: 100 each; Refill: 12 - Lancets (ONETOUCH ULTRASOFT) lancets; To check BS daily (fasting and postprandial alternating)  Dispense: 100 each; Refill: 12  2. Primary insomnia Stable. Diagnosis pulled for medication refill. Continue current medical treatment plan. - ALPRAZolam (XANAX) 0.5 MG tablet; Take 1 tablet (0.5 mg total) by mouth 2 (two) times daily. As needed.  Dispense: 60 tablet; Refill: 0       Jennifer M Burnette, PA-C  [facility]  

## 2017-11-30 NOTE — Patient Instructions (Signed)
Prediabetes Eating Plan Prediabetes-also called impaired glucose tolerance or impaired fasting glucose-is a condition that causes blood sugar (blood glucose) levels to be higher than normal. Following a healthy diet can help to keep prediabetes under control. It can also help to lower the risk of type 2 diabetes and heart disease, which are increased in people who have prediabetes. Along with regular exercise, a healthy diet:  Promotes weight loss.  Helps to control blood sugar levels.  Helps to improve the way that the body uses insulin.  What do I need to know about this eating plan?  Use the glycemic index (GI) to plan your meals. The index tells you how quickly a food will raise your blood sugar. Choose low-GI foods. These foods take a longer time to raise blood sugar.  Pay close attention to the amount of carbohydrates in the food that you eat. Carbohydrates increase blood sugar levels.  Keep track of how many calories you take in. Eating the right amount of calories will help you to achieve a healthy weight. Losing about 7 percent of your starting weight can help to prevent type 2 diabetes.  You may want to follow a Mediterranean diet. This diet includes a lot of vegetables, lean meats or fish, whole grains, fruits, and healthy oils and fats. What foods can I eat? Grains Whole grains, such as whole-wheat or whole-grain breads, crackers, cereals, and pasta. Unsweetened oatmeal. Bulgur. Barley. Quinoa. Brown rice. Corn or whole-wheat flour tortillas or taco shells. Vegetables Lettuce. Spinach. Peas. Beets. Cauliflower. Cabbage. Broccoli. Carrots. Tomatoes. Squash. Eggplant. Herbs. Peppers. Onions. Cucumbers. Brussels sprouts. Fruits Berries. Bananas. Apples. Oranges. Grapes. Papaya. Mango. Pomegranate. Kiwi. Grapefruit. Cherries. Meats and Other Protein Sources Seafood. Lean meats, such as chicken and [place] or lean cuts of pork and beef. Tofu. Eggs. Nuts. Beans. Dairy Low-fat or  fat-free dairy products, such as yogurt, cottage cheese, and cheese. Beverages Water. Tea. Coffee. Sugar-free or diet soda. Seltzer water. Milk. Milk alternatives, such as soy or almond milk. Condiments Mustard. Relish. Low-fat, low-sugar ketchup. Low-fat, low-sugar barbecue sauce. Low-fat or fat-free mayonnaise. Sweets and Desserts Sugar-free or low-fat pudding. Sugar-free or low-fat ice cream and other frozen treats. Fats and Oils Avocado. Walnuts. Olive oil. The items listed above may not be a complete list of recommended foods or beverages. Contact your dietitian for more options. What foods are not recommended? Grains Refined white flour and flour products, such as bread, pasta, snack foods, and cereals. Beverages Sweetened drinks, such as sweet iced tea and soda. Sweets and Desserts Baked goods, such as cake, cupcakes, pastries, cookies, and cheesecake. The items listed above may not be a complete list of foods and beverages to avoid. Contact your dietitian for more information. This information is not intended to replace advice given to you by your health care provider. Make sure you discuss any questions you have with your health care provider. Document Released: 12/16/2014 Document Revised: 01/07/2016 Document Reviewed: 08/27/2014 Elsevier Interactive Patient Education  2017 Elsevier Inc.   Prediabetes Prediabetes is the condition of having a blood sugar (blood glucose) level that is higher than it should be, but not high enough for you to be diagnosed with type 2 diabetes. Having prediabetes puts you at risk for developing type 2 diabetes (type 2 diabetes mellitus). Prediabetes may be called impaired glucose tolerance or impaired fasting glucose. Prediabetes usually does not cause symptoms. Your health care provider can diagnose this condition with blood tests. You may be tested for prediabetes if you are overweight  and if you have at least one other risk factor for  prediabetes. Risk factors for prediabetes include:  Having a family member with type 2 diabetes.  Being overweight or obese.  Being older than age 21.  Being of American-Indian, African-American, Hispanic/Latino, or Asian/Pacific Islander descent.  Having an inactive (sedentary) lifestyle.  Having a history of gestational diabetes or polycystic ovarian syndrome (PCOS).  Having low levels of good cholesterol (HDL-C) or high levels of blood fats (triglycerides).  Having high blood pressure.  What is blood glucose and how is blood glucose measured?  Blood glucose refers to the amount of glucose in your bloodstream. Glucose comes from eating foods that contain sugars and starches (carbohydrates) that the body breaks down into glucose. Your blood glucose level may be measured in mg/dL (milligrams per deciliter) or mmol/L (millimoles per liter).Your blood glucose may be checked with one or more of the following blood tests:  A fasting blood glucose (FBG) test. You will not be allowed to eat (you will fast) for at least 8 hours before a blood sample is taken. ? A normal range for FBG is 70-100 mg/dl (3.9-5.6 mmol/L).  An A1c (hemoglobin A1c) blood test. This test provides information about blood glucose control over the previous 2?72months.  An oral glucose tolerance test (OGTT). This test measures your blood glucose twice: ? After fasting. This is your baseline level. ? Two hours after you drink a beverage that contains glucose.  You may be diagnosed with prediabetes:  If your FBG is 100?125 mg/dL (5.6-6.9 mmol/L).  If your A1c level is 5.7?6.4%.  If your OGGT result is 140?199 mg/dL (7.8-11 mmol/L).  These blood tests may be repeated to confirm your diagnosis. What happens if blood glucose is too high? The pancreas produces a hormone (insulin) that helps move glucose from the bloodstream into cells. When cells in the body do not respond properly to insulin that the body makes  (insulin resistance), excess glucose builds up in the blood instead of going into cells. As a result, high blood glucose (hyperglycemia) can develop, which can cause many complications. This is a symptom of prediabetes. What can happen if blood glucose stays higher than normal for a long time? Having high blood glucose for a long time is dangerous. Too much glucose in your blood can damage your nerves and blood vessels. Long-term damage can lead to complications from diabetes, which may include:  Heart disease.  Stroke.  Blindness.  Kidney disease.  Depression.  Poor circulation in the feet and legs, which could lead to surgical removal (amputation) in severe cases.  How can prediabetes be prevented from turning into type 2 diabetes?  To help prevent type 2 diabetes, take the following actions:  Be physically active. ? Do moderate-intensity physical activity for at least 30 minutes on at least 5 days of the week, or as much as told by your health care provider. This could be brisk walking, biking, or water aerobics. ? Ask your health care provider what activities are safe for you. A mix of physical activities may be best, such as walking, swimming, cycling, and strength training.  Lose weight as told by your health care provider. ? Losing 5-7% of your body weight can reverse insulin resistance. ? Your health care provider can determine how much weight loss is best for you and can help you lose weight safely.  Follow a healthy meal plan. This includes eating lean proteins, complex carbohydrates, fresh fruits and vegetables, low-fat dairy products, and  healthy fats. ? Follow instructions from your health care provider about eating or drinking restrictions. ? Make an appointment to see a diet and nutrition specialist (registered dietitian) to help you create a healthy eating plan that is right for you.  Do not smoke or use any tobacco products, such as cigarettes, chewing tobacco, and  e-cigarettes. If you need help quitting, ask your health care provider.  Take over-the-counter and prescription medicines as told by your health care provider. You may be prescribed medicines that help lower the risk of type 2 diabetes.  This information is not intended to replace advice given to you by your health care provider. Make sure you discuss any questions you have with your health care provider. Document Released: 11/23/2015 Document Revised: 01/07/2016 Document Reviewed: 09/22/2015 Elsevier Interactive Patient Education  2018 [employer].   Preventing Type 2 Diabetes Mellitus Type 2 diabetes (type 2 diabetes mellitus) is a long-term (chronic) disease that affects blood sugar (glucose) levels. Normally, a hormone called insulin allows glucose to enter cells in the body. The cells use glucose for energy. In type 2 diabetes, one or both of these problems may be present:  The body does not make enough insulin.  The body does not respond properly to insulin that it makes (insulin resistance).  Insulin resistance or lack of insulin causes excess glucose to build up in the blood instead of going into cells. As a result, high blood glucose (hyperglycemia) develops, which can cause many complications. Being overweight or obese and having an inactive (sedentary) lifestyle can increase your risk for diabetes. Type 2 diabetes can be delayed or prevented by making certain nutrition and lifestyle changes. What nutrition changes can be made?  Eat healthy meals and snacks regularly. Keep a healthy snack with you for when you get hungry between meals, such as fruit or a handful of nuts.  Eat lean meats and proteins that are low in saturated fats, such as chicken, fish, egg whites, and beans. Avoid processed meats.  Eat plenty of fruits and vegetables and plenty of grains that have not been processed (whole grains). It is recommended that you eat: ? 1?2 cups of fruit every day. ? 2?3 cups of  vegetables every day. ? 6?8 oz of whole grains every day, such as oats, whole wheat, bulgur, brown rice, quinoa, and millet.  Eat low-fat dairy products, such as milk, yogurt, and cheese.  Eat foods that contain healthy fats, such as nuts, avocado, olive oil, and canola oil.  Drink water throughout the day. Avoid drinks that contain added sugar, such as soda or sweet tea.  Follow instructions from your health care provider about specific eating or drinking restrictions.  Control how much food you eat at a time (portion size). ? Check food labels to find out the serving sizes of foods. ? Use a kitchen scale to weigh amounts of foods.  Saute or steam food instead of frying it. Cook with water or broth instead of oils or butter.  Limit your intake of: ? Salt (sodium). Have no more than 1 tsp (2,400 mg) of sodium a day. If you have heart disease or high blood pressure, have less than ? tsp (1,500 mg) of sodium a day. ? Saturated fat. This is fat that is solid at room temperature, such as butter or fat on meat. What lifestyle changes can be made?  Activity  Do moderate-intensity physical activity for at least 30 minutes on at least 5 days of the week, or as  much as told by your health care provider.  Ask your health care provider what activities are safe for you. A mix of physical activities may be best, such as walking, swimming, cycling, and strength training.  Try to add physical activity into your day. For example: ? Park in spots that are farther away than usual, so that you walk more. For example, park in a far corner of the parking lot when you go to the office or the grocery store. ? Take a walk during your lunch break. ? Use stairs instead of elevators or escalators. Weight Loss  Lose weight as directed. Your health care provider can determine how much weight loss is best for you and can help you lose weight safely.  If you are overweight or obese, you may be instructed to  lose at least 5?7 % of your body weight. Alcohol and Tobacco   Limit alcohol intake to no more than 1 drink a day for nonpregnant women and 2 drinks a day for men. One drink equals 12 oz of beer, 5 oz of wine, or 1 oz of hard liquor.  Do not use any tobacco products, such as cigarettes, chewing tobacco, and e-cigarettes. If you need help quitting, ask your health care provider. Work With [facility] [place] Provider  Have your blood glucose tested regularly, as told by your health care provider.  Discuss your risk factors and how you can reduce your risk for diabetes.  Get screening tests as told by your health care provider. You may have screening tests regularly, especially if you have certain risk factors for type 2 diabetes.  Make an appointment with a diet and nutrition specialist (registered dietitian). A registered dietitian can help you make a healthy eating plan and can help you understand portion sizes and food labels. Why are these changes important?  It is possible to prevent or delay type 2 diabetes and related health problems by making lifestyle and nutrition changes.  It can be difficult to recognize signs of type 2 diabetes. The best way to avoid possible damage to your body is to take actions to prevent the disease before you develop symptoms. What can happen if changes are not made?  Your blood glucose levels may keep increasing. Having high blood glucose for a long time is dangerous. Too much glucose in your blood can damage your blood vessels, heart, kidneys, nerves, and eyes.  You may develop prediabetes or type 2 diabetes. Type 2 diabetes can lead to many chronic health problems and complications, such as: ? Heart disease. ? Stroke. ? Blindness. ? Kidney disease. ? Depression. ? Poor circulation in the feet and legs, which could lead to surgical removal (amputation) in severe cases. Where to find support:  Ask your health care provider to recommend a registered  dietitian, diabetes educator, or weight loss program.  Look for local or online weight loss groups.  Join a gym, fitness club, or outdoor activity group, such as a walking club. Where to find more information: To learn more about diabetes and diabetes prevention, visit:  American Diabetes Association (ADA): www.diabetes.[employer] of Diabetes and Digestive and Kidney Diseases: FindSpin.nl  To learn more about healthy eating, visit:  The U.S. Department of Agriculture Scientist, research (physical sciences)), Choose My Plate: http://wiley-williams.com/  Office of Disease Prevention and Health Promotion (ODPHP), Dietary Guidelines: SurferLive.at  Summary  You can reduce your risk for type 2 diabetes by increasing your physical activity, eating healthy foods, and losing weight as directed.  Talk with your health care provider about your risk for type 2 diabetes. Ask about any blood tests or screening tests that you need to have. This information is not intended to replace advice given to you by your health care provider. Make sure you discuss any questions you have with your health care provider. Document Released: 11/23/2015 Document Revised: 01/07/2016 Document Reviewed: 09/22/2015 Elsevier Interactive Patient Education  [employer].

## 2017-12-06 ENCOUNTER — Ambulatory Visit
Admission: RE | Admit: 2017-12-06 | Discharge: 2017-12-06 | Disposition: A | Discharge status: Home / Self Care | Payer: Medicare Other | Source: Ambulatory Visit | Attending: Oncology | Admitting: Oncology

## 2017-12-06 DIAGNOSIS — I2584 Coronary atherosclerosis due to calcified coronary lesion: Secondary | ICD-10-CM | POA: Insufficient documentation

## 2017-12-06 DIAGNOSIS — Z87891 Personal history of nicotine dependence: Secondary | ICD-10-CM

## 2017-12-06 DIAGNOSIS — K760 Fatty (change of) liver, not elsewhere classified: Secondary | ICD-10-CM | POA: Diagnosis not present

## 2017-12-06 DIAGNOSIS — I700 Atherosclerosis of aorta: Secondary | ICD-10-CM | POA: Diagnosis not present

## 2017-12-06 DIAGNOSIS — Z122 Encounter for screening for malignant neoplasm of respiratory organs: Secondary | ICD-10-CM

## 2017-12-11 ENCOUNTER — Encounter: Payer: Self-pay | Admitting: *Deleted

## 2017-12-18 ENCOUNTER — Encounter: Payer: Self-pay | Admitting: Physician Assistant

## 2017-12-21 ENCOUNTER — Ambulatory Visit: Payer: Self-pay | Admitting: Physician Assistant

## 2018-02-05 ENCOUNTER — Ambulatory Visit
Admission: RE | Admit: 2018-02-05 | Discharge: 2018-02-05 | Disposition: A | Discharge status: Home / Self Care | Payer: Medicare Other | Source: Ambulatory Visit | Attending: General Surgery | Admitting: General Surgery

## 2018-02-05 DIAGNOSIS — Z171 Estrogen receptor negative status [ER-]: Principal | ICD-10-CM

## 2018-02-05 DIAGNOSIS — C50312 Malignant neoplasm of lower-inner quadrant of left female breast: Secondary | ICD-10-CM | POA: Diagnosis present

## 2018-02-05 HISTORY — DX: Personal history of irradiation: Z92.3

## 2018-02-13 ENCOUNTER — Encounter: Payer: Self-pay | Admitting: General Surgery

## 2018-02-13 ENCOUNTER — Ambulatory Visit: Payer: Medicare Other | Admitting: General Surgery

## 2018-02-13 VITALS — BP 142/80 | HR 79 | Resp 14 | Ht 64.0 in | Wt 241.0 lb

## 2018-02-13 DIAGNOSIS — C50312 Malignant neoplasm of lower-inner quadrant of left female breast: Secondary | ICD-10-CM

## 2018-02-13 DIAGNOSIS — Z171 Estrogen receptor negative status [ER-]: Secondary | ICD-10-CM | POA: Diagnosis not present

## 2018-02-13 NOTE — Patient Instructions (Addendum)
The patient has been asked to return to the office in one year with a bilateral diagnostic mammogram.The patient is aware to call back for any questions or concerns. 

## 2018-02-13 NOTE — Progress Notes (Signed)
Patient ID: Michelle Pitts, female   DOB: May 29, 1951, 67 y.o.   MRN: [id number]  Chief Complaint  Patient presents with  . Follow-up    HPI Michelle Pitts is a 67 y.o. female.  who presents for her left breast cancer follow up and a breast evaluation. The most recent mammogram was done on 02-05-18.  Patient does perform regular self breast checks and gets regular mammograms done.     HPI  Past Medical History:  Diagnosis Date  . Anxiety   . Breast cancer ([place]) 01/23/2014   Left breast, T1c, N0, M0. ER/PR +; Her 2 neu not overexpressed. Oncotype DX: 13,Low risk.9% over 10 years.   . Cancer ([place]) 1995   vulvar, [facility]  . COPD (chronic obstructive pulmonary disease) ([facility])    bronchitis  . Dysrhythmia   . GERD (gastroesophageal reflux disease)   . History of colon polyps   . Hypercholesterolemia   . Hypertension   . Hypothyroidism   . Insomnia   . 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 Personal history of radiation therapy   . Personal history of tobacco use, presenting hazards to health 11/18/2015  . Sleep apnea   . SOB (shortness of breath) on exertion   . Thyroid disease   . Vitamin D deficiency     Past Surgical History:  Procedure Laterality Date  . BLADDER SUSPENSION  2001  . BREAST BIOPSY Right 1975, 1985   benign cyst [name]) and fibroadenoma([facility])  . BREAST BIOPSY Right 01/03/2014   Papilloma, no atypia detected on mammography.  Marland Kitchen BREAST BIOPSY Left 01/03/2014   Invasive mammary carcinoma.  Marland Kitchen BREAST BIOPSY Left 01/13/2015   ultrasound giuded biopsy, negative  . BREAST CYST ASPIRATION Left 1985  . BREAST LUMPECTOMY Left 01/23/2014   positive  . BREAST SURGERY Left 01/23/14   wide excision   . BREAST SURGERY  04/10/14   Debridement of fat necrosis  . CHOLECYSTECTOMY    . COLONOSCOPY  2014   Dr Tiffany Kocher  . COLONOSCOPY WITH PROPOFOL N/A 10/10/2016   Procedure: COLONOSCOPY WITH PROPOFOL;  Surgeon: Manya Silvas, MD;  Location: [facility] ENDOSCOPY;   Service: Endoscopy;  Laterality: N/A;  . ROTATOR CUFF REPAIR Right 1995  . VAGINAL HYSTERECTOMY  1978   partial  . wide excision debriedment Left 04/10/14    Family History  Problem Relation Age of Onset  . Diabetes Father   . COPD Mother   . Heart failure Sister   . Emphysema Sister   . Diabetes Sister   . Hypertension Sister   . Emphysema Brother   . Lung cancer Brother   . Crohn's disease Sister   . Hypertension Sister   . Hypertension Sister   . Emphysema Brother   . Cancer Other        great great paternal aunt, ? age  . Cancer Maternal Grandfather 58       ? source  . Cancer Paternal Grandfather 1       ? source  . Breast cancer Neg Hx     Social History Social History   Tobacco Use  . Smoking status: Former Smoker    Packs/day: 1.00    Years: 50.00    Pack years: 50.00    Types: Cigarettes    Last attempt to quit: 09/15/2014    Years since quitting: 3.4  . Smokeless tobacco: Never Used  Substance Use Topics  . Alcohol use: Yes    Alcohol/week: 8.4 oz  Types: 14 Cans of beer per week  . Drug use: No    Allergies  Allergen Reactions  . Codeine Nausea And Vomiting  . Hydrochlorothiazide Swelling    Swelling of tongue.  Mack Hook [Levofloxacin In D5w] Hives and Swelling    Leg swelling Leg swelling    Current Outpatient Medications  Medication Sig Dispense Refill  . albuterol (PROAIR HFA) 108 (90 Base) MCG/ACT inhaler Inhale 2 puffs into the lungs every 6 (six) hours as needed for wheezing or shortness of breath. 18 g 3  . ALPRAZolam (XANAX) 0.5 MG tablet Take 1 tablet (0.5 mg total) by mouth 2 (two) times daily. As needed. 60 tablet 0  . aspirin EC 81 MG tablet Take 81 mg by mouth daily.    . cholecalciferol (VITAMIN D) 1000 units tablet Take 1 tablet (1,000 Units total) by mouth daily. 30 tablet 11  . Fluticasone-Salmeterol (ADVAIR) 250-50 MCG/DOSE AEPB Inhale 1 puff into the lungs 2 (two) times daily. 60 each 5  . metoprolol succinate  (TOPROL-XL) 25 MG 24 hr tablet Take 1 tablet (25 mg total) by mouth daily. 90 tablet 1   No current facility-administered medications for this visit.     Review of Systems Review of Systems  Constitutional: Negative.   Respiratory: Negative.   Cardiovascular: Negative.     Blood pressure (!) 142/80, pulse 79, resp. rate 14, height 5\' 4"  (1.626 m), weight 241 lb (109.3 kg).  Physical Exam Physical Exam  Constitutional: She is oriented to person, place, and time. She appears well-developed and well-nourished.  Eyes: Conjunctivae are normal. No scleral icterus.  Neck: Neck supple.  Cardiovascular: Normal rate, regular rhythm and normal heart sounds.  Pulmonary/Chest: Effort normal and breath sounds normal. Right breast exhibits no inverted nipple, no mass, no nipple discharge, no skin change and no tenderness. Left breast exhibits no inverted nipple, no mass, no nipple discharge, no skin change and no tenderness.    Lymphadenopathy:    She has no cervical adenopathy.    She has no axillary adenopathy.       Right: No supraclavicular adenopathy present.       Left: No supraclavicular adenopathy present.  Neurological: She is alert and oriented to person, place, and time.  Skin: Skin is warm and dry.       Data Reviewed February 05, 2018 bilateral diagnostic mammograms reviewed.  Postsurgical changes on the left.-2.  Screening chest CT dated December 07, 2017: Lungs/Pleura: No pleural effusion identified. No atelectasis, airspace consolidation or pneumothorax. Continued slow interval growth of non solid left lower lobe pulmonary nodule currently measuring 16.7 mm, previously 15.1 mm. On 12/02/2015 this measured 12.3 mm.   Assessment    Stable breast exam.    Plan  The patient has been asked to return to the office in one year with a bilateral diagnostic mammogram.The patient is aware to call back for any questions or concerns.  HPI, Physical Exam, Assessment and Plan have  been scribed under the direction and in the presence of Hervey Ard, MD.  Gaspar Cola, CMA  I have completed the exam and reviewed the above documentation for accuracy and completeness.  I agree with the above.  Haematologist has been used and any errors in dictation or transcription are unintentional.  Hervey Ard, M.D., F.A.C.S.   Forest Gleason Byrnett 02/14/2018, 1:44 PM

## 2018-02-14 ENCOUNTER — Ambulatory Visit (INDEPENDENT_AMBULATORY_CARE_PROVIDER_SITE_OTHER): Payer: Medicare Other | Admitting: Physician Assistant

## 2018-02-14 ENCOUNTER — Ambulatory Visit (INDEPENDENT_AMBULATORY_CARE_PROVIDER_SITE_OTHER): Payer: Medicare Other

## 2018-02-14 ENCOUNTER — Encounter: Payer: Self-pay | Admitting: Physician Assistant

## 2018-02-14 VITALS — BP 148/58 | HR 97 | Temp 99.6°F | Ht 64.0 in | Wt 243.4 lb

## 2018-02-14 VITALS — BP 148/58 | HR 97 | Temp 99.6°F | Resp 16 | Ht 64.0 in | Wt 243.0 lb

## 2018-02-14 DIAGNOSIS — Z0000 Encounter for general adult medical examination without abnormal findings: Secondary | ICD-10-CM

## 2018-02-14 DIAGNOSIS — R739 Hyperglycemia, unspecified: Secondary | ICD-10-CM

## 2018-02-14 DIAGNOSIS — E7800 Pure hypercholesterolemia, unspecified: Secondary | ICD-10-CM

## 2018-02-14 DIAGNOSIS — E039 Hypothyroidism, unspecified: Secondary | ICD-10-CM

## 2018-02-14 DIAGNOSIS — I10 Essential (primary) hypertension: Secondary | ICD-10-CM

## 2018-02-14 DIAGNOSIS — Z6841 Body Mass Index (BMI) 40.0 and over, adult: Secondary | ICD-10-CM | POA: Diagnosis not present

## 2018-02-14 NOTE — Patient Instructions (Addendum)
Michelle Pitts , Thank you for taking time to come for your Medicare Wellness Visit. I appreciate your ongoing commitment to your health goals. Please review the following plan we discussed and let me know if I can assist you in the future.   Screening recommendations/referrals: Colonoscopy: Up to date Mammogram: Up to date Bone Density: Up to date Recommended yearly ophthalmology/optometry visit for glaucoma screening and checkup Recommended yearly dental visit for hygiene and checkup  Vaccinations: Influenza vaccine: Up to date Pneumococcal vaccine: Up to date Tdap vaccine: Pt declines today.  Shingles vaccine: Pt declines today.     Advanced directives: Advance directive discussed with you today. I have provided a copy for you to complete at home and have notarized. Once this is complete please bring a copy in to our office so we can scan it into your chart.  Conditions/risks identified: Obesity- recommend to start eating 3 meals a day with two healthy snacks in between.   Next appointment: 2:20 PM today with Fenton Malling.    Preventive Care 69 Years and Older, Female Preventive care refers to lifestyle choices and visits with your health care provider that can promote health and wellness. What does preventive care include?  A yearly physical exam. This is also called an annual well check.  Dental exams once or twice a year.  Routine eye exams. Ask your health care provider how often you should have your eyes checked.  Personal lifestyle choices, including:  Daily care of your teeth and gums.  Regular physical activity.  Eating a healthy diet.  Avoiding tobacco and drug use.  Limiting alcohol use.  Practicing safe sex.  Taking low-dose aspirin every day.  Taking vitamin and mineral supplements as recommended by your health care provider. What happens during an annual well check? The services and screenings done by your health care provider during your annual  well check will depend on your age, overall health, lifestyle risk factors, and family history of disease. Counseling  Your health care provider may ask you questions about your:  Alcohol use.  Tobacco use.  Drug use.  Emotional well-being.  Home and relationship well-being.  Sexual activity.  Eating habits.  History of falls.  Memory and ability to understand (cognition).  Work and work Statistician.  Reproductive health. Screening  You may have the following tests or measurements:  Height, weight, and BMI.  Blood pressure.  Lipid and cholesterol levels. These may be checked every 5 years, or more frequently if you are over 59 years old.  Skin check.  Lung cancer screening. You may have this screening every year starting at age 48 if you have a 30-pack-year history of smoking and currently smoke or have quit within the past 15 years.  Fecal occult blood test (FOBT) of the stool. You may have this test every year starting at age 50.  Flexible sigmoidoscopy or colonoscopy. You may have a sigmoidoscopy every 5 years or a colonoscopy every 10 years starting at age 4.  Hepatitis C blood test.  Hepatitis B blood test.  Sexually transmitted disease (STD) testing.  Diabetes screening. This is done by checking your blood sugar (glucose) after you have not eaten for a while (fasting). You may have this done every 1-3 years.  Bone density scan. This is done to screen for osteoporosis. You may have this done starting at age 48.  Mammogram. This may be done every 1-2 years. Talk to your health care provider about how often you should have regular  mammograms. Talk with your health care provider about your test results, treatment options, and if necessary, the need for more tests. Vaccines  Your health care provider may recommend certain vaccines, such as:  Influenza vaccine. This is recommended every year.  Tetanus, diphtheria, and acellular pertussis (Tdap, Td) vaccine.  You may need a Td booster every 10 years.  Zoster vaccine. You may need this after age 57.  Pneumococcal 13-valent conjugate (PCV13) vaccine. One dose is recommended after age 38.  Pneumococcal polysaccharide (PPSV23) vaccine. One dose is recommended after age 32. Talk to your health care provider about which screenings and vaccines you need and how often you need them. This information is not intended to replace advice given to you by your health care provider. Make sure you discuss any questions you have with your health care provider. Document Released: 08/28/2015 Document Revised: 04/20/2016 Document Reviewed: 06/02/2015 Elsevier Interactive Patient Education  2017 [employer] Prevention in the Home Falls can cause injuries. They can happen to people of all ages. There are many things you can do to make your home safe and to help prevent falls. What can I do on the outside of my home?  Regularly fix the edges of walkways and driveways and fix any cracks.  Remove anything that might make you trip as you walk through a door, such as a raised step or threshold.  Trim any bushes or trees on the path to your home.  Use bright outdoor lighting.  Clear any walking paths of anything that might make someone trip, such as rocks or tools.  Regularly check to see if handrails are loose or broken. Make sure that both sides of any steps have handrails.  Any raised decks and porches should have guardrails on the edges.  Have any leaves, snow, or ice cleared regularly.  Use sand or salt on walking paths during winter.  Clean up any spills in your garage right away. This includes oil or grease spills. What can I do in the bathroom?  Use night lights.  Install grab bars by the toilet and in the tub and shower. Do not use towel bars as grab bars.  Use non-skid mats or decals in the tub or shower.  If you need to sit down in the shower, use a plastic, non-slip stool.  Keep the  floor dry. Clean up any water that spills on the floor as soon as it happens.  Remove soap buildup in the tub or shower regularly.  Attach bath mats securely with double-sided non-slip rug tape.  Do not have throw rugs and other things on the floor that can make you trip. What can I do in the bedroom?  Use night lights.  Make sure that you have a light by your bed that is easy to reach.  Do not use any sheets or blankets that are too big for your bed. They should not hang down onto the floor.  Have a firm chair that has side arms. You can use this for support while you get dressed.  Do not have throw rugs and other things on the floor that can make you trip. What can I do in the kitchen?  Clean up any spills right away.  Avoid walking on wet floors.  Keep items that you use a lot in easy-to-reach places.  If you need to reach something above you, use a strong step stool that has a grab bar.  Keep electrical cords out of the way.  Do not use floor polish or wax that makes floors slippery. If you must use wax, use non-skid floor wax.  Do not have throw rugs and other things on the floor that can make you trip. What can I do with my stairs?  Do not leave any items on the stairs.  Make sure that there are handrails on both sides of the stairs and use them. Fix handrails that are broken or loose. Make sure that handrails are as long as the stairways.  Check any carpeting to make sure that it is firmly attached to the stairs. Fix any carpet that is loose or worn.  Avoid having throw rugs at the top or bottom of the stairs. If you do have throw rugs, attach them to the floor with carpet tape.  Make sure that you have a light switch at the top of the stairs and the bottom of the stairs. If you do not have them, ask someone to add them for you. What else can I do to help prevent falls?  Wear shoes that:  Do not have high heels.  Have rubber bottoms.  Are comfortable and fit  you well.  Are closed at the toe. Do not wear sandals.  If you use a stepladder:  Make sure that it is fully opened. Do not climb a closed stepladder.  Make sure that both sides of the stepladder are locked into place.  Ask someone to hold it for you, if possible.  Clearly mark and make sure that you can see:  Any grab bars or handrails.  First and last steps.  Where the edge of each step is.  Use tools that help you move around (mobility aids) if they are needed. These include:  Canes.  Walkers.  Scooters.  Crutches.  Turn on the lights when you go into a dark area. Replace any light bulbs as soon as they burn out.  Set up your furniture so you have a clear path. Avoid moving your furniture around.  If any of your floors are uneven, fix them.  If there are any pets around you, be aware of where they are.  Review your medicines with your doctor. Some medicines can make you feel dizzy. This can increase your chance of falling. Ask your doctor what other things that you can do to help prevent falls. This information is not intended to replace advice given to you by your health care provider. Make sure you discuss any questions you have with your health care provider. Document Released: 05/28/2009 Document Revised: 01/07/2016 Document Reviewed: 09/05/2014 Elsevier Interactive Patient Education  2017 [employer].

## 2018-02-14 NOTE — Progress Notes (Signed)
Patient: Michelle Pitts, Female    DOB: Apr 28, 1951, 67 y.o.   MRN: [id number] Visit Date: 02/14/2018  Today's Provider: Mar Daring, PA-C   Chief Complaint  Patient presents with  . Medicare Wellness   Subjective:     Complete Physical Michelle Pitts is a 67 y.o. female. She feels well. She reports exercising none. She reports she is sleeping well.  02/14/18 AWV w [facility] 02/09/17 CPE 02/05/18 Mammogram-BI-RADS 2; followed by Dr. Linard Millers breast cancer 10/10/16 Colonoscopy-polyp, recheck in 5 years -----------------------------------------------------------   Review of Systems  Constitutional: Negative.   HENT: Positive for hearing loss.   Eyes: Negative.   Respiratory: Positive for apnea, shortness of breath and wheezing.   Cardiovascular: Negative.   Gastrointestinal: Negative.   Endocrine: Positive for heat intolerance and polyuria.  Genitourinary: Positive for enuresis.  Musculoskeletal: Negative.   Skin: Negative.   Allergic/Immunologic: Negative.   Neurological: Positive for numbness.  Hematological: Negative.   Psychiatric/Behavioral: Negative.     Social History   Socioeconomic History  . Marital status: Divorced    Spouse name: Not on file  . Number of children: 2  . Years of education: Not on file  . Highest education level: Some college, no degree  Occupational History  . Occupation: retired  Scientific laboratory technician  . Financial resource strain: Not hard at all  . Food insecurity:    Worry: Never true    Inability: Never true  . Transportation needs:    Medical: No    Non-medical: No  Tobacco Use  . Smoking status: Former Smoker    Packs/day: 1.00    Years: 50.00    Pack years: 50.00    Types: Cigarettes    Last attempt to quit: 09/15/2014    Years since quitting: 3.4  . Smokeless tobacco: Never Used  Substance and Sexual Activity  . Alcohol use: Yes    Alcohol/week: 8.4 oz    Types: 14 Cans of beer per week  . Drug use: No    . Sexual activity: Not on file  Lifestyle  . Physical activity:    Days per week: Not on file    Minutes per session: Not on file  . Stress: Not at all  Relationships  . Social connections:    Talks on phone: Not on file    Gets together: Not on file    Attends religious service: Not on file    Active member of club or organization: Not on file    Attends meetings of clubs or organizations: Not on file    Relationship status: Not on file  . Intimate partner violence:    Fear of current or ex partner: Not on file    Emotionally abused: Not on file    Physically abused: Not on file    Forced sexual activity: Not on file  Other Topics Concern  . Not on file  Social History Narrative  . Not on file    Past Medical History:  Diagnosis Date  . Anxiety   . Breast cancer ([place]) 01/23/2014   Left breast, T1c, N0, M0. ER/PR +; Her 2 neu not overexpressed. Oncotype DX: 13,Low risk.9% over 10 years.   . Cancer ([place]) 1995   vulvar, [facility]  . COPD (chronic obstructive pulmonary disease) ([facility])    bronchitis  . Dysrhythmia   . GERD (gastroesophageal reflux disease)   . History of colon polyps   . Hypercholesterolemia   . Hypertension   .  Hypothyroidism   . Insomnia   . 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 Personal history of radiation therapy   . Personal history of tobacco use, presenting hazards to health 11/18/2015  . Sleep apnea   . Sleep apnea   . SOB (shortness of breath) on exertion   . Spinal stenosis   . Thyroid disease   . Vitamin D deficiency      Patient Active Problem List   Diagnosis Date Noted  . Prediabetes 11/30/2017  . DNR no code (do not resuscitate) 02/08/2016  . Personal history of tobacco use, presenting hazards to health 11/18/2015  . Breathlessness on exertion 07/24/2015  . Malignant neoplasm of lower-inner quadrant of left female breast ([place]) 07/22/2015  . Tachycardia 07/22/2015  . Anxiety 05/15/2015  . Colon polyp 05/15/2015  . CAFL (chronic airflow  limitation) ([place]) 05/15/2015  . Elevated blood sugar 05/15/2015  . Abnormal liver enzymes 05/15/2015  . Family history of diabetes mellitus 05/15/2015  . BP (high blood pressure) 05/15/2015  . Cannot sleep 05/15/2015  . Apnea, sleep 05/15/2015  . Avitaminosis D 05/15/2015  . 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 Anxiety disorder 05/15/2015  . Chronic obstructive pulmonary disease ([place]) 05/15/2015  . Gastro-esophageal reflux disease without esophagitis 05/15/2015  . 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 Adult hypothyroidism 05/15/2015  . Pure hypercholesterolemia 05/15/2015  . 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 Nicotine addiction 03/09/2015    Past Surgical History:  Procedure Laterality Date  . BLADDER SUSPENSION  2001  . BREAST BIOPSY Right 1975, 1985   benign cyst [name]) and fibroadenoma([facility])  . BREAST BIOPSY Right 01/03/2014   Papilloma, no atypia detected on mammography.  Marland Kitchen BREAST BIOPSY Left 01/03/2014   Invasive mammary carcinoma.  Marland Kitchen BREAST BIOPSY Left 01/13/2015   ultrasound giuded biopsy, negative  . BREAST CYST ASPIRATION Left 1985  . BREAST LUMPECTOMY Left 01/23/2014   positive  . BREAST SURGERY Left 01/23/14   wide excision   . BREAST SURGERY  04/10/14   Debridement of fat necrosis  . CHOLECYSTECTOMY    . COLONOSCOPY  2014   Dr Tiffany Kocher  . COLONOSCOPY WITH PROPOFOL N/A 10/10/2016   Procedure: COLONOSCOPY WITH PROPOFOL;  Surgeon: Manya Silvas, MD;  Location: [facility];  Service: Endoscopy;  Laterality: N/A;  . ROTATOR CUFF REPAIR Right 1995  . VAGINAL HYSTERECTOMY  1978   partial  . wide excision debriedment Left 04/10/14    Her family history includes COPD in her mother; Cancer in her other; Cancer (age of onset: 25) in her maternal grandfather; Cancer (age of onset: 41) in her paternal grandfather; Crohn's disease in her sister; Diabetes in her father and sister; Emphysema in her brother, brother, and sister; Heart failure  in her sister; Hypertension in her sister, sister, and sister; Lung cancer in her brother. There is no history of Breast cancer.      Current Outpatient Medications:  .  albuterol (PROAIR HFA) 108 (90 Base) MCG/ACT inhaler, Inhale 2 puffs into the lungs every 6 (six) hours as needed for wheezing or shortness of breath., Disp: 18 g, Rfl: 3 .  ALPRAZolam (XANAX) 0.5 MG tablet, Take 1 tablet (0.5 mg total) by mouth 2 (two) times daily. As needed., Disp: 60 tablet, Rfl: 0 .  aspirin EC 81 MG tablet, Take 81 mg by mouth daily., Disp: , Rfl:  .  cholecalciferol (VITAMIN D) 1000 units tablet, Take 1 tablet (1,000 Units total) by mouth daily., Disp: 30 tablet,  Rfl: 11 .  Fluticasone-Salmeterol (ADVAIR) 250-50 MCG/DOSE AEPB, Inhale 1 puff into the lungs 2 (two) times daily., Disp: 60 each, Rfl: 5 .  glucose blood (ONETOUCH VERIO) test strip, , Disp: , Rfl:  .  metoprolol succinate (TOPROL-XL) 25 MG 24 hr tablet, Take 1 tablet (25 mg total) by mouth daily., Disp: 90 tablet, Rfl: 1  Patient Care Team: Mar Daring, PA-C as PCP - General (Family Medicine) Bary Castilla, Forest Gleason, MD (General Surgery) Vladimir Crofts, MD as Consulting Physician (Neurology)     Objective:   Vitals: BP (!) 148/58 (BP Location: Left Arm, Patient Position: Sitting, Cuff Size: Large)   Pulse 97   Temp 99.6 F (37.6 C) (Oral)   Resp 16   Ht 5\' 4"  (1.626 m)   Wt 243 lb (110.2 kg)   BMI 41.71 kg/m   Physical Exam  Activities of Daily Living In your present state of health, do you have any difficulty performing the following activities: 02/14/2018  Hearing? N  Vision? N  Difficulty concentrating or making decisions? N  Walking or climbing stairs? N  Dressing or bathing? N  Doing errands, shopping? N  Preparing Food and eating ? N  Using the Toilet? N  In the past six months, have you accidently leaked urine? N  Do you have problems with loss of bowel control? N  Managing your Medications? N  Managing your  Finances? N  Housekeeping or managing your Housekeeping? N  Some recent data might be hidden    Fall Risk Assessment Fall Risk  02/14/2018 06/02/2017 02/02/2017 02/08/2016  Falls in the past year? No No No No     Depression Screen PHQ 2/9 Scores 02/14/2018 06/02/2017 02/02/2017 02/08/2016  PHQ - 2 Score 0 0 0 4  PHQ- 9 Score - - - 10    Cognitive Testing - 6-CIT  Correct? Score   What year is it? yes 0 0 or 4  What month is it? yes 0 0 or 3  Memorize:    Pia Mau,  40,  [address]  [place],      What time is it? (within 1 hour) yes 0 0 or 3  Count backwards from 20 yes 0 0, 2, or 4  Name the months of the year yes 0 0, 2, or 4  Repeat name & address above no 2 0, 2, 4, 6, 8, or 10       TOTAL SCORE  2/28   Interpretation:  Normal  Normal (0-7) Abnormal (8-28)       Assessment & Plan:    Annual Physical Reviewed patient's Family Medical History Reviewed and updated list of patient's medical providers Assessment of cognitive impairment was done Assessed patient's functional ability Established a written schedule for health screening [place] Completed and Reviewed  Exercise Activities and Dietary recommendations Goals    None      Immunization History  Administered Date(s) Administered  . Influenza, High Dose Seasonal PF 06/25/2015, 06/02/2016, 05/17/2017  . Pneumococcal Conjugate-13 05/16/2014  . Pneumococcal Polysaccharide-23 06/16/2007, 02/08/2016  . Zoster 09/17/2007    Health Maintenance  Topic Date Due  . TETANUS/TDAP  08/15/2026 (Originally 05/14/1968)  . INFLUENZA VACCINE  03/15/2018  . MAMMOGRAM  02/06/2020  . COLONOSCOPY  10/10/2026  . DEXA SCAN  Completed  . Hepatitis C Screening  Completed  . PNA vac Low Risk Adult  Completed     Discussed health benefits of physical activity, and encouraged her to  engage in regular exercise appropriate for her age and condition.    1. Annual physical exam Normal physical exam today.  Will check labs as below and f/u pending lab results. If labs are stable and WNL she will not need to have these rechecked for one year at her next annual physical exam. She is to call the office in the meantime if she has any acute issue, questions or concerns.  2. Essential hypertension Elevated today slightly. Patient admits to sometimes forgetting her medicine. Continue metoprolol XR 25mg  daily. Will check labs as below and f/u pending results. - CBC with Differential/Platelet - Comprehensive metabolic panel - Hemoglobin A1c - Lipid Panel With LDL/HDL Ratio  3. Adult hypothyroidism Stable. Asymptomatic. Will check labs as below and f/u pending results. - Comprehensive metabolic panel - TSH  4. Pure hypercholesterolemia Stable. Diet controlled. Will check labs as below and f/u pending results. - CBC with Differential/Platelet - Comprehensive metabolic panel - Hemoglobin A1c - Lipid Panel With LDL/HDL Ratio  5. Elevated blood sugar Diet controlled. Will check labs as below and f/u pending results. - CBC with Differential/Platelet - Comprehensive metabolic panel - Hemoglobin A1c - Lipid Panel With LDL/HDL Ratio  6. Class 3 severe obesity due to excess calories with serious comorbidity and body mass index (BMI) of 40.0 to 44.9 in adult [facility]) Counseled patient on healthy lifestyle modifications including dieting and exercise.   ------------------------------------------------------------------------------------------------------------    Mar Daring, PA-C  [place]

## 2018-02-14 NOTE — Progress Notes (Signed)
Subjective:   Michelle Pitts is a 67 y.o. female who presents for Medicare Annual (Subsequent) preventive examination.  Review of Systems:  N/A  Cardiac Risk Factors include: advanced age (>54men, >47 women);dyslipidemia;hypertension;obesity (BMI >30kg/m2)     Objective:     Vitals: BP (!) 148/58 (BP Location: Right Arm)   Pulse 97   Temp 99.6 F (37.6 C) (Oral)   Ht 5\' 4"  (1.626 m)   Wt 243 lb 6.4 oz (110.4 kg)   BMI 41.78 kg/m   Body mass index is 41.78 kg/m.  Advanced Directives 02/14/2018 06/02/2017 02/02/2017 10/10/2016 02/08/2016 10/27/2015 05/29/2015  Does Patient Have a Medical Advance Directive? No No No Yes Yes No No  Type of Advance Directive - - - - (No Data) - -  Would patient like information on creating a medical advance directive? Yes (MAU/Ambulatory/Procedural Areas - Information given) No - Patient declined No - Patient declined - Yes - Educational materials given - -    Tobacco Social History   Tobacco Use  Smoking Status Former Smoker  . Packs/day: 1.00  . Years: 50.00  . Pack years: 50.00  . Types: Cigarettes  . Last attempt to quit: 09/15/2014  . Years since quitting: 3.4  Smokeless Tobacco Never Used     Counseling given: Not Answered   Clinical Intake:  Pre-visit preparation completed: Yes  Pain : No/denies pain Pain Score: 0-No pain     Nutritional Status: BMI > 30  Obese Nutritional Risks: None Diabetes: No  How often do you need to have someone help you when you read instructions, pamphlets, or other written materials from your doctor or pharmacy?: 1 - Never  Interpreter Needed?: No  Information entered by :: [facility], LPN  Past Medical History:  Diagnosis Date  . Anxiety   . Breast cancer ([place]) 01/23/2014   Left breast, T1c, N0, M0. ER/PR +; Her 2 neu not overexpressed. Oncotype DX: 13,Low risk.9% over 10 years.   . Cancer ([place]) 1995   vulvar, [facility]  . COPD (chronic obstructive pulmonary disease) ([facility])    bronchitis    . Dysrhythmia   . GERD (gastroesophageal reflux disease)   . History of colon polyps   . Hypercholesterolemia   . Hypertension   . Hypothyroidism   . Insomnia   . 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 Personal history of radiation therapy   . Personal history of tobacco use, presenting hazards to health 11/18/2015  . Sleep apnea   . Sleep apnea   . SOB (shortness of breath) on exertion   . Spinal stenosis   . Thyroid disease   . Vitamin D deficiency    Past Surgical History:  Procedure Laterality Date  . BLADDER SUSPENSION  2001  . BREAST BIOPSY Right 1975, 1985   benign cyst [name]) and fibroadenoma([facility])  . BREAST BIOPSY Right 01/03/2014   Papilloma, no atypia detected on mammography.  Marland Kitchen BREAST BIOPSY Left 01/03/2014   Invasive mammary carcinoma.  Marland Kitchen BREAST BIOPSY Left 01/13/2015   ultrasound giuded biopsy, negative  . BREAST CYST ASPIRATION Left 1985  . BREAST LUMPECTOMY Left 01/23/2014   positive  . BREAST SURGERY Left 01/23/14   wide excision   . BREAST SURGERY  04/10/14   Debridement of fat necrosis  . CHOLECYSTECTOMY    . COLONOSCOPY  2014   Dr Tiffany Kocher  . COLONOSCOPY WITH PROPOFOL N/A 10/10/2016   Procedure: COLONOSCOPY WITH PROPOFOL;  Surgeon: Manya Silvas, MD;  Location: [facility] ENDOSCOPY;  Service: Endoscopy;  Laterality: N/A;  . ROTATOR CUFF REPAIR Right 1995  . VAGINAL HYSTERECTOMY  1978   partial  . wide excision debriedment Left 04/10/14   Family History  Problem Relation Age of Onset  . Diabetes Father   . COPD Mother   . Heart failure Sister   . Emphysema Sister   . Diabetes Sister   . Hypertension Sister   . Emphysema Brother   . Lung cancer Brother   . Crohn's disease Sister   . Hypertension Sister   . Hypertension Sister   . Emphysema Brother   . Cancer Other        great great paternal aunt, ? age  . Cancer Maternal Grandfather 48       ? source  . Cancer Paternal Grandfather 69       ? source  . Breast cancer Neg Hx    Social  History   Socioeconomic History  . Marital status: Divorced    Spouse name: Not on file  . Number of children: 2  . Years of education: Not on file  . Highest education level: Some college, no degree  Occupational History  . Occupation: retired  Scientific laboratory technician  . Financial resource strain: Not hard at all  . Food insecurity:    Worry: Never true    Inability: Never true  . Transportation needs:    Medical: No    Non-medical: No  Tobacco Use  . Smoking status: Former Smoker    Packs/day: 1.00    Years: 50.00    Pack years: 50.00    Types: Cigarettes    Last attempt to quit: 09/15/2014    Years since quitting: 3.4  . Smokeless tobacco: Never Used  Substance and Sexual Activity  . Alcohol use: Yes    Alcohol/week: 8.4 oz    Types: 14 Cans of beer per week  . Drug use: No  . Sexual activity: Not on file  Lifestyle  . Physical activity:    Days per week: Not on file    Minutes per session: Not on file  . Stress: Not at all  Relationships  . Social connections:    Talks on phone: Not on file    Gets together: Not on file    Attends religious service: Not on file    Active member of club or organization: Not on file    Attends meetings of clubs or organizations: Not on file    Relationship status: Not on file  Other Topics Concern  . Not on file  Social History Narrative  . Not on file    Outpatient Encounter Medications as of 02/14/2018  Medication Sig  . albuterol (PROAIR HFA) 108 (90 Base) MCG/ACT inhaler Inhale 2 puffs into the lungs every 6 (six) hours as needed for wheezing or shortness of breath.  . ALPRAZolam (XANAX) 0.5 MG tablet Take 1 tablet (0.5 mg total) by mouth 2 (two) times daily. As needed.  Marland Kitchen aspirin EC 81 MG tablet Take 81 mg by mouth daily.  . cholecalciferol (VITAMIN D) 1000 units tablet Take 1 tablet (1,000 Units total) by mouth daily.  . Fluticasone-Salmeterol (ADVAIR) 250-50 MCG/DOSE AEPB Inhale 1 puff into the lungs 2 (two) times daily.  .  metoprolol succinate (TOPROL-XL) 25 MG 24 hr tablet Take 1 tablet (25 mg total) by mouth daily.   No facility-administered encounter medications on file as of 02/14/2018.     Activities of Daily Living In your present state of  health, do you have any difficulty performing the following activities: 02/14/2018  Hearing? N  Vision? N  Difficulty concentrating or making decisions? N  Walking or climbing stairs? N  Dressing or bathing? N  Doing errands, shopping? N  Preparing Food and eating ? N  Using the Toilet? N  In the past six months, have you accidently leaked urine? N  Do you have problems with loss of bowel control? N  Managing your Medications? N  Managing your Finances? N  Housekeeping or managing your Housekeeping? N  Some recent data might be hidden    Patient Care Team: Rubye Beach as PCP - General (Family Medicine) Bary Castilla, Forest Gleason, MD (General Surgery) Vladimir Crofts, MD as Consulting Physician (Neurology)    Assessment:   This is a routine wellness examination for Michelle Pitts.  Exercise Activities and Dietary recommendations Current Exercise Habits: The patient does not participate in regular exercise at present, Exercise limited by: neurologic condition(s);orthopedic condition(s)  Goals    None      Fall Risk Fall Risk  02/14/2018 06/02/2017 02/02/2017 02/08/2016  Falls in the past year? No No No No   Is the patient's home free of loose throw rugs in walkways, pet beds, electrical cords, etc?   yes      Grab bars in the bathroom? yes      Handrails on the stairs?   yes      Adequate lighting?   yes  Timed Get Up and Go performed: N/A  Depression Screen PHQ 2/9 Scores 02/14/2018 06/02/2017 02/02/2017 02/08/2016  PHQ - 2 Score 0 0 0 4  PHQ- 9 Score - - - 10     Cognitive Function: Pt declined screening today.      6CIT Screen 02/02/2017  What Year? 0 points  What month? 0 points  What time? 0 points  Count back from 20 0 points  Months in  reverse 0 points  Repeat phrase 2 points  Total Score 2    Immunization History  Administered Date(s) Administered  . Influenza, High Dose Seasonal PF 06/25/2015, 06/02/2016, 05/17/2017  . Pneumococcal Conjugate-13 05/16/2014  . Pneumococcal Polysaccharide-23 06/16/2007, 02/08/2016  . Zoster 09/17/2007    Qualifies for Shingles Vaccine? Due for Shingles vaccine. Declined my offer to administer today. Education has been provided regarding the importance of this vaccine. Pt has been advised to call her insurance company to determine her out of pocket expense. Advised she may also receive this vaccine at her local pharmacy or Health Dept. Verbalized acceptance and understanding.  Screening Tests Health Maintenance  Topic Date Due  . TETANUS/TDAP  08/15/2026 (Originally 05/14/1970)  . INFLUENZA VACCINE  03/15/2018  . MAMMOGRAM  02/06/2020  . COLONOSCOPY  10/10/2026  . DEXA SCAN  Completed  . Hepatitis C Screening  Completed  . PNA vac Low Risk Adult  Completed    Cancer Screenings: Lung: Low Dose CT Chest recommended if Age 22-80 years, 30 pack-year currently smoking OR have quit w/in 15years. Patient does qualify, however has had a chest CT scan in April of this year.  Breast:  Up to date on Mammogram? Yes   Up to date of Bone Density/Dexa? Yes Colorectal: Up to date  Additional Screenings:  Hepatitis C Screening: Up to date     Plan:  I have personally reviewed and addressed the Medicare Annual Wellness questionnaire and have noted the following in the patient's chart:  A. Medical and social history B. Use of alcohol, tobacco  or illicit drugs  C. Current medications and supplements D. Functional ability and status E.  Nutritional status F.  Physical activity G. Advance directives H. List of other physicians I.  Hospitalizations, surgeries, and ER visits in previous 12 months J.  [place] such as hearing and vision if needed, cognitive and  depression L. Referrals and appointments - none  In addition, I have reviewed and discussed with patient certain preventive protocols, quality metrics, and best practice recommendations. A written personalized care plan for preventive services as well as general preventive health recommendations were provided to patient.  See attached scanned questionnaire for additional information.   Signed,  Fabio Neighbors, LPN Nurse Health Advisor   Nurse Recommendations: None, pt declined the tetanus vaccine today.

## 2018-02-14 NOTE — Patient Instructions (Signed)
Health Maintenance for Postmenopausal Women Menopause is a normal process in which your reproductive ability comes to an end. This process happens gradually over a span of months to years, usually between the ages of 22 and 9. Menopause is complete when you have missed 12 consecutive menstrual periods. It is important to talk with your health care provider about some of the most common conditions that affect postmenopausal women, such as heart disease, cancer, and bone loss (osteoporosis). Adopting a healthy lifestyle and getting preventive care can help to promote your health and wellness. Those actions can also lower your chances of developing some of these common conditions. What should I know about menopause? During menopause, you may experience a number of symptoms, such as:  Moderate-to-severe hot flashes.  Night sweats.  Decrease in sex drive.  Mood swings.  Headaches.  Tiredness.  Irritability.  Memory problems.  Insomnia.  Choosing to treat or not to treat menopausal changes is an individual decision that you make with your health care provider. What should I know about hormone replacement therapy and supplements? Hormone therapy products are effective for treating symptoms that are associated with menopause, such as hot flashes and night sweats. Hormone replacement carries certain risks, especially as you become older. If you are thinking about using estrogen or estrogen with progestin treatments, discuss the benefits and risks with your health care provider. What should I know about heart disease and stroke? Heart disease, heart attack, and stroke become more likely as you age. This may be due, in part, to the hormonal changes that your body experiences during menopause. These can affect how your body processes dietary fats, triglycerides, and cholesterol. Heart attack and stroke are both medical emergencies. There are many things that you can do to help prevent heart disease  and stroke:  Have your blood pressure checked at least every 1-2 years. High blood pressure causes heart disease and increases the risk of stroke.  If you are 53-22 years old, ask your health care provider if you should take aspirin to prevent a heart attack or a stroke.  Do not use any tobacco products, including cigarettes, chewing tobacco, or electronic cigarettes. If you need help quitting, ask your health care provider.  It is important to eat a healthy diet and maintain a healthy weight. ? Be sure to include plenty of vegetables, fruits, low-fat dairy products, and lean protein. ? Avoid eating foods that are high in solid fats, added sugars, or salt (sodium).  Get regular exercise. This is one of the most important things that you can do for your health. ? Try to exercise for at least 150 minutes each week. The type of exercise that you do should increase your heart rate and make you sweat. This is known as moderate-intensity exercise. ? Try to do strengthening exercises at least twice each week. Do these in addition to the moderate-intensity exercise.  Know your numbers.Ask your health care provider to check your cholesterol and your blood glucose. Continue to have your blood tested as directed by your health care provider.  What should I know about cancer screening? There are several types of cancer. Take the following steps to reduce your risk and to catch any cancer development as early as possible. Breast Cancer  Practice breast self-awareness. ? This means understanding how your breasts normally appear and feel. ? It also means doing regular breast self-exams. Let your health care provider know about any changes, no matter how small.  If you are 40  or older, have a clinician do a breast exam (clinical breast exam or CBE) every year. Depending on your age, family history, and medical history, it may be recommended that you also have a yearly breast X-ray (mammogram).  If you  have a family history of breast cancer, talk with your health care provider about genetic screening.  If you are at high risk for breast cancer, talk with your health care provider about having an MRI and a mammogram every year.  Breast cancer (BRCA) gene test is recommended for women who have family members with BRCA-related cancers. Results of the assessment will determine the need for genetic counseling and BRCA1 and for BRCA2 testing. BRCA-related cancers include these types: ? Breast. This occurs in males or females. ? Ovarian. ? Tubal. This may also be called fallopian tube cancer. ? Cancer of the abdominal or pelvic lining (peritoneal cancer). ? Prostate. ? Pancreatic.  Cervical, Uterine, and Ovarian Cancer Your health care provider may recommend that you be screened regularly for cancer of the pelvic organs. These include your ovaries, uterus, and vagina. This screening involves a pelvic exam, which includes checking for microscopic changes to the surface of your cervix (Pap test).  For women ages 21-65, health care providers may recommend a pelvic exam and a Pap test every three years. For women ages 79-65, they may recommend the Pap test and pelvic exam, combined with testing for human papilloma virus (HPV), every five years. Some types of HPV increase your risk of cervical cancer. Testing for HPV may also be done on women of any age who have unclear Pap test results.  Other health care providers may not recommend any screening for nonpregnant women who are considered low risk for pelvic cancer and have no symptoms. Ask your health care provider if a screening pelvic exam is right for you.  If you have had past treatment for cervical cancer or a condition that could lead to cancer, you need Pap tests and screening for cancer for at least 20 years after your treatment. If Pap tests have been discontinued for you, your risk factors (such as having a new sexual partner) need to be  reassessed to determine if you should start having screenings again. Some women have medical problems that increase the chance of getting cervical cancer. In these cases, your health care provider may recommend that you have screening and Pap tests more often.  If you have a family history of uterine cancer or ovarian cancer, talk with your health care provider about genetic screening.  If you have vaginal bleeding after reaching menopause, tell your health care provider.  There are currently no reliable tests available to screen for ovarian cancer.  Lung Cancer Lung cancer screening is recommended for adults 69-62 years old who are at high risk for lung cancer because of a history of smoking. A yearly low-dose CT scan of the lungs is recommended if you:  Currently smoke.  Have a history of at least 30 pack-years of smoking and you currently smoke or have quit within the past 15 years. A pack-year is smoking an average of one pack of cigarettes per day for one year.  Yearly screening should:  Continue until it has been 15 years since you quit.  Stop if you develop a health problem that would prevent you from having lung cancer treatment.  Colorectal Cancer  This type of cancer can be detected and can often be prevented.  Routine colorectal cancer screening usually begins at  age 42 and continues through age 45.  If you have risk factors for colon cancer, your health care provider may recommend that you be screened at an earlier age.  If you have a family history of colorectal cancer, talk with your health care provider about genetic screening.  Your health care provider may also recommend using home test kits to check for hidden blood in your stool.  A small camera at the end of a tube can be used to examine your colon directly (sigmoidoscopy or colonoscopy). This is done to check for the earliest forms of colorectal cancer.  Direct examination of the colon should be repeated every  5-10 years until age 71. However, if early forms of precancerous polyps or small growths are found or if you have a family history or genetic risk for colorectal cancer, you may need to be screened more often.  Skin Cancer  Check your skin from head to toe regularly.  Monitor any moles. Be sure to tell your health care provider: ? About any new moles or changes in moles, especially if there is a change in a mole's shape or color. ? If you have a mole that is larger than the size of a pencil eraser.  If any of your family members has a history of skin cancer, especially at a young age, talk with your health care provider about genetic screening.  Always use sunscreen. Apply sunscreen liberally and repeatedly throughout the day.  Whenever you are outside, protect yourself by wearing long sleeves, pants, a wide-brimmed hat, and sunglasses.  What should I know about osteoporosis? Osteoporosis is a condition in which bone destruction happens more quickly than new bone creation. After menopause, you may be at an increased risk for osteoporosis. To help prevent osteoporosis or the bone fractures that can happen because of osteoporosis, the following is recommended:  If you are 46-71 years old, get at least 1,000 mg of calcium and at least 600 mg of vitamin D per day.  If you are older than age 55 but younger than age 65, get at least 1,200 mg of calcium and at least 600 mg of vitamin D per day.  If you are older than age 54, get at least 1,200 mg of calcium and at least 800 mg of vitamin D per day.  Smoking and excessive alcohol intake increase the risk of osteoporosis. Eat foods that are rich in calcium and vitamin D, and do weight-bearing exercises several times each week as directed by your health care provider. What should I know about how menopause affects my mental health? Depression may occur at any age, but it is more common as you become older. Common symptoms of depression  include:  Low or sad mood.  Changes in sleep patterns.  Changes in appetite or eating patterns.  Feeling an overall lack of motivation or enjoyment of activities that you previously enjoyed.  Frequent crying spells.  Talk with your health care provider if you think that you are experiencing depression. What should I know about immunizations? It is important that you get and maintain your immunizations. These include:  Tetanus, diphtheria, and pertussis (Tdap) booster vaccine.  Influenza every year before the flu season begins.  Pneumonia vaccine.  Shingles vaccine.  Your health care provider may also recommend other immunizations. This information is not intended to replace advice given to you by your health care provider. Make sure you discuss any questions you have with your health care provider. Document Released: 09/23/2005  Document Revised: 02/19/2016 Document Reviewed: 05/05/2015 Elsevier Interactive Patient Education  2018 [employer]  

## 2018-02-15 LAB — LIPID PANEL WITH LDL/HDL RATIO
Cholesterol, Total: 218 mg/dL — ABNORMAL HIGH (ref 100–199)
HDL: 79 mg/dL (ref >39)
LDL Calculated: 111 mg/dL — ABNORMAL HIGH (ref 0–99)
LDL/HDL RATIO: 1.4 ratio (ref 0.0–3.2)
Triglycerides: 138 mg/dL (ref 0–149)
VLDL CHOLESTEROL CAL: 28 mg/dL (ref 5–40)

## 2018-02-15 LAB — COMPREHENSIVE METABOLIC PANEL
A/G RATIO: 1.8 (ref 1.2–2.2)
ALT: 41 IU/L — ABNORMAL HIGH (ref 0–32)
AST: 28 IU/L (ref 0–40)
Albumin: 4.4 g/dL (ref 3.6–4.8)
Alkaline Phosphatase: 134 IU/L — ABNORMAL HIGH (ref 39–117)
BUN / CREAT RATIO: 24 (ref 12–28)
BUN: 19 mg/dL (ref 8–27)
Bilirubin Total: 0.2 mg/dL (ref 0.0–1.2)
CO2: 23 mmol/L (ref 20–29)
Calcium: 10.2 mg/dL (ref 8.7–10.3)
Chloride: 103 mmol/L (ref 96–106)
Creatinine, Ser: 0.8 mg/dL (ref 0.57–1.00)
GFR calc Af Amer: 88 mL/min/{1.73_m2} (ref >59)
GFR, EST NON AFRICAN AMERICAN: 76 mL/min/{1.73_m2} (ref >59)
GLOBULIN, TOTAL: 2.5 g/dL (ref 1.5–4.5)
Glucose: 102 mg/dL — ABNORMAL HIGH (ref 65–99)
Potassium: 4.5 mmol/L (ref 3.5–5.2)
SODIUM: 141 mmol/L (ref 134–144)
Total Protein: 6.9 g/dL (ref 6.0–8.5)

## 2018-02-15 LAB — CBC WITH DIFFERENTIAL/PLATELET
BASOS: 1 %
Basophils Absolute: 0 10*3/uL (ref 0.0–0.2)
EOS (ABSOLUTE): 0.1 10*3/uL (ref 0.0–0.4)
EOS: 1 %
HEMATOCRIT: 39.4 % (ref 34.0–46.6)
Hemoglobin: 12.8 g/dL (ref 11.1–15.9)
Immature Grans (Abs): 0 10*3/uL (ref 0.0–0.1)
Immature Granulocytes: 0 %
LYMPHS ABS: 1.7 10*3/uL (ref 0.7–3.1)
Lymphs: 25 %
MCH: 30.9 pg (ref 26.6–33.0)
MCHC: 32.5 g/dL (ref 31.5–35.7)
MCV: 95 fL (ref 79–97)
Monocytes Absolute: 0.6 10*3/uL (ref 0.1–0.9)
Monocytes: 8 %
Neutrophils Absolute: 4.4 10*3/uL (ref 1.4–7.0)
Neutrophils: 65 %
PLATELETS: 228 10*3/uL (ref 150–450)
RBC: 4.14 x10E6/uL (ref 3.77–5.28)
RDW: 13.6 % (ref 12.3–15.4)
WBC: 6.8 10*3/uL (ref 3.4–10.8)

## 2018-02-15 LAB — HEMOGLOBIN A1C
ESTIMATED AVERAGE GLUCOSE: 111 mg/dL
Hgb A1c MFr Bld: 5.5 % (ref 4.8–5.6)

## 2018-02-15 LAB — TSH: TSH: 3.52 u[IU]/mL (ref 0.450–4.500)

## 2018-02-16 ENCOUNTER — Telehealth: Payer: Self-pay

## 2018-02-16 NOTE — Telephone Encounter (Signed)
-----   Message from Mar Daring, PA-C sent at 02/16/2018  9:34 AM EDT ----- Cholesterol improving from last year. Blood count normal. Kidney function normal. Sugar normal. Liver enzymes are still elevated but improving from last year. Thyroid normal.

## 2018-02-16 NOTE — Telephone Encounter (Signed)
Viewed by [name] on 02/16/2018 11:16 AM

## 2018-02-27 ENCOUNTER — Ambulatory Visit (INDEPENDENT_AMBULATORY_CARE_PROVIDER_SITE_OTHER): Payer: Medicare Other | Admitting: Cardiothoracic Surgery

## 2018-02-27 ENCOUNTER — Encounter: Payer: Self-pay | Admitting: Cardiothoracic Surgery

## 2018-02-27 VITALS — BP 150/79 | HR 91 | Temp 98.1°F | Resp 20 | Ht 64.0 in | Wt 245.6 lb

## 2018-02-27 DIAGNOSIS — R918 Other nonspecific abnormal finding of lung field: Secondary | ICD-10-CM | POA: Diagnosis not present

## 2018-02-27 NOTE — Patient Instructions (Signed)
Dr.Oaks will speak with the Radiologist tomorrow regarding your CT scan. Please call Dr.Oaks tomorrow, preferably mid morning. [phone].

## 2018-02-28 ENCOUNTER — Telehealth: Payer: Self-pay

## 2018-02-28 DIAGNOSIS — R911 Solitary pulmonary nodule: Secondary | ICD-10-CM

## 2018-02-28 NOTE — Telephone Encounter (Signed)
PFT's 03/08/18 @ 8:30 [facility]-arrive 8:15 to register. No caffiene, no inhailers.   PET scan - 03/06/18 @ 11:00 [facility]. Nothing to eat/drink 4 hours prior.  Dr.Oaks-03/12/18 @ 11:15.

## 2018-02-28 NOTE — Progress Notes (Signed)
Patient ID: Michelle Pitts, female   DOB: 01-Dec-1950, 67 y.o.   MRN: [id number]  Chief Complaint  Patient presents with  . 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 Loa Socks Reason for Referral left lower lobe mass  HPI Location, Quality, Duration, Severity, Timing, Context, Modifying Factors, Associated Signs and Symptoms.  Michelle Pitts is a 67 y.o. female.  Her problems began several years ago when she had a left breast carcinoma treated with surgery followed by radiation therapy.  She had a CT scan done back in 2015 as part of that original evaluation.  This revealed an ill-defined mass in the left lower lobe and subsequently has been followed for the last several years with annual CT scans.  She most recently had a CT scan performed several months ago which have revealed a increase in size of the left lower lobe mass from its original 11 mm to approximately 18 mm now.  She does have a history of smoking but quit in 2015.  She smoked for approximately 50 years.  She does complain of some shortness of breath with exertion but denies any fevers, chills or cough.  She does have a positive family history of carcinoma of the lung and a brother.  She has not had any further evaluation of the lung mass.  She has not undergone biopsy, PET scan or pulmonary function tests.   Past Medical History:  Diagnosis Date  . Anxiety   . Breast cancer ([place]) 01/23/2014   Left breast, T1c, N0, M0. ER/PR +; Her 2 neu not overexpressed. Oncotype DX: 13,Low risk.9% over 10 years.   . Cancer ([place]) 1995   vulvar, [facility]  . COPD (chronic obstructive pulmonary disease) (HCC)    bronchitis  . Dysrhythmia   . GERD (gastroesophageal reflux disease)   . History of colon polyps   . Hypercholesterolemia   . Hypertension   . Hypothyroidism   . Insomnia   . 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 Personal history of  radiation therapy   . Personal history of tobacco use, presenting hazards to health 11/18/2015  . Sleep apnea   . Sleep apnea   . SOB (shortness of breath) on exertion   . Spinal stenosis   . Thyroid disease   . Vitamin D deficiency     Past Surgical History:  Procedure Laterality Date  . BLADDER SUSPENSION  2001  . BREAST BIOPSY Right 1975, 1985   benign cyst [name]) and fibroadenoma([facility])  . BREAST BIOPSY Right 01/03/2014   Papilloma, no atypia detected on mammography.  Marland Kitchen BREAST BIOPSY Left 01/03/2014   Invasive mammary carcinoma.  Marland Kitchen BREAST BIOPSY Left 01/13/2015   ultrasound giuded biopsy, negative  . BREAST CYST ASPIRATION Left 1985  . BREAST LUMPECTOMY Left 01/23/2014   positive  . BREAST SURGERY Left 01/23/14   wide excision   . BREAST SURGERY  04/10/14   Debridement of fat necrosis  . CHOLECYSTECTOMY    . COLONOSCOPY  2014   Dr Tiffany Kocher  . COLONOSCOPY WITH PROPOFOL N/A 10/10/2016   Procedure: COLONOSCOPY WITH PROPOFOL;  Surgeon: Manya Silvas, MD;  Location: [facility] ENDOSCOPY;  Service: Endoscopy;  Laterality: N/A;  . ROTATOR CUFF REPAIR Right 1995  . VAGINAL HYSTERECTOMY  1978   partial  . wide excision debriedment Left 04/10/14    Family History  Problem Relation Age of Onset  . Diabetes Father   .  COPD Mother   . Heart failure Sister   . Emphysema Sister   . Diabetes Sister   . Hypertension Sister   . Emphysema Brother   . Lung cancer Brother   . Crohn's disease Sister   . Hypertension Sister   . Hypertension Sister   . Emphysema Brother   . Cancer Other        great great paternal aunt, ? age  . Cancer Maternal Grandfather 56       ? source  . Cancer Paternal Grandfather 5       ? source  . Breast cancer Neg Hx     Social History Social History   Tobacco Use  . Smoking status: Former Smoker    Packs/day: 1.00    Years: 50.00    Pack years: 50.00    Types: Cigarettes    Last attempt to quit: 09/15/2014    Years since quitting: 3.4  .  Smokeless tobacco: Never Used  Substance Use Topics  . Alcohol use: Yes    Alcohol/week: 8.4 oz    Types: 14 Cans of beer per week  . Drug use: No    Allergies  Allergen Reactions  . Codeine Nausea And Vomiting  . Hydrochlorothiazide Swelling    Swelling of tongue.  [place] [Levofloxacin In D5w] Hives and Swelling    Leg swelling Leg swelling    Current Outpatient Medications  Medication Sig Dispense Refill  . albuterol (PROAIR HFA) 108 (90 Base) MCG/ACT inhaler Inhale 2 puffs into the lungs every 6 (six) hours as needed for wheezing or shortness of breath. 18 g 3  . ALPRAZolam (XANAX) 0.5 MG tablet Take 1 tablet (0.5 mg total) by mouth 2 (two) times daily. As needed. 60 tablet 0  . aspirin EC 81 MG tablet Take 81 mg by mouth daily.    . cholecalciferol (VITAMIN D) 1000 units tablet Take 1 tablet (1,000 Units total) by mouth daily. 30 tablet 11  . Fluticasone-Salmeterol (ADVAIR) 250-50 MCG/DOSE AEPB Inhale 1 puff into the lungs 2 (two) times daily. 60 each 5  . metoprolol succinate (TOPROL-XL) 25 MG 24 hr tablet Take 1 tablet (25 mg total) by mouth daily. 90 tablet 1   No current facility-administered medications for this visit.       Review of Systems A complete review of systems was asked and was negative except for the following positive findings shortness of breath with exertion  Blood pressure (!) 150/79, pulse 91, temperature 98.1 F (36.7 C), temperature source Oral, resp. rate 20, height 5\' 4"  (1.626 m), weight 245 lb 9.6 oz (111.4 kg), SpO2 96 %.  Physical Exam CONSTITUTIONAL:  Pleasant, well-developed, well-nourished, and in no acute distress. EYES: Pupils equal and reactive to light, Sclera non-icteric EARS, NOSE, MOUTH AND THROAT:  The oropharynx was clear.  Dentition is good repair.  Oral mucosa pink and moist. LYMPH NODES:  Lymph nodes in the neck and axillae were normal RESPIRATORY:  Lungs were clear.  Normal respiratory effort without pathologic use of  accessory muscles of respiration CARDIOVASCULAR: Heart was regular without murmurs.  There were no carotid bruits. GI: The abdomen was soft, nontender, and nondistended. There were no palpable masses. There was no hepatosplenomegaly. There were normal bowel sounds in all quadrants. GU:  Rectal deferred.   MUSCULOSKELETAL:  Normal muscle strength and tone.  No clubbing or cyanosis.   SKIN:  There were no pathologic skin lesions.  There were no nodules on palpation. NEUROLOGIC:  Sensation  is normal.  Cranial nerves are grossly intact. PSYCH:  Oriented to person, place and time.  Mood and affect are normal.  Data Reviewed Serial CT scans of the chest  I have personally reviewed the patient's imaging, laboratory findings and medical records.    Assessment    There is a ill-defined groundglass left lower lobe lesion which measures approximately 17 to 18 mm and has increased in size over the last several years.  I believe that this most likely represents a carcinoma in situ.    Plan    I did discuss the results of her CT scans with our radiologist Dr. Marcello Moores register.  He agrees that the lesion in the left lower lobe is suspicious for a bronchoalveolar cell carcinoma.  I will obtain a PET scan and pulmonary function studies.  I will contact the patient with this information.  I will see her back again in 1 week.        Nestor Lewandowsky, MD 02/28/2018, 8:00 AM

## 2018-03-05 ENCOUNTER — Encounter: Payer: Self-pay | Admitting: Physician Assistant

## 2018-03-06 ENCOUNTER — Ambulatory Visit
Admission: RE | Admit: 2018-03-06 | Discharge: 2018-03-06 | Disposition: A | Discharge status: Home / Self Care | Payer: Medicare Other | Source: Ambulatory Visit | Attending: Cardiothoracic Surgery | Admitting: Cardiothoracic Surgery

## 2018-03-06 DIAGNOSIS — R911 Solitary pulmonary nodule: Secondary | ICD-10-CM | POA: Diagnosis present

## 2018-03-06 DIAGNOSIS — N9489 Other specified conditions associated with female genital organs and menstrual cycle: Secondary | ICD-10-CM | POA: Insufficient documentation

## 2018-03-06 LAB — GLUCOSE, CAPILLARY: GLUCOSE-CAPILLARY: 102 mg/dL — AB (ref 70–99)

## 2018-03-06 MED ORDER — FLUDEOXYGLUCOSE F - 18 (FDG) INJECTION
12.7000 | Freq: Once | INTRAVENOUS | Status: AC | PRN
  Administered 2018-03-06: 12.77 via INTRAVENOUS

## 2018-03-06 MED ORDER — FLUDEOXYGLUCOSE F - 18 (FDG) INJECTION: 12.7700 | Freq: Once | INTRAVENOUS | Status: DC | PRN

## 2018-03-08 ENCOUNTER — Ambulatory Visit: Payer: Medicare Other | Attending: Cardiothoracic Surgery

## 2018-03-08 DIAGNOSIS — R911 Solitary pulmonary nodule: Secondary | ICD-10-CM | POA: Diagnosis not present

## 2018-03-08 LAB — BLOOD GAS, ARTERIAL
Acid-Base Excess: 1.9 mmol/L (ref 0.0–2.0)
Bicarbonate: 26.6 mmol/L (ref 20.0–28.0)
FIO2: 0.21
O2 Saturation: 96.2 %
PCO2 ART: 41 mmHg (ref 32.0–48.0)
PH ART: 7.42 (ref 7.350–7.450)
PO2 ART: 82 mmHg — AB (ref 83.0–108.0)
Patient temperature: 37

## 2018-03-08 MED ORDER — ALBUTEROL SULFATE (2.5 MG/3ML) 0.083% IN NEBU
2.5000 mg | INHALATION_SOLUTION | Freq: Once | RESPIRATORY_TRACT | Status: AC
  Administered 2018-03-08: 2.5 mg via RESPIRATORY_TRACT
  Filled 2018-03-08: qty 3

## 2018-03-12 ENCOUNTER — Ambulatory Visit: Payer: Self-pay | Admitting: Cardiothoracic Surgery

## 2018-03-13 ENCOUNTER — Ambulatory Visit (INDEPENDENT_AMBULATORY_CARE_PROVIDER_SITE_OTHER): Payer: Medicare Other | Admitting: Cardiothoracic Surgery

## 2018-03-13 ENCOUNTER — Encounter: Payer: Self-pay | Admitting: Cardiothoracic Surgery

## 2018-03-13 VITALS — BP 169/106 | HR 96 | Temp 97.1°F | Wt 247.0 lb

## 2018-03-13 DIAGNOSIS — R911 Solitary pulmonary nodule: Secondary | ICD-10-CM

## 2018-03-13 NOTE — Patient Instructions (Signed)
We will refer you to see Dr. Rogue Bussing (Medical Oncologist) for a second opinion. The cancer center will call you with a date and time.

## 2018-03-13 NOTE — Progress Notes (Signed)
  Patient ID: Michelle Pitts, female   DOB: 20-Apr-1951, 67 y.o.   MRN: [id number]  HISTORY: Michelle Pitts returns today in follow-up.  She did have a PET scan done which did not reveal any evidence of uptake in the left lower lobe nodule.  We also obtain some pulmonary function studies which reveal an FEV1 of 60% of predicted and a DLCO of 66% of predicted.  She states that she does get short of breath with moderate activity.  She has smoked for many years but quit 3 years ago.  She states that she felt that she understood the directions for the pulmonary function test and that she believes they are accurate.   Vitals:   03/13/18 1349  BP: (!) 169/106  Pulse: 96  Temp: (!) 97.1 F (36.2 C)  SpO2: 96%     EXAM:    Resp: Lungs are clear bilaterally.  No respiratory distress, normal effort. Heart:  Regular without murmurs Abd:  Abdomen is soft, non distended and non tender. No masses are palpable.  There is no rebound and no guarding.  Neurological: Alert and oriented to person, place, and time. Coordination normal.  Skin: Skin is warm and dry. No rash noted. No diaphoretic. No erythema. No pallor.  Psychiatric: Normal mood and affect. Normal behavior. Judgment and thought content normal.    ASSESSMENT: I have independently reviewed the PET scan.  The nodule in the left lower lobe measures approximately 18 mm.  The recommendation from our radiologist was to continue surveillance.  There was no uptake on the PET scan in the left lower lobe nodule however a slow-growing adenocarcinoma was not excluded.   PLAN:   I had a long discussion with her today regarding the various options.  We discussed the role of continued surveillance versus biopsy.  I discussed with her the possibility of performing navigational bronchoscopy or percutaneous biopsy.  We also discussed the role of surgery.  Because the lesion is located more in the central portion of her lung I believe that she would require a  lobectomy for complete resection.  The patient is reluctant to pursue that given the fact that this is been relatively slow-growing over the years.  I suggested to her that she may wish to get another opinion with 1 of our pulmonologist.  I suggested that she see Dr. Christel Mormon or 1 of the other members of his group to discuss navigational bronchoscopy.  We also discussed what role oncology may play and she would like to meet with Dr. Lynett Fish just to discuss what other options there may be for her at this time.  I will continue to follow her when she meets with Dr. Lynett Fish.  She will call us and let us know how she would like to proceed.  I will also discuss her care today with Dr. Tollie Pizza who is been following her for her breast carcinoma.    Nestor Lewandowsky, MD

## 2018-03-19 ENCOUNTER — Inpatient Hospital Stay: Payer: Medicare Other | Attending: Internal Medicine | Admitting: Internal Medicine

## 2018-03-19 ENCOUNTER — Encounter: Payer: Self-pay | Admitting: *Deleted

## 2018-03-19 ENCOUNTER — Encounter: Payer: Self-pay | Admitting: Internal Medicine

## 2018-03-19 DIAGNOSIS — Z803 Family history of malignant neoplasm of breast: Secondary | ICD-10-CM | POA: Diagnosis not present

## 2018-03-19 DIAGNOSIS — Z801 Family history of malignant neoplasm of trachea, bronchus and lung: Secondary | ICD-10-CM | POA: Diagnosis not present

## 2018-03-19 DIAGNOSIS — E7800 Pure hypercholesterolemia, unspecified: Secondary | ICD-10-CM | POA: Diagnosis not present

## 2018-03-19 DIAGNOSIS — J449 Chronic obstructive pulmonary disease, unspecified: Secondary | ICD-10-CM | POA: Insufficient documentation

## 2018-03-19 DIAGNOSIS — E559 Vitamin D deficiency, unspecified: Secondary | ICD-10-CM | POA: Insufficient documentation

## 2018-03-19 DIAGNOSIS — R5383 Other fatigue: Secondary | ICD-10-CM | POA: Diagnosis not present

## 2018-03-19 DIAGNOSIS — G4700 Insomnia, unspecified: Secondary | ICD-10-CM | POA: Diagnosis not present

## 2018-03-19 DIAGNOSIS — R911 Solitary pulmonary nodule: Secondary | ICD-10-CM | POA: Insufficient documentation

## 2018-03-19 DIAGNOSIS — F419 Anxiety disorder, unspecified: Secondary | ICD-10-CM

## 2018-03-19 DIAGNOSIS — E039 Hypothyroidism, unspecified: Secondary | ICD-10-CM | POA: Insufficient documentation

## 2018-03-19 DIAGNOSIS — I10 Essential (primary) hypertension: Secondary | ICD-10-CM | POA: Diagnosis not present

## 2018-03-19 DIAGNOSIS — G4730 Sleep apnea, unspecified: Secondary | ICD-10-CM | POA: Diagnosis not present

## 2018-03-19 DIAGNOSIS — Z79899 Other long term (current) drug therapy: Secondary | ICD-10-CM | POA: Diagnosis not present

## 2018-03-19 DIAGNOSIS — Z809 Family history of malignant neoplasm, unspecified: Secondary | ICD-10-CM | POA: Diagnosis not present

## 2018-03-19 DIAGNOSIS — Z86010 Personal history of colonic polyps: Secondary | ICD-10-CM | POA: Diagnosis not present

## 2018-03-19 DIAGNOSIS — Z87891 Personal history of nicotine dependence: Secondary | ICD-10-CM | POA: Diagnosis not present

## 2018-03-19 DIAGNOSIS — Z853 Personal history of malignant neoplasm of breast: Secondary | ICD-10-CM | POA: Insufficient documentation

## 2018-03-19 DIAGNOSIS — R531 Weakness: Secondary | ICD-10-CM | POA: Insufficient documentation

## 2018-03-19 DIAGNOSIS — K219 Gastro-esophageal reflux disease without esophagitis: Secondary | ICD-10-CM

## 2018-03-19 DIAGNOSIS — Z7982 Long term (current) use of aspirin: Secondary | ICD-10-CM | POA: Diagnosis not present

## 2018-03-19 NOTE — Progress Notes (Signed)
  Oncology Nurse Navigator Documentation  Navigator Location: [facility]-Med Onc (03/19/18 1500) Referral date to RadOnc/MedOnc: 03/14/18 (03/19/18 1500) )Navigator Encounter Type: Initial MedOnc (03/19/18 1500)                         Barriers/Navigation Needs: Coordination of Care (03/19/18 1500)   Interventions: Coordination of Care (03/19/18 1500)   Coordination of Care: Appts (03/19/18 1500)        Acuity: Level 1 (03/19/18 1500) Acuity Level 1: Initial guidance, education and coordination as needed;Minimal follow up required (03/19/18 1500)      met with patient during initial med-onc consultation with Dr. Rogue Bussing to review recent imaging and discuss next steps. All questions answered at the time of visit. Informed pt that will be discussed at conference Thursday at lunch time. Pt will be notified by phone regarding next steps per recommendations from case conference. Contact info given and instructed to call with any further questions or needs. Pt verbalized understanding. Time Spent with Patient: 30 (03/19/18 1500)

## 2018-03-19 NOTE — Assessment & Plan Note (Addendum)
#  18 mm left lower lobe lung groundglass opacity incidentally noted PET scan shows no evidence of hypermetabolism.  Highly suspicious for slow-growing adenocarcinoma.  Recommend evaluation at the tumor conference.  #History of left-sided breast cancer; no chemo s/p RT; arimidex Dr.Byrnett [finished x3 years; ]  # COPD-moderate to severe Currently stable.  Thank you Dr.Oaks for allowing me to participate in the care of your pleasant patient. Please do not hesitate to contact me with questions or concerns in the interim.  # 45 minutes face-to-face with the patient discussing the above plan of care; more than 50% of time spent on prognosis/ natural history; counseling and coordination.  Addendum: Reviewed the tumor conference; will refer to pulmonary for ENB/tissue diagnosis.  Patient will follow-up with me after biopsy/review pathology.

## 2018-03-19 NOTE — Progress Notes (Signed)
Michelle Pitts NOTE  Patient Care Team: Rubye Beach as PCP - General (Family Medicine) Bary Castilla, Forest Gleason, MD (General Surgery) Vladimir Crofts, MD as Consulting Physician (Neurology) Telford Nab, RN as Registered Nurse  CHIEF COMPLAINTS/PURPOSE OF CONSULTATION:  Lung mass.  #  Oncology History   # LEFT LOWER LOBE GROUND GLASS OPACITY ~60mm/slowly growing; June 2019- PET- NO uptake;/Dr.Oaks;Dr.Kasa.   # LEFT BREAST CANCER Stage I; T1c N0;Low risk Oncotype- No chemo; [Dr.Byrnett; s/p AI x3 years; No chemo]  # COPD     Malignant neoplasm of lower-inner quadrant of left female breast ([place])     HISTORY OF PRESENTING ILLNESS:  Michelle Pitts 67 y.o.  female has been referred to [place] for further evaluation recommendations for left lower lobe ground glass opacity.  Patient has a prior history of early stage breast cancer for which she was treated with aromatase inhibitor.  Given her ongoing weight loss/given history of smoking she had a evaluation with a CT scan of the chest that showed left lower lobe lung nodule.  Patient quit smoking 2016.  Patient has 50-pack-year history of smoking.  Chronic shortness of breath chronic cough.  Otherwise no hemoptysis.  No bone pain.  Chronic mild joint pains.  Review of Systems  Constitutional: Positive for malaise/fatigue. Negative for chills, diaphoresis, fever and weight loss.  HENT: Negative for nosebleeds and sore throat.   Eyes: Negative for double vision.  Respiratory: Positive for cough and shortness of breath. Negative for hemoptysis, sputum production and wheezing.   Cardiovascular: Negative for chest pain, palpitations, orthopnea and leg swelling.  Gastrointestinal: Negative for abdominal pain, blood in stool, constipation, diarrhea, heartburn, melena, nausea and vomiting.  Genitourinary: Negative for dysuria, frequency and urgency.  Musculoskeletal: Positive for joint pain. Negative for back pain.   Skin: Negative.  Negative for itching and rash.  Neurological: Negative for dizziness, tingling, focal weakness, weakness and headaches.  Endo/Heme/Allergies: Does not bruise/bleed easily.  Psychiatric/Behavioral: Negative for depression. The patient is not nervous/anxious and does not have insomnia.      MEDICAL HISTORY:  Past Medical History:  Diagnosis Date  . Anxiety   . Breast cancer ([place]) 01/23/2014   Left breast, T1c, N0, M0. ER/PR +; Her 2 neu not overexpressed. Oncotype DX: 13,Low risk.9% over 10 years.   . Cancer ([place]) 1995   vulvar, [facility]  . COPD (chronic obstructive pulmonary disease) (HCC)    bronchitis  . Dysrhythmia   . GERD (gastroesophageal reflux disease)   . History of colon polyps   . Hypercholesterolemia   . Hypertension   . Hypothyroidism   . Insomnia   . 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 Personal history of radiation therapy   . Personal history of tobacco use, presenting hazards to health 11/18/2015  . Sleep apnea   . Sleep apnea   . SOB (shortness of breath) on exertion   . Spinal stenosis   . Thyroid disease   . Vitamin D deficiency     SURGICAL HISTORY: Past Surgical History:  Procedure Laterality Date  . BLADDER SUSPENSION  2001  . BREAST BIOPSY Right 1975, 1985   benign cyst [name]) and fibroadenoma([facility])  . BREAST BIOPSY Right 01/03/2014   Papilloma, no atypia detected on mammography.  Marland Kitchen BREAST BIOPSY Left 01/03/2014   Invasive mammary carcinoma.  Marland Kitchen BREAST BIOPSY Left 01/13/2015   ultrasound giuded biopsy, negative  . BREAST CYST ASPIRATION Left 1985  . BREAST LUMPECTOMY Left 01/23/2014  positive  . BREAST SURGERY Left 01/23/14   wide excision   . BREAST SURGERY  04/10/14   Debridement of fat necrosis  . CHOLECYSTECTOMY    . COLONOSCOPY  2014   Dr Tiffany Kocher  . COLONOSCOPY WITH PROPOFOL N/A 10/10/2016   Procedure: COLONOSCOPY WITH PROPOFOL;  Surgeon: Manya Silvas, MD;  Location: [facility] ENDOSCOPY;  Service: Endoscopy;   Laterality: N/A;  . ROTATOR CUFF REPAIR Right 1995  . VAGINAL HYSTERECTOMY  1978   partial  . VULVECTOMY PARTIAL  1995  . wide excision debriedment Left 04/10/14    SOCIAL HISTORY: 50-pack-year history of smoking.  Quit 2016.  No alcohol abuse. Social History   Socioeconomic History  . Marital status: Divorced    Spouse name: Not on file  . Number of children: 2  . Years of education: Not on file  . Highest education level: Some college, no degree  Occupational History  . Occupation: retired  Scientific laboratory technician  . Financial resource strain: Not hard at all  . Food insecurity:    Worry: Never true    Inability: Never true  . Transportation needs:    Medical: No    Non-medical: No  Tobacco Use  . Smoking status: Former Smoker    Packs/day: 1.00    Years: 50.00    Pack years: 50.00    Types: Cigarettes    Last attempt to quit: 09/15/2014    Years since quitting: 3.5  . Smokeless tobacco: Never Used  Substance and Sexual Activity  . Alcohol use: Yes    Alcohol/week: 14.0 standard drinks    Types: 14 Cans of beer per week  . Drug use: No  . Sexual activity: Not Currently  Lifestyle  . Physical activity:    Days per week: Not on file    Minutes per session: Not on file  . Stress: Not at all  Relationships  . Social connections:    Talks on phone: Not on file    Gets together: Not on file    Attends religious service: Not on file    Active member of club or organization: Not on file    Attends meetings of clubs or organizations: Not on file    Relationship status: Not on file  . Intimate partner violence:    Fear of current or ex partner: Not on file    Emotionally abused: Not on file    Physically abused: Not on file    Forced sexual activity: Not on file  Other Topics Concern  . Not on file  Social History Narrative  . Not on file    FAMILY HISTORY: Family History  Problem Relation Age of Onset  . Diabetes Father   . COPD Mother   . Heart failure Sister   .  Emphysema Sister   . Diabetes Sister   . Hypertension Sister   . Emphysema Brother   . Lung cancer Brother   . Crohn's disease Sister   . Hypertension Sister   . Hypertension Sister   . Emphysema Brother   . Cancer Other        great great paternal aunt, ? age  . Cancer Maternal Grandfather 82       ? source  . Cancer Paternal Grandfather 20       ? source  . Breast cancer Neg Hx     ALLERGIES:  is allergic to codeine; hydrochlorothiazide; and levaquin [levofloxacin in d5w].  MEDICATIONS:  Current Outpatient Medications  Medication Sig  Dispense Refill  . albuterol (PROAIR HFA) 108 (90 Base) MCG/ACT inhaler Inhale 2 puffs into the lungs every 6 (six) hours as needed for wheezing or shortness of breath. 18 g 3  . ALPRAZolam (XANAX) 0.5 MG tablet Take 1 tablet (0.5 mg total) by mouth 2 (two) times daily. As needed. 60 tablet 0  . aspirin EC 81 MG tablet Take 81 mg by mouth daily.    . cholecalciferol (VITAMIN D) 1000 units tablet Take 1 tablet (1,000 Units total) by mouth daily. 30 tablet 11  . Fluticasone-Salmeterol (ADVAIR) 250-50 MCG/DOSE AEPB Inhale 1 puff into the lungs 2 (two) times daily. (Patient not taking: Reported on 04/05/2018) 60 each 5  . metoprolol succinate (TOPROL-XL) 25 MG 24 hr tablet Take 1 tablet (25 mg total) by mouth daily. 90 tablet 1  . fluticasone (FLONASE) 50 MCG/ACT nasal spray Place 2 sprays into both nostrils 2 (two) times daily as needed for allergies or rhinitis.    Marland Kitchen ketotifen (ZADITOR) 0.025 % ophthalmic solution Place 1 drop into both eyes 2 (two) times daily as needed.    . naproxen sodium (ALEVE) 220 MG tablet Take 220 mg by mouth daily as needed.    . ranitidine (ZANTAC) 150 MG tablet Take 150 mg by mouth 2 (two) times daily.     No current facility-administered medications for this visit.       Marland Kitchen  PHYSICAL EXAMINATION: ECOG PERFORMANCE STATUS: 0 - Asymptomatic  Vitals:   03/19/18 1514  BP: (!) 156/82  Pulse: 83  Resp: 16  Temp: (!)  97.1 F (36.2 C)   There were no vitals filed for this visit.  Physical Exam  Constitutional: She is oriented to person, place, and time and well-developed, well-nourished, and in no distress.  HENT:  Head: Normocephalic and atraumatic.  Mouth/Throat: Oropharynx is clear and moist. No oropharyngeal exudate.  Eyes: Pupils are equal, round, and reactive to light.  Neck: Normal range of motion. Neck supple.  Cardiovascular: Normal rate and regular rhythm.  Pulmonary/Chest: No respiratory distress. She has no wheezes.  Decreased bilateral air entry.  Abdominal: Soft. Bowel sounds are normal. She exhibits no distension and no mass. There is no tenderness. There is no rebound and no guarding.  Musculoskeletal: Normal range of motion. She exhibits no edema or tenderness.  Neurological: She is alert and oriented to person, place, and time.  Skin: Skin is warm.  Psychiatric: Affect normal.     LABORATORY DATA:  I have reviewed the data as listed Lab Results  Component Value Date   WBC 5.6 04/05/2018   HGB 13.5 04/05/2018   HCT 39.2 04/05/2018   MCV 93.2 04/05/2018   PLT 220 04/05/2018   Recent Labs    02/14/18 1512 04/05/18 0951  NA 141 142  K 4.5 4.6  CL 103 107  CO2 23 29  GLUCOSE 102* 104*  BUN 19 15  CREATININE 0.80 0.68  CALCIUM 10.2 9.6  GFRNONAA 76 >60  GFRAA 88 >60  PROT 6.9  --   ALBUMIN 4.4  --   AST 28  --   ALT 41*  --   ALKPHOS 134*  --   BILITOT 0.2  --     RADIOGRAPHIC STUDIES: I have personally reviewed the radiological images as listed and agreed with the findings in the report. No results found.  ASSESSMENT & PLAN:   Left lower lobe pulmonary nodule #18 mm left lower lobe lung groundglass opacity incidentally noted PET scan shows no evidence  of hypermetabolism.  Highly suspicious for slow-growing adenocarcinoma.  Recommend evaluation at the tumor conference.  #History of left-sided breast cancer; no chemo s/p RT; arimidex Dr.Byrnett [finished  x3 years; ]  # COPD-moderate to severe Currently stable.  Thank you Dr.Oaks for allowing me to participate in the care of your pleasant patient. Please do not hesitate to contact me with questions or concerns in the interim.  # 45 minutes face-to-face with the patient discussing the above plan of care; more than 50% of time spent on prognosis/ natural history; counseling and coordination.  Addendum: Reviewed the tumor conference; will refer to pulmonary for ENB/tissue diagnosis.  Patient will follow-up with me after biopsy/review pathology.   All questions were answered. The patient knows to call the clinic with any problems, questions or concerns.    Cammie Sickle, MD 04/09/2018 10:34 PM

## 2018-03-22 ENCOUNTER — Telehealth: Payer: Self-pay | Admitting: *Deleted

## 2018-03-22 DIAGNOSIS — R911 Solitary pulmonary nodule: Secondary | ICD-10-CM

## 2018-03-22 NOTE — Telephone Encounter (Signed)
Pt's case was discussed at multidisciplinary tumor board today. Imaging reviewed and recommendations were: proceed with ENB to obtain tissue diagnosis vs. surgical resection vs SBRT with Dr. Baruch Gouty. Recommendations discussed with patient and at this time patient would like to proceed with trying to obtain tissue diagnosis by ENB approach. Pt informed that referral will be placed today and that their office will contact her with her appt. Informed that Dr. Rogue Bussing will follow up with her to review results few days after bronchoscopy. No further questions at this time. Pt verbalized understanding.

## 2018-03-28 ENCOUNTER — Ambulatory Visit: Payer: Medicare Other | Admitting: Internal Medicine

## 2018-03-28 ENCOUNTER — Encounter: Payer: Self-pay | Admitting: Internal Medicine

## 2018-03-28 VITALS — BP 138/84 | HR 84 | Ht 64.0 in | Wt 246.0 lb

## 2018-03-28 DIAGNOSIS — R911 Solitary pulmonary nodule: Secondary | ICD-10-CM

## 2018-03-28 NOTE — H&P (View-Only) (Signed)
Name: Michelle Pitts MRN: [id number] DOB: [birth year]     CONSULTATION DATE: 03/28/2018 REFERRING MD : Tollie Pizza  CHIEF COMPLAINT: Abnormal CT chest findings  STUDIES:   11/2017   CT chest Independently reviewed by Me Left lower lobe lung nodule groundglass opacification 12.1 mm to 15 mm to 16.6 mm progressive Truman Hayward growing over the last 2 years   HISTORY OF PRESENT ILLNESS: 67 year old female seen today for abnormal CT chest findings  Patient has a left lower lobe lung nodule groundglass opacification that has been slowly growing over the past several years  CT chest findings as noted above  Patient was discussed with tumor board and oncology conference and according to her pulmonary function testing which were obtained on 03/08/2018 patient has moderate to severe COPD based on FEV1  She was evaluated by Dr. Faith Rogue and Dr.Munday and they referred to [place] for left lower lobe lung nodule sampling via ENB  No signs of infection at this time No signs of heart failure at this time  Patient has a chronic shortness of breath but no acute distress at this time No evidence of fevers chills nausea vomiting diarrhea      CT scan imaging reviewed with patient  We will plan for electromagnetic navigational bronchoscopy  The Risks and Benefits of the Bronchoscopy procedure with ENB were explained to patient  I have discussed the risk for Acute Bleeding, increased chance of Infection, increased chance of Respiratory Failure and Cardiac Arrest, Stroke and Death.  I have also explained to avoid all types of NSAIDs 1 week prior to procedure date  to decrease chance of bleeding, and also to avoid food and drinks the midnight prior to procedure.  The procedure consists of a video camera with a light source to be placed and inserted  into the lungs to  look for abnormal tissue and to obtain tissue samples by using needle and biopsy tools.  The patient/family understand the risks and benefits  and have agreed to proceed with procedure.  PAST MEDICAL HISTORY :   has a past medical history of Anxiety, Breast cancer ([place]) (01/23/2014), Cancer ([place]) (1995), COPD (chronic obstructive pulmonary disease) ([place]), Dysrhythmia, GERD (gastroesophageal reflux disease), History of colon polyps, Hypercholesterolemia, Hypertension, Hypothyroidism, Insomnia, Papilloma of breast (01/09/2014), Personal history of radiation therapy, Personal history of tobacco use, presenting hazards to health (11/18/2015), Sleep apnea, Sleep apnea, SOB (shortness of breath) on exertion, Spinal stenosis, Thyroid disease, and Vitamin D deficiency.  has a past surgical history that includes Cholecystectomy; Bladder suspension (2001); Rotator cuff repair (Right, 1995); wide excision debriedment (Left, 04/10/14); Vaginal hysterectomy (1978); Colonoscopy (2014); Breast surgery (Left, 01/23/14); Breast surgery (04/10/14); Breast lumpectomy (Left, 01/23/2014); Colonoscopy with propofol (N/A, 10/10/2016); Breast cyst aspiration (Left, 1985); Breast biopsy (Right, 1975, 1985); Breast biopsy (Right, 01/03/2014); Breast biopsy (Left, 01/03/2014); and Breast biopsy (Left, 01/13/2015). Prior to Admission medications   Medication Sig Start Date End Date Taking? Authorizing Provider  albuterol (PROAIR HFA) 108 (90 Base) MCG/ACT inhaler Inhale 2 puffs into the lungs every 6 (six) hours as needed for wheezing or shortness of breath. 03/21/17  Yes Mar Daring, PA-C  ALPRAZolam Duanne Moron) 0.5 MG tablet Take 1 tablet (0.5 mg total) by mouth 2 (two) times daily. As needed. 11/30/17  Yes Mar Daring, PA-C  aspirin EC 81 MG tablet Take 81 mg by mouth daily.   Yes [provider]  cholecalciferol (VITAMIN D) 1000 units tablet Take 1 tablet (1,000 Units total) by mouth daily. 02/10/16  Yes Margarita Rana, MD  Fluticasone-Salmeterol (ADVAIR) 250-50 MCG/DOSE AEPB Inhale 1 puff into the lungs 2 (two) times daily. 08/02/17  Yes Mar Daring, PA-C  metoprolol succinate (TOPROL-XL) 25 MG 24 hr tablet Take 1 tablet (25 mg total) by mouth daily. 10/04/17  Yes Mar Daring, PA-C   Allergies  Allergen Reactions  . Codeine Nausea And Vomiting  . Hydrochlorothiazide Swelling    Swelling of tongue.  Mack Hook [Levofloxacin In D5w] Hives and Swelling    Leg swelling Leg swelling    FAMILY HISTORY:  family history includes COPD in her mother; Cancer in her other; Cancer (age of onset: 26) in her maternal grandfather; Cancer (age of onset: 56) in her paternal grandfather; Crohn's disease in her sister; Diabetes in her father and sister; Emphysema in her brother, brother, and sister; Heart failure in her sister; Hypertension in her sister, sister, and sister; Lung cancer in her brother. SOCIAL HISTORY:  reports that she quit smoking about 3 years ago. Her smoking use included cigarettes. She has a 50.00 pack-year smoking history. She has never used smokeless tobacco. She reports that she drinks about 14.0 standard drinks of alcohol per week. She reports that she does not use drugs.  REVIEW OF SYSTEMS:   Constitutional: Negative for fever, chills, weight loss, malaise/fatigue and diaphoresis.  HENT: Negative for hearing loss, ear pain, nosebleeds, congestion, sore throat, neck pain, tinnitus and ear discharge.   Eyes: Negative for blurred vision, double vision, photophobia, pain, discharge and redness.  Respiratory: Negative for cough, hemoptysis, sputum production, shortness of breath, wheezing and stridor.   Cardiovascular: Negative for chest pain, palpitations, orthopnea, claudication, leg swelling and PND.  Gastrointestinal: Negative for heartburn, nausea, vomiting, abdominal pain, diarrhea, constipation, blood in stool and melena.  Genitourinary: Negative for dysuria, urgency, frequency, hematuria and flank pain.  Musculoskeletal: Negative for myalgias, back pain, joint pain and falls.  Skin: Negative for itching and rash.    Neurological: Negative for dizziness, tingling, tremors, sensory change, speech change, focal weakness, seizures, loss of consciousness, weakness and headaches.  Endo/Heme/Allergies: Negative for environmental allergies and polydipsia. Does not bruise/bleed easily.  ALL OTHER ROS ARE NEGATIVE   BP 138/84 (BP Location: Left Arm, Cuff Size: Normal)   Pulse 84   Ht 5\' 4"  (1.626 m)   Wt 246 lb (111.6 kg)   SpO2 97%   BMI 42.23 kg/m   Physical Examination:   GENERAL:NAD, no fevers, chills, no weakness no fatigue HEAD: Normocephalic, atraumatic.  EYES: Pupils equal, round, reactive to light. Extraocular muscles intact. No scleral icterus.  MOUTH: Moist mucosal membrane.   EAR, NOSE, THROAT: Clear without exudates. No external lesions.  NECK: Supple. No thyromegaly. No nodules. No JVD.  PULMONARY:CTA B/L no wheezes, no crackles, no rhonchi CARDIOVASCULAR: S1 and S2. Regular rate and rhythm. No murmurs, rubs, or gallops. No edema.  GASTROINTESTINAL: Soft, nontender, nondistended. No masses. Positive bowel sounds.  MUSCULOSKELETAL: No swelling, clubbing, or edema. Range of motion full in all extremities.  NEUROLOGIC: Cranial nerves II through XII are intact. No gross focal neurological deficits.  SKIN: No ulceration, lesions, rashes, or cyanosis. Skin warm and dry. Turgor intact.  PSYCHIATRIC: Mood, affect within normal limits. The patient is awake, alert and oriented x 3. Insight, judgment intact.      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 She agrees to undergo  procedure   Patient/Family are satisfied with Plan of action and management. All questions answered  Corrin Parker, M.D.  [facility]  Medical Director [place] Director [facility] Cardio-Pulmonary Department

## 2018-03-28 NOTE — Patient Instructions (Signed)
Plan for ENB case    The Risks and Benefits of the Bronchoscopy procedure with ENB were explained to patient/family.  I have discussed the risk for Acute Bleeding, increased chance of Infection, increased chance of Respiratory Failure and Cardiac Arrest, Stroke and Death.  I have also explained to avoid all types of NSAIDs 1 week prior to procedure date  to decrease chance of bleeding, and also to avoid food and drinks the midnight prior to procedure.  The procedure consists of a video camera with a light source to be placed and inserted  into the lungs to  look for abnormal tissue and to obtain tissue samples by using needle and biopsy tools.  The patient/family understand the risks and benefits and have agreed to proceed with procedure.

## 2018-03-28 NOTE — Progress Notes (Signed)
Name: Michelle Pitts MRN: [id number] DOB: November 21, 1950     CONSULTATION DATE: 03/28/2018 REFERRING MD : Tollie Pizza  CHIEF COMPLAINT: Abnormal CT chest findings  STUDIES:   11/2017   CT chest Independently reviewed by Me Left lower lobe lung nodule groundglass opacification 12.1 mm to 15 mm to 16.6 mm progressive Truman Hayward growing over the last 2 years   HISTORY OF PRESENT ILLNESS: 67 year old female seen today for abnormal CT chest findings  Patient has a left lower lobe lung nodule groundglass opacification that has been slowly growing over the past several years  CT chest findings as noted above  Patient was discussed with tumor board and oncology conference and according to her pulmonary function testing which were obtained on 03/08/2018 patient has moderate to severe COPD based on FEV1  She was evaluated by Dr. Faith Rogue and Dr.Munday and they referred to [place] for left lower lobe lung nodule sampling via ENB  No signs of infection at this time No signs of heart failure at this time  Patient has a chronic shortness of breath but no acute distress at this time No evidence of fevers chills nausea vomiting diarrhea      CT scan imaging reviewed with patient  We will plan for electromagnetic navigational bronchoscopy  The Risks and Benefits of the Bronchoscopy procedure with ENB were explained to patient  I have discussed the risk for Acute Bleeding, increased chance of Infection, increased chance of Respiratory Failure and Cardiac Arrest, Stroke and Death.  I have also explained to avoid all types of NSAIDs 1 week prior to procedure date  to decrease chance of bleeding, and also to avoid food and drinks the midnight prior to procedure.  The procedure consists of a video camera with a light source to be placed and inserted  into the lungs to  look for abnormal tissue and to obtain tissue samples by using needle and biopsy tools.  The patient/family understand the risks and benefits  and have agreed to proceed with procedure.  PAST MEDICAL HISTORY :   has a past medical history of Anxiety, Breast cancer ([place]) (01/23/2014), Cancer ([place]) (1995), COPD (chronic obstructive pulmonary disease) ([place]), Dysrhythmia, GERD (gastroesophageal reflux disease), History of colon polyps, Hypercholesterolemia, Hypertension, Hypothyroidism, Insomnia, Papilloma of breast (01/09/2014), Personal history of radiation therapy, Personal history of tobacco use, presenting hazards to health (11/18/2015), Sleep apnea, Sleep apnea, SOB (shortness of breath) on exertion, Spinal stenosis, Thyroid disease, and Vitamin D deficiency.  has a past surgical history that includes Cholecystectomy; Bladder suspension (2001); Rotator cuff repair (Right, 1995); wide excision debriedment (Left, 04/10/14); Vaginal hysterectomy (1978); Colonoscopy (2014); Breast surgery (Left, 01/23/14); Breast surgery (04/10/14); Breast lumpectomy (Left, 01/23/2014); Colonoscopy with propofol (N/A, 10/10/2016); Breast cyst aspiration (Left, 1985); Breast biopsy (Right, 1975, 1985); Breast biopsy (Right, 01/03/2014); Breast biopsy (Left, 01/03/2014); and Breast biopsy (Left, 01/13/2015). Prior to Admission medications   Medication Sig Start Date End Date Taking? Authorizing Provider  albuterol (PROAIR HFA) 108 (90 Base) MCG/ACT inhaler Inhale 2 puffs into the lungs every 6 (six) hours as needed for wheezing or shortness of breath. 03/21/17  Yes Mar Daring, PA-C  ALPRAZolam Duanne Moron) 0.5 MG tablet Take 1 tablet (0.5 mg total) by mouth 2 (two) times daily. As needed. 11/30/17  Yes Mar Daring, PA-C  aspirin EC 81 MG tablet Take 81 mg by mouth daily.   Yes [provider]  cholecalciferol (VITAMIN D) 1000 units tablet Take 1 tablet (1,000 Units total) by mouth daily. 02/10/16  Yes Margarita Rana, MD  Fluticasone-Salmeterol (ADVAIR) 250-50 MCG/DOSE AEPB Inhale 1 puff into the lungs 2 (two) times daily. 08/02/17  Yes Mar Daring, PA-C  metoprolol succinate (TOPROL-XL) 25 MG 24 hr tablet Take 1 tablet (25 mg total) by mouth daily. 10/04/17  Yes Mar Daring, PA-C   Allergies  Allergen Reactions  . Codeine Nausea And Vomiting  . Hydrochlorothiazide Swelling    Swelling of tongue.  Mack Hook [Levofloxacin In D5w] Hives and Swelling    Leg swelling Leg swelling    FAMILY HISTORY:  family history includes COPD in her mother; Cancer in her other; Cancer (age of onset: 63) in her maternal grandfather; Cancer (age of onset: 7) in her paternal grandfather; Crohn's disease in her sister; Diabetes in her father and sister; Emphysema in her brother, brother, and sister; Heart failure in her sister; Hypertension in her sister, sister, and sister; Lung cancer in her brother. SOCIAL HISTORY:  reports that she quit smoking about 3 years ago. Her smoking use included cigarettes. She has a 50.00 pack-year smoking history. She has never used smokeless tobacco. She reports that she drinks about 14.0 standard drinks of alcohol per week. She reports that she does not use drugs.  REVIEW OF SYSTEMS:   Constitutional: Negative for fever, chills, weight loss, malaise/fatigue and diaphoresis.  HENT: Negative for hearing loss, ear pain, nosebleeds, congestion, sore throat, neck pain, tinnitus and ear discharge.   Eyes: Negative for blurred vision, double vision, photophobia, pain, discharge and redness.  Respiratory: Negative for cough, hemoptysis, sputum production, shortness of breath, wheezing and stridor.   Cardiovascular: Negative for chest pain, palpitations, orthopnea, claudication, leg swelling and PND.  Gastrointestinal: Negative for heartburn, nausea, vomiting, abdominal pain, diarrhea, constipation, blood in stool and melena.  Genitourinary: Negative for dysuria, urgency, frequency, hematuria and flank pain.  Musculoskeletal: Negative for myalgias, back pain, joint pain and falls.  Skin: Negative for itching and rash.    Neurological: Negative for dizziness, tingling, tremors, sensory change, speech change, focal weakness, seizures, loss of consciousness, weakness and headaches.  Endo/Heme/Allergies: Negative for environmental allergies and polydipsia. Does not bruise/bleed easily.  ALL OTHER ROS ARE NEGATIVE   BP 138/84 (BP Location: Left Arm, Cuff Size: Normal)   Pulse 84   Ht 5\' 4"  (1.626 m)   Wt 246 lb (111.6 kg)   SpO2 97%   BMI 42.23 kg/m   Physical Examination:   GENERAL:NAD, no fevers, chills, no weakness no fatigue HEAD: Normocephalic, atraumatic.  EYES: Pupils equal, round, reactive to light. Extraocular muscles intact. No scleral icterus.  MOUTH: Moist mucosal membrane.   EAR, NOSE, THROAT: Clear without exudates. No external lesions.  NECK: Supple. No thyromegaly. No nodules. No JVD.  PULMONARY:CTA B/L no wheezes, no crackles, no rhonchi CARDIOVASCULAR: S1 and S2. Regular rate and rhythm. No murmurs, rubs, or gallops. No edema.  GASTROINTESTINAL: Soft, nontender, nondistended. No masses. Positive bowel sounds.  MUSCULOSKELETAL: No swelling, clubbing, or edema. Range of motion full in all extremities.  NEUROLOGIC: Cranial nerves II through XII are intact. No gross focal neurological deficits.  SKIN: No ulceration, lesions, rashes, or cyanosis. Skin warm and dry. Turgor intact.  PSYCHIATRIC: Mood, affect within normal limits. The patient is awake, alert and oriented x 3. Insight, judgment intact.      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 She agrees to undergo  procedure   Patient/Family are satisfied with Plan of action and management. All questions answered  Corrin Parker, M.D.  [facility]  Medical Director [place] Director [facility] Cardio-Pulmonary Department

## 2018-03-29 ENCOUNTER — Other Ambulatory Visit: Payer: Self-pay | Admitting: *Deleted

## 2018-03-29 ENCOUNTER — Telehealth: Payer: Self-pay | Admitting: *Deleted

## 2018-03-29 DIAGNOSIS — R911 Solitary pulmonary nodule: Secondary | ICD-10-CM

## 2018-03-29 NOTE — Telephone Encounter (Signed)
Pt informed of PAT and bronch dates and times.   PROVIDER: Kasa PROCEDURE: ENB with Super D CT prior (CT scheduled) DATE: 04/12/18 TIME: 1pm KD:PTEL nodule

## 2018-03-30 NOTE — Telephone Encounter (Signed)
Called and spoke with Maudie Mercury at [facility]. Procedure code [phone] (ENB) is a valid and billable code which does not require authorization as long as procedure is done outpatient. Call Ref # [id number].  Rhonda J Cobb

## 2018-04-05 ENCOUNTER — Encounter
Admission: RE | Admit: 2018-04-05 | Discharge: 2018-04-05 | Disposition: A | Discharge status: Home / Self Care | Payer: Medicare Other | Source: Ambulatory Visit | Attending: Internal Medicine | Admitting: Internal Medicine

## 2018-04-05 ENCOUNTER — Other Ambulatory Visit: Payer: Self-pay

## 2018-04-05 DIAGNOSIS — I10 Essential (primary) hypertension: Secondary | ICD-10-CM | POA: Insufficient documentation

## 2018-04-05 DIAGNOSIS — Z01812 Encounter for preprocedural laboratory examination: Secondary | ICD-10-CM | POA: Insufficient documentation

## 2018-04-05 DIAGNOSIS — Z01810 Encounter for preprocedural cardiovascular examination: Secondary | ICD-10-CM | POA: Insufficient documentation

## 2018-04-05 LAB — BASIC METABOLIC PANEL
Anion gap: 6 (ref 5–15)
BUN: 15 mg/dL (ref 8–23)
CHLORIDE: 107 mmol/L (ref 98–111)
CO2: 29 mmol/L (ref 22–32)
CREATININE: 0.68 mg/dL (ref 0.44–1.00)
Calcium: 9.6 mg/dL (ref 8.9–10.3)
GFR calc Af Amer: >60 mL/min (ref >60)
GFR calc non Af Amer: >60 mL/min (ref >60)
Glucose, Bld: 104 mg/dL — ABNORMAL HIGH (ref 70–99)
Potassium: 4.6 mmol/L (ref 3.5–5.1)
Sodium: 142 mmol/L (ref 135–145)

## 2018-04-05 LAB — CBC
HEMATOCRIT: 39.2 % (ref 35.0–47.0)
HEMOGLOBIN: 13.5 g/dL (ref 12.0–16.0)
MCH: 32.1 pg (ref 26.0–34.0)
MCHC: 34.4 g/dL (ref 32.0–36.0)
MCV: 93.2 fL (ref 80.0–100.0)
Platelets: 220 10*3/uL (ref 150–440)
RBC: 4.2 MIL/uL (ref 3.80–5.20)
RDW: 13.3 % (ref 11.5–14.5)
WBC: 5.6 10*3/uL (ref 3.6–11.0)

## 2018-04-05 NOTE — Patient Instructions (Addendum)
Your procedure is scheduled on: Thursday 04/12/18  Report to [facility]. To find out your arrival time please call [phone] between 1PM - 3PM on Wednesday 04/11/18  Remember: Instructions that are not followed completely may result in serious medical risk, up to and including death, or upon the discretion of your surgeon and anesthesiologist your surgery may need to be rescheduled.     _X__ 1. Do not eat food after midnight the night before your procedure.                 No gum chewing or hard candies. You may drink clear liquids up to 2 hours                 before you are scheduled to arrive for your surgery- DO not drink clear                 liquids within 2 hours of the start of your surgery.                 Clear Liquids include:  water, apple juice without pulp, clear carbohydrate                 drink such as Clearfast or Gatorade, Black Coffee or Tea (Do not add                 anything to coffee or tea).  __X__2.  On the morning of surgery brush your teeth with toothpaste and water, you may rinse your mouth with mouthwash if you wish.  Do not swallow any             toothpaste of mouthwash.     _X__ 3.  No Alcohol for 24 hours before or after surgery.   _X__ 4.  Do Not Smoke or use e-cigarettes For 24 Hours Prior to Your Surgery.                 Do not use any chewable tobacco products for at least 6 hours prior to                 surgery.  ____  5.  Bring all medications with you on the day of surgery if instructed.   __X__  6.  Notify your doctor if there is any change in your medical condition      (cold, fever, infections).     Do not wear jewelry, make-up, hairpins, clips or nail polish. Do not wear lotions, powders, or perfumes.  Do not shave 48 hours prior to surgery. Men may shave face and neck. Do not bring valuables to the hospital.    [facility] is not responsible for any belongings or  valuables.  Contacts, dentures/partials or body piercings may not be worn into surgery. Bring a case for your contacts, glasses or hearing aids, a denture cup will be supplied. Leave your suitcase in the car. After surgery it may be brought to your room. For patients admitted to the hospital, discharge time is determined by your treatment team.   Patients discharged the day of surgery will not be allowed to drive home.   Please read over the following fact sheets that you were given:   MRSA Information  __X__ Take these medicines the morning of surgery with A SIP OF WATER:     1. albuterol (PROAIR HFA) 108 (90 Base) MCG/ACT inhaler  2. fluticasone (FLONASE) 50 MCG/ACT  nasal spray  3. metoprolol succinate (TOPROL-XL) 25 MG 24 hr tablet  4. ranitidine (ZANTAC) 150 MG tablet  5.  6.     _ X___ Use inhalers on the day of surgery. Also bring the inhaler with you to the hospital on the morning of surgery.     __X__ Stop Blood Thinners Coumadin/Plavix/Xarelto/Pleta/Pradaxa/Eliquis/Effient/Aspirin   __X__ Stop Anti-inflammatories TODAY such as Advil, Ibuprofen, Motrin, BC or Goodies Powder, Naprosyn, Naproxen, Aleve, Aspirin, Meloxicam. May take Tylenol if needed for pain or discomfort.   __X__ Stop all herbal supplements, fish oil or vitamin E until after surgery.

## 2018-04-10 ENCOUNTER — Telehealth: Payer: Self-pay | Admitting: *Deleted

## 2018-04-10 NOTE — Telephone Encounter (Signed)
ENB [phone] DX: lung nodule 04/12/18 Dr. Mortimer Fries

## 2018-04-11 NOTE — Telephone Encounter (Signed)
04/11/18 at 11:00 am EST spoke with LJ at [facility]. Procedure code [phone] is a valid and billable code which does not require PA. Call Ref # [id number]. Rhonda J Cobb

## 2018-04-12 ENCOUNTER — Encounter
Admission: RE | Disposition: A | Discharge status: Home / Self Care | Payer: Self-pay | Source: Ambulatory Visit | Attending: Internal Medicine

## 2018-04-12 ENCOUNTER — Ambulatory Visit: Payer: Medicare Other

## 2018-04-12 ENCOUNTER — Other Ambulatory Visit: Payer: Self-pay

## 2018-04-12 ENCOUNTER — Inpatient Hospital Stay
Admission: RE | Admit: 2018-04-12 | Discharge: 2018-04-16 | DRG: 166 | Disposition: A | Discharge status: Home / Self Care | Payer: Medicare Other | Source: Ambulatory Visit | Attending: Internal Medicine | Admitting: Internal Medicine

## 2018-04-12 ENCOUNTER — Ambulatory Visit: Payer: Medicare Other | Admitting: Anesthesiology

## 2018-04-12 ENCOUNTER — Observation Stay
Admission: RE | Admit: 2018-04-12 | Discharge: 2018-04-12 | Disposition: A | Discharge status: Home / Self Care | Payer: Medicare Other | Source: Ambulatory Visit | Attending: Internal Medicine | Admitting: Internal Medicine

## 2018-04-12 DIAGNOSIS — F419 Anxiety disorder, unspecified: Secondary | ICD-10-CM | POA: Diagnosis present

## 2018-04-12 DIAGNOSIS — Z87891 Personal history of nicotine dependence: Secondary | ICD-10-CM

## 2018-04-12 DIAGNOSIS — Z801 Family history of malignant neoplasm of trachea, bronchus and lung: Secondary | ICD-10-CM

## 2018-04-12 DIAGNOSIS — Z86010 Personal history of colonic polyps: Secondary | ICD-10-CM

## 2018-04-12 DIAGNOSIS — Z9889 Other specified postprocedural states: Secondary | ICD-10-CM

## 2018-04-12 DIAGNOSIS — R911 Solitary pulmonary nodule: Secondary | ICD-10-CM

## 2018-04-12 DIAGNOSIS — I10 Essential (primary) hypertension: Secondary | ICD-10-CM | POA: Diagnosis present

## 2018-04-12 DIAGNOSIS — J939 Pneumothorax, unspecified: Secondary | ICD-10-CM

## 2018-04-12 DIAGNOSIS — Z8249 Family history of ischemic heart disease and other diseases of the circulatory system: Secondary | ICD-10-CM

## 2018-04-12 DIAGNOSIS — Z825 Family history of asthma and other chronic lower respiratory diseases: Secondary | ICD-10-CM

## 2018-04-12 DIAGNOSIS — G4700 Insomnia, unspecified: Secondary | ICD-10-CM | POA: Diagnosis present

## 2018-04-12 DIAGNOSIS — Z7951 Long term (current) use of inhaled steroids: Secondary | ICD-10-CM

## 2018-04-12 DIAGNOSIS — J209 Acute bronchitis, unspecified: Secondary | ICD-10-CM | POA: Diagnosis present

## 2018-04-12 DIAGNOSIS — Z9049 Acquired absence of other specified parts of digestive tract: Secondary | ICD-10-CM

## 2018-04-12 DIAGNOSIS — E7800 Pure hypercholesterolemia, unspecified: Secondary | ICD-10-CM | POA: Diagnosis present

## 2018-04-12 DIAGNOSIS — R0602 Shortness of breath: Secondary | ICD-10-CM

## 2018-04-12 DIAGNOSIS — Z881 Allergy status to other antibiotic agents status: Secondary | ICD-10-CM

## 2018-04-12 DIAGNOSIS — Z833 Family history of diabetes mellitus: Secondary | ICD-10-CM

## 2018-04-12 DIAGNOSIS — Z853 Personal history of malignant neoplasm of breast: Secondary | ICD-10-CM

## 2018-04-12 DIAGNOSIS — Z938 Other artificial opening status: Secondary | ICD-10-CM

## 2018-04-12 DIAGNOSIS — G4733 Obstructive sleep apnea (adult) (pediatric): Secondary | ICD-10-CM | POA: Diagnosis present

## 2018-04-12 DIAGNOSIS — Z170 Estrogen receptor positive status [ER+]: Secondary | ICD-10-CM

## 2018-04-12 DIAGNOSIS — Z923 Personal history of irradiation: Secondary | ICD-10-CM

## 2018-04-12 DIAGNOSIS — J9600 Acute respiratory failure, unspecified whether with hypoxia or hypercapnia: Secondary | ICD-10-CM | POA: Diagnosis not present

## 2018-04-12 DIAGNOSIS — J95811 Postprocedural pneumothorax: Principal | ICD-10-CM | POA: Diagnosis present

## 2018-04-12 DIAGNOSIS — Z888 Allergy status to other drugs, medicaments and biological substances status: Secondary | ICD-10-CM

## 2018-04-12 DIAGNOSIS — Z6841 Body Mass Index (BMI) 40.0 and over, adult: Secondary | ICD-10-CM

## 2018-04-12 DIAGNOSIS — L0232 Furuncle of buttock: Secondary | ICD-10-CM | POA: Diagnosis present

## 2018-04-12 DIAGNOSIS — J440 Chronic obstructive pulmonary disease with acute lower respiratory infection: Secondary | ICD-10-CM | POA: Diagnosis present

## 2018-04-12 DIAGNOSIS — E559 Vitamin D deficiency, unspecified: Secondary | ICD-10-CM | POA: Diagnosis present

## 2018-04-12 DIAGNOSIS — E039 Hypothyroidism, unspecified: Secondary | ICD-10-CM | POA: Diagnosis present

## 2018-04-12 DIAGNOSIS — Z90710 Acquired absence of both cervix and uterus: Secondary | ICD-10-CM

## 2018-04-12 DIAGNOSIS — K219 Gastro-esophageal reflux disease without esophagitis: Secondary | ICD-10-CM | POA: Diagnosis present

## 2018-04-12 DIAGNOSIS — E785 Hyperlipidemia, unspecified: Secondary | ICD-10-CM | POA: Diagnosis present

## 2018-04-12 DIAGNOSIS — Z7982 Long term (current) use of aspirin: Secondary | ICD-10-CM

## 2018-04-12 DIAGNOSIS — D649 Anemia, unspecified: Secondary | ICD-10-CM | POA: Diagnosis present

## 2018-04-12 DIAGNOSIS — Z885 Allergy status to narcotic agent status: Secondary | ICD-10-CM

## 2018-04-12 HISTORY — PX: ELECTROMAGNETIC NAVIGATION BROCHOSCOPY: SHX5369

## 2018-04-12 LAB — CBC
HCT: 38.5 % (ref 35.0–47.0)
HEMOGLOBIN: 13.1 g/dL (ref 12.0–16.0)
MCH: 31.7 pg (ref 26.0–34.0)
MCHC: 34.1 g/dL (ref 32.0–36.0)
MCV: 93.2 fL (ref 80.0–100.0)
PLATELETS: 223 10*3/uL (ref 150–440)
RBC: 4.13 MIL/uL (ref 3.80–5.20)
RDW: 13.3 % (ref 11.5–14.5)
WBC: 7.9 10*3/uL (ref 3.6–11.0)

## 2018-04-12 LAB — CREATININE, SERUM: Creatinine, Ser: 0.79 mg/dL (ref 0.44–1.00)

## 2018-04-12 SURGERY — ELECTROMAGNETIC NAVIGATION BRONCHOSCOPY
Anesthesia: General

## 2018-04-12 MED ORDER — ACETAMINOPHEN 325 MG PO TABS
650.0000 mg | ORAL_TABLET | Freq: Four times a day (QID) | ORAL | Status: DC | PRN
  Administered 2018-04-12: 650 mg via ORAL
  Filled 2018-04-12: qty 2

## 2018-04-12 MED ORDER — ONDANSETRON HCL 4 MG/2ML IJ SOLN
4.0000 mg | Freq: Four times a day (QID) | INTRAMUSCULAR | Status: DC | PRN
  Administered 2018-04-13: 4 mg via INTRAVENOUS
  Filled 2018-04-12: qty 2

## 2018-04-12 MED ORDER — METOPROLOL SUCCINATE ER 25 MG PO TB24
25.0000 mg | ORAL_TABLET | Freq: Every day | ORAL | Status: DC
  Administered 2018-04-13 – 2018-04-16 (×4): 25 mg via ORAL
  Filled 2018-04-12 (×5): qty 1

## 2018-04-12 MED ORDER — LIDOCAINE HCL (CARDIAC) PF 100 MG/5ML IV SOSY
PREFILLED_SYRINGE | INTRAVENOUS | Status: DC | PRN
  Administered 2018-04-12: 60 mg via INTRAVENOUS

## 2018-04-12 MED ORDER — DEXAMETHASONE SODIUM PHOSPHATE 10 MG/ML IJ SOLN
INTRAMUSCULAR | Status: AC
  Filled 2018-04-12: qty 1

## 2018-04-12 MED ORDER — SUGAMMADEX SODIUM 200 MG/2ML IV SOLN
INTRAVENOUS | Status: AC
  Filled 2018-04-12: qty 2

## 2018-04-12 MED ORDER — ACETAMINOPHEN 650 MG RE SUPP
650.0000 mg | Freq: Four times a day (QID) | RECTAL | Status: DC | PRN
  Filled 2018-04-12: qty 1

## 2018-04-12 MED ORDER — ROCURONIUM BROMIDE 100 MG/10ML IV SOLN
INTRAVENOUS | Status: DC | PRN
  Administered 2018-04-12: 20 mg via INTRAVENOUS
  Administered 2018-04-12: 25 mg via INTRAVENOUS
  Administered 2018-04-12: 5 mg via INTRAVENOUS

## 2018-04-12 MED ORDER — MOMETASONE FURO-FORMOTEROL FUM 200-5 MCG/ACT IN AERO
2.0000 | INHALATION_SPRAY | Freq: Two times a day (BID) | RESPIRATORY_TRACT | Status: DC
  Administered 2018-04-12 – 2018-04-16 (×7): 2 via RESPIRATORY_TRACT
  Filled 2018-04-12: qty 8.8

## 2018-04-12 MED ORDER — ENOXAPARIN SODIUM 40 MG/0.4ML ~~LOC~~ SOLN
40.0000 mg | Freq: Two times a day (BID) | SUBCUTANEOUS | Status: DC
  Administered 2018-04-13 – 2018-04-16 (×6): 40 mg via SUBCUTANEOUS
  Filled 2018-04-12 (×10): qty 0.4

## 2018-04-12 MED ORDER — MIDAZOLAM HCL 2 MG/2ML IJ SOLN
INTRAMUSCULAR | Status: AC
  Filled 2018-04-12: qty 8

## 2018-04-12 MED ORDER — ONDANSETRON HCL 4 MG/2ML IJ SOLN
INTRAMUSCULAR | Status: DC | PRN
  Administered 2018-04-12: 4 mg via INTRAVENOUS

## 2018-04-12 MED ORDER — FENTANYL CITRATE (PF) 100 MCG/2ML IJ SOLN
INTRAMUSCULAR | Status: AC
  Filled 2018-04-12: qty 4

## 2018-04-12 MED ORDER — PROPOFOL 10 MG/ML IV BOLUS
INTRAVENOUS | Status: DC | PRN
  Administered 2018-04-12: 130 mg via INTRAVENOUS

## 2018-04-12 MED ORDER — SUCCINYLCHOLINE CHLORIDE 20 MG/ML IJ SOLN
INTRAMUSCULAR | Status: AC
  Filled 2018-04-12: qty 1

## 2018-04-12 MED ORDER — SUGAMMADEX SODIUM 200 MG/2ML IV SOLN
INTRAVENOUS | Status: DC | PRN
  Administered 2018-04-12: 230 mg via INTRAVENOUS

## 2018-04-12 MED ORDER — ALBUTEROL SULFATE (2.5 MG/3ML) 0.083% IN NEBU
2.5000 mg | INHALATION_SOLUTION | Freq: Once | RESPIRATORY_TRACT | Status: AC | PRN
  Administered 2018-04-12: 2.5 mg via RESPIRATORY_TRACT

## 2018-04-12 MED ORDER — MIDAZOLAM HCL 5 MG/5ML IJ SOLN
INTRAMUSCULAR | Status: DC | PRN
  Administered 2018-04-12: 2 mg via INTRAVENOUS

## 2018-04-12 MED ORDER — ONDANSETRON HCL 4 MG/2ML IJ SOLN
INTRAMUSCULAR | Status: AC
  Filled 2018-04-12: qty 2

## 2018-04-12 MED ORDER — ROCURONIUM BROMIDE 50 MG/5ML IV SOLN
INTRAVENOUS | Status: AC
  Filled 2018-04-12: qty 1

## 2018-04-12 MED ORDER — FENTANYL CITRATE (PF) 100 MCG/2ML IJ SOLN
INTRAMUSCULAR | Status: AC
  Filled 2018-04-12: qty 2

## 2018-04-12 MED ORDER — SUCCINYLCHOLINE CHLORIDE 20 MG/ML IJ SOLN
INTRAMUSCULAR | Status: DC | PRN
  Administered 2018-04-12: 120 mg via INTRAVENOUS

## 2018-04-12 MED ORDER — VITAMIN D3 25 MCG (1000 UNIT) PO TABS
1000.0000 [IU] | ORAL_TABLET | Freq: Every day | ORAL | Status: DC
  Administered 2018-04-13 – 2018-04-16 (×4): 1000 [IU] via ORAL
  Filled 2018-04-12 (×9): qty 1

## 2018-04-12 MED ORDER — DEXAMETHASONE SODIUM PHOSPHATE 10 MG/ML IJ SOLN
INTRAMUSCULAR | Status: DC | PRN
  Administered 2018-04-12: 6 mg via INTRAVENOUS

## 2018-04-12 MED ORDER — MIDAZOLAM HCL 2 MG/2ML IJ SOLN
INTRAMUSCULAR | Status: AC
  Filled 2018-04-12: qty 2

## 2018-04-12 MED ORDER — ONDANSETRON HCL 4 MG PO TABS: 4.0000 mg | ORAL_TABLET | Freq: Four times a day (QID) | ORAL | Status: DC | PRN

## 2018-04-12 MED ORDER — LIDOCAINE HCL 2 % EX GEL
1.0000 "application " | Freq: Once | CUTANEOUS | Status: DC
  Filled 2018-04-12: qty 5

## 2018-04-12 MED ORDER — ALBUTEROL SULFATE (2.5 MG/3ML) 0.083% IN NEBU
INHALATION_SOLUTION | RESPIRATORY_TRACT | Status: AC
  Administered 2018-04-12: 2.5 mg via RESPIRATORY_TRACT
  Filled 2018-04-12: qty 3

## 2018-04-12 MED ORDER — FENTANYL CITRATE (PF) 100 MCG/2ML IJ SOLN
INTRAMUSCULAR | Status: DC | PRN
  Administered 2018-04-12 (×2): 50 ug via INTRAVENOUS

## 2018-04-12 MED ORDER — FAMOTIDINE 20 MG PO TABS
20.0000 mg | ORAL_TABLET | Freq: Two times a day (BID) | ORAL | Status: DC
  Administered 2018-04-12 – 2018-04-16 (×8): 20 mg via ORAL
  Filled 2018-04-12 (×9): qty 1

## 2018-04-12 MED ORDER — LACTATED RINGERS IV SOLN
INTRAVENOUS | Status: DC
  Administered 2018-04-12 – 2018-04-14 (×4): via INTRAVENOUS

## 2018-04-12 MED ORDER — FENTANYL CITRATE (PF) 100 MCG/2ML IJ SOLN: 25.0000 ug | INTRAMUSCULAR | Status: DC | PRN

## 2018-04-12 MED ORDER — ALPRAZOLAM 0.5 MG PO TABS
0.5000 mg | ORAL_TABLET | Freq: Two times a day (BID) | ORAL | Status: DC
  Administered 2018-04-12 – 2018-04-16 (×8): 0.5 mg via ORAL
  Filled 2018-04-12 (×9): qty 1

## 2018-04-12 MED ORDER — PROPOFOL 10 MG/ML IV BOLUS
INTRAVENOUS | Status: AC
  Filled 2018-04-12: qty 20

## 2018-04-12 NOTE — H&P (Signed)
[facility] NAME: Michelle Pitts    MR#:  [id number]  DATE OF BIRTH:  Jul 15, 1951  DATE OF ADMISSION:  04/12/2018  PRIMARY CARE PHYSICIAN: Mar Daring, PA-C   REQUESTING/REFERRING PHYSICIAN: Dr. Patricia Pesa  CHIEF COMPLAINT:  No chief complaint on file. chest pain/shortness of breath  HISTORY OF PRESENT ILLNESS:  Michelle Pitts  is a 67 y.o. female with a known history of essential hypertension, hypothyroidism, hyperlipidemia, history of breast cancer, history of COPD, obstructive sleep apnea, spinal stenosis who was scheduled for a outpatient bronchoscopy for a left lung nodule which has been growing.  Patient underwent bronchoscopy but post bronchoscopy he developed some chest pressure and shortness of breath and therefore a chest x-ray was obtained which showed a small left-sided pneumothorax.  Patient is being observed in the hospital overnight.  Patient presently does complain of mild chest pressure and worsening shortness of breath but no other associated symptoms.  She denies any fevers, chills, nausea, vomiting or abdominal pain or any other associated symptoms.  She admits to a cough which is productive with some blood-tinged sputum.  PAST MEDICAL HISTORY:   Past Medical History:  Diagnosis Date  . Anxiety   . Breast cancer ([place]) 01/23/2014   Left breast, T1c, N0, M0. ER/PR +; Her 2 neu not overexpressed. Oncotype DX: 13,Low risk.9% over 10 years.   . Cancer ([place]) 1995   vulvar, [facility]  . COPD (chronic obstructive pulmonary disease) (HCC)    bronchitis  . Dysrhythmia   . GERD (gastroesophageal reflux disease)   . History of colon polyps   . Hypercholesterolemia   . Hypertension   . Hypothyroidism   . Insomnia   . 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 Personal history of radiation therapy   . Personal history of tobacco use, presenting hazards to health 11/18/2015  . Sleep apnea   . Sleep apnea   . SOB (shortness  of breath) on exertion   . Spinal stenosis   . Thyroid disease   . Vitamin D deficiency     PAST SURGICAL HISTORY:   Past Surgical History:  Procedure Laterality Date  . BLADDER SUSPENSION  2001  . BREAST BIOPSY Right 1975, 1985   benign cyst [name]) and fibroadenoma([facility])  . BREAST BIOPSY Right 01/03/2014   Papilloma, no atypia detected on mammography.  Marland Kitchen BREAST BIOPSY Left 01/03/2014   Invasive mammary carcinoma.  Marland Kitchen BREAST BIOPSY Left 01/13/2015   ultrasound giuded biopsy, negative  . BREAST CYST ASPIRATION Left 1985  . BREAST LUMPECTOMY Left 01/23/2014   positive  . BREAST SURGERY Left 01/23/14   wide excision   . BREAST SURGERY  04/10/14   Debridement of fat necrosis  . CHOLECYSTECTOMY    . COLONOSCOPY  2014   Dr Tiffany Kocher  . COLONOSCOPY WITH PROPOFOL N/A 10/10/2016   Procedure: COLONOSCOPY WITH PROPOFOL;  Surgeon: Manya Silvas, MD;  Location: [facility]COPY;  Service: Endoscopy;  Laterality: N/A;  . ROTATOR CUFF REPAIR Right 1995  . VAGINAL HYSTERECTOMY  1978   partial  . VULVECTOMY PARTIAL  1995  . wide excision debriedment Left 04/10/14    SOCIAL HISTORY:   Social History   Tobacco Use  . Smoking status: Former Smoker    Packs/day: 1.00    Years: 50.00    Pack years: 50.00    Types: Cigarettes    Last attempt to quit: 09/15/2014    Years since quitting: 3.5  .  Smokeless tobacco: Never Used  Substance Use Topics  . Alcohol use: Yes    Alcohol/week: 14.0 standard drinks    Types: 14 Cans of beer per week    FAMILY HISTORY:   Family History  Problem Relation Age of Onset  . Diabetes Father   . COPD Mother   . Heart failure Sister   . Emphysema Sister   . Diabetes Sister   . Hypertension Sister   . Emphysema Brother   . Lung cancer Brother   . Crohn's disease Sister   . Hypertension Sister   . Hypertension Sister   . Emphysema Brother   . Cancer Other        great great paternal aunt, ? age  . Cancer Maternal Grandfather 1       ?  source  . Cancer Paternal Grandfather 68       ? source  . Breast cancer Neg Hx     DRUG ALLERGIES:   Allergies  Allergen Reactions  . Codeine Nausea And Vomiting  . Hydrochlorothiazide Swelling    Swelling of tongue.  [place]ack [place] [Levofloxacin In D5w] Hives and Swelling    Leg swelling     REVIEW OF SYSTEMS:   Review of Systems  Constitutional: Negative for fever and weight loss.  HENT: Negative for congestion, nosebleeds and tinnitus.   Eyes: Negative for blurred vision, double vision and redness.  Respiratory: Positive for shortness of breath. Negative for cough and hemoptysis.   Cardiovascular: Positive for chest pain (chest pressure). Negative for orthopnea, leg swelling and PND.  Gastrointestinal: Negative for abdominal pain, diarrhea, melena, nausea and vomiting.  Genitourinary: Negative for dysuria, hematuria and urgency.  Musculoskeletal: Negative for falls and joint pain.  Neurological: Negative for dizziness, tingling, sensory change, focal weakness, seizures, weakness and headaches.  Endo/Heme/Allergies: Negative for polydipsia. Does not bruise/bleed easily.  Psychiatric/Behavioral: Negative for depression and memory loss. The patient is not nervous/anxious.     MEDICATIONS AT HOME:   Prior to Admission medications   Medication Sig Start Date End Date Taking? Authorizing Provider  albuterol (PROAIR HFA) 108 (90 Base) MCG/ACT inhaler Inhale 2 puffs into the lungs every 6 (six) hours as needed for wheezing or shortness of breath. 03/21/17  Yes Mar Daring, PA-C  ALPRAZolam Duanne Moron) 0.5 MG tablet Take 1 tablet (0.5 mg total) by mouth 2 (two) times daily. As needed. 11/30/17  Yes Mar Daring, PA-C  aspirin EC 81 MG tablet Take 81 mg by mouth daily.   Yes [provider]  cholecalciferol (VITAMIN D) 1000 units tablet Take 1 tablet (1,000 Units total) by mouth daily. 02/10/16  Yes Margarita Rana, MD  fluticasone [facility]) 50 MCG/ACT nasal spray  Place 2 sprays into both nostrils 2 (two) times daily as needed for allergies or rhinitis.   Yes [provider]  Fluticasone-Salmeterol (ADVAIR) 250-50 MCG/DOSE AEPB Inhale 1 puff into the lungs 2 (two) times daily. 08/02/17  Yes Mar Daring, PA-C  ketotifen (ZADITOR) 0.025 % ophthalmic solution Place 1 drop into both eyes 2 (two) times daily as needed.   Yes [provider]  metoprolol succinate (TOPROL-XL) 25 MG 24 hr tablet Take 1 tablet (25 mg total) by mouth daily. 10/04/17  Yes Mar Daring, PA-C  naproxen sodium (ALEVE) 220 MG tablet Take 220 mg by mouth daily as needed.   Yes [provider]  ranitidine (ZANTAC) 150 MG tablet Take 150 mg by mouth 2 (two) times daily.   Yes [provider]      VITAL SIGNS:  Blood pressure (!) 152/87, pulse 91, temperature 97.9 F (36.6 C), resp. rate 19, height 5' 4.5" (1.638 m), weight 112 kg, SpO2 100 %.  PHYSICAL EXAMINATION:  Physical Exam  GENERAL:  67 y.o.-year-old patient lying in the bed in NAD.   EYES: Pupils equal, round, reactive to light and accommodation. No scleral icterus. Extraocular muscles intact.  HEENT: Head atraumatic, normocephalic. Oropharynx and nasopharynx clear. No oropharyngeal erythema, moist oral mucosa  NECK:  Supple, no jugular venous distention. No thyroid enlargement, no tenderness.  LUNGS: Good a/e b/l, no wheezing, rales, minimal rhonchi b/l. No use of accessory muscles of respiration.  CARDIOVASCULAR: S1, S2 RRR. No murmurs, rubs, gallops, clicks.  ABDOMEN: Soft, nontender, nondistended. Bowel sounds present. No organomegaly or mass.  EXTREMITIES: No pedal edema, cyanosis, or clubbing. + 2 pedal & radial pulses b/l.   NEUROLOGIC: Cranial nerves II through XII are intact. No focal Motor or sensory deficits appreciated b/l PSYCHIATRIC: The patient is alert and oriented x 3. SKIN: No obvious rash, lesion, or ulcer.   LABORATORY PANEL:   CBC No results for  input(s): WBC, HGB, HCT, PLT in the last 168 hours. ------------------------------------------------------------------------------------------------------------------  Chemistries  No results for input(s): NA, K, CL, CO2, GLUCOSE, BUN, CREATININE, CALCIUM, MG, AST, ALT, ALKPHOS, BILITOT in the last 168 hours.  Invalid input(s): GFRCGP ------------------------------------------------------------------------------------------------------------------  Cardiac Enzymes No results for input(s): TROPONINI in the last 168 hours. ------------------------------------------------------------------------------------------------------------------  RADIOLOGY:  Dg Chest Port 1 View  Result Date: 04/12/2018 CLINICAL DATA:  Status post bronchoscopy. EXAM: PORTABLE CHEST 1 VIEW COMPARISON:  Prior CTs and radiographs FINDINGS: The cardiomediastinal silhouette is unremarkable. A small LEFT apical pneumothorax is noted, approximately 15%. Nodular opacity overlying the mid-lower LEFT lung again noted. There is no evidence of pleural effusion. No other changes identified. IMPRESSION: Small LEFT apical pneumothorax, approximately 15%. Critical Value/emergent results were called by telephone at the time of interpretation on 04/12/2018 at 2:59 pm to [facility], nurse for this patient, who verbally acknowledged these results. Electronically Signed   By: Margarette Canada M.D.   On: 04/12/2018 14:59   Dg C-arm 1-60 Min-no Report  Result Date: 04/12/2018 Fluoroscopy was utilized by the requesting physician.  No radiographic interpretation.   Ct Super D Chest Wo Contrast  Result Date: 04/12/2018 CLINICAL DATA:  Super D chest for biopsy planning. EXAM: CT CHEST WITHOUT CONTRAST TECHNIQUE: Multidetector CT imaging of the chest was performed using thin slice collimation for electromagnetic bronchoscopy planning purposes, without intravenous contrast. COMPARISON:  PET-CT 03/06/2018 and chest CT 12/06/2017. FINDINGS: Cardiovascular: The  heart is normal in size. No pericardial effusion. Stable mild tortuosity and calcification of the thoracic aorta. Stable scattered coronary artery calcifications. Mediastinum/Nodes: No mediastinal or hilar mass or lymphadenopathy. The esophagus is grossly normal. Lungs/Pleura: Stable 18 mm ground-glass nodule in the left lower lobe on image number 29. No other pulmonary lesions are identified. No acute pulmonary findings. Scattered bullous changes. Upper Abdomen: No significant upper abdominal findings. Status post cholecystectomy without biliary dilatation. No hepatic or adrenal gland lesions are identified. Musculoskeletal: No worrisome breast lesions. Evidence of prior left breast surgery and possible radiation. No supraclavicular or axillary adenopathy. No significant bony findings. IMPRESSION: 1. Stable 18 mm ground-glass nodule in the left lower lobe. 2. No enlarged mediastinal or hilar lymph nodes. 3. Evidence of previous left breast surgery and possible radiation but no recurrent breast mass or left axillary adenopathy. Aortic Atherosclerosis (ICD10-I70.0) and Emphysema (ICD10-J43.9). Electronically  Signed   By: Marijo Sanes M.D.   On: 04/12/2018 15:15     IMPRESSION AND PLAN:   67 year old female with past medical history of COPD, obstructive sleep apnea, spinal stenosis, hypothyroidism, hypertension, hyperlipidemia, GERD, history of breast cancer who presented to the hospital for an elective bronchoscopy due to a enlarging left lower lobe nodule and post bronchoscopy patient developed some chest pain and shortness of breath and noted to have a small pneumothorax.  1.  Pneumothorax status post bronchoscopy- patient developed a small pneumothorax post bronchoscopy and biopsy today.  Patient is slightly symptomatic with some chest pressure but not overtly hypoxic. -Continue O2 supplementation, will repeat chest x-ray in the morning, pulmonary has been consulted and will continue to follow.  2.   History of COPD-no acute exacerbation. -Continue Dulera.  3.  Essential hypertension-continue Toprol.  4.  GERD-continue Pepcid.  5.  Anxiety-continue Xanax as needed.    All the records are reviewed and case discussed with ED provider. Management plans discussed with the patient, family and they are in agreement.  CODE STATUS: Full code  TOTAL TIME TAKING CARE OF THIS PATIENT: 40 minutes.    Henreitta Leber M.D on 04/12/2018 at 4:41 PM  Between 7am to 6pm - Pager - [phone]  After 6pm go to [email or web address] - password [email or web address] [place] [facility]  Office  [phone]  CC: Primary care physician; Mar Daring, PA-C

## 2018-04-12 NOTE — Anesthesia Procedure Notes (Signed)
Procedure Name: Intubation Date/Time: 04/12/2018 1:10 PM Performed by: Dionne Bucy, CRNA Pre-anesthesia Checklist: Patient identified, Patient being monitored, Timeout performed, Emergency Drugs available and Suction available Patient Re-evaluated:Patient Re-evaluated prior to induction Oxygen Delivery Method: Circle system utilized Preoxygenation: Pre-oxygenation with 100% oxygen Induction Type: IV induction Ventilation: Mask ventilation without difficulty Laryngoscope Size: 3 and McGraph Grade View: Grade I Tube type: Oral Tube size: 8.5 mm Number of attempts: 2 Airway Equipment and Method: Stylet and Video-laryngoscopy Placement Confirmation: ETT inserted through vocal cords under direct vision,  positive ETCO2 and breath sounds checked- equal and bilateral Secured at: 21 cm Tube secured with: Tape Dental Injury: Teeth and Oropharynx as per pre-operative assessment  Difficulty Due To: Difficulty was anticipated and Difficult Airway- due to anterior larynx

## 2018-04-12 NOTE — Interval H&P Note (Signed)
History and Physical Interval Note:  04/12/2018 2:10 PM  Michelle Pitts  has presented today for surgery, with the diagnosis of LUNG NODULE  The various methods of treatment have been discussed with the patient and family. After consideration of risks, benefits and other options for treatment, the patient has consented to  Procedure(s): ELECTROMAGNETIC NAVIGATION BRONCHOSCOPY (N/A) as a surgical intervention .  The patient's history has been reviewed, patient examined, no change in status, stable for surgery.  I have reviewed the patient's chart and labs.  Questions were answered to the patient's satisfaction.     Flora Lipps

## 2018-04-12 NOTE — Anesthesia Preprocedure Evaluation (Addendum)
Anesthesia Evaluation  Patient identified by MRN, date of birth, ID band Patient awake    Reviewed: Allergy & Precautions, H&P , NPO status , Patient's Chart, lab work & pertinent test results  Airway Mallampati: III  TM Distance: >3 FB Neck ROM: full    Dental no notable dental hx. (+) Teeth Intact   Pulmonary neg pulmonary ROS, sleep apnea , COPD, former smoker,    breath sounds clear to auscultation       Cardiovascular hypertension, negative cardio ROS  + dysrhythmias  Rhythm:Regular Rate:Normal     Neuro/Psych PSYCHIATRIC DISORDERS Anxiety negative neurological ROS  negative psych ROS   GI/Hepatic negative GI ROS, Neg liver ROS, GERD  ,  Endo/Other  negative endocrine ROSHypothyroidism   Renal/GU negative Renal ROS  negative genitourinary   Musculoskeletal   Abdominal   Peds  Hematology negative hematology ROS (+)   Anesthesia Other Findings Past Medical History: No date: Anxiety 01/23/2014: Breast cancer ([place])     Comment:  Left breast, T1c, N0, M0. ER/PR +; Her 2 neu not               overexpressed. Oncotype DX: 13,Low risk.9% over 10 years. 1995: Cancer ([place])     Comment:  vulvar, [facility] No date: COPD (chronic obstructive pulmonary disease) (HCC)     Comment:  bronchitis No date: Dysrhythmia No date: GERD (gastroesophageal reflux disease) No date: History of colon polyps No date: Hypercholesterolemia No date: Hypertension No date: Hypothyroidism No date: Insomnia 01/09/2014: Papilloma of breast No date: Personal history of radiation therapy 11/18/2015: Personal history of tobacco use, presenting hazards to  health No date: Sleep apnea No date: Sleep apnea No date: SOB (shortness of breath) on exertion No date: Spinal stenosis No date: Thyroid disease No date: Vitamin D deficiency  Past Surgical History: 2001: [place], 1985: BREAST BIOPSY; Right     Comment:  benign cyst  [name]) and fibroadenoma([facility]) 01/03/2014: BREAST BIOPSY; Right     Comment:  Papilloma, no atypia detected on mammography. 01/03/2014: BREAST BIOPSY; Left     Comment:  Invasive mammary carcinoma. 01/13/2015: BREAST BIOPSY; Left     Comment:  ultrasound giuded biopsy, negative 1985: BREAST CYST ASPIRATION; Left 01/23/2014: BREAST LUMPECTOMY; Left     Comment:  positive 01/23/14: BREAST SURGERY; Left     Comment:  wide excision  04/10/14: BREAST SURGERY     Comment:  Debridement of fat necrosis No date: CHOLECYSTECTOMY 2014: COLONOSCOPY     Comment:  Dr Tiffany Kocher 10/10/2016: COLONOSCOPY WITH PROPOFOL; N/A     Comment:  Procedure: COLONOSCOPY WITH PROPOFOL;  Surgeon: Manya Silvas, MD;  Location: [facility] ENDOSCOPY;  Service:               Endoscopy;  Laterality: N/A; 1995: ROTATOR CUFF REPAIR; Right 1978: VAGINAL HYSTERECTOMY     Comment:  partial 1995: VULVECTOMY PARTIAL 04/10/14: wide excision debriedment; Left  BMI    Body Mass Index:  41.73 kg/m      Reproductive/Obstetrics negative OB ROS                            Anesthesia Physical Anesthesia Plan  ASA: III  Anesthesia Plan: General   Post-op Pain Management:    Induction:   PONV Risk Score and Plan:   Airway Management Planned:   Additional Equipment:   Intra-op  Plan:   Post-operative Plan:   Informed Consent: I have reviewed the patients History and Physical, chart, labs and discussed the procedure including the risks, benefits and alternatives for the proposed anesthesia with the patient or authorized representative who has indicated his/her understanding and acceptance.   Dental Advisory Given  Plan Discussed with: Anesthesiologist, CRNA and Surgeon  Anesthesia Plan Comments:        Anesthesia Quick Evaluation

## 2018-04-12 NOTE — Anesthesia Postprocedure Evaluation (Signed)
Anesthesia Post Note  Patient: Michelle Pitts  Procedure(s) Performed: ELECTROMAGNETIC NAVIGATION BRONCHOSCOPY (N/A )  Patient location during evaluation: PACU Anesthesia Type: General Level of consciousness: awake and alert Pain management: pain level controlled Vital Signs Assessment: post-procedure vital signs reviewed and stable Respiratory status: spontaneous breathing, nonlabored ventilation, respiratory function stable and patient connected to face mask oxygen Cardiovascular status: blood pressure returned to baseline and stable Postop Assessment: no apparent nausea or vomiting Anesthetic complications: no     Last Vitals:  Vitals:   04/12/18 1130 04/12/18 1419  BP: (!) 149/85 (!) 148/96  Pulse:  88  Resp: 16 12  Temp: 36.6 C 36.6 C  SpO2: 100% 100%    Last Pain:  Vitals:   04/12/18 1130  TempSrc: Tympanic                 Durenda Hurt

## 2018-04-12 NOTE — Op Note (Signed)
Electromagnetic Navigation Bronchoscopy: Indication: lung mass/nodule  Preoperative Diagnosis:lung nodule/mass Post Procedure Diagnosis:lung nodule/mass Consent: Verbal/Written  The Risks and Benefits of the procedure explained to patient/family prior to start of procedure and I have discussed the risk for acute bleeding, increased chance of infection, increased chance of respiratory failure and cardiac arrest and death.  I have also explained to avoid all types of NSAIDs to decrease chance of bleeding, and to avoid food and drinks the midnight prior to procedure.  The procedure consists of a video camera with a light source to be placed and inserted  into the lungs to  look for abnormal tissue and to obtain tissue samples by using needle and biopsy tools.  The patient/family understand the risks and benefits and have agreed to proceed with procedure.   Hand washing performed prior to starting the procedure.   Type of Anesthesia: see Anesthesiology records .   Procedure Performed:  Virtual Bronchoscopy with Multi-planar Image analysis, 3-D reconstruction of coronal, sagittal and multi-planar images for the purposes of planning real-time bronchoscopy using the iLogic Electromagnetic Navigation Bronchoscopy System (superDimension).  Description of Procedure: After obtaining informed consent from the patient, the above sedative and anesthetic measures were carried out, flexible fiberoptic bronchoscope was inserted via Endotracheal tube after patient was intubated by CNA/Anesthesiologist.   The virtual camera was then placed into the central portion of the trachea. The trachea itself was inspected.  The main carina, right and left midstem bronchus and all the segmental and subsegmental airways by virtual bronchoscopy were inspected. The camera was directed to standard registration points at the following centers: main carina, right upper lobe bronchus, right lower lobe bronchus, right middle  lobe bronchus, left upper lobe bronchus, and the left lower lobe bronchus. This data was transferred to the i-Logic ENB system for real-time bronchoscopy.   The scope was then navigated to the LLL  for tissue sampling  Specimans Obtained:  Transbronchial Fine Needle Aspirations 21G times:8  Transbronchial Forceps Biopsy times:7    Transbronchial Single Needle Brush:2  Fluoroscopy:  Fluoroscopy was utilized during the course of this procedure to assure that biopsies were taken in a safe manner under fluoroscopic guidance with spot films required.   Complications:None  Estimated Blood Loss: minimal approx 1cc  Monitoring:  The patient was monitored with continuous oximetry and received supplemental nasal cannula oxygen throughout the procedure. In addition, serial blood pressure measurements and continuous electrocardiography showed these physiologic parameters to remain tolerable throughout the procedure.   Assessment and Plan/Additional Comments: Follow up Pathology Reports    Corrin Parker, M.D.  [facility]  Medical Director [place] Director [facility] Cardio-Pulmonary Department

## 2018-04-12 NOTE — Progress Notes (Signed)
Pt. Wheezing , c/o difficulty taking deep breath . Dr. Ola Spurr notified albuterol neb. Ordered see MAR.

## 2018-04-12 NOTE — Addendum Note (Signed)
Addendum  created 04/12/18 1448 by Durenda Hurt, MD   Order list changed

## 2018-04-12 NOTE — Anesthesia Post-op Follow-up Note (Signed)
Anesthesia QCDR form completed.        

## 2018-04-12 NOTE — Progress Notes (Signed)
Pharmacist - Prescriber Communication  Enoxaparin dose modified from 40 mg subcutaneously once daily to 40 mg subcutaneously twice daily due to BMI > 40. Confirmed with Dr. Verdell Carmine - start dosing tomorrow morning instead of this evening due to recent procedure.  Gailen Venne A. [place], [place].D., BCPS Clinical Pharmacist 04/12/18 15:41

## 2018-04-12 NOTE — OR Nursing (Signed)
To bronch.

## 2018-04-12 NOTE — Discharge Instructions (Signed)
° ° ° ° ° ° ° ° °  Flexible Bronchoscopy, Care After These instructions give you information on caring for yourself after your procedure. Your doctor may also give you more specific instructions. Call your doctor if you have any problems or questions after your procedure. Follow these instructions at home:  Do not eat or drink anything for 2 hours after your procedure. If you try to eat or drink before the medicine wears off, food or drink could go into your lungs. You could also burn yourself.  After 2 hours have passed and when you can cough and gag normally, you may eat soft food and drink liquids slowly.  The day after the test, you may eat your normal diet.  You may do your normal activities.  Keep all doctor visits. Get help right away if:  You get more and more short of breath.  You get light-headed.  You feel like you are going to pass out (faint).  You have chest pain.  You have new problems that worry you.  You cough up more than a little blood.  You cough up more blood than before. This information is not intended to replace advice given to you by your health care provider. Make sure you discuss any questions you have with your health care provider. Document Released: 05/29/2009 Document Revised: 01/07/2016 Document Reviewed: 04/05/2013 Elsevier Interactive Patient Education  2017 [place]   1) The drugs that you were given will stay in your system until tomorrow so for the next 24 hours you should not:  A) Drive an automobile B) Make any legal decisions C) Drink any alcoholic beverage   2) You may resume regular meals tomorrow.  Today it is better to start with liquids and gradually work up to solid foods.  You may eat anything you prefer, but it is better to start with liquids, then soup and crackers, and gradually work up to solid foods.   3) Please notify your doctor immediately if you have any unusual  bleeding, trouble breathing, redness and pain at the surgery site, drainage, fever, or pain not relieved by medication. 4)   5) Your post-operative visit with Dr.                                     is: Date:                        Time:    Please call to schedule your post-operative visit.  6) Additional Instructions:

## 2018-04-12 NOTE — Transfer of Care (Signed)
Immediate Anesthesia Transfer of Care Note  Patient: Michelle Pitts  Procedure(s) Performed: ELECTROMAGNETIC NAVIGATION BRONCHOSCOPY (N/A )  Patient Location: PACU  Anesthesia Type:General  Level of Consciousness: awake and patient cooperative  Airway & Oxygen Therapy: Patient Spontanous Breathing and Patient connected to face mask oxygen  Post-op Assessment: Report given to RN and Post -op Vital signs reviewed and stable  Post vital signs: Reviewed and stable  Last Vitals:  Vitals Value Taken Time  BP 148/96 04/12/2018  2:19 PM  Temp 36.6 C 04/12/2018  2:19 PM  Pulse 86 04/12/2018  2:22 PM  Resp 16 04/12/2018  2:22 PM  SpO2 100 % 04/12/2018  2:22 PM  Vitals shown include unvalidated device data.  Last Pain:  Vitals:   04/12/18 1130  TempSrc: Tympanic         Complications: No apparent anesthesia complications

## 2018-04-13 ENCOUNTER — Observation Stay: Payer: Medicare Other

## 2018-04-13 ENCOUNTER — Inpatient Hospital Stay: Payer: Medicare Other

## 2018-04-13 ENCOUNTER — Encounter: Payer: Self-pay | Admitting: Internal Medicine

## 2018-04-13 DIAGNOSIS — E7800 Pure hypercholesterolemia, unspecified: Secondary | ICD-10-CM | POA: Diagnosis present

## 2018-04-13 DIAGNOSIS — E559 Vitamin D deficiency, unspecified: Secondary | ICD-10-CM | POA: Diagnosis present

## 2018-04-13 DIAGNOSIS — G4700 Insomnia, unspecified: Secondary | ICD-10-CM | POA: Diagnosis present

## 2018-04-13 DIAGNOSIS — Z90710 Acquired absence of both cervix and uterus: Secondary | ICD-10-CM | POA: Diagnosis not present

## 2018-04-13 DIAGNOSIS — J95811 Postprocedural pneumothorax: Secondary | ICD-10-CM | POA: Diagnosis present

## 2018-04-13 DIAGNOSIS — Z825 Family history of asthma and other chronic lower respiratory diseases: Secondary | ICD-10-CM | POA: Diagnosis not present

## 2018-04-13 DIAGNOSIS — Z86010 Personal history of colonic polyps: Secondary | ICD-10-CM | POA: Diagnosis not present

## 2018-04-13 DIAGNOSIS — J440 Chronic obstructive pulmonary disease with acute lower respiratory infection: Secondary | ICD-10-CM | POA: Diagnosis present

## 2018-04-13 DIAGNOSIS — Z170 Estrogen receptor positive status [ER+]: Secondary | ICD-10-CM | POA: Diagnosis not present

## 2018-04-13 DIAGNOSIS — R911 Solitary pulmonary nodule: Secondary | ICD-10-CM | POA: Diagnosis present

## 2018-04-13 DIAGNOSIS — E785 Hyperlipidemia, unspecified: Secondary | ICD-10-CM | POA: Diagnosis present

## 2018-04-13 DIAGNOSIS — E039 Hypothyroidism, unspecified: Secondary | ICD-10-CM | POA: Diagnosis present

## 2018-04-13 DIAGNOSIS — K219 Gastro-esophageal reflux disease without esophagitis: Secondary | ICD-10-CM | POA: Diagnosis present

## 2018-04-13 DIAGNOSIS — Z6841 Body Mass Index (BMI) 40.0 and over, adult: Secondary | ICD-10-CM | POA: Diagnosis not present

## 2018-04-13 DIAGNOSIS — Z87891 Personal history of nicotine dependence: Secondary | ICD-10-CM | POA: Diagnosis not present

## 2018-04-13 DIAGNOSIS — I10 Essential (primary) hypertension: Secondary | ICD-10-CM | POA: Diagnosis present

## 2018-04-13 DIAGNOSIS — J9600 Acute respiratory failure, unspecified whether with hypoxia or hypercapnia: Secondary | ICD-10-CM | POA: Diagnosis not present

## 2018-04-13 DIAGNOSIS — R0602 Shortness of breath: Secondary | ICD-10-CM | POA: Diagnosis not present

## 2018-04-13 DIAGNOSIS — Z833 Family history of diabetes mellitus: Secondary | ICD-10-CM | POA: Diagnosis not present

## 2018-04-13 DIAGNOSIS — G4733 Obstructive sleep apnea (adult) (pediatric): Secondary | ICD-10-CM | POA: Diagnosis present

## 2018-04-13 DIAGNOSIS — Z923 Personal history of irradiation: Secondary | ICD-10-CM | POA: Diagnosis not present

## 2018-04-13 DIAGNOSIS — J209 Acute bronchitis, unspecified: Secondary | ICD-10-CM | POA: Diagnosis present

## 2018-04-13 DIAGNOSIS — Z8249 Family history of ischemic heart disease and other diseases of the circulatory system: Secondary | ICD-10-CM | POA: Diagnosis not present

## 2018-04-13 DIAGNOSIS — J939 Pneumothorax, unspecified: Secondary | ICD-10-CM | POA: Diagnosis present

## 2018-04-13 DIAGNOSIS — Z853 Personal history of malignant neoplasm of breast: Secondary | ICD-10-CM | POA: Diagnosis not present

## 2018-04-13 DIAGNOSIS — Z9049 Acquired absence of other specified parts of digestive tract: Secondary | ICD-10-CM | POA: Diagnosis not present

## 2018-04-13 LAB — PROTIME-INR
INR: 0.92
PROTHROMBIN TIME: 12.3 s (ref 11.4–15.2)

## 2018-04-13 LAB — PLATELET COUNT: PLATELETS: 212 10*3/uL (ref 150–440)

## 2018-04-13 LAB — APTT: aPTT: 25 seconds (ref 24–36)

## 2018-04-13 MED ORDER — OXYCODONE-ACETAMINOPHEN 5-325 MG PO TABS
1.0000 | ORAL_TABLET | ORAL | Status: DC | PRN
  Administered 2018-04-13 (×2): 1 via ORAL
  Filled 2018-04-13 (×2): qty 1

## 2018-04-13 MED ORDER — MIDAZOLAM HCL 2 MG/2ML IJ SOLN
INTRAMUSCULAR | Status: AC | PRN
  Administered 2018-04-13 (×3): 1 mg via INTRAVENOUS

## 2018-04-13 MED ORDER — MIDAZOLAM HCL 2 MG/2ML IJ SOLN
INTRAMUSCULAR | Status: AC
  Filled 2018-04-13: qty 4

## 2018-04-13 MED ORDER — ASPIRIN EC 81 MG PO TBEC
81.0000 mg | DELAYED_RELEASE_TABLET | Freq: Every day | ORAL | Status: DC
  Administered 2018-04-13 – 2018-04-16 (×4): 81 mg via ORAL
  Filled 2018-04-13 (×4): qty 1

## 2018-04-13 MED ORDER — FENTANYL CITRATE (PF) 100 MCG/2ML IJ SOLN
INTRAMUSCULAR | Status: AC
  Filled 2018-04-13: qty 4

## 2018-04-13 MED ORDER — SODIUM CHLORIDE 0.9% FLUSH
5.0000 mL | Freq: Three times a day (TID) | INTRAVENOUS | Status: DC
  Administered 2018-04-13 – 2018-04-16 (×6): 5 mL

## 2018-04-13 MED ORDER — ALBUTEROL SULFATE (2.5 MG/3ML) 0.083% IN NEBU: 2.5000 mg | INHALATION_SOLUTION | Freq: Four times a day (QID) | RESPIRATORY_TRACT | Status: DC | PRN

## 2018-04-13 MED ORDER — FENTANYL CITRATE (PF) 100 MCG/2ML IJ SOLN
INTRAMUSCULAR | Status: AC | PRN
  Administered 2018-04-13 (×3): 50 ug via INTRAVENOUS

## 2018-04-13 NOTE — Sedation Documentation (Signed)
Called to give report, nurse Janett Billow) requested bedside report

## 2018-04-13 NOTE — Procedures (Signed)
Pre procedural Dx: Enlarging symptomatic left sided PTX Post procedural Dx: Same  Technically successful CT guided placed of a 10 Fr drainage catheter placement into the apical aspect of the left pleural space resulting in near complete evacuation of the large left sided PTX.   EBL: None  Complications: None immediate  Ronny Bacon, MD Pager #: [phone]

## 2018-04-13 NOTE — Progress Notes (Signed)
Pharmacy consult noted for "Post IR Procedure Consult - Anticoagulant/Antiplatelet PTA/Inpatient Med List Review by Pharmacist."   Enoxaparin was held by RN this morning for chest tube placement but the order was not discontinued. Continue as ordered.   Patient takes aspirin EC 81 mg po daily PTA which has been on hold. Dr. Posey Pronto made aware and will order if appropriate.   Maeryn Mcgath A. [place], [place].D., BCPS Clinical Pharmacist 04/13/18 15:10

## 2018-04-13 NOTE — Consult Note (Signed)
Reason for Consult:  Assistance with management of iatrogenic pneumothorax. Referring Physician:  Dr. Serita Grit  Michelle Pitts is an 67 y.o. female.   HPI: patient is a 67 year old morbidly obese female with a history of chronic obstructive pulmonary disease who has had a left lower lobe ground glass opacity that has been growing over the span of several years. She underwent navigational bronchoscopy on 8/29 under general anesthesia. Post procedural chest x-ray showed pneumothorax. Patient was admitted for observation. Chest x-ray this morning reveals that the pneumothorax is enlarging. The patient has noted increased dyspnea on ambulation from bed to the bathroom. She also has felt somewhat anxious. Patient has also has some pleuritic chest pain particularly on deep inspiration. Otherwise she does not describe any distress or symptomatology. The above symptoms have been persistent since yesterday after the procedure. She has been on supplemental oxygen but the symptoms persist.  I have reviewed all imaging related to this issue independently. The patient definitely has some enlarging pneumothorax on the left compared to her postoperative films.  Past Medical History:  Diagnosis Date  . Anxiety   . Breast cancer ([place]) 01/23/2014   Left breast, T1c, N0, M0. ER/PR +; Her 2 neu not overexpressed. Oncotype DX: 13,Low risk.9% over 10 years.   . Cancer ([place]) 1995   vulvar, [facility]  . COPD (chronic obstructive pulmonary disease) ([facility])    bronchitis  . Dysrhythmia   . GERD (gastroesophageal reflux disease)   . History of colon polyps   . Hypercholesterolemia   . Hypertension   . Hypothyroidism   . Insomnia   . 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 Personal history of radiation therapy   . Personal history of tobacco use, presenting hazards to health 11/18/2015  . Sleep apnea   . Sleep apnea   . SOB (shortness of breath) on exertion   . Spinal stenosis   . Thyroid disease   . Vitamin D deficiency      Past Surgical History:  Procedure Laterality Date  . BLADDER SUSPENSION  2001  . BREAST BIOPSY Right 1975, 1985   benign cyst [name]) and fibroadenoma([facility])  . BREAST BIOPSY Right 01/03/2014   Papilloma, no atypia detected on mammography.  Marland Kitchen BREAST BIOPSY Left 01/03/2014   Invasive mammary carcinoma.  Marland Kitchen BREAST BIOPSY Left 01/13/2015   ultrasound giuded biopsy, negative  . BREAST CYST ASPIRATION Left 1985  . BREAST LUMPECTOMY Left 01/23/2014   positive  . BREAST SURGERY Left 01/23/14   wide excision   . BREAST SURGERY  04/10/14   Debridement of fat necrosis  . CHOLECYSTECTOMY    . COLONOSCOPY  2014   Dr Tiffany Kocher  . COLONOSCOPY WITH PROPOFOL N/A 10/10/2016   Procedure: COLONOSCOPY WITH PROPOFOL;  Surgeon: Manya Silvas, MD;  Location: [facility] ENDOSCOPY;  Service: Endoscopy;  Laterality: N/A;  . ELECTROMAGNETIC NAVIGATION BROCHOSCOPY N/A 04/12/2018   Procedure: ELECTROMAGNETIC NAVIGATION BRONCHOSCOPY;  Surgeon: Flora Lipps, MD;  Location: [facility] ORS;  Service: Cardiopulmonary;  Laterality: N/A;  . ROTATOR CUFF REPAIR Right 1995  . VAGINAL HYSTERECTOMY  1978   partial  . VULVECTOMY PARTIAL  1995  . wide excision debriedment Left 04/10/14    Family History  Problem Relation Age of Onset  . Diabetes Father   . COPD Mother   . Heart failure Sister   . Emphysema Sister   . Diabetes Sister   . Hypertension Sister   . Emphysema Brother   . Lung cancer Brother   . Crohn's  disease Sister   . Hypertension Sister   . Hypertension Sister   . Emphysema Brother   . Cancer Other        great great paternal aunt, ? age  . Cancer Maternal Grandfather 29       ? source  . Cancer Paternal Grandfather 55       ? source  . Breast cancer Neg Hx     Social History:  reports that she quit smoking about 3 years ago. Her smoking use included cigarettes. She has a 50.00 pack-year smoking history. She has never used smokeless tobacco. She reports that she drinks about 14.0 standard  drinks of alcohol per week. She reports that she does not use drugs.  Allergies:  Allergies  Allergen Reactions  . Codeine Nausea And Vomiting  . Hydrochlorothiazide Swelling    Swelling of tongue.  [place] [Levofloxacin In D5w] Hives and Swelling    Leg swelling     Medications: I have reviewed the patient's current medications.  Results for orders placed or performed during the hospital encounter of 04/12/18 (from the past 48 hour(s))  CBC     Status: None   Collection Time: 04/12/18  5:12 PM  Result Value Ref Range   WBC 7.9 3.6 - 11.0 K/uL   RBC 4.13 3.80 - 5.20 MIL/uL   Hemoglobin 13.1 12.0 - 16.0 g/dL   HCT 38.5 35.0 - 47.0 %   MCV 93.2 80.0 - 100.0 fL   MCH 31.7 26.0 - 34.0 pg   MCHC 34.1 32.0 - 36.0 g/dL   RDW 13.3 11.5 - 14.5 %   Platelets 223 150 - 440 K/uL    Comment: Performed at [facility], [address] [place], [place] [address]  Creatinine, serum     Status: None   Collection Time: 04/12/18  5:12 PM  Result Value Ref Range   Creatinine, Ser 0.79 0.44 - 1.00 mg/dL   GFR calc non Af Amer >60 >60 mL/min   GFR calc Af Amer >60 >60 mL/min    Comment: (NOTE) The eGFR has been calculated using the CKD EPI equation. This calculation has not been validated in all clinical situations. eGFR's persistently <60 mL/min signify possible Chronic Kidney Disease. Performed at [facility], [address] [place], [place] [address]   Protime-INR     Status: None   Collection Time: 04/13/18 10:51 AM  Result Value Ref Range   Prothrombin Time 12.3 11.4 - 15.2 seconds   INR 0.92     Comment: Performed at [facility], [address] [place], [place] [address]  Platelet count     Status: None   Collection Time: 04/13/18 10:51 AM  Result Value Ref Range   Platelets 212 150 - 440 K/uL    Comment: Performed at [facility], [address] [place], [place] [address]  APTT     Status: None   Collection Time: 04/13/18 10:51  AM  Result Value Ref Range   aPTT 25 24 - 36 seconds    Comment: Performed at [facility], [address] [place], [place] [address]    Portable Chest 1 View  Result Date: 04/13/2018 CLINICAL DATA:  67 y/o  F; pneumothorax. EXAM: PORTABLE CHEST 1 VIEW COMPARISON:  04/12/2018 chest radiograph. FINDINGS: Stable cardiac silhouette. No rightward mediastinal shift. Mild interval progression of the left-sided pneumothorax. Increasing atelectasis in the left lung. Nodular opacities projecting over the left lower lung zone again noted. No  acute osseous abnormality identified. IMPRESSION: Mild interval progression of left-sided pneumothorax. No mediastinal shift. These results will be called to the ordering clinician or representative by the Radiologist Assistant, and communication documented in the PACS or zVision Dashboard. Electronically Signed   By: Kristine Garbe M.D.   On: 04/13/2018 06:28   Dg Chest Port 1 View  Result Date: 04/12/2018 CLINICAL DATA:  Status post bronchoscopy. EXAM: PORTABLE CHEST 1 VIEW COMPARISON:  Prior CTs and radiographs FINDINGS: The cardiomediastinal silhouette is unremarkable. A small LEFT apical pneumothorax is noted, approximately 15%. Nodular opacity overlying the mid-lower LEFT lung again noted. There is no evidence of pleural effusion. No other changes identified. IMPRESSION: Small LEFT apical pneumothorax, approximately 15%. Critical Value/emergent results were called by telephone at the time of interpretation on 04/12/2018 at 2:59 pm to [facility], nurse for this patient, who verbally acknowledged these results. Electronically Signed   By: Margarette Canada M.D.   On: 04/12/2018 14:59   Dg C-arm 1-60 Min-no Report  Result Date: 04/12/2018 Fluoroscopy was utilized by the requesting physician.  No radiographic interpretation.   Ct Super D Chest Wo Contrast  Result Date: 04/12/2018 CLINICAL DATA:  Super D chest for biopsy planning. EXAM: CT CHEST WITHOUT  CONTRAST TECHNIQUE: Multidetector CT imaging of the chest was performed using thin slice collimation for electromagnetic bronchoscopy planning purposes, without intravenous contrast. COMPARISON:  PET-CT 03/06/2018 and chest CT 12/06/2017. FINDINGS: Cardiovascular: The heart is normal in size. No pericardial effusion. Stable mild tortuosity and calcification of the thoracic aorta. Stable scattered coronary artery calcifications. Mediastinum/Nodes: No mediastinal or hilar mass or lymphadenopathy. The esophagus is grossly normal. Lungs/Pleura: Stable 18 mm ground-glass nodule in the left lower lobe on image number 29. No other pulmonary lesions are identified. No acute pulmonary findings. Scattered bullous changes. Upper Abdomen: No significant upper abdominal findings. Status post cholecystectomy without biliary dilatation. No hepatic or adrenal gland lesions are identified. Musculoskeletal: No worrisome breast lesions. Evidence of prior left breast surgery and possible radiation. No supraclavicular or axillary adenopathy. No significant bony findings. IMPRESSION: 1. Stable 18 mm ground-glass nodule in the left lower lobe. 2. No enlarged mediastinal or hilar lymph nodes. 3. Evidence of previous left breast surgery and possible radiation but no recurrent breast mass or left axillary adenopathy. Aortic Atherosclerosis (ICD10-I70.0) and Emphysema (ICD10-J43.9). Electronically Signed   By: Marijo Sanes M.D.   On: 04/12/2018 15:15    Review of Systems  Constitutional: Negative.   HENT: Negative.   Eyes: Negative.   Respiratory: Positive for shortness of breath.   Cardiovascular: Positive for chest pain.  Gastrointestinal: Negative.   Genitourinary: Negative.   Musculoskeletal: Negative.   Skin: Negative.   Neurological: Positive for dizziness.  Endo/Heme/Allergies: Negative.   Psychiatric/Behavioral: Negative.    Blood pressure (!) 155/90, pulse 80, temperature 97.7 F (36.5 C), temperature source Oral,  resp. rate 18, height 5' 4.5" (1.638 m), weight 112 kg, SpO2 100 %.   Physical Exam  Constitutional: She is oriented to person, place, and time.  Morbidly obese woman and no acute respiratory distress. No diaphoresis.  HENT:  Head: Normocephalic and atraumatic.  Mouth/Throat: Oropharynx is clear and moist.  Eyes: Pupils are equal, round, and reactive to light.  Neck: Neck supple. No JVD present. No tracheal deviation present.  Cardiovascular: Normal rate, regular rhythm, normal heart sounds and intact distal pulses.  Respiratory: No stridor. No respiratory distress. She has no wheezes. She has no rales.  She is mildly tachypneic, otherwise no distress. She  has diminished breath sounds on the left compared to the right. Good air entry on the right.  GI: Soft. Bowel sounds are normal. She exhibits no distension. There is no tenderness.  Musculoskeletal: Normal range of motion. She exhibits no edema or deformity.  Lymphadenopathy:    She has no cervical adenopathy.  Neurological: She is alert and oriented to person, place, and time. No cranial nerve deficit.  Skin: Skin is warm and dry.  Psychiatric: She has a normal mood and affect. Judgment and thought content normal.      Assessment/Plan:  1) Iatrogenic left pneumothorax which is expanding pneumothorax is now 40 to 50% in size. Patient will need chest tube placement. I have discussed with Interventional Radiology who will place a chest tube under CT guidance which will be the safest for the patient given her body habitus. I discussed all of the above with Dr. Posey Pronto as well. I suspect that chest tube may only need to stay 24 to 48 hours and her pneumothorax should resolve without difficulty. She does not appear to have associated pleural effusion.  2) Chronic Obstructive Pulmonary Disease without acute exacerbation, continue her home meds.  3) Groundglass opacity/nodule, increasing in size highly suspicious for carcinoma. 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 Patient has undergone biopsy and results are pending. This issue will need to be followed as an outpatient and plan of care has been already set for this patient.  4) Morbid Obesity, BMI 41.7, this issue adds complexity to her management. Patient will benefit from weight loss.  5) Hypertension, management per hospitalist team.   As noted, I have discussed the above with Dr. Posey Pronto. Thank you for allowing [facility] to participate in this patient's care. Do note that Dr.M.Saaman, will be following the patient over the weekend with regards to questions pertaining the chest tube. I suspect that pneumothorax will resolve without issue once it is evacuated.   Vernard Gambles 04/13/2018, 1:44 PM

## 2018-04-13 NOTE — Care Management (Signed)
RNCM met with patient to explain [place] letter. Patient was planned bronchoscopy, independent from home alone. She is currently requiring supplemental O2 via Nasal cannula and not in any distress. Patient was able to talk without shortness of breath. She shared that her lung was nicked during the planned decision and shared frustration with CM. URRN updated. Unclear if chest tube will be needed. RNCM/URRN following. MOON has not been delivered at time of this note entry.

## 2018-04-13 NOTE — Consult Note (Signed)
Chief Complaint: Enlarging symptomatic left sided pneumothorax.  Referring Physician(s): Kasa (Pulmonary)  Patient Status: [facility] - In-pt  History of Present Illness: Michelle Pitts is a 67 y.o. female with past medical history significant for breast cancer, cancer of the vulva, COPD, hypertension and hyperlipidemia.  The patient was found to have a indeterminate left lower lobe pulmonary nodule for which she underwent underwent bronchoscopic biopsy yesterday however that procedure was complicated by development of a post procedural pneumothorax.    Unfortunately, today's chest radiograph demonstrates interval enlargement of the pneumothorax with associated worsening of the patient's shortness of breath.  As such, request made for CT-guided left sided chest tube placement for reexpansion of the left lung.  Patient admits to intermittent chest pain and shortness of breath.  She denies fever or chills.  She is otherwise without complaint.  Patient has had nothing to eat or drink this morning.   Past Medical History:  Diagnosis Date  . Anxiety   . Breast cancer ([place]) 01/23/2014   Left breast, T1c, N0, M0. ER/PR +; Her 2 neu not overexpressed. Oncotype DX: 13,Low risk.9% over 10 years.   . Cancer ([place]) 1995   vulvar, [facility]  . COPD (chronic obstructive pulmonary disease) ([facility])    bronchitis  . Dysrhythmia   . GERD (gastroesophageal reflux disease)   . History of colon polyps   . Hypercholesterolemia   . Hypertension   . Hypothyroidism   . Insomnia   . 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 Personal history of radiation therapy   . Personal history of tobacco use, presenting hazards to health 11/18/2015  . Sleep apnea   . Sleep apnea   . SOB (shortness of breath) on exertion   . Spinal stenosis   . Thyroid disease   . Vitamin D deficiency     Past Surgical History:  Procedure Laterality Date  . BLADDER SUSPENSION  2001  . BREAST BIOPSY Right 1975, 1985   benign cyst  [name]) and fibroadenoma([facility])  . BREAST BIOPSY Right 01/03/2014   Papilloma, no atypia detected on mammography.  Marland Kitchen BREAST BIOPSY Left 01/03/2014   Invasive mammary carcinoma.  Marland Kitchen BREAST BIOPSY Left 01/13/2015   ultrasound giuded biopsy, negative  . BREAST CYST ASPIRATION Left 1985  . BREAST LUMPECTOMY Left 01/23/2014   positive  . BREAST SURGERY Left 01/23/14   wide excision   . BREAST SURGERY  04/10/14   Debridement of fat necrosis  . CHOLECYSTECTOMY    . COLONOSCOPY  2014   Dr Tiffany Kocher  . COLONOSCOPY WITH PROPOFOL N/A 10/10/2016   Procedure: COLONOSCOPY WITH PROPOFOL;  Surgeon: Manya Silvas, MD;  Location: [facility] ENDOSCOPY;  Service: Endoscopy;  Laterality: N/A;  . ELECTROMAGNETIC NAVIGATION BROCHOSCOPY N/A 04/12/2018   Procedure: ELECTROMAGNETIC NAVIGATION BRONCHOSCOPY;  Surgeon: Flora Lipps, MD;  Location: [facility] ORS;  Service: Cardiopulmonary;  Laterality: N/A;  . ROTATOR CUFF REPAIR Right 1995  . VAGINAL HYSTERECTOMY  1978   partial  . VULVECTOMY PARTIAL  1995  . wide excision debriedment Left 04/10/14    Allergies: Codeine; Hydrochlorothiazide; and Levaquin [levofloxacin in d5w]  Medications: Prior to Admission medications   Medication Sig Start Date End Date Taking? Authorizing Provider  albuterol (PROAIR HFA) 108 (90 Base) MCG/ACT inhaler Inhale 2 puffs into the lungs every 6 (six) hours as needed for wheezing or shortness of breath. 03/21/17  Yes Mar Daring, PA-C  ALPRAZolam Duanne Moron) 0.5 MG tablet Take 1 tablet (0.5 mg total) by mouth 2 (two) times  daily. As needed. 11/30/17  Yes Mar Daring, PA-C  aspirin EC 81 MG tablet Take 81 mg by mouth daily.   Yes [provider]  cholecalciferol (VITAMIN D) 1000 units tablet Take 1 tablet (1,000 Units total) by mouth daily. 02/10/16  Yes Margarita Rana, MD  fluticasone [facility]) 50 MCG/ACT nasal spray Place 2 sprays into both nostrils 2 (two) times daily as needed for allergies or rhinitis.   Yes  [provider]  Fluticasone-Salmeterol (ADVAIR) 250-50 MCG/DOSE AEPB Inhale 1 puff into the lungs 2 (two) times daily. 08/02/17  Yes Mar Daring, PA-C  ketotifen (ZADITOR) 0.025 % ophthalmic solution Place 1 drop into both eyes 2 (two) times daily as needed.   Yes [provider]  metoprolol succinate (TOPROL-XL) 25 MG 24 hr tablet Take 1 tablet (25 mg total) by mouth daily. 10/04/17  Yes Mar Daring, PA-C  naproxen sodium (ALEVE) 220 MG tablet Take 220 mg by mouth daily as needed.   Yes [provider]  ranitidine (ZANTAC) 150 MG tablet Take 150 mg by mouth 2 (two) times daily.   Yes [provider]     Family History  Problem Relation Age of Onset  . Diabetes Father   . COPD Mother   . Heart failure Sister   . Emphysema Sister   . Diabetes Sister   . Hypertension Sister   . Emphysema Brother   . Lung cancer Brother   . Crohn's disease Sister   . Hypertension Sister   . Hypertension Sister   . Emphysema Brother   . Cancer Other        great great paternal aunt, ? age  . Cancer Maternal Grandfather 3       ? source  . Cancer Paternal Grandfather 17       ? source  . Breast cancer Neg Hx     Social History   Socioeconomic History  . Marital status: Divorced    Spouse name: Not on file  . Number of children: 2  . Years of education: Not on file  . Highest education level: Some college, no degree  Occupational History  . Occupation: retired  Scientific laboratory technician  . Financial resource strain: Not hard at all  . Food insecurity:    Worry: Never true    Inability: Never true  . Transportation needs:    Medical: No    Non-medical: No  Tobacco Use  . Smoking status: Former Smoker    Packs/day: 1.00    Years: 50.00    Pack years: 50.00    Types: Cigarettes    Last attempt to quit: 09/15/2014    Years since quitting: 3.5  . Smokeless tobacco: Never Used  Substance and Sexual Activity  . Alcohol use: Yes    Alcohol/week:  14.0 standard drinks    Types: 14 Cans of beer per week  . Drug use: No  . Sexual activity: Not Currently  Lifestyle  . Physical activity:    Days per week: Not on file    Minutes per session: Not on file  . Stress: Not at all  Relationships  . Social connections:    Talks on phone: Not on file    Gets together: Not on file    Attends religious service: Not on file    Active member of club or organization: Not on file    Attends meetings of clubs or organizations: Not on file    Relationship status: Not on file  Other Topics Concern  . Not on file  Social History Narrative  . Not on file    ECOG Status: 2 - Symptomatic, <50% confined to bed  Review of Systems: A 12 point ROS discussed and pertinent positives are indicated in the HPI above.  All other systems are negative.  Review of Systems  Constitutional: Positive for activity change. Negative for appetite change and fever.  Respiratory: Positive for shortness of breath. Negative for cough.   Cardiovascular: Positive for chest pain.  Psychiatric/Behavioral: Negative.     Vital Signs: BP (!) 156/78   Pulse 85   Temp 97.7 F (36.5 C) (Oral)   Resp 12   Ht 5' 4.5" (1.638 m)   Wt 112 kg   SpO2 98%   BMI 41.73 kg/m   Physical Exam  Constitutional: She appears well-developed and well-nourished.  Cardiovascular: Regular rhythm.  Pulmonary/Chest:  Decreased breath sounds within the left lung  Psychiatric: She has a normal mood and affect. Her behavior is normal.  Vitals reviewed.   Imaging: Portable Chest 1 View  Result Date: 04/13/2018 CLINICAL DATA:  67 y/o  F; pneumothorax. EXAM: PORTABLE CHEST 1 VIEW COMPARISON:  04/12/2018 chest radiograph. FINDINGS: Stable cardiac silhouette. No rightward mediastinal shift. Mild interval progression of the left-sided pneumothorax. Increasing atelectasis in the left lung. Nodular opacities projecting over the left lower lung zone again noted. No acute osseous abnormality  identified. IMPRESSION: Mild interval progression of left-sided pneumothorax. No mediastinal shift. These results will be called to the ordering clinician or representative by the Radiologist Assistant, and communication documented in the PACS or zVision Dashboard. Electronically Signed   By: Kristine Garbe M.D.   On: 04/13/2018 06:28   Dg Chest Port 1 View  Result Date: 04/12/2018 CLINICAL DATA:  Status post bronchoscopy. EXAM: PORTABLE CHEST 1 VIEW COMPARISON:  Prior CTs and radiographs FINDINGS: The cardiomediastinal silhouette is unremarkable. A small LEFT apical pneumothorax is noted, approximately 15%. Nodular opacity overlying the mid-lower LEFT lung again noted. There is no evidence of pleural effusion. No other changes identified. IMPRESSION: Small LEFT apical pneumothorax, approximately 15%. Critical Value/emergent results were called by telephone at the time of interpretation on 04/12/2018 at 2:59 pm to [facility], nurse for this patient, who verbally acknowledged these results. Electronically Signed   By: Margarette Canada M.D.   On: 04/12/2018 14:59   Dg C-arm 1-60 Min-no Report  Result Date: 04/12/2018 Fluoroscopy was utilized by the requesting physician.  No radiographic interpretation.   Ct Super D Chest Wo Contrast  Result Date: 04/12/2018 CLINICAL DATA:  Super D chest for biopsy planning. EXAM: CT CHEST WITHOUT CONTRAST TECHNIQUE: Multidetector CT imaging of the chest was performed using thin slice collimation for electromagnetic bronchoscopy planning purposes, without intravenous contrast. COMPARISON:  PET-CT 03/06/2018 and chest CT 12/06/2017. FINDINGS: Cardiovascular: The heart is normal in size. No pericardial effusion. Stable mild tortuosity and calcification of the thoracic aorta. Stable scattered coronary artery calcifications. Mediastinum/Nodes: No mediastinal or hilar mass or lymphadenopathy. The esophagus is grossly normal. Lungs/Pleura: Stable 18 mm ground-glass nodule in the  left lower lobe on image number 29. No other pulmonary lesions are identified. No acute pulmonary findings. Scattered bullous changes. Upper Abdomen: No significant upper abdominal findings. Status post cholecystectomy without biliary dilatation. No hepatic or adrenal gland lesions are identified. Musculoskeletal: No worrisome breast lesions. Evidence of prior left breast surgery and possible radiation. No supraclavicular or axillary adenopathy. No significant bony findings. IMPRESSION: 1. Stable 18 mm ground-glass nodule in the  left lower lobe. 2. No enlarged mediastinal or hilar lymph nodes. 3. Evidence of previous left breast surgery and possible radiation but no recurrent breast mass or left axillary adenopathy. Aortic Atherosclerosis (ICD10-I70.0) and Emphysema (ICD10-J43.9). Electronically Signed   By: Marijo Sanes M.D.   On: 04/12/2018 15:15    Labs:  CBC: Recent Labs    02/14/18 1512 04/05/18 0951 04/12/18 1712 04/13/18 1051  WBC 6.8 5.6 7.9  --   HGB 12.8 13.5 13.1  --   HCT 39.4 39.2 38.5  --   PLT 228 220 223 212    COAGS: Recent Labs    04/13/18 1051  INR 0.92  APTT 25    BMP: Recent Labs    02/14/18 1512 04/05/18 0951 04/12/18 1712  NA 141 142  --   K 4.5 4.6  --   CL 103 107  --   CO2 23 29  --   GLUCOSE 102* 104*  --   BUN 19 15  --   CALCIUM 10.2 9.6  --   CREATININE 0.80 0.68 0.79  GFRNONAA 76 >60 >60  GFRAA 88 >60 >60    LIVER FUNCTION TESTS: Recent Labs    02/14/18 1512  BILITOT 0.2  AST 28  ALT 41*  ALKPHOS 134*  PROT 6.9  ALBUMIN 4.4    TUMOR MARKERS: No results for input(s): AFPTM, CEA, CA199, CHROMGRNA in the last 8760 hours.  Assessment and Plan:  RENIKA SHIFLET is a 67 y.o. female with past medical history significant for breast cancer, cancer of the vulva, COPD, hypertension and hyperlipidemia who has suffered a slowly expanding and now symptomatic left-sided pneumothorax following attempted bronchoscopic biopsy of  indeterminate left lower lobe/nodule.  Risks and benefits CT-guided left of the chest tube placement was discussed with the patient including bleeding, infection, air embolism, damage to adjacent structures,.  All of the patient's questions were answered, patient is agreeable to proceed.  Consent signed and in chart.  Thank you for this interesting consult.  I greatly enjoyed meeting Naw L Fetty and look forward to participating in their care.  A copy of this report was sent to the requesting provider on this date.  Electronically Signed: Sandi Mariscal, MD 04/13/2018, 12:54 PM   I spent a total of 20 Minutes in face to face in clinical consultation, greater than 50% of which was counseling/coordinating care for CT guided chest tube placement.

## 2018-04-13 NOTE — Progress Notes (Signed)
[facility]                                                                                                                                                                                  Patient Demographics   Michelle Pitts, is a 67 y.o. female, DOB - 1950-12-27, JQG:[id number]  Admit date - 04/12/2018   Admitting Physician Flora Lipps, MD  Outpatient Primary MD for the patient is Mar Daring, PA-C   LOS - 0  Subjective: Patient admitted after lung biopsy noted to have pneumothorax now has expansion of pneumothorax, patient had chest tube placed   Review of Systems:   CONSTITUTIONAL: No documented fever. No fatigue, weakness. No weight gain, no weight loss.  EYES: No blurry or double vision.  ENT: No tinnitus. No postnasal drip. No redness of the oropharynx.  RESPIRATORY: No cough, no wheeze, no hemoptysis.  Positive dyspnea.  CARDIOVASCULAR: No chest pain. No orthopnea. No palpitations. No syncope.  GASTROINTESTINAL: No nausea, no vomiting or diarrhea. No abdominal pain. No melena or hematochezia.  GENITOURINARY: No dysuria or hematuria.  ENDOCRINE: No polyuria or nocturia. No heat or cold intolerance.  HEMATOLOGY: No anemia. No bruising. No bleeding.  INTEGUMENTARY: No rashes. No lesions.  MUSCULOSKELETAL: No arthritis. No swelling. No gout.  NEUROLOGIC: No numbness, tingling, or ataxia. No seizure-type activity.  PSYCHIATRIC: No anxiety. No insomnia. No ADD.    Vitals:   Vitals:   04/13/18 1415 04/13/18 1422 04/13/18 1426 04/13/18 1430  BP: (!) 162/88 102/78 (!) 139/93 (!) 164/83  Pulse: 81 82 81 80  Resp: 14 (!) 21 15 14   Temp:      TempSrc:      SpO2: 99% 99% 100% 100%  Weight:      Height:        Wt Readings from Last 3 Encounters:  04/12/18 112 kg  04/05/18 112.2 kg  03/28/18 111.6 kg     Intake/Output Summary (Last 24 hours) at 04/13/2018 1536 Last data filed at 04/13/2018 0600 Gross per 24 hour  Intake  440 ml  Output 1300 ml  Net -860 ml    Physical Exam:   GENERAL: Pleasant-appearing in no apparent distress.  HEAD, EYES, EARS, NOSE AND THROAT: Atraumatic, normocephalic. Extraocular muscles are intact. Pupils equal and reactive to light. Sclerae anicteric. No conjunctival injection. No oro-pharyngeal erythema.  NECK: Supple. There is no jugular venous distention. No bruits, no lymphadenopathy, no thyromegaly.  HEART: Regular rate and rhythm,. No murmurs, no rubs, no clicks.  LUNGS: Decreased breath sounds on the left lung ABDOMEN: Soft, flat, nontender, nondistended. Has good bowel sounds. No hepatosplenomegaly appreciated.  EXTREMITIES: No evidence  of any cyanosis, clubbing, or peripheral edema.  +2 pedal and radial pulses bilaterally.  NEUROLOGIC: The patient is alert, awake, and oriented x3 with no focal motor or sensory deficits appreciated bilaterally.  SKIN: Moist and warm with no rashes appreciated.  Psych: Not anxious, depressed LN: No inguinal LN enlargement    Antibiotics   Anti-infectives (From admission, onward)   None      Medications   Scheduled Meds: . ALPRAZolam  0.5 mg Oral BID  . aspirin EC  81 mg Oral Daily  . cholecalciferol  1,000 Units Oral Daily  . enoxaparin (LOVENOX) injection  40 mg Subcutaneous BID  . famotidine  20 mg Oral BID  . metoprolol succinate  25 mg Oral Daily  . mometasone-formoterol  2 puff Inhalation BID  . sodium chloride flush  5 mL Intracatheter Q8H   Continuous Infusions: . lactated ringers 75 mL/hr at 04/13/18 0834   PRN Meds:.acetaminophen **OR** acetaminophen, albuterol, ondansetron **OR** ondansetron (ZOFRAN) IV, oxyCODONE-acetaminophen   Data Review:   Micro Results No results found for this or any previous visit (from the past 240 hour(s)).  Radiology Reports Portable Chest 1 View  Result Date: 04/13/2018 CLINICAL DATA:  67 y/o  F; pneumothorax. EXAM: PORTABLE CHEST 1 VIEW COMPARISON:  04/12/2018 chest radiograph.  FINDINGS: Stable cardiac silhouette. No rightward mediastinal shift. Mild interval progression of the left-sided pneumothorax. Increasing atelectasis in the left lung. Nodular opacities projecting over the left lower lung zone again noted. No acute osseous abnormality identified. IMPRESSION: Mild interval progression of left-sided pneumothorax. No mediastinal shift. These results will be called to the ordering clinician or representative by the Radiologist Assistant, and communication documented in the PACS or zVision Dashboard. Electronically Signed   By: Kristine Garbe M.D.   On: 04/13/2018 06:28   Dg Chest Port 1 View  Result Date: 04/12/2018 CLINICAL DATA:  Status post bronchoscopy. EXAM: PORTABLE CHEST 1 VIEW COMPARISON:  Prior CTs and radiographs FINDINGS: The cardiomediastinal silhouette is unremarkable. A small LEFT apical pneumothorax is noted, approximately 15%. Nodular opacity overlying the mid-lower LEFT lung again noted. There is no evidence of pleural effusion. No other changes identified. IMPRESSION: Small LEFT apical pneumothorax, approximately 15%. Critical Value/emergent results were called by telephone at the time of interpretation on 04/12/2018 at 2:59 pm to [facility], nurse for this patient, who verbally acknowledged these results. Electronically Signed   By: Margarette Canada M.D.   On: 04/12/2018 14:59   Dg C-arm 1-60 Min-no Report  Result Date: 04/12/2018 Fluoroscopy was utilized by the requesting physician.  No radiographic interpretation.   Ct Image Guided Fluid Drain By Catheter  Result Date: 04/13/2018 INDICATION: Enlarging symptomatic left-sided pneumothorax following endoscopic biopsy. EXAM: CT-GUIDED LEFT-SIDED CHEST TUBE PLACEMENT COMPARISON:  Chest radiograph-earlier same day; 04/12/2018 MEDICATIONS: The patient is currently admitted to the hospital and receiving intravenous antibiotics. The antibiotics were administered within an appropriate time frame prior to the  initiation of the procedure. ANESTHESIA/SEDATION: Moderate (conscious) sedation was employed during this procedure. A total of Versed 150 mg and Fentanyl 3 mcg was administered intravenously. Moderate Sedation Time: 24 minutes. The patient's level of consciousness and vital signs were monitored continuously by radiology nursing throughout the procedure under my direct supervision. CONTRAST:  None COMPLICATIONS: None immediate. PROCEDURE: Informed written consent was obtained from the patient after a discussion of the risks, benefits and alternatives to treatment. The patient was placed supine on the CT gantry and a pre procedural CT was performed re-demonstrating the large left-sided pneumothorax. The  procedure was planned. A timeout was performed prior to the initiation of the procedure. The skin overlying the anterior aspect the left upper chest was prepped and draped in the usual sterile fashion. The overlying soft tissues were anesthetized with 1% lidocaine with epinephrine. Appropriate trajectory was planned with the use of a 22 gauge spinal needle. An 18 gauge trocar needle was advanced into the left pleural space and a short Amplatz super stiff wire was coiled within the apex of the left pleural space. Appropriate positioning was confirmed with a limited CT scan. The tract was serially dilated allowing placement of a 10 [place] all-purpose drainage catheter. Appropriate positioning was confirmed with a limited postprocedural CT scan. The drainage catheter was connected to a pleura vac and sutured in place. Postprocedural imaging demonstrated near complete evacuation of the left-sided pneumothorax. A dressing was placed. The patient tolerated the procedure well without immediate post procedural complication. IMPRESSION: Successful CT guided placement of a 10 French all purpose drain catheter into the apical aspect of the left pleural space for enlarging symptomatic left-sided pneumothorax. Electronically Signed    By: Sandi Mariscal M.D.   On: 04/13/2018 15:02   Ct Super D Chest Wo Contrast  Result Date: 04/12/2018 CLINICAL DATA:  Super D chest for biopsy planning. EXAM: CT CHEST WITHOUT CONTRAST TECHNIQUE: Multidetector CT imaging of the chest was performed using thin slice collimation for electromagnetic bronchoscopy planning purposes, without intravenous contrast. COMPARISON:  PET-CT 03/06/2018 and chest CT 12/06/2017. FINDINGS: Cardiovascular: The heart is normal in size. No pericardial effusion. Stable mild tortuosity and calcification of the thoracic aorta. Stable scattered coronary artery calcifications. Mediastinum/Nodes: No mediastinal or hilar mass or lymphadenopathy. The esophagus is grossly normal. Lungs/Pleura: Stable 18 mm ground-glass nodule in the left lower lobe on image number 29. No other pulmonary lesions are identified. No acute pulmonary findings. Scattered bullous changes. Upper Abdomen: No significant upper abdominal findings. Status post cholecystectomy without biliary dilatation. No hepatic or adrenal gland lesions are identified. Musculoskeletal: No worrisome breast lesions. Evidence of prior left breast surgery and possible radiation. No supraclavicular or axillary adenopathy. No significant bony findings. IMPRESSION: 1. Stable 18 mm ground-glass nodule in the left lower lobe. 2. No enlarged mediastinal or hilar lymph nodes. 3. Evidence of previous left breast surgery and possible radiation but no recurrent breast mass or left axillary adenopathy. Aortic Atherosclerosis (ICD10-I70.0) and Emphysema (ICD10-J43.9). Electronically Signed   By: Marijo Sanes M.D.   On: 04/12/2018 15:15     CBC Recent Labs  Lab 04/12/18 1712 04/13/18 1051  WBC 7.9  --   HGB 13.1  --   HCT 38.5  --   PLT 223 212  MCV 93.2  --   MCH 31.7  --   MCHC 34.1  --   RDW 13.3  --     Chemistries  Recent Labs  Lab 04/12/18 1712  CREATININE 0.79    ------------------------------------------------------------------------------------------------------------------ estimated creatinine clearance is 83.2 mL/min (by C-G formula based on SCr of 0.79 mg/dL). ------------------------------------------------------------------------------------------------------------------ No results for input(s): HGBA1C in the last 72 hours. ------------------------------------------------------------------------------------------------------------------ No results for input(s): CHOL, HDL, LDLCALC, TRIG, CHOLHDL, LDLDIRECT in the last 72 hours. ------------------------------------------------------------------------------------------------------------------ No results for input(s): TSH, T4TOTAL, T3FREE, THYROIDAB in the last 72 hours.  Invalid input(s): FREET3 ------------------------------------------------------------------------------------------------------------------ No results for input(s): VITAMINB12, FOLATE, FERRITIN, TIBC, IRON, RETICCTPCT in the last 72 hours.  Coagulation profile Recent Labs  Lab 04/13/18 1051  INR 0.92    No results for input(s): DDIMER in the last 72  hours.  Cardiac Enzymes No results for input(s): CKMB, TROPONINI, MYOGLOBIN in the last 168 hours.  Invalid input(s): CK ------------------------------------------------------------------------------------------------------------------ Invalid input(s): POCBNP    Assessment & Plan   67 year old female with past medical history of COPD, obstructive sleep apnea, spinal stenosis, hypothyroidism, hypertension, hyperlipidemia, GERD, history of breast cancer who presented to the hospital for an elective bronchoscopy due to a enlarging left lower lobe nodule and post bronchoscopy patient developed some chest pain and shortness of breath and noted to have a small pneumothorax.  1.  Pneumothorax status post bronchoscopy-  Patient's pneumothorax worsen had chest tube  placed   2.  History of COPD-no acute exacerbation. -Continue Dulera.  3.  Essential hypertension-continue Toprol.  4.  GERD-continue Pepcid.  5.  Anxiety-continue Xanax as needed     Code Status Orders  (From admission, onward)         Start     Ordered   04/12/18 1536  Full code  Continuous     04/12/18 1537        Code Status History    This patient has a current code status but no historical code status.           Consults   none  DVT Prophylaxis  Lovenox   Lab Results  Component Value Date   PLT 212 04/13/2018     Time Spent in minutes  46min Greater than 50% of time spent in care coordination and counseling patient regarding the condition and plan of care.   Dustin Flock M.D on 04/13/2018 at 3:36 PM  Between 7am to 6pm - Pager - [phone]  After 6pm go to [email or web address] - Proofreader  [employer]   Office  [phone]

## 2018-04-14 ENCOUNTER — Inpatient Hospital Stay: Payer: Medicare Other

## 2018-04-14 LAB — CBC
HCT: 32.2 % — ABNORMAL LOW (ref 35.0–47.0)
HEMOGLOBIN: 10.9 g/dL — AB (ref 12.0–16.0)
MCH: 32.2 pg (ref 26.0–34.0)
MCHC: 34 g/dL (ref 32.0–36.0)
MCV: 94.7 fL (ref 80.0–100.0)
Platelets: 187 10*3/uL (ref 150–440)
RBC: 3.4 MIL/uL — AB (ref 3.80–5.20)
RDW: 13.2 % (ref 11.5–14.5)
WBC: 5.8 10*3/uL (ref 3.6–11.0)

## 2018-04-14 LAB — BASIC METABOLIC PANEL
Anion gap: 8 (ref 5–15)
BUN: 17 mg/dL (ref 8–23)
CO2: 27 mmol/L (ref 22–32)
Calcium: 8.6 mg/dL — ABNORMAL LOW (ref 8.9–10.3)
Chloride: 105 mmol/L (ref 98–111)
Creatinine, Ser: 0.71 mg/dL (ref 0.44–1.00)
GFR calc Af Amer: >60 mL/min (ref >60)
GFR calc non Af Amer: >60 mL/min (ref >60)
Glucose, Bld: 99 mg/dL (ref 70–99)
Potassium: 4.2 mmol/L (ref 3.5–5.1)
Sodium: 140 mmol/L (ref 135–145)

## 2018-04-14 MED ORDER — IPRATROPIUM-ALBUTEROL 0.5-2.5 (3) MG/3ML IN SOLN
3.0000 mL | Freq: Four times a day (QID) | RESPIRATORY_TRACT | Status: DC
  Administered 2018-04-14 – 2018-04-15 (×5): 3 mL via RESPIRATORY_TRACT
  Filled 2018-04-14 (×5): qty 3

## 2018-04-14 NOTE — Progress Notes (Addendum)
Name: Michelle Pitts MRN: [id number] DOB: 1950/11/11     CONSULTATION DATE: 04/12/2018  Subjective & objective: Patient feels better, denies respiratory distress and no major issues last night.  PAST MEDICAL HISTORY :   has a past medical history of Anxiety, Breast cancer ([place]) (01/23/2014), Cancer ([place]) (1995), COPD (chronic obstructive pulmonary disease) ([place]), Dysrhythmia, GERD (gastroesophageal reflux disease), History of colon polyps, Hypercholesterolemia, Hypertension, Hypothyroidism, Insomnia, Papilloma of breast (01/09/2014), Personal history of radiation therapy, Personal history of tobacco use, presenting hazards to health (11/18/2015), Sleep apnea, Sleep apnea, SOB (shortness of breath) on exertion, Spinal stenosis, Thyroid disease, and Vitamin D deficiency.  has a past surgical history that includes Cholecystectomy; Bladder suspension (2001); Rotator cuff repair (Right, 1995); wide excision debriedment (Left, 04/10/14); Vaginal hysterectomy (1978); Colonoscopy (2014); Breast surgery (Left, 01/23/14); Breast surgery (04/10/14); Breast lumpectomy (Left, 01/23/2014); Colonoscopy with propofol (N/A, 10/10/2016); Breast cyst aspiration (Left, 1985); Breast biopsy (Right, 1975, 1985); Breast biopsy (Right, 01/03/2014); Breast biopsy (Left, 01/03/2014); Breast biopsy (Left, 01/13/2015); Vulvectomy partial (1995); and Electormagnetic navigation bronchoscopy (N/A, 04/12/2018). Prior to Admission medications   Medication Sig Start Date End Date Taking? Authorizing Provider  albuterol (PROAIR HFA) 108 (90 Base) MCG/ACT inhaler Inhale 2 puffs into the lungs every 6 (six) hours as needed for wheezing or shortness of breath. 03/21/17  Yes Mar Daring, PA-C  ALPRAZolam Duanne Moron) 0.5 MG tablet Take 1 tablet (0.5 mg total) by mouth 2 (two) times daily. As needed. 11/30/17  Yes Mar Daring, PA-C  aspirin EC 81 MG tablet Take 81 mg by mouth daily.   Yes [provider]  cholecalciferol  (VITAMIN D) 1000 units tablet Take 1 tablet (1,000 Units total) by mouth daily. 02/10/16  Yes Margarita Rana, MD  fluticasone [facility]) 50 MCG/ACT nasal spray Place 2 sprays into both nostrils 2 (two) times daily as needed for allergies or rhinitis.   Yes [provider]  Fluticasone-Salmeterol (ADVAIR) 250-50 MCG/DOSE AEPB Inhale 1 puff into the lungs 2 (two) times daily. 08/02/17  Yes Mar Daring, PA-C  ketotifen (ZADITOR) 0.025 % ophthalmic solution Place 1 drop into both eyes 2 (two) times daily as needed.   Yes [provider]  metoprolol succinate (TOPROL-XL) 25 MG 24 hr tablet Take 1 tablet (25 mg total) by mouth daily. 10/04/17  Yes Mar Daring, PA-C  naproxen sodium (ALEVE) 220 MG tablet Take 220 mg by mouth daily as needed.   Yes [provider]  ranitidine (ZANTAC) 150 MG tablet Take 150 mg by mouth 2 (two) times daily.   Yes [provider]   Allergies  Allergen Reactions  . Codeine Nausea And Vomiting  . Hydrochlorothiazide Swelling    Swelling of tongue.  [place] [Levofloxacin In D5w] Hives and Swelling    Leg swelling     FAMILY HISTORY:  family history includes COPD in her mother; Cancer in her other; Cancer (age of onset: 15) in her maternal grandfather; Cancer (age of onset: 56) in her paternal grandfather; Crohn's disease in her sister; Diabetes in her father and sister; Emphysema in her brother, brother, and sister; Heart failure in her sister; Hypertension in her sister, sister, and sister; Lung cancer in her brother. SOCIAL HISTORY:  reports that she quit smoking about 3 years ago. Her smoking use included cigarettes. She has a 50.00 pack-year smoking history. She has never used smokeless tobacco. She reports that she drinks about 14.0 standard drinks of alcohol per week. She reports that she does not  use drugs.  REVIEW OF SYSTEMS:   Unable to obtain due to critical illness   VITAL SIGNS: Temp:  [97.6 F (36.4  C)-98.1 F (36.7 C)] 97.6 F (36.4 C) (08/31 0457) Pulse Rate:  [64-84] 64 (08/31 0459) Resp:  [12-21] 16 (08/31 0457) BP: (102-164)/(46-93) 122/66 (08/31 0459) SpO2:  [97 %-100 %] 99 % (08/31 1300)  Physical Examination:  Awake and oriented with no acute neurological deficits Tolerating nasal cannula, no distress, able to talk in full sentences, bilateral equal air entry with no adventitious sounds.  Small chest tube in place left mid clavicular line on Pleur-evac to water seal. S1 & S2 are audible with no murmur Benign abdominal exam with normal peristalsis No leg edema  ASSESSMENT / PLAN:  Acute respiratory failure tolerating nasal cannula -Monitor work of breathing and O2 sat  Left pneumothorax status post navigation bronchoscope on 04/12/2018.  Left chest drain in place, no air leak.  Follow-up x-ray showed complete resolution of pneumothorax -Pleur-evac to low wall suction when the amount of water -Monitor chest x-ray and consider trial clamping in a.m. if no residual pneumothorax.  Questionable lung nodule with groundglass opacification on imaging study status post biopsy -Follow with biopsy report and management as pulmonary  COPD -Optimize bronchodilators  Hypertension -Optimize antihypertensives and monitor hemodynamics  Continue with medical management as per primary group  Partial code DO NOT INTUBATE  DVT & GI prophylaxis.  Continue with supportive care  Critical care time 20 minutes

## 2018-04-14 NOTE — Progress Notes (Signed)
[facility]                                                                                                                                                                                  Patient Demographics   Michelle Pitts, is a 67 y.o. female, DOB - [birth year], EVO:[id number]  Admit date - 04/12/2018   Admitting Physician Flora Lipps, MD  Outpatient Primary MD for the patient is Mar Daring, PA-C   LOS - 1  Subjective: Patient admitted after lung biopsy noted to have pneumothorax now has expansion of pneumothorax, patient had chest tube placed by interventional radiology Patient is feeling better denies any complaint  Review of Systems:   CONSTITUTIONAL: No documented fever. No fatigue, weakness. No weight gain, no weight loss.  EYES: No blurry or double vision.  ENT: No tinnitus. No postnasal drip. No redness of the oropharynx.  RESPIRATORY: No cough, no wheeze, no hemoptysis.  Positive dyspnea.  CARDIOVASCULAR: No chest pain. No orthopnea. No palpitations. No syncope.  GASTROINTESTINAL: No nausea, no vomiting or diarrhea. No abdominal pain. No melena or hematochezia.  GENITOURINARY: No dysuria or hematuria.  ENDOCRINE: No polyuria or nocturia. No heat or cold intolerance.  HEMATOLOGY: No anemia. No bruising. No bleeding.  INTEGUMENTARY: No rashes. No lesions.  MUSCULOSKELETAL: No arthritis. No swelling. No gout.  NEUROLOGIC: No numbness, tingling, or ataxia. No seizure-type activity.  PSYCHIATRIC: No anxiety. No insomnia. No ADD.    Vitals:   Vitals:   04/13/18 1430 04/13/18 2025 04/14/18 0457 04/14/18 0459  BP: (!) 164/83 (!) 154/79 (!) 105/46 122/66  Pulse: 80 80 65 64  Resp: 16 16 16    Temp:  98.1 F (36.7 C) 97.6 F (36.4 C)   TempSrc:  Oral Oral   SpO2: 100% 100% 100%   Weight:      Height:        Wt Readings from Last 3 Encounters:  04/12/18 112 kg  04/05/18 112.2 kg  03/28/18 111.6 kg      Intake/Output Summary (Last 24 hours) at 04/14/2018 1224 Last data filed at 04/14/2018 1150 Gross per 24 hour  Intake 2271.63 ml  Output 1050 ml  Net 1221.63 ml    Physical Exam:   GENERAL: Pleasant-appearing in no apparent distress.  HEAD, EYES, EARS, NOSE AND THROAT: Atraumatic, normocephalic. Extraocular muscles are intact. Pupils equal and reactive to light. Sclerae anicteric. No conjunctival injection. No oro-pharyngeal erythema.  NECK: Supple. There is no jugular venous distention. No bruits, no lymphadenopathy, no thyromegaly.  HEART: Regular rate and rhythm,. No murmurs, no rubs, no clicks.  LUNGS: D bilateral wheezing  aBDOMEN: Soft, flat, nontender, nondistended.  Has good bowel sounds. No hepatosplenomegaly appreciated.  EXTREMITIES: No evidence of any cyanosis, clubbing, or peripheral edema.  +2 pedal and radial pulses bilaterally.  NEUROLOGIC: The patient is alert, awake, and oriented x3 with no focal motor or sensory deficits appreciated bilaterally.  SKIN: Moist and warm with no rashes appreciated.  Psych: Not anxious, depressed LN: No inguinal LN enlargement    Antibiotics   Anti-infectives (From admission, onward)   None      Medications   Scheduled Meds: . ALPRAZolam  0.5 mg Oral BID  . aspirin EC  81 mg Oral Daily  . cholecalciferol  1,000 Units Oral Daily  . enoxaparin (LOVENOX) injection  40 mg Subcutaneous BID  . famotidine  20 mg Oral BID  . metoprolol succinate  25 mg Oral Daily  . mometasone-formoterol  2 puff Inhalation BID  . sodium chloride flush  5 mL Intracatheter Q8H   Continuous Infusions: . lactated ringers 75 mL/hr at 04/14/18 0757   PRN Meds:.acetaminophen **OR** acetaminophen, albuterol, ondansetron **OR** ondansetron (ZOFRAN) IV, oxyCODONE-acetaminophen   Data Review:   Micro Results No results found for this or any previous visit (from the past 240 hour(s)).  Radiology Reports Dg Chest Port 1 View  Result Date:  04/14/2018 CLINICAL DATA:  Follow-up pneumothorax EXAM: PORTABLE CHEST 1 VIEW COMPARISON:  Yesterday FINDINGS: Normal heart size and mediastinal contours. There is bronchitic airway thickening that is chronic. No visible pneumothorax. Stable left chest tube positioning. Normal heart size. IMPRESSION: No visible pneumothorax. Electronically Signed   By: Monte Fantasia M.D.   On: 04/14/2018 08:36   Dg Chest Port 1 View  Result Date: 04/13/2018 CLINICAL DATA:  Left-sided chest tube placement. EXAM: PORTABLE CHEST 1 VIEW COMPARISON:  One-view chest x-ray of the same day at 6 o'clock a.m. FINDINGS: Heart size is normal. A left-sided pigtail drainage catheter has been placed in the interval. The left lung is re-expanded. No significant residual pneumothorax is present. Left basilar airspace disease likely reflects atelectasis. IMPRESSION: 1. Re-expansion of the left lung following placement of a pigtail all purpose drainage catheter. Electronically Signed   By: San Morelle M.D.   On: 04/13/2018 15:38   Portable Chest 1 View  Result Date: 04/13/2018 CLINICAL DATA:  66 y/o  F; pneumothorax. EXAM: PORTABLE CHEST 1 VIEW COMPARISON:  04/12/2018 chest radiograph. FINDINGS: Stable cardiac silhouette. No rightward mediastinal shift. Mild interval progression of the left-sided pneumothorax. Increasing atelectasis in the left lung. Nodular opacities projecting over the left lower lung zone again noted. No acute osseous abnormality identified. IMPRESSION: Mild interval progression of left-sided pneumothorax. No mediastinal shift. These results will be called to the ordering clinician or representative by the Radiologist Assistant, and communication documented in the PACS or zVision Dashboard. Electronically Signed   By: Kristine Garbe M.D.   On: 04/13/2018 06:28   Dg Chest Port 1 View  Result Date: 04/12/2018 CLINICAL DATA:  Status post bronchoscopy. EXAM: PORTABLE CHEST 1 VIEW COMPARISON:  Prior CTs  and radiographs FINDINGS: The cardiomediastinal silhouette is unremarkable. A small LEFT apical pneumothorax is noted, approximately 15%. Nodular opacity overlying the mid-lower LEFT lung again noted. There is no evidence of pleural effusion. No other changes identified. IMPRESSION: Small LEFT apical pneumothorax, approximately 15%. Critical Value/emergent results were called by telephone at the time of interpretation on 04/12/2018 at 2:59 pm to [facility], nurse for this patient, who verbally acknowledged these results. Electronically Signed   By: Margarette Canada M.D.   On: 04/12/2018 14:59  Dg C-arm 1-60 Min-no Report  Result Date: 04/12/2018 Fluoroscopy was utilized by the requesting physician.  No radiographic interpretation.   Ct Image Guided Fluid Drain By Catheter  Result Date: 04/13/2018 INDICATION: Enlarging symptomatic left-sided pneumothorax following endoscopic biopsy. EXAM: CT-GUIDED LEFT-SIDED CHEST TUBE PLACEMENT COMPARISON:  Chest radiograph-earlier same day; 04/12/2018 MEDICATIONS: The patient is currently admitted to the hospital and receiving intravenous antibiotics. The antibiotics were administered within an appropriate time frame prior to the initiation of the procedure. ANESTHESIA/SEDATION: Moderate (conscious) sedation was employed during this procedure. A total of Versed 150 mg and Fentanyl 3 mcg was administered intravenously. Moderate Sedation Time: 24 minutes. The patient's level of consciousness and vital signs were monitored continuously by radiology nursing throughout the procedure under my direct supervision. CONTRAST:  None COMPLICATIONS: None immediate. PROCEDURE: Informed written consent was obtained from the patient after a discussion of the risks, benefits and alternatives to treatment. The patient was placed supine on the CT gantry and a pre procedural CT was performed re-demonstrating the large left-sided pneumothorax. The procedure was planned. A timeout was performed prior  to the initiation of the procedure. The skin overlying the anterior aspect the left upper chest was prepped and draped in the usual sterile fashion. The overlying soft tissues were anesthetized with 1% lidocaine with epinephrine. Appropriate trajectory was planned with the use of a 22 gauge spinal needle. An 18 gauge trocar needle was advanced into the left pleural space and a short Amplatz super stiff wire was coiled within the apex of the left pleural space. Appropriate positioning was confirmed with a limited CT scan. The tract was serially dilated allowing placement of a 10 [place] all-purpose drainage catheter. Appropriate positioning was confirmed with a limited postprocedural CT scan. The drainage catheter was connected to a pleura vac and sutured in place. Postprocedural imaging demonstrated near complete evacuation of the left-sided pneumothorax. A dressing was placed. The patient tolerated the procedure well without immediate post procedural complication. IMPRESSION: Successful CT guided placement of a 10 French all purpose drain catheter into the apical aspect of the left pleural space for enlarging symptomatic left-sided pneumothorax. Electronically Signed   By: Sandi Mariscal M.D.   On: 04/13/2018 15:02   Ct Super D Chest Wo Contrast  Result Date: 04/12/2018 CLINICAL DATA:  Super D chest for biopsy planning. EXAM: CT CHEST WITHOUT CONTRAST TECHNIQUE: Multidetector CT imaging of the chest was performed using thin slice collimation for electromagnetic bronchoscopy planning purposes, without intravenous contrast. COMPARISON:  PET-CT 03/06/2018 and chest CT 12/06/2017. FINDINGS: Cardiovascular: The heart is normal in size. No pericardial effusion. Stable mild tortuosity and calcification of the thoracic aorta. Stable scattered coronary artery calcifications. Mediastinum/Nodes: No mediastinal or hilar mass or lymphadenopathy. The esophagus is grossly normal. Lungs/Pleura: Stable 18 mm ground-glass nodule in  the left lower lobe on image number 29. No other pulmonary lesions are identified. No acute pulmonary findings. Scattered bullous changes. Upper Abdomen: No significant upper abdominal findings. Status post cholecystectomy without biliary dilatation. No hepatic or adrenal gland lesions are identified. Musculoskeletal: No worrisome breast lesions. Evidence of prior left breast surgery and possible radiation. No supraclavicular or axillary adenopathy. No significant bony findings. IMPRESSION: 1. Stable 18 mm ground-glass nodule in the left lower lobe. 2. No enlarged mediastinal or hilar lymph nodes. 3. Evidence of previous left breast surgery and possible radiation but no recurrent breast mass or left axillary adenopathy. Aortic Atherosclerosis (ICD10-I70.0) and Emphysema (ICD10-J43.9). Electronically Signed   By: Ricky Stabs.D.  On: 04/12/2018 15:15     CBC Recent Labs  Lab 04/12/18 1712 04/13/18 1051 04/14/18 0435  WBC 7.9  --  5.8  HGB 13.1  --  10.9*  HCT 38.5  --  32.2*  PLT 223 212 187  MCV 93.2  --  94.7  MCH 31.7  --  32.2  MCHC 34.1  --  34.0  RDW 13.3  --  13.2    Chemistries  Recent Labs  Lab 04/12/18 1712 04/14/18 0435  NA  --  140  K  --  4.2  CL  --  105  CO2  --  27  GLUCOSE  --  99  BUN  --  17  CREATININE 0.79 0.71  CALCIUM  --  8.6*   ------------------------------------------------------------------------------------------------------------------ estimated creatinine clearance is 83.2 mL/min (by C-G formula based on SCr of 0.71 mg/dL). ------------------------------------------------------------------------------------------------------------------ No results for input(s): HGBA1C in the last 72 hours. ------------------------------------------------------------------------------------------------------------------ No results for input(s): CHOL, HDL, LDLCALC, TRIG, CHOLHDL, LDLDIRECT in the last 72  hours. ------------------------------------------------------------------------------------------------------------------ No results for input(s): TSH, T4TOTAL, T3FREE, THYROIDAB in the last 72 hours.  Invalid input(s): FREET3 ------------------------------------------------------------------------------------------------------------------ No results for input(s): VITAMINB12, FOLATE, FERRITIN, TIBC, IRON, RETICCTPCT in the last 72 hours.  Coagulation profile Recent Labs  Lab 04/13/18 1051  INR 0.92    No results for input(s): DDIMER in the last 72 hours.  Cardiac Enzymes No results for input(s): CKMB, TROPONINI, MYOGLOBIN in the last 168 hours.  Invalid input(s): CK ------------------------------------------------------------------------------------------------------------------ Invalid input(s): POCBNP    Assessment & Plan   67 year old female with past medical history of COPD, obstructive sleep apnea, spinal stenosis, hypothyroidism, hypertension, hyperlipidemia, GERD, history of breast cancer who presented to the hospital for an elective bronchoscopy due to a enlarging left lower lobe nodule and post bronchoscopy patient developed some chest pain and shortness of breath and noted to have a small pneumothorax.  1.  Pneumothorax status post bronchoscopy-  Patient's pneumothorax worsen had chest tube placed, was seen by intensivist today chest x-ray shows resolution of the pneumothorax, we will allow pulmonary to guide [place] in further treatment   2.  History of COPD- with wheezing I will add nebs -Continue Dulera.  3.  Essential hypertension-continue Toprol.  4.  GERD-continue Pepcid.  5.  Anxiety-continue Xanax as needed     Code Status Orders  (From admission, onward)         Start     Ordered   04/12/18 1536  Full code  Continuous     04/12/18 1537        Code Status History    This patient has a current code status but no historical code status.            Consults   none  DVT Prophylaxis  Lovenox   Lab Results  Component Value Date   PLT 187 04/14/2018     Time Spent in minutes  17min Greater than 50% of time spent in care coordination and counseling patient regarding the condition and plan of care.   Dustin Flock M.D on 04/14/2018 at 12:24 PM  Between 7am to 6pm - Pager - [phone]  After 6pm go to [email or web address] - Proofreader  [employer]   Office  [phone]

## 2018-04-14 NOTE — Progress Notes (Signed)
IS education done, pt and spouse understands directions and need for use. Pt inspire 750 ml

## 2018-04-14 NOTE — Progress Notes (Signed)
Advanced care plan.  Purpose of the Encounter: CODE STATUS  Parties in Attendance: Patient herself  Patient's Decision Capacity: Intact  Subjective/Patient's story:  Patient is a 67 year old with history of hypothyroidism hypertension hyperlipidemia COPD who was admitted after lung biopsy noted to have pneumothorax  Objective/Medical story  I discussed with the patient regarding her desires for cardiac and pulmonary resuscitation.  Patient states that she does have a DNR at home however after discussing with her regarding CODE STATUS she understands better and states that she would want cardiac resuscitation but no pulmonary resuscitation  Goals of care determination: Limited code    CODE STATUS: Limited code   Time spent discussing advanced care planning: 16 minutes

## 2018-04-15 ENCOUNTER — Inpatient Hospital Stay: Payer: Medicare Other

## 2018-04-15 MED ORDER — IPRATROPIUM-ALBUTEROL 0.5-2.5 (3) MG/3ML IN SOLN: 3.0000 mL | RESPIRATORY_TRACT | Status: DC | PRN

## 2018-04-15 MED ORDER — POLYETHYLENE GLYCOL 3350 17 G PO PACK
17.0000 g | PACK | Freq: Every day | ORAL | Status: DC
  Administered 2018-04-15 – 2018-04-16 (×2): 17 g via ORAL
  Filled 2018-04-15 (×2): qty 1

## 2018-04-15 MED ORDER — DOXYCYCLINE HYCLATE 100 MG PO TABS
100.0000 mg | ORAL_TABLET | Freq: Two times a day (BID) | ORAL | Status: DC
  Administered 2018-04-15 – 2018-04-16 (×2): 100 mg via ORAL
  Filled 2018-04-15 (×2): qty 1

## 2018-04-15 MED ORDER — DOCUSATE SODIUM 100 MG PO CAPS
200.0000 mg | ORAL_CAPSULE | Freq: Two times a day (BID) | ORAL | Status: DC
  Administered 2018-04-15 – 2018-04-16 (×2): 200 mg via ORAL
  Filled 2018-04-15 (×2): qty 2

## 2018-04-15 NOTE — Progress Notes (Signed)
[place] at [facility]                                                                                                                                                                                  Patient Demographics   Anniebelle Devore, is a 67 y.o. female, DOB - 1950-09-11, EGB:[id number]  Admit date - 04/12/2018   Admitting Physician Flora Lipps, MD  Outpatient Primary MD for the patient is Mar Daring, PA-C   LOS - 2  Subjective:  pt doing better , c/o cough which is producive  Review of Systems:   CONSTITUTIONAL: No documented fever. No fatigue, weakness. No weight gain, no weight loss.  EYES: No blurry or double vision.  ENT: No tinnitus. No postnasal drip. No redness of the oropharynx.  RESPIRATORY: Positive  cough, no wheeze, no hemoptysis.  Positive dyspnea.  CARDIOVASCULAR: No chest pain. No orthopnea. No palpitations. No syncope.  GASTROINTESTINAL: No nausea, no vomiting or diarrhea. No abdominal pain. No melena or hematochezia.  GENITOURINARY: No dysuria or hematuria.  ENDOCRINE: No polyuria or nocturia. No heat or cold intolerance.  HEMATOLOGY: No anemia. No bruising. No bleeding.  INTEGUMENTARY: No rashes. No lesions.  MUSCULOSKELETAL: No arthritis. No swelling. No gout.  NEUROLOGIC: No numbness, tingling, or ataxia. No seizure-type activity.  PSYCHIATRIC: No anxiety. No insomnia. No ADD.    Vitals:   Vitals:   04/15/18 0216 04/15/18 0354 04/15/18 0807 04/15/18 1018  BP:  132/61  137/71  Pulse:  84  87  Resp:  18    Temp:  97.9 F (36.6 C)    TempSrc:  Oral    SpO2: 96% 97% 95%   Weight:      Height:        Wt Readings from Last 3 Encounters:  04/12/18 112 kg  04/05/18 112.2 kg  03/28/18 111.6 kg     Intake/Output Summary (Last 24 hours) at 04/15/2018 1314 Last data filed at 04/15/2018 1022 Gross per 24 hour  Intake 645.96 ml  Output 1153 ml  Net -507.04 ml    Physical Exam:   GENERAL: Pleasant-appearing  in no apparent distress.  HEAD, EYES, EARS, NOSE AND THROAT: Atraumatic, normocephalic. Extraocular muscles are intact. Pupils equal and reactive to light. Sclerae anicteric. No conjunctival injection. No oro-pharyngeal erythema.  NECK: Supple. There is no jugular venous distention. No bruits, no lymphadenopathy, no thyromegaly.  HEART: Regular rate and rhythm,. No murmurs, no rubs, no clicks.  LUNGS: D bilateral wheezing  aBDOMEN: Soft, flat, nontender, nondistended. Has good bowel sounds. No hepatosplenomegaly appreciated.  EXTREMITIES: No evidence of any cyanosis, clubbing, or peripheral edema.  +2 pedal and radial pulses  bilaterally.  NEUROLOGIC: The patient is alert, awake, and oriented x3 with no focal motor or sensory deficits appreciated bilaterally.  SKIN: Moist and warm with no rashes appreciated.  Psych: Not anxious, depressed LN: No inguinal LN enlargement    Antibiotics   Anti-infectives (From admission, onward)   None      Medications   Scheduled Meds: . ALPRAZolam  0.5 mg Oral BID  . aspirin EC  81 mg Oral Daily  . cholecalciferol  1,000 Units Oral Daily  . enoxaparin (LOVENOX) injection  40 mg Subcutaneous BID  . famotidine  20 mg Oral BID  . ipratropium-albuterol  3 mL Nebulization Q6H  . metoprolol succinate  25 mg Oral Daily  . mometasone-formoterol  2 puff Inhalation BID  . sodium chloride flush  5 mL Intracatheter Q8H   Continuous Infusions:  PRN Meds:.acetaminophen **OR** acetaminophen, albuterol, ondansetron **OR** ondansetron (ZOFRAN) IV, oxyCODONE-acetaminophen   Data Review:   Micro Results No results found for this or any previous visit (from the past 240 hour(s)).  Radiology Reports Dg Chest Port 1 View  Result Date: 04/15/2018 CLINICAL DATA:  Patient status post bronchoscopy 04/12/2018. Left chest tube in place. EXAM: PORTABLE CHEST 1 VIEW COMPARISON:  Single-view of the chest 04/14/2018 and 04/13/2018. CT chest 04/12/2018. FINDINGS: Pigtail  catheter remains in place in the left chest. No pneumothorax. Lungs are clear. Heart size is normal. No pleural effusion. No acute bony abnormality. IMPRESSION: No acute disease. Negative for pneumothorax with a left chest tube in place. Electronically Signed   By: Inge Rise M.D.   On: 04/15/2018 09:09   Dg Chest Port 1 View  Result Date: 04/14/2018 CLINICAL DATA:  Follow-up pneumothorax EXAM: PORTABLE CHEST 1 VIEW COMPARISON:  Yesterday FINDINGS: Normal heart size and mediastinal contours. There is bronchitic airway thickening that is chronic. No visible pneumothorax. Stable left chest tube positioning. Normal heart size. IMPRESSION: No visible pneumothorax. Electronically Signed   By: Monte Fantasia M.D.   On: 04/14/2018 08:36   Dg Chest Port 1 View  Result Date: 04/13/2018 CLINICAL DATA:  Left-sided chest tube placement. EXAM: PORTABLE CHEST 1 VIEW COMPARISON:  One-view chest x-ray of the same day at 6 o'clock a.m. FINDINGS: Heart size is normal. A left-sided pigtail drainage catheter has been placed in the interval. The left lung is re-expanded. No significant residual pneumothorax is present. Left basilar airspace disease likely reflects atelectasis. IMPRESSION: 1. Re-expansion of the left lung following placement of a pigtail all purpose drainage catheter. Electronically Signed   By: San Morelle M.D.   On: 04/13/2018 15:38   Portable Chest 1 View  Result Date: 04/13/2018 CLINICAL DATA:  67 y/o  F; pneumothorax. EXAM: PORTABLE CHEST 1 VIEW COMPARISON:  04/12/2018 chest radiograph. FINDINGS: Stable cardiac silhouette. No rightward mediastinal shift. Mild interval progression of the left-sided pneumothorax. Increasing atelectasis in the left lung. Nodular opacities projecting over the left lower lung zone again noted. No acute osseous abnormality identified. IMPRESSION: Mild interval progression of left-sided pneumothorax. No mediastinal shift. These results will be called to the  ordering clinician or representative by the Radiologist Assistant, and communication documented in the PACS or zVision Dashboard. Electronically Signed   By: Kristine Garbe M.D.   On: 04/13/2018 06:28   Dg Chest Port 1 View  Result Date: 04/12/2018 CLINICAL DATA:  Status post bronchoscopy. EXAM: PORTABLE CHEST 1 VIEW COMPARISON:  Prior CTs and radiographs FINDINGS: The cardiomediastinal silhouette is unremarkable. A small LEFT apical pneumothorax is noted, approximately 15%. Nodular  opacity overlying the mid-lower LEFT lung again noted. There is no evidence of pleural effusion. No other changes identified. IMPRESSION: Small LEFT apical pneumothorax, approximately 15%. Critical Value/emergent results were called by telephone at the time of interpretation on 04/12/2018 at 2:59 pm to [facility], nurse for this patient, who verbally acknowledged these results. Electronically Signed   By: Margarette Canada M.D.   On: 04/12/2018 14:59   Dg C-arm 1-60 Min-no Report  Result Date: 04/12/2018 Fluoroscopy was utilized by the requesting physician.  No radiographic interpretation.   Ct Image Guided Fluid Drain By Catheter  Result Date: 04/13/2018 INDICATION: Enlarging symptomatic left-sided pneumothorax following endoscopic biopsy. EXAM: CT-GUIDED LEFT-SIDED CHEST TUBE PLACEMENT COMPARISON:  Chest radiograph-earlier same day; 04/12/2018 MEDICATIONS: The patient is currently admitted to the hospital and receiving intravenous antibiotics. The antibiotics were administered within an appropriate time frame prior to the initiation of the procedure. ANESTHESIA/SEDATION: Moderate (conscious) sedation was employed during this procedure. A total of Versed 150 mg and Fentanyl 3 mcg was administered intravenously. Moderate Sedation Time: 24 minutes. The patient's level of consciousness and vital signs were monitored continuously by radiology nursing throughout the procedure under my direct supervision. CONTRAST:  None  COMPLICATIONS: None immediate. PROCEDURE: Informed written consent was obtained from the patient after a discussion of the risks, benefits and alternatives to treatment. The patient was placed supine on the CT gantry and a pre procedural CT was performed re-demonstrating the large left-sided pneumothorax. The procedure was planned. A timeout was performed prior to the initiation of the procedure. The skin overlying the anterior aspect the left upper chest was prepped and draped in the usual sterile fashion. The overlying soft tissues were anesthetized with 1% lidocaine with epinephrine. Appropriate trajectory was planned with the use of a 22 gauge spinal needle. An 18 gauge trocar needle was advanced into the left pleural space and a short Amplatz super stiff wire was coiled within the apex of the left pleural space. Appropriate positioning was confirmed with a limited CT scan. The tract was serially dilated allowing placement of a 10 [place] all-purpose drainage catheter. Appropriate positioning was confirmed with a limited postprocedural CT scan. The drainage catheter was connected to a pleura vac and sutured in place. Postprocedural imaging demonstrated near complete evacuation of the left-sided pneumothorax. A dressing was placed. The patient tolerated the procedure well without immediate post procedural complication. IMPRESSION: Successful CT guided placement of a 10 French all purpose drain catheter into the apical aspect of the left pleural space for enlarging symptomatic left-sided pneumothorax. Electronically Signed   By: Sandi Mariscal M.D.   On: 04/13/2018 15:02   Ct Super D Chest Wo Contrast  Result Date: 04/12/2018 CLINICAL DATA:  Super D chest for biopsy planning. EXAM: CT CHEST WITHOUT CONTRAST TECHNIQUE: Multidetector CT imaging of the chest was performed using thin slice collimation for electromagnetic bronchoscopy planning purposes, without intravenous contrast. COMPARISON:  PET-CT 03/06/2018 and  chest CT 12/06/2017. FINDINGS: Cardiovascular: The heart is normal in size. No pericardial effusion. Stable mild tortuosity and calcification of the thoracic aorta. Stable scattered coronary artery calcifications. Mediastinum/Nodes: No mediastinal or hilar mass or lymphadenopathy. The esophagus is grossly normal. Lungs/Pleura: Stable 18 mm ground-glass nodule in the left lower lobe on image number 29. No other pulmonary lesions are identified. No acute pulmonary findings. Scattered bullous changes. Upper Abdomen: No significant upper abdominal findings. Status post cholecystectomy without biliary dilatation. No hepatic or adrenal gland lesions are identified. Musculoskeletal: No worrisome breast lesions. Evidence of prior left  breast surgery and possible radiation. No supraclavicular or axillary adenopathy. No significant bony findings. IMPRESSION: 1. Stable 18 mm ground-glass nodule in the left lower lobe. 2. No enlarged mediastinal or hilar lymph nodes. 3. Evidence of previous left breast surgery and possible radiation but no recurrent breast mass or left axillary adenopathy. Aortic Atherosclerosis (ICD10-I70.0) and Emphysema (ICD10-J43.9). Electronically Signed   By: Marijo Sanes M.D.   On: 04/12/2018 15:15     CBC Recent Labs  Lab 04/12/18 1712 04/13/18 1051 04/14/18 0435  WBC 7.9  --  5.8  HGB 13.1  --  10.9*  HCT 38.5  --  32.2*  PLT 223 212 187  MCV 93.2  --  94.7  MCH 31.7  --  32.2  MCHC 34.1  --  34.0  RDW 13.3  --  13.2    Chemistries  Recent Labs  Lab 04/12/18 1712 04/14/18 0435  NA  --  140  K  --  4.2  CL  --  105  CO2  --  27  GLUCOSE  --  99  BUN  --  17  CREATININE 0.79 0.71  CALCIUM  --  8.6*   ------------------------------------------------------------------------------------------------------------------ estimated creatinine clearance is 83.2 mL/min (by C-G formula based on SCr of 0.71  mg/dL). ------------------------------------------------------------------------------------------------------------------ No results for input(s): HGBA1C in the last 72 hours. ------------------------------------------------------------------------------------------------------------------ No results for input(s): CHOL, HDL, LDLCALC, TRIG, CHOLHDL, LDLDIRECT in the last 72 hours. ------------------------------------------------------------------------------------------------------------------ No results for input(s): TSH, T4TOTAL, T3FREE, THYROIDAB in the last 72 hours.  Invalid input(s): FREET3 ------------------------------------------------------------------------------------------------------------------ No results for input(s): VITAMINB12, FOLATE, FERRITIN, TIBC, IRON, RETICCTPCT in the last 72 hours.  Coagulation profile Recent Labs  Lab 04/13/18 1051  INR 0.92    No results for input(s): DDIMER in the last 72 hours.  Cardiac Enzymes No results for input(s): CKMB, TROPONINI, MYOGLOBIN in the last 168 hours.  Invalid input(s): CK ------------------------------------------------------------------------------------------------------------------ Invalid input(s): POCBNP    Assessment & Plan   67 year old female with past medical history of COPD, obstructive sleep apnea, spinal stenosis, hypothyroidism, hypertension, hyperlipidemia, GERD, history of breast cancer who presented to the hospital for an elective bronchoscopy due to a enlarging left lower lobe nodule and post bronchoscopy patient developed some chest pain and shortness of breath and noted to have a small pneumothorax.  1.  Pneumothorax status post bronchoscopy-  Patient's pneumothorax worsen had chest tube placed, was seen by intensivist Further recommendations per them  2.  History of COPD- with wheezing continue nebs -Continue Dulera.  Add doxycycline for acute bronchitis  3.  Essential hypertension-continue  Toprol.  4.  GERD-continue Pepcid.  5.  Anxiety-continue Xanax as needed     Code Status Orders  (From admission, onward)         Start     Ordered   04/12/18 1536  Full code  Continuous     04/12/18 1537        Code Status History    This patient has a current code status but no historical code status.           Consults   none  DVT Prophylaxis  Lovenox   Lab Results  Component Value Date   PLT 187 04/14/2018     Time Spent in minutes  54min Greater than 50% of time spent in care coordination and counseling patient regarding the condition and plan of care.   Dustin Flock M.D on 04/15/2018 at 1:14 PM  Between 7am to 6pm - Pager - [phone]  After 6pm go to [email or web address] - Proofreader  [employer]   Office  [phone]

## 2018-04-15 NOTE — Progress Notes (Addendum)
Name: NICOSHA STRUVE MRN: [id number] DOB: [birth year]     CONSULTATION DATE: 04/12/2018 Subjective & Objectives: No major issues last night  PAST MEDICAL HISTORY :   has a past medical history of Anxiety, Breast cancer ([place]) (01/23/2014), Cancer ([place]) (1995), COPD (chronic obstructive pulmonary disease) ([place]), Dysrhythmia, GERD (gastroesophageal reflux disease), History of colon polyps, Hypercholesterolemia, Hypertension, Hypothyroidism, Insomnia, Papilloma of breast (01/09/2014), Personal history of radiation therapy, Personal history of tobacco use, presenting hazards to health (11/18/2015), Sleep apnea, Sleep apnea, SOB (shortness of breath) on exertion, Spinal stenosis, Thyroid disease, and Vitamin D deficiency.  has a past surgical history that includes Cholecystectomy; Bladder suspension (2001); Rotator cuff repair (Right, 1995); wide excision debriedment (Left, 04/10/14); Vaginal hysterectomy (1978); Colonoscopy (2014); Breast surgery (Left, 01/23/14); Breast surgery (04/10/14); Breast lumpectomy (Left, 01/23/2014); Colonoscopy with propofol (N/A, 10/10/2016); Breast cyst aspiration (Left, 1985); Breast biopsy (Right, 1975, 1985); Breast biopsy (Right, 01/03/2014); Breast biopsy (Left, 01/03/2014); Breast biopsy (Left, 01/13/2015); Vulvectomy partial (1995); and Electormagnetic navigation bronchoscopy (N/A, 04/12/2018). Prior to Admission medications   Medication Sig Start Date End Date Taking? Authorizing Provider  albuterol (PROAIR HFA) 108 (90 Base) MCG/ACT inhaler Inhale 2 puffs into the lungs every 6 (six) hours as needed for wheezing or shortness of breath. 03/21/17  Yes Mar Daring, PA-C  ALPRAZolam Duanne Moron) 0.5 MG tablet Take 1 tablet (0.5 mg total) by mouth 2 (two) times daily. As needed. 11/30/17  Yes Mar Daring, PA-C  aspirin EC 81 MG tablet Take 81 mg by mouth daily.   Yes [provider]  cholecalciferol (VITAMIN D) 1000 units tablet Take 1 tablet (1,000 Units  total) by mouth daily. 02/10/16  Yes Margarita Rana, MD  fluticasone [facility]) 50 MCG/ACT nasal spray Place 2 sprays into both nostrils 2 (two) times daily as needed for allergies or rhinitis.   Yes [provider]  Fluticasone-Salmeterol (ADVAIR) 250-50 MCG/DOSE AEPB Inhale 1 puff into the lungs 2 (two) times daily. 08/02/17  Yes Mar Daring, PA-C  ketotifen (ZADITOR) 0.025 % ophthalmic solution Place 1 drop into both eyes 2 (two) times daily as needed.   Yes [provider]  metoprolol succinate (TOPROL-XL) 25 MG 24 hr tablet Take 1 tablet (25 mg total) by mouth daily. 10/04/17  Yes Mar Daring, PA-C  naproxen sodium (ALEVE) 220 MG tablet Take 220 mg by mouth daily as needed.   Yes [provider]  ranitidine (ZANTAC) 150 MG tablet Take 150 mg by mouth 2 (two) times daily.   Yes [provider]   Allergies  Allergen Reactions  . Codeine Nausea And Vomiting  . Hydrochlorothiazide Swelling    Swelling of tongue.  [place] [Levofloxacin In D5w] Hives and Swelling    Leg swelling     FAMILY HISTORY:  family history includes COPD in her mother; Cancer in her other; Cancer (age of onset: 43) in her maternal grandfather; Cancer (age of onset: 48) in her paternal grandfather; Crohn's disease in her sister; Diabetes in her father and sister; Emphysema in her brother, brother, and sister; Heart failure in her sister; Hypertension in her sister, sister, and sister; Lung cancer in her brother. SOCIAL HISTORY:  reports that she quit smoking about 3 years ago. Her smoking use included cigarettes. She has a 50.00 pack-year smoking history. She has never used smokeless tobacco. She reports that she drinks about 14.0 standard drinks of alcohol per week. She reports that she does not use drugs.  REVIEW OF SYSTEMS:  Unable to obtain due to critical illness   VITAL SIGNS: Temp:  [97.9 F (36.6 C)-98.3 F (36.8 C)] 97.9 F (36.6 C) (09/01  0354) Pulse Rate:  [82-89] 84 (09/01 0354) Resp:  [14-20] 18 (09/01 0354) BP: (120-132)/(59-68) 132/61 (09/01 0354) SpO2:  [95 %-100 %] 95 % (09/01 0807)  Physical Examination:  Awake and oriented with no acute neurological deficits Tolerating nasal cannula, no distress, able to talk in full sentences, bilateral equal air entry with no adventitious sounds.  Small chest tube in place left mid clavicular line on Pleur-evac to water seal. S1 & S2 are audible with no murmur Benign abdominal exam with normal peristalsis No leg edema  ASSESSMENT / PLAN:  Acute respiratory failure tolerating nasal cannula -Monitor work of breathing and O2 sat  Left pneumothorax status post navigation bronchoscope on 04/12/2018.  Left chest drain in place, no air leak.  Follow-up x-ray showed complete resolution of pneumothorax -Trial clamping and follow up CXR -Pleur-evac to low wall suction when the amount of water  Questionable lung nodule with groundglass opacification on imaging study status post biopsy -Follow with biopsy report and management as pulmonary  COPD -Optimize bronchodilators  Hypertension -Optimize antihypertensives and monitor hemodynamics  Anemia -Keep HB > 7 gm/dl.  Continue with medical management as per primary group  Partial code DO NOT INTUBATE  DVT & GI prophylaxis.  Continue with supportive care Plan of care was discussed with the patient Critical care time 20 minutes  1815 Chest tube was clamped for 7 h. No pneumothorax on repeat CXR and patient is asymptomatic with equal air entry. Chest tube was d/c and the sites was dressed with Vaseline gauze. No immediate complication. Peg tail cath was inspected after removal to be intact Patient tolerated the procedure Plan: Follow up CXR in am.

## 2018-04-16 ENCOUNTER — Inpatient Hospital Stay: Payer: Medicare Other

## 2018-04-16 MED ORDER — DOXYCYCLINE HYCLATE 100 MG PO TABS: 100.0000 mg | ORAL_TABLET | Freq: Two times a day (BID) | ORAL | 0 refills | Status: DC

## 2018-04-16 MED ORDER — BACITRACIN-NEOMYCIN-POLYMYXIN 400-5-5000 EX OINT: TOPICAL_OINTMENT | Freq: Every day | CUTANEOUS | 0 refills | Status: DC

## 2018-04-16 MED ORDER — BACITRACIN-NEOMYCIN-POLYMYXIN 400-5-5000 EX OINT
TOPICAL_OINTMENT | Freq: Every day | CUTANEOUS | Status: DC
  Filled 2018-04-16: qty 14.17
  Filled 2018-04-16: qty 1

## 2018-04-16 NOTE — Progress Notes (Signed)
Discharge teaching given to patient, patient verbalized understanding and had no questions. Patient IV removed. Patient will be transported home by family. All patient belongings gathered prior to leaving.  

## 2018-04-16 NOTE — Care Management Important Message (Signed)
Important Message  Patient Details  Name: Michelle Pitts MRN: [id number] Date of Birth: 1950-08-26   Medicare Important Message Given:  Yes    Juliann Pulse A [place] Dimmitt 04/16/2018, 10:52 AM

## 2018-04-16 NOTE — Care Management (Signed)
Discharge to home today per Dr. Posey Pronto. No follow-up needs identified. Family will transport Michelle Ammons RN MSN CCM Care Management [phone]

## 2018-04-16 NOTE — Discharge Summary (Signed)
[name] [facility], 67 y.o., DOB Jan 08, 1951, MRN [id number]. Admission date: 04/12/2018 Discharge Date 04/16/2018 Primary MD Mar Daring, PA-C Admitting Physician Flora Lipps, MD  Admission Diagnosis  LUNG NODULE  Discharge Diagnosis   Active Problems: Pneumothorax post bronchoscopy COPD Acute bronchitis Buttocks folliculitis Essential hypertension GERD Anxiety       Hospital Course  67 year old female with past medical history of COPD, obstructive sleep apnea, spinal stenosis, hypothyroidism, hypertension, hyperlipidemia, GERD, history of breast cancer who presented to the hospital for an elective bronchoscopy due to a enlarging left lower lobe nodule and post bronchoscopy patient developed some chest pain and shortness of breath and noted to have a small pneumothorax.  Patient initially was admitted for observation however next day chest x-ray showed progression of the pneumothorax.  Therefore the pulmonologist contacted interventional radiologist and had a chest tube placed.  Patient's chest tube was removed yesterday.  Chest x-ray shows no further pneumothorax.  Patient did complain of some cough and productive sputum.  She is treated for bronchitis.  She also had a small buttocks folliculitis.           Consults  pulmonary/intensive care  Significant Tests:  See full reports for all details     Dg Chest Port 1 View  Result Date: 04/16/2018 CLINICAL DATA:  Status post bronchoscopy 04/12/2018. Status post left chest tube removal today. Shortness of breath. EXAM: PORTABLE CHEST 1 VIEW COMPARISON:  Single-view of the chest 04/15/2018. FINDINGS: Pigtail catheter has been removed from the left chest. No pneumothorax is identified. The lungs are emphysematous but appear clear. Heart size is normal. No pneumothorax or pleural effusion. IMPRESSION: Left chest tube has been removed. Negative for pneumothorax. No acute  disease. Electronically Signed   By: Inge Rise M.D.   On: 04/16/2018 11:02   Dg Chest Port 1 View  Result Date: 04/15/2018 CLINICAL DATA:  Evaluate chest tube EXAM: PORTABLE CHEST 1 VIEW COMPARISON:  Chest radiograph 04/15/2018 FINDINGS: Normal heart size. Bibasilar atelectasis. Left chest tube remains in position. No definite left-sided pneumothorax. IMPRESSION: Left chest tube remains in place. No definite left-sided pneumothorax. Electronically Signed   By: Lovey Newcomer M.D.   On: 04/15/2018 17:36   Dg Chest Port 1 View  Result Date: 04/15/2018 CLINICAL DATA:  Patient status post bronchoscopy 04/12/2018. Left chest tube in place. EXAM: PORTABLE CHEST 1 VIEW COMPARISON:  Single-view of the chest 04/14/2018 and 04/13/2018. CT chest 04/12/2018. FINDINGS: Pigtail catheter remains in place in the left chest. No pneumothorax. Lungs are clear. Heart size is normal. No pleural effusion. No acute bony abnormality. IMPRESSION: No acute disease. Negative for pneumothorax with a left chest tube in place. Electronically Signed   By: Inge Rise M.D.   On: 04/15/2018 09:09   Dg Chest Port 1 View  Result Date: 04/14/2018 CLINICAL DATA:  Follow-up pneumothorax EXAM: PORTABLE CHEST 1 VIEW COMPARISON:  Yesterday FINDINGS: Normal heart size and mediastinal contours. There is bronchitic airway thickening that is chronic. No visible pneumothorax. Stable left chest tube positioning. Normal heart size. IMPRESSION: No visible pneumothorax. Electronically Signed   By: Monte Fantasia M.D.   On: 04/14/2018 08:36   Dg Chest Port 1 View  Result Date: 04/13/2018 CLINICAL DATA:  Left-sided chest tube placement. EXAM: PORTABLE CHEST 1 VIEW COMPARISON:  One-view chest x-ray of the same day at 6 o'clock a.m. FINDINGS: Heart size is normal. A left-sided pigtail drainage catheter has been placed in the  interval. The left lung is re-expanded. No significant residual pneumothorax is present. Left basilar airspace disease  likely reflects atelectasis. IMPRESSION: 1. Re-expansion of the left lung following placement of a pigtail all purpose drainage catheter. Electronically Signed   By: San Morelle M.D.   On: 04/13/2018 15:38   Portable Chest 1 View  Result Date: 04/13/2018 CLINICAL DATA:  67 y/o  F; pneumothorax. EXAM: PORTABLE CHEST 1 VIEW COMPARISON:  04/12/2018 chest radiograph. FINDINGS: Stable cardiac silhouette. No rightward mediastinal shift. Mild interval progression of the left-sided pneumothorax. Increasing atelectasis in the left lung. Nodular opacities projecting over the left lower lung zone again noted. No acute osseous abnormality identified. IMPRESSION: Mild interval progression of left-sided pneumothorax. No mediastinal shift. These results will be called to the ordering clinician or representative by the Radiologist Assistant, and communication documented in the PACS or zVision Dashboard. Electronically Signed   By: Kristine Garbe M.D.   On: 04/13/2018 06:28   Dg Chest Port 1 View  Result Date: 04/12/2018 CLINICAL DATA:  Status post bronchoscopy. EXAM: PORTABLE CHEST 1 VIEW COMPARISON:  Prior CTs and radiographs FINDINGS: The cardiomediastinal silhouette is unremarkable. A small LEFT apical pneumothorax is noted, approximately 15%. Nodular opacity overlying the mid-lower LEFT lung again noted. There is no evidence of pleural effusion. No other changes identified. IMPRESSION: Small LEFT apical pneumothorax, approximately 15%. Critical Value/emergent results were called by telephone at the time of interpretation on 04/12/2018 at 2:59 pm to [facility], nurse for this patient, who verbally acknowledged these results. Electronically Signed   By: Margarette Canada M.D.   On: 04/12/2018 14:59   Dg C-arm 1-60 Min-no Report  Result Date: 04/12/2018 Fluoroscopy was utilized by the requesting physician.  No radiographic interpretation.   Ct Image Guided Fluid Drain By Catheter  Result Date:  04/13/2018 INDICATION: Enlarging symptomatic left-sided pneumothorax following endoscopic biopsy. EXAM: CT-GUIDED LEFT-SIDED CHEST TUBE PLACEMENT COMPARISON:  Chest radiograph-earlier same day; 04/12/2018 MEDICATIONS: The patient is currently admitted to the hospital and receiving intravenous antibiotics. The antibiotics were administered within an appropriate time frame prior to the initiation of the procedure. ANESTHESIA/SEDATION: Moderate (conscious) sedation was employed during this procedure. A total of Versed 150 mg and Fentanyl 3 mcg was administered intravenously. Moderate Sedation Time: 24 minutes. The patient's level of consciousness and vital signs were monitored continuously by radiology nursing throughout the procedure under my direct supervision. CONTRAST:  None COMPLICATIONS: None immediate. PROCEDURE: Informed written consent was obtained from the patient after a discussion of the risks, benefits and alternatives to treatment. The patient was placed supine on the CT gantry and a pre procedural CT was performed re-demonstrating the large left-sided pneumothorax. The procedure was planned. A timeout was performed prior to the initiation of the procedure. The skin overlying the anterior aspect the left upper chest was prepped and draped in the usual sterile fashion. The overlying soft tissues were anesthetized with 1% lidocaine with epinephrine. Appropriate trajectory was planned with the use of a 22 gauge spinal needle. An 18 gauge trocar needle was advanced into the left pleural space and a short Amplatz super stiff wire was coiled within the apex of the left pleural space. Appropriate positioning was confirmed with a limited CT scan. The tract was serially dilated allowing placement of a 10 [place] all-purpose drainage catheter. Appropriate positioning was confirmed with a limited postprocedural CT scan. The drainage catheter was connected to a pleura vac and sutured in place. Postprocedural imaging  demonstrated near complete evacuation of the left-sided pneumothorax.  A dressing was placed. The patient tolerated the procedure well without immediate post procedural complication. IMPRESSION: Successful CT guided placement of a 10 French all purpose drain catheter into the apical aspect of the left pleural space for enlarging symptomatic left-sided pneumothorax. Electronically Signed   By: Sandi Mariscal M.D.   On: 04/13/2018 15:02   Ct Super D Chest Wo Contrast  Result Date: 04/12/2018 CLINICAL DATA:  Super D chest for biopsy planning. EXAM: CT CHEST WITHOUT CONTRAST TECHNIQUE: Multidetector CT imaging of the chest was performed using thin slice collimation for electromagnetic bronchoscopy planning purposes, without intravenous contrast. COMPARISON:  PET-CT 03/06/2018 and chest CT 12/06/2017. FINDINGS: Cardiovascular: The heart is normal in size. No pericardial effusion. Stable mild tortuosity and calcification of the thoracic aorta. Stable scattered coronary artery calcifications. Mediastinum/Nodes: No mediastinal or hilar mass or lymphadenopathy. The esophagus is grossly normal. Lungs/Pleura: Stable 18 mm ground-glass nodule in the left lower lobe on image number 29. No other pulmonary lesions are identified. No acute pulmonary findings. Scattered bullous changes. Upper Abdomen: No significant upper abdominal findings. Status post cholecystectomy without biliary dilatation. No hepatic or adrenal gland lesions are identified. Musculoskeletal: No worrisome breast lesions. Evidence of prior left breast surgery and possible radiation. No supraclavicular or axillary adenopathy. No significant bony findings. IMPRESSION: 1. Stable 18 mm ground-glass nodule in the left lower lobe. 2. No enlarged mediastinal or hilar lymph nodes. 3. Evidence of previous left breast surgery and possible radiation but no recurrent breast mass or left axillary adenopathy. Aortic Atherosclerosis (ICD10-I70.0) and Emphysema (ICD10-J43.9).  Electronically Signed   By: Marijo Sanes M.D.   On: 04/12/2018 15:15       Today   Subjective:   Michelle Pitts patient is feeling better denies any complaints besides cough  Objective:   Blood pressure (!) 169/83, pulse (!) 102, temperature 97.9 F (36.6 C), temperature source Oral, resp. rate 14, height 5' 4.5" (1.638 m), weight 112 kg, SpO2 95 %.  .  Intake/Output Summary (Last 24 hours) at 04/16/2018 1324 Last data filed at 04/16/2018 1002 Gross per 24 hour  Intake 840 ml  Output 400 ml  Net 440 ml    Exam VITAL SIGNS: Blood pressure (!) 169/83, pulse (!) 102, temperature 97.9 F (36.6 C), temperature source Oral, resp. rate 14, height 5' 4.5" (1.638 m), weight 112 kg, SpO2 95 %.  GENERAL:  67 y.o.-year-old patient lying in the bed with no acute distress.  EYES: Pupils equal, round, reactive to light and accommodation. No scleral icterus. Extraocular muscles intact.  HEENT: Head atraumatic, normocephalic. Oropharynx and nasopharynx clear.  NECK:  Supple, no jugular venous distention. No thyroid enlargement, no tenderness.  LUNGS: Normal breath sounds bilaterally, no wheezing, rales,rhonchi or crepitation. No use of accessory muscles of respiration.  CARDIOVASCULAR: S1, S2 normal. No murmurs, rubs, or gallops.  ABDOMEN: Soft, nontender, nondistended. Bowel sounds present. No organomegaly or mass.  EXTREMITIES: No pedal edema, cyanosis, or clubbing.  NEUROLOGIC: Cranial nerves II through XII are intact. Muscle strength 5/5 in all extremities. Sensation intact. Gait not checked.  PSYCHIATRIC: The patient is alert and oriented x 3.  SKIN: No obvious rash, lesion, or ulcer.   Data Review     CBC w Diff:  Lab Results  Component Value Date   WBC 5.8 04/14/2018   HGB 10.9 (L) 04/14/2018   HGB 12.8 02/14/2018   HCT 32.2 (L) 04/14/2018   HCT 39.4 02/14/2018   PLT 187 04/14/2018   PLT 228 02/14/2018  LYMPHOPCT 32 10/27/2015   LYMPHOPCT 19.2 07/14/2014   MONOPCT 8  10/27/2015   MONOPCT 6.5 07/14/2014   EOSPCT 2 10/27/2015   EOSPCT 0.6 07/14/2014   BASOPCT 1 10/27/2015   BASOPCT 0.6 07/14/2014   CMP:  Lab Results  Component Value Date   NA 140 04/14/2018   NA 141 02/14/2018   NA 138 07/14/2014   K 4.2 04/14/2018   K 4.8 07/14/2014   CL 105 04/14/2018   CL 102 07/14/2014   CO2 27 04/14/2018   CO2 29 07/14/2014   BUN 17 04/14/2018   BUN 19 02/14/2018   BUN 11 07/14/2014   CREATININE 0.71 04/14/2018   CREATININE 0.96 07/14/2014   PROT 6.9 02/14/2018   PROT 7.4 07/14/2014   ALBUMIN 4.4 02/14/2018   ALBUMIN 3.9 07/14/2014   BILITOT 0.2 02/14/2018   BILITOT 0.4 07/14/2014   ALKPHOS 134 (H) 02/14/2018   ALKPHOS 165 (H) 07/14/2014   AST 28 02/14/2018   AST 40 (H) 07/14/2014   ALT 41 (H) 02/14/2018   ALT 57 07/14/2014  .  Micro Results No results found for this or any previous visit (from the past 240 hour(s)).      Code Status Orders  (From admission, onward)         Start     Ordered   04/14/18 1224  Limited resuscitation (code)  Continuous    Question Answer Comment  In the event of cardiac or respiratory ARREST: Initiate Code Blue, Call Rapid Response Yes   In the event of cardiac or respiratory ARREST: Perform CPR Yes   In the event of cardiac or respiratory ARREST: Perform Intubation/Mechanical Ventilation No   In the event of cardiac or respiratory ARREST: Use NIPPV/BiPAp only if indicated Yes   In the event of cardiac or respiratory ARREST: Administer ACLS medications if indicated Yes   In the event of cardiac or respiratory ARREST: Perform Defibrillation or Cardioversion if indicated Yes      04/14/18 1223        Code Status History    Date Active Date Inactive Code Status Order ID Comments User Context   04/12/2018 1537 04/14/2018 1223 Full Code [id number]  Henreitta Leber, MD Inpatient          Follow-up Information    Mar Daring, PA-C Follow up in 6 day(s).   Specialty:  Family  Medicine Contact information: [place] [address] [phone]        Flora Lipps, MD Follow up in 4 day(s).   Specialties:  Pulmonary Disease, Cardiology Why:  hosp f/u pneumothorax Contact information: [place] [address] [phone]           Discharge Medications   Allergies as of 04/16/2018      Reactions   Codeine Nausea And Vomiting   Hydrochlorothiazide Swelling   Swelling of tongue.   Levaquin [levofloxacin In D5w] Hives, Swelling   Leg swelling      Medication List    TAKE these medications   albuterol 108 (90 Base) MCG/ACT inhaler Commonly known as:  PROVENTIL HFA;VENTOLIN HFA Inhale 2 puffs into the lungs every 6 (six) hours as needed for wheezing or shortness of breath.   ALPRAZolam 0.5 MG tablet Commonly known as:  XANAX Take 1 tablet (0.5 mg total) by mouth 2 (two) times daily. As needed.   aspirin EC 81 MG tablet Take 81 mg by mouth daily.   cholecalciferol  1000 units tablet Commonly known as:  VITAMIN D Take 1 tablet (1,000 Units total) by mouth daily.   doxycycline 100 MG tablet Commonly known as:  VIBRA-TABS Take 1 tablet (100 mg total) by mouth every 12 (twelve) hours for 5 days.   fluticasone 50 MCG/ACT nasal spray Commonly known as:  FLONASE Place 2 sprays into both nostrils 2 (two) times daily as needed for allergies or rhinitis.   Fluticasone-Salmeterol 250-50 MCG/DOSE Aepb Commonly known as:  ADVAIR Inhale 1 puff into the lungs 2 (two) times daily.   ketotifen 0.025 % ophthalmic solution Commonly known as:  ZADITOR Place 1 drop into both eyes 2 (two) times daily as needed.   metoprolol succinate 25 MG 24 hr tablet Commonly known as:  TOPROL-XL Take 1 tablet (25 mg total) by mouth daily.   naproxen sodium 220 MG tablet Commonly known as:  ALEVE Take 220 mg by mouth daily as needed.   neomycin-bacitracin-polymyxin ointment Commonly known as:  NEOSPORIN Apply  topically daily.   ranitidine 150 MG tablet Commonly known as:  ZANTAC Take 150 mg by mouth 2 (two) times daily.          Total Time in preparing paper work, data evaluation and todays exam - 71 minutes  Dustin Flock M.D on 04/16/2018 at [place]  [phone]

## 2018-04-17 LAB — CYTOLOGY - NON PAP

## 2018-04-17 LAB — SURGICAL PATHOLOGY

## 2018-04-18 ENCOUNTER — Emergency Department: Payer: Medicare Other

## 2018-04-18 ENCOUNTER — Inpatient Hospital Stay
Admission: EM | Admit: 2018-04-18 | Discharge: 2018-04-23 | DRG: 200 | Disposition: A | Discharge status: Home / Self Care | Payer: Medicare Other | Attending: Internal Medicine | Admitting: Internal Medicine

## 2018-04-18 ENCOUNTER — Ambulatory Visit
Admission: RE | Admit: 2018-04-18 | Discharge: 2018-04-18 | Disposition: A | Discharge status: Home / Self Care | Payer: Medicare Other | Source: Ambulatory Visit | Attending: Internal Medicine | Admitting: Internal Medicine

## 2018-04-18 ENCOUNTER — Other Ambulatory Visit: Payer: Self-pay

## 2018-04-18 ENCOUNTER — Encounter: Payer: Self-pay | Admitting: Internal Medicine

## 2018-04-18 ENCOUNTER — Ambulatory Visit: Admission: RE | Admit: 2018-04-18 | Payer: Medicare Other | Source: Ambulatory Visit | Admitting: Radiation Oncology

## 2018-04-18 ENCOUNTER — Telehealth: Payer: Self-pay

## 2018-04-18 ENCOUNTER — Inpatient Hospital Stay: Payer: Medicare Other | Attending: Internal Medicine | Admitting: Internal Medicine

## 2018-04-18 VITALS — BP 149/87 | HR 89 | Temp 97.6°F | Resp 20 | Ht 64.5 in | Wt 246.0 lb

## 2018-04-18 DIAGNOSIS — E559 Vitamin D deficiency, unspecified: Secondary | ICD-10-CM | POA: Diagnosis present

## 2018-04-18 DIAGNOSIS — Z853 Personal history of malignant neoplasm of breast: Secondary | ICD-10-CM

## 2018-04-18 DIAGNOSIS — Z86010 Personal history of colonic polyps: Secondary | ICD-10-CM | POA: Diagnosis not present

## 2018-04-18 DIAGNOSIS — Z9049 Acquired absence of other specified parts of digestive tract: Secondary | ICD-10-CM

## 2018-04-18 DIAGNOSIS — Z90711 Acquired absence of uterus with remaining cervical stump: Secondary | ICD-10-CM | POA: Diagnosis not present

## 2018-04-18 DIAGNOSIS — Z87891 Personal history of nicotine dependence: Secondary | ICD-10-CM

## 2018-04-18 DIAGNOSIS — Z8589 Personal history of malignant neoplasm of other organs and systems: Secondary | ICD-10-CM

## 2018-04-18 DIAGNOSIS — E7800 Pure hypercholesterolemia, unspecified: Secondary | ICD-10-CM | POA: Diagnosis present

## 2018-04-18 DIAGNOSIS — G4700 Insomnia, unspecified: Secondary | ICD-10-CM | POA: Diagnosis present

## 2018-04-18 DIAGNOSIS — Z825 Family history of asthma and other chronic lower respiratory diseases: Secondary | ICD-10-CM

## 2018-04-18 DIAGNOSIS — K219 Gastro-esophageal reflux disease without esophagitis: Secondary | ICD-10-CM | POA: Diagnosis present

## 2018-04-18 DIAGNOSIS — C3431 Malignant neoplasm of lower lobe, right bronchus or lung: Secondary | ICD-10-CM

## 2018-04-18 DIAGNOSIS — F411 Generalized anxiety disorder: Secondary | ICD-10-CM | POA: Diagnosis present

## 2018-04-18 DIAGNOSIS — J449 Chronic obstructive pulmonary disease, unspecified: Secondary | ICD-10-CM | POA: Diagnosis present

## 2018-04-18 DIAGNOSIS — Z9689 Presence of other specified functional implants: Secondary | ICD-10-CM

## 2018-04-18 DIAGNOSIS — J939 Pneumothorax, unspecified: Secondary | ICD-10-CM | POA: Insufficient documentation

## 2018-04-18 DIAGNOSIS — E039 Hypothyroidism, unspecified: Secondary | ICD-10-CM | POA: Diagnosis present

## 2018-04-18 DIAGNOSIS — R911 Solitary pulmonary nodule: Secondary | ICD-10-CM

## 2018-04-18 DIAGNOSIS — Z79899 Other long term (current) drug therapy: Secondary | ICD-10-CM

## 2018-04-18 DIAGNOSIS — Z791 Long term (current) use of non-steroidal anti-inflammatories (NSAID): Secondary | ICD-10-CM | POA: Diagnosis not present

## 2018-04-18 DIAGNOSIS — I10 Essential (primary) hypertension: Secondary | ICD-10-CM | POA: Diagnosis present

## 2018-04-18 DIAGNOSIS — Z6841 Body Mass Index (BMI) 40.0 and over, adult: Secondary | ICD-10-CM | POA: Diagnosis not present

## 2018-04-18 DIAGNOSIS — Z801 Family history of malignant neoplasm of trachea, bronchus and lung: Secondary | ICD-10-CM | POA: Diagnosis not present

## 2018-04-18 DIAGNOSIS — Z833 Family history of diabetes mellitus: Secondary | ICD-10-CM | POA: Diagnosis not present

## 2018-04-18 DIAGNOSIS — Z8249 Family history of ischemic heart disease and other diseases of the circulatory system: Secondary | ICD-10-CM | POA: Diagnosis not present

## 2018-04-18 DIAGNOSIS — Z923 Personal history of irradiation: Secondary | ICD-10-CM

## 2018-04-18 DIAGNOSIS — Z09 Encounter for follow-up examination after completed treatment for conditions other than malignant neoplasm: Secondary | ICD-10-CM

## 2018-04-18 DIAGNOSIS — Z79891 Long term (current) use of opiate analgesic: Secondary | ICD-10-CM | POA: Diagnosis not present

## 2018-04-18 DIAGNOSIS — Z888 Allergy status to other drugs, medicaments and biological substances status: Secondary | ICD-10-CM | POA: Diagnosis not present

## 2018-04-18 DIAGNOSIS — Z885 Allergy status to narcotic agent status: Secondary | ICD-10-CM

## 2018-04-18 DIAGNOSIS — Z7982 Long term (current) use of aspirin: Secondary | ICD-10-CM

## 2018-04-18 DIAGNOSIS — Z7951 Long term (current) use of inhaled steroids: Secondary | ICD-10-CM

## 2018-04-18 DIAGNOSIS — E785 Hyperlipidemia, unspecified: Secondary | ICD-10-CM | POA: Diagnosis present

## 2018-04-18 DIAGNOSIS — G4733 Obstructive sleep apnea (adult) (pediatric): Secondary | ICD-10-CM | POA: Diagnosis present

## 2018-04-18 DIAGNOSIS — J95811 Postprocedural pneumothorax: Secondary | ICD-10-CM | POA: Diagnosis present

## 2018-04-18 LAB — BASIC METABOLIC PANEL
ANION GAP: 10 (ref 5–15)
BUN: 17 mg/dL (ref 8–23)
CHLORIDE: 104 mmol/L (ref 98–111)
CO2: 27 mmol/L (ref 22–32)
CREATININE: 0.76 mg/dL (ref 0.44–1.00)
Calcium: 9.9 mg/dL (ref 8.9–10.3)
GFR calc non Af Amer: >60 mL/min (ref >60)
Glucose, Bld: 99 mg/dL (ref 70–99)
Potassium: 4.5 mmol/L (ref 3.5–5.1)
Sodium: 141 mmol/L (ref 135–145)

## 2018-04-18 LAB — CBC WITH DIFFERENTIAL/PLATELET
Basophils Absolute: 0.1 10*3/uL (ref 0–0.1)
Basophils Relative: 1 %
Eosinophils Absolute: 0.1 10*3/uL (ref 0–0.7)
Eosinophils Relative: 1 %
HEMATOCRIT: 40.8 % (ref 35.0–47.0)
HEMOGLOBIN: 13.9 g/dL (ref 12.0–16.0)
LYMPHS ABS: 1.4 10*3/uL (ref 1.0–3.6)
LYMPHS PCT: 20 %
MCH: 31.7 pg (ref 26.0–34.0)
MCHC: 34.2 g/dL (ref 32.0–36.0)
MCV: 92.9 fL (ref 80.0–100.0)
MONOS PCT: 9 %
Monocytes Absolute: 0.6 10*3/uL (ref 0.2–0.9)
NEUTROS ABS: 4.8 10*3/uL (ref 1.4–6.5)
NEUTROS PCT: 69 %
Platelets: 248 10*3/uL (ref 150–440)
RBC: 4.39 MIL/uL (ref 3.80–5.20)
RDW: 13.2 % (ref 11.5–14.5)
WBC: 6.9 10*3/uL (ref 3.6–11.0)

## 2018-04-18 LAB — PROTIME-INR
INR: 0.96
Prothrombin Time: 12.7 seconds (ref 11.4–15.2)

## 2018-04-18 MED ORDER — ALBUTEROL SULFATE (2.5 MG/3ML) 0.083% IN NEBU: 2.5000 mg | INHALATION_SOLUTION | Freq: Four times a day (QID) | RESPIRATORY_TRACT | Status: DC | PRN

## 2018-04-18 MED ORDER — ENOXAPARIN SODIUM 40 MG/0.4ML ~~LOC~~ SOLN
40.0000 mg | Freq: Two times a day (BID) | SUBCUTANEOUS | Status: DC
  Administered 2018-04-18 – 2018-04-22 (×9): 40 mg via SUBCUTANEOUS
  Filled 2018-04-18 (×10): qty 0.4

## 2018-04-18 MED ORDER — ONDANSETRON HCL 4 MG PO TABS: 4.0000 mg | ORAL_TABLET | Freq: Four times a day (QID) | ORAL | Status: DC | PRN

## 2018-04-18 MED ORDER — FENTANYL CITRATE (PF) 100 MCG/2ML IJ SOLN
INTRAMUSCULAR | Status: AC | PRN
  Administered 2018-04-18: 50 ug via INTRAVENOUS
  Administered 2018-04-18 (×3): 25 ug via INTRAVENOUS

## 2018-04-18 MED ORDER — ACETAMINOPHEN 325 MG PO TABS
650.0000 mg | ORAL_TABLET | Freq: Four times a day (QID) | ORAL | Status: DC | PRN
  Administered 2018-04-18 – 2018-04-22 (×5): 650 mg via ORAL
  Filled 2018-04-18 (×4): qty 2

## 2018-04-18 MED ORDER — ONDANSETRON HCL 4 MG/2ML IJ SOLN: 4.0000 mg | Freq: Four times a day (QID) | INTRAMUSCULAR | Status: DC | PRN

## 2018-04-18 MED ORDER — FENTANYL CITRATE (PF) 100 MCG/2ML IJ SOLN
INTRAMUSCULAR | Status: AC
  Filled 2018-04-18: qty 4

## 2018-04-18 MED ORDER — ACETAMINOPHEN 650 MG RE SUPP: 650.0000 mg | Freq: Four times a day (QID) | RECTAL | Status: DC | PRN

## 2018-04-18 MED ORDER — MIDAZOLAM HCL 5 MG/5ML IJ SOLN
INTRAMUSCULAR | Status: AC
  Filled 2018-04-18: qty 5

## 2018-04-18 MED ORDER — LIDOCAINE HCL (PF) 1 % IJ SOLN
INTRAMUSCULAR | Status: AC | PRN
  Administered 2018-04-18: 10 mL

## 2018-04-18 MED ORDER — SODIUM CHLORIDE 0.9 % IV SOLN
INTRAVENOUS | Status: AC | PRN
  Administered 2018-04-18: 10 mL/h via INTRAVENOUS

## 2018-04-18 MED ORDER — ALPRAZOLAM 0.5 MG PO TABS
0.5000 mg | ORAL_TABLET | Freq: Two times a day (BID) | ORAL | Status: DC
  Administered 2018-04-18 – 2018-04-23 (×10): 0.5 mg via ORAL
  Filled 2018-04-18 (×10): qty 1

## 2018-04-18 MED ORDER — MIDAZOLAM HCL 5 MG/5ML IJ SOLN
INTRAMUSCULAR | Status: AC | PRN
  Administered 2018-04-18: 0.5 mg via INTRAVENOUS
  Administered 2018-04-18 (×2): 1 mg via INTRAVENOUS
  Administered 2018-04-18 (×2): 0.5 mg via INTRAVENOUS

## 2018-04-18 MED ORDER — ASPIRIN EC 81 MG PO TBEC
81.0000 mg | DELAYED_RELEASE_TABLET | Freq: Every day | ORAL | Status: DC
  Administered 2018-04-19 – 2018-04-23 (×5): 81 mg via ORAL
  Filled 2018-04-18 (×5): qty 1

## 2018-04-18 MED ORDER — FAMOTIDINE 20 MG PO TABS
20.0000 mg | ORAL_TABLET | Freq: Two times a day (BID) | ORAL | Status: DC
  Administered 2018-04-18 – 2018-04-23 (×10): 20 mg via ORAL
  Filled 2018-04-18 (×11): qty 1

## 2018-04-18 MED ORDER — FLUTICASONE PROPIONATE 50 MCG/ACT NA SUSP
2.0000 | Freq: Two times a day (BID) | NASAL | Status: DC | PRN
  Filled 2018-04-18: qty 16

## 2018-04-18 MED ORDER — METOPROLOL SUCCINATE ER 25 MG PO TB24
25.0000 mg | ORAL_TABLET | Freq: Every day | ORAL | Status: DC
  Administered 2018-04-19 – 2018-04-23 (×5): 25 mg via ORAL
  Filled 2018-04-18 (×5): qty 1

## 2018-04-18 MED ORDER — MOMETASONE FURO-FORMOTEROL FUM 200-5 MCG/ACT IN AERO
2.0000 | INHALATION_SPRAY | Freq: Two times a day (BID) | RESPIRATORY_TRACT | Status: DC
  Administered 2018-04-19 – 2018-04-23 (×9): 2 via RESPIRATORY_TRACT
  Filled 2018-04-18: qty 8.8

## 2018-04-18 MED ORDER — VITAMIN D 1000 UNITS PO TABS
1000.0000 [IU] | ORAL_TABLET | Freq: Every day | ORAL | Status: DC
  Administered 2018-04-19 – 2018-04-23 (×5): 1000 [IU] via ORAL
  Filled 2018-04-18 (×7): qty 1

## 2018-04-18 NOTE — H&P (Signed)
[facility] NAME: Michelle Pitts    MR#:  [id number]  DATE OF BIRTH:  Mar 27, 1951  DATE OF ADMISSION:  04/18/2018  PRIMARY CARE PHYSICIAN: Mar Daring, PA-C   REQUESTING/REFERRING PHYSICIAN: Dr. Charlotte Crumb  CHIEF COMPLAINT:   Chief Complaint  Patient presents with  . Shortness of Breath    HISTORY OF PRESENT ILLNESS:  Michelle Pitts  is a 67 y.o. female with a known history of hypertension, hypothyroidism, hyperlipidemia, COPD, history of breast cancer who was recently discharge from the hospital after a pneumothorax post bronchoscopy and biopsy now returns back to the hospital due to shortness of breath and chest tightness and noted to have another pneumothorax.  Patient was recently admitted to the hospital post bronchoscopy and had a pneumothorax which had gotten worse and had a chest tube placed on her previous hospitalization.  She had her chest tube removed this past Sunday and then discharged on Monday and has been doing well until late yesterday evening into this morning she developed worsening shortness of breath and chest tightness.  She had a appointment with her oncologist today and when she went to see them they did a chest x-ray which showed a moderate size recurrent pneumothorax.  She was sent to the ER for further evaluation.  From the emergency room patient was taken urgently to interventional radiology and is status post IR guided chest tube placement.  Presently she is still complaining of pain near the chest tube site and some mild shortness of breath but no other associated symptoms.  Hospitalist services were contacted for admission.  PAST MEDICAL HISTORY:   Past Medical History:  Diagnosis Date  . Anxiety   . Breast cancer ([place]) 01/23/2014   Left breast, T1c, N0, M0. ER/PR +; Her 2 neu not overexpressed. Oncotype DX: 13,Low risk.9% over 10 years.   . Cancer ([place]) 1995   vulvar, [facility]  . COPD  (chronic obstructive pulmonary disease) ([facility])    bronchitis  . Dysrhythmia   . GERD (gastroesophageal reflux disease)   . History of colon polyps   . Hypercholesterolemia   . Hypertension   . Hypothyroidism   . Insomnia   . 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 Personal history of radiation therapy   . Personal history of tobacco use, presenting hazards to health 11/18/2015  . Sleep apnea   . Sleep apnea   . SOB (shortness of breath) on exertion   . Spinal stenosis   . Thyroid disease   . Vitamin D deficiency     PAST SURGICAL HISTORY:   Past Surgical History:  Procedure Laterality Date  . BLADDER SUSPENSION  2001  . BREAST BIOPSY Right 1975, 1985   benign cyst [name]) and fibroadenoma([facility])  . BREAST BIOPSY Right 01/03/2014   Papilloma, no atypia detected on mammography.  Marland Kitchen BREAST BIOPSY Left 01/03/2014   Invasive mammary carcinoma.  Marland Kitchen BREAST BIOPSY Left 01/13/2015   ultrasound giuded biopsy, negative  . BREAST CYST ASPIRATION Left 1985  . BREAST LUMPECTOMY Left 01/23/2014   positive  . BREAST SURGERY Left 01/23/14   wide excision   . BREAST SURGERY  04/10/14   Debridement of fat necrosis  . CHOLECYSTECTOMY    . COLONOSCOPY  2014   Dr Tiffany Kocher  . COLONOSCOPY WITH PROPOFOL N/A 10/10/2016   Procedure: COLONOSCOPY WITH PROPOFOL;  Surgeon: Manya Silvas, MD;  Location: [facility] ENDOSCOPY;  Service: Endoscopy;  Laterality: N/A;  . ELECTROMAGNETIC  NAVIGATION BROCHOSCOPY N/A 04/12/2018   Procedure: ELECTROMAGNETIC NAVIGATION BRONCHOSCOPY;  Surgeon: Flora Lipps, MD;  Location: [facility] ORS;  Service: Cardiopulmonary;  Laterality: N/A;  . ROTATOR CUFF REPAIR Right 1995  . VAGINAL HYSTERECTOMY  1978   partial  . VULVECTOMY PARTIAL  1995  . wide excision debriedment Left 04/10/14    SOCIAL HISTORY:   Social History   Tobacco Use  . Smoking status: Former Smoker    Packs/day: 1.00    Years: 50.00    Pack years: 50.00    Types: Cigarettes    Last attempt to quit: 09/15/2014     Years since quitting: 3.5  . Smokeless tobacco: Never Used  Substance Use Topics  . Alcohol use: Yes    Alcohol/week: 14.0 standard drinks    Types: 14 Cans of beer per week    FAMILY HISTORY:   Family History  Problem Relation Age of Onset  . Diabetes Father   . COPD Mother   . Heart failure Sister   . Emphysema Sister   . Diabetes Sister   . Hypertension Sister   . Emphysema Brother   . Lung cancer Brother   . Crohn's disease Sister   . Hypertension Sister   . Hypertension Sister   . Emphysema Brother   . Cancer Other        great great paternal aunt, ? age  . Cancer Maternal Grandfather 47       ? source  . Cancer Paternal Grandfather 14       ? source  . Breast cancer Neg Hx     DRUG ALLERGIES:   Allergies  Allergen Reactions  . Codeine Nausea And Vomiting  . Hydrochlorothiazide Swelling    Swelling of tongue.  [place] [Levofloxacin In D5w] Hives and Swelling    Leg swelling     REVIEW OF SYSTEMS:   Review of Systems  Constitutional: Negative for fever and weight loss.  HENT: Negative for congestion, nosebleeds and tinnitus.   Eyes: Negative for blurred vision, double vision and redness.  Respiratory: Positive for shortness of breath. Negative for cough and hemoptysis.   Cardiovascular: Positive for chest pain (Near CT tube site). Negative for orthopnea, leg swelling and PND.  Gastrointestinal: Negative for abdominal pain, diarrhea, melena, nausea and vomiting.  Genitourinary: Negative for dysuria, hematuria and urgency.  Musculoskeletal: Negative for falls and joint pain.  Neurological: Negative for dizziness, tingling, sensory change, focal weakness, seizures, weakness and headaches.  Endo/Heme/Allergies: Negative for polydipsia. Does not bruise/bleed easily.  Psychiatric/Behavioral: Negative for depression and memory loss. The patient is not nervous/anxious.     MEDICATIONS AT HOME:   Prior to Admission medications   Medication Sig Start  Date End Date Taking? Authorizing Provider  albuterol (PROAIR HFA) 108 (90 Base) MCG/ACT inhaler Inhale 2 puffs into the lungs every 6 (six) hours as needed for wheezing or shortness of breath. 03/21/17  Yes Mar Daring, PA-C  ALPRAZolam Duanne Moron) 0.5 MG tablet Take 1 tablet (0.5 mg total) by mouth 2 (two) times daily. As needed. 11/30/17  Yes Mar Daring, PA-C  aspirin EC 81 MG tablet Take 81 mg by mouth daily.   Yes [provider]  cholecalciferol (VITAMIN D) 1000 units tablet Take 1 tablet (1,000 Units total) by mouth daily. 02/10/16  Yes Margarita Rana, MD  doxycycline (VIBRA-TABS) 100 MG tablet Take 1 tablet (100 mg total) by mouth every 12 (twelve) hours for 5 days. 04/16/18 04/21/18 Yes Dustin Flock, MD  fluticasone (  FLONASE) 50 MCG/ACT nasal spray Place 2 sprays into both nostrils 2 (two) times daily as needed for allergies or rhinitis.   Yes [provider]  Fluticasone-Salmeterol (ADVAIR) 250-50 MCG/DOSE AEPB Inhale 1 puff into the lungs 2 (two) times daily. Patient taking differently: Inhale 1 puff into the lungs 2 (two) times daily.  08/02/17  Yes Mar Daring, PA-C  ketotifen (ZADITOR) 0.025 % ophthalmic solution Place 1 drop into both eyes 2 (two) times daily as needed.   Yes [provider]  metoprolol succinate (TOPROL-XL) 25 MG 24 hr tablet Take 1 tablet (25 mg total) by mouth daily. 10/04/17  Yes Mar Daring, PA-C  naproxen sodium (ALEVE) 220 MG tablet Take 220 mg by mouth daily as needed.   Yes [provider]  neomycin-bacitracin-polymyxin (NEOSPORIN) ointment Apply topically daily. 04/16/18  Yes Dustin Flock, MD  ranitidine (ZANTAC) 150 MG tablet Take 150 mg by mouth at bedtime.    Yes [provider]      VITAL SIGNS:  Blood pressure (!) 175/103, pulse 89, temperature 98.5 F (36.9 C), temperature source Oral, resp. rate 16, height 5\' 5"  (1.651 m), weight 111 kg, SpO2 98 %.  PHYSICAL EXAMINATION:   Physical Exam  GENERAL:  67 y.o.-year-old patient lying in the bed in no acute distress.  EYES: Pupils equal, round, reactive to light and accommodation. No scleral icterus. Extraocular muscles intact.  HEENT: Head atraumatic, normocephalic. Oropharynx and nasopharynx clear. No oropharyngeal erythema, moist oral mucosa  NECK:  Supple, no jugular venous distention. No thyroid enlargement, no tenderness.  LUNGS: Normal breath sounds bilaterally, no wheezing, rales, rhonchi. No use of accessory muscles of respiration.  Left-sided pig tail cath with chest tube in place. CARDIOVASCULAR: S1, S2 RRR. No murmurs, rubs, gallops, clicks.  ABDOMEN: Soft, nontender, nondistended. Bowel sounds present. No organomegaly or mass.  EXTREMITIES: No pedal edema, cyanosis, or clubbing. + 2 pedal & radial pulses b/l.   NEUROLOGIC: Cranial nerves II through XII are intact. No focal Motor or sensory deficits appreciated b/l PSYCHIATRIC: The patient is alert and oriented x 3. SKIN: No obvious rash, lesion, or ulcer.   LABORATORY PANEL:   CBC Recent Labs  Lab 04/18/18 1531  WBC 6.9  HGB 13.9  HCT 40.8  PLT 248   ------------------------------------------------------------------------------------------------------------------  Chemistries  Recent Labs  Lab 04/18/18 1531  NA 141  K 4.5  CL 104  CO2 27  GLUCOSE 99  BUN 17  CREATININE 0.76  CALCIUM 9.9   ------------------------------------------------------------------------------------------------------------------  Cardiac Enzymes No results for input(s): TROPONINI in the last 168 hours. ------------------------------------------------------------------------------------------------------------------  RADIOLOGY:  Dg Chest 2 View  Addendum Date: 04/18/2018   ADDENDUM REPORT: 04/18/2018 14:14 ADDENDUM: Critical Value/emergent results were called by telephone at the time of interpretation on 04/18/2018 at 1410 hours to Dr. Charlaine Dalton , who  verbally acknowledged these results. Electronically Signed   By: Genevie Ann M.D.   On: 04/18/2018 14:14   Result Date: 04/18/2018 CLINICAL DATA:  67 year old female with left side pneumothorax following endoscopic biopsy treated with chest tube placement. Chest tube removed on 04/16/2018, but worsening shortness of breath today. EXAM: CHEST - 2 VIEW COMPARISON:  04/16/2018 and earlier. FINDINGS: Recurrent moderate size left pneumothorax with pleural edge is visible in the left upper lung and laterally. Mildly lower underlying lung volumes. Mediastinal contours remain normal. The right lung remains clear. Visualized tracheal air column is within normal limits. No pleural effusion. No acute osseous abnormality identified. Negative visible bowel gas pattern.  IMPRESSION: Recurrent moderate size left pneumothorax. Electronically Signed: By: Genevie Ann M.D. On: 04/18/2018 14:04   Ct Image Guided Drainage By Percutaneous Catheter  Result Date: 04/18/2018 INDICATION: 67 year old with a recurrent symptomatic left pneumothorax. The initial pneumothorax developed following a bronchoscopic lung biopsy. EXAM: CT-GUIDED PLACEMENT OF LEFT CHEST TUBE MEDICATIONS: No antibiotics. ANESTHESIA/SEDATION: 3.5 mg IV Versed 125 mcg IV Fentanyl Moderate Sedation Time:  25 minutes The patient was continuously monitored during the procedure by the interventional radiology nurse under my direct supervision. COMPLICATIONS: None immediate. TECHNIQUE: Informed written consent was obtained from the patient after a thorough discussion of the procedural risks, benefits and alternatives. All questions were addressed. Maximal Sterile Barrier Technique was utilized including caps, mask, sterile gowns, sterile gloves, sterile drape, hand hygiene and skin antiseptic. A timeout was performed prior to the initiation of the procedure. PROCEDURE: Patient was placed supine on CT scanner with the left arm elevated. CT images through the chest were obtained. The  left lateral chest was targeted for tube placement. The left mid axillary region was prepped and draped in sterile fashion. Skin and soft tissues were anesthetized with 1% lidocaine. Strang catheter was directed in the pleural space while aspirating. A stiff Amplatz wire was placed. CT images confirmed wire placement. The tract was dilated to accommodate a 14 [place] multipurpose drain. The drain was easily advanced over the wire. The drain was attached to a PleurEvac with suction. Follow up CT images demonstrated a re-expanded left lung. The catheter was sutured to skin. FINDINGS: Large left pneumothorax. Chest tube placed from a left lateral approach. Left lung was re-expanded at the end of the procedure. IMPRESSION: Successful placement of a CT-guided left chest tube. Electronically Signed   By: Markus Daft M.D.   On: 04/18/2018 17:36     IMPRESSION AND PLAN:   67 year old female with past medical history of COPD, breast cancer, hypothyroidism, hypertension, recent admission for pneumothorax post bronchoscopy who returns back to the hospital today due to shortness of breath and chest tightness and noted to have a moderate to large left-sided pneumothorax.  1.  Recurrent left-sided pneumothorax- patient was recently hospitalized last week for similar reasons and had a chest tube placed which was removed this past Sunday with resolving pneumothorax and she was discharged home in stable condition.  She now returns back with worsening shortness of breath and chest tightness and noted to have another pneumothorax. - Seen by interventional radiology and is status post IR guided chest tube/pigtail catheter placement. - We will consult CT surgery, Dr. Genevive Bi for further management of her chest tube.  Continue oxygen supplementation. -I will get a repeat chest x-ray in the morning.  2.  Lung nodule- patient had a worrisome lung nodule which has been increasing in size which is why she underwent  bronchoscopy.  She is supposed to start radiation treatment but that is currently on hold given her complications post bronchoscopy pneumothorax.  Continue follow-up with oncology as an outpatient.  3.  Anxiety-continue Xanax.  4.  COPD-no acute exacerbation-continue Dulera.  5.  Essential hypertension-continue Toprol.  6. GERD - cont. Pepcid.     All the records are reviewed and case discussed with ED provider. Management plans discussed with the patient, family and they are in agreement.  CODE STATUS: Full code  TOTAL TIME TAKING CARE OF THIS PATIENT: 40 minutes.    Henreitta Leber M.D on 04/18/2018 at 5:44 PM  Between 7am to 6pm - Pager - [phone]  After 6pm go to [email or web address] - password [email or web address] [place]  Office  [phone]  CC: Primary care physician; Mar Daring, PA-C

## 2018-04-18 NOTE — Progress Notes (Signed)
Can't admit patient until she gets back from IR after get CT/Pigtail cath placed.

## 2018-04-18 NOTE — ED Notes (Signed)
Pt placed on non rebreather per MD Burlene Arnt

## 2018-04-18 NOTE — ED Triage Notes (Addendum)
Pt c/o SOB since 1130PM last night. Pt had outpatient chest xray and was sent to ER due to pneumothorax. Pt was just discharged 9/2 from hospital for pneumothorax where she had a chest tube-states that it was on left side.   Pt has lung cancer.   Denies CP. SOB with exertion   Pt alert and oriented X4, active, cooperative, pt in NAD. RR even and unlabored, color WNL.

## 2018-04-18 NOTE — ED Notes (Signed)
Patient transported to CT 

## 2018-04-18 NOTE — ED Provider Notes (Addendum)
[facility] Emergency Department Provider Note  ____________________________________________   I have reviewed the triage vital signs and the nursing notes. Where available I have reviewed prior notes and, if possible and indicated, outside hospital notes.    HISTORY  Chief Complaint Shortness of Breath    HPI Michelle Pitts is a 67 y.o. female history of recent pneumothorax after a procedure.  She was discharged from inpatient care on the second.  Chest tube was removed on the first.  She states she is been doing fine at home until the middle the night last night, shortly after going to bed when she stated she had a slight discomfort and a little bit of shortness of breath.  It has not progressed since that time.  She is not having chest pain at this time.  She went to see her oncologist who did a chest x-ray reportedly recurrent pneumothorax and she was sent to the emergency department.  She has no significant complaints at this time.  The pain was a sharp discomfort it was fleeting and on radiating,     Past Medical History:  Diagnosis Date  . Anxiety   . Breast cancer ([place]) 01/23/2014   Left breast, T1c, N0, M0. ER/PR +; Her 2 neu not overexpressed. Oncotype DX: 13,Low risk.9% over 10 years.   . Cancer ([place]) 1995   vulvar, [facility]  . COPD (chronic obstructive pulmonary disease) ([facility])    bronchitis  . Dysrhythmia   . GERD (gastroesophageal reflux disease)   . History of colon polyps   . Hypercholesterolemia   . Hypertension   . Hypothyroidism   . Insomnia   . 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 Personal history of radiation therapy   . Personal history of tobacco use, presenting hazards to health 11/18/2015  . Sleep apnea   . Sleep apnea   . SOB (shortness of breath) on exertion   . Spinal stenosis   . Thyroid disease   . Vitamin D deficiency     Patient Active Problem List   Diagnosis Date Noted  . Pneumothorax on left 04/18/2018  .  Pneumothorax 04/13/2018  . Pneumothorax after biopsy 04/12/2018  . Lung nodule 03/19/2018  . Bilateral leg numbness 11/10/2017  . Obesity, Class III, BMI 40-49.9 (morbid obesity) ([place]) 11/10/2017  . DNR no code (do not resuscitate) 02/08/2016  . Personal history of tobacco use, presenting hazards to health 11/18/2015  . SOB (shortness of breath) 07/24/2015  . Malignant neoplasm of lower-inner quadrant of left female breast ([place]) 07/22/2015  . Tachycardia 07/22/2015  . Anxiety 05/15/2015  . Colon polyp 05/15/2015  . CAFL (chronic airflow limitation) ([place]) 05/15/2015  . Elevated blood sugar 05/15/2015  . Abnormal liver enzymes 05/15/2015  . Family history of diabetes mellitus 05/15/2015  . BP (high blood pressure) 05/15/2015  . Cannot sleep 05/15/2015  . Apnea, sleep 05/15/2015  . Avitaminosis D 05/15/2015  . 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 Anxiety disorder 05/15/2015  . Chronic obstructive pulmonary disease ([place]) 05/15/2015  . Gastro-esophageal reflux disease without esophagitis 05/15/2015  . 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 Adult hypothyroidism 05/15/2015  . Pure hypercholesterolemia 05/15/2015  . 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 BLADDER SUSPENSION  2001  . BREAST BIOPSY Right 1975, 1985   benign cyst [name]) and fibroadenoma([facility])  . BREAST BIOPSY Right 01/03/2014   Papilloma, no atypia detected on mammography.  Marland Kitchen  BREAST BIOPSY Left 01/03/2014   Invasive mammary carcinoma.  Marland Kitchen BREAST BIOPSY Left 01/13/2015   ultrasound giuded biopsy, negative  . BREAST CYST ASPIRATION Left 1985  . BREAST LUMPECTOMY Left 01/23/2014   positive  . BREAST SURGERY Left 01/23/14   wide excision   . BREAST SURGERY  04/10/14   Debridement of fat necrosis  . CHOLECYSTECTOMY    . COLONOSCOPY  2014   Dr Tiffany Kocher  . COLONOSCOPY WITH PROPOFOL N/A 10/10/2016   Procedure: COLONOSCOPY WITH PROPOFOL;  Surgeon:  Manya Silvas, MD;  Location: [facility] ENDOSCOPY;  Service: Endoscopy;  Laterality: N/A;  . ELECTROMAGNETIC NAVIGATION BROCHOSCOPY N/A 04/12/2018   Procedure: ELECTROMAGNETIC NAVIGATION BRONCHOSCOPY;  Surgeon: Flora Lipps, MD;  Location: [facility] ORS;  Service: Cardiopulmonary;  Laterality: N/A;  . ROTATOR CUFF REPAIR Right 1995  . VAGINAL HYSTERECTOMY  1978   partial  . VULVECTOMY PARTIAL  1995  . wide excision debriedment Left 04/10/14    Prior to Admission medications   Medication Sig Start Date End Date Taking? Authorizing Provider  albuterol (PROAIR HFA) 108 (90 Base) MCG/ACT inhaler Inhale 2 puffs into the lungs every 6 (six) hours as needed for wheezing or shortness of breath. 03/21/17   Mar Daring, PA-C  ALPRAZolam Duanne Moron) 0.5 MG tablet Take 1 tablet (0.5 mg total) by mouth 2 (two) times daily. As needed. 11/30/17   Mar Daring, PA-C  aspirin EC 81 MG tablet Take 81 mg by mouth daily.    [provider]  cholecalciferol (VITAMIN D) 1000 units tablet Take 1 tablet (1,000 Units total) by mouth daily. 02/10/16   Margarita Rana, MD  doxycycline (VIBRA-TABS) 100 MG tablet Take 1 tablet (100 mg total) by mouth every 12 (twelve) hours for 5 days. 04/16/18 04/21/18  Dustin Flock, MD  fluticasone (FLONASE) 50 MCG/ACT nasal spray Place 2 sprays into both nostrils 2 (two) times daily as needed for allergies or rhinitis.    [provider]  Fluticasone-Salmeterol (ADVAIR) 250-50 MCG/DOSE AEPB Inhale 1 puff into the lungs 2 (two) times daily. Patient taking differently: Inhale 1 puff into the lungs 2 (two) times daily.  08/02/17   Mar Daring, PA-C  ketotifen (ZADITOR) 0.025 % ophthalmic solution Place 1 drop into both eyes 2 (two) times daily as needed.    [provider]  metoprolol succinate (TOPROL-XL) 25 MG 24 hr tablet Take 1 tablet (25 mg total) by mouth daily. 10/04/17   Mar Daring, PA-C  naproxen sodium (ALEVE) 220 MG tablet Take 220 mg  by mouth daily as needed.    [provider]  neomycin-bacitracin-polymyxin (NEOSPORIN) ointment Apply topically daily. 04/16/18   Dustin Flock, MD  ranitidine (ZANTAC) 150 MG tablet Take 150 mg by mouth at bedtime.     [provider]    Allergies Codeine; Hydrochlorothiazide; and Levaquin [levofloxacin in d5w]  Family History  Problem Relation Age of Onset  . Diabetes Father   . COPD Mother   . Heart failure Sister   . Emphysema Sister   . Diabetes Sister   . Hypertension Sister   . Emphysema Brother   . Lung cancer Brother   . Crohn's disease Sister   . Hypertension Sister   . Hypertension Sister   . Emphysema Brother   . Cancer Other        great great paternal aunt, ? age  . Cancer Maternal Grandfather 42       ? source  . Cancer Paternal Grandfather 41       ?  source  . Breast cancer Neg Hx     Social History Social History   Tobacco Use  . Smoking status: Former Smoker    Packs/day: 1.00    Years: 50.00    Pack years: 50.00    Types: Cigarettes    Last attempt to quit: 09/15/2014    Years since quitting: 3.5  . Smokeless tobacco: Never Used  Substance Use Topics  . Alcohol use: Yes    Alcohol/week: 14.0 standard drinks    Types: 14 Cans of beer per week  . Drug use: No   Review of Systems Constitutional: No fever/chills Eyes: No visual changes. ENT: No sore throat. No stiff neck no neck pain Cardiovascular: See HPI regarding chest pain. Respiratory: See HPI shortness of breath. Gastrointestinal:   no vomiting.  No diarrhea.  No constipation. Genitourinary: Negative for dysuria. Musculoskeletal: Negative lower extremity swelling Skin: Negative for rash. Neurological: Negative for severe headaches, focal weakness or numbness.   ____________________________________________   PHYSICAL EXAM:  VITAL SIGNS: ED Triage Vitals [04/18/18 1437]  Enc Vitals Group     BP (!) 140/95     Pulse Rate 85     Resp 18     Temp 98.5 F (36.9  C)     Temp Source Oral     SpO2 95 %     Weight 244 lb 11.4 oz (111 kg)     Height 5\' 5"  (1.651 m)     Head Circumference      Peak Flow      Pain Score 0     Pain Loc      Pain Edu?      Excl. in [place]?     Constitutional: Alert and oriented. Well appearing and in no acute distress. Eyes: Conjunctivae are normal Head: Atraumatic HEENT: No congestion/rhinnorhea. Mucous membranes are moist.  Oropharynx non-erythematous Neck:   Nontender with no meningismus, no masses, no stridor Cardiovascular: Normal rate, regular rhythm. Grossly normal heart sounds.  Good peripheral circulation. Respiratory: Normal respiratory effort.  No retractions.  Sounds diminished in the left apex  abdominal: Soft and nontender. No distention. No guarding no rebound Back:  There is no focal tenderness or step off.  there is no midline tenderness there are no lesions noted. there is no CVA tenderness Musculoskeletal: No lower extremity tenderness, no upper extremity tenderness. No joint effusions, no DVT signs strong distal pulses no edema Neurologic:  Normal speech and language. No gross focal neurologic deficits are appreciated.  Skin:  Skin is warm, dry and intact. No rash noted. Psychiatric: Mood and affect are normal. Speech and behavior are normal.  ____________________________________________   LABS (all labs ordered are listed, but only abnormal results are displayed)  Labs Reviewed - No data to display  Pertinent labs  results that were available during my care of the patient were reviewed by me and considered in my medical decision making (see chart for details). ____________________________________________  EKG  I personally interpreted any EKGs ordered by me or triage Sinus rhythm rate 86 bpm no acute ST elevation or depression, normal axis nonspecific ST changes ____________________________________________  RADIOLOGY  Pertinent labs & imaging results that were available during my care of  the patient were reviewed by me and considered in my medical decision making (see chart for details). If possible, patient and/or family made aware of any abnormal findings.  Dg Chest 2 View  Addendum Date: 04/18/2018   ADDENDUM REPORT: 04/18/2018 14:14 ADDENDUM: Critical Value/emergent results were  called by telephone at the time of interpretation on 04/18/2018 at 1410 hours to Dr. Charlaine Dalton , who verbally acknowledged these results. Electronically Signed   By: Genevie Ann M.D.   On: 04/18/2018 14:14   Result Date: 04/18/2018 CLINICAL DATA:  67 year old female with left side pneumothorax following endoscopic biopsy treated with chest tube placement. Chest tube removed on 04/16/2018, but worsening shortness of breath today. EXAM: CHEST - 2 VIEW COMPARISON:  04/16/2018 and earlier. FINDINGS: Recurrent moderate size left pneumothorax with pleural edge is visible in the left upper lung and laterally. Mildly lower underlying lung volumes. Mediastinal contours remain normal. The right lung remains clear. Visualized tracheal air column is within normal limits. No pleural effusion. No acute osseous abnormality identified. Negative visible bowel gas pattern. IMPRESSION: Recurrent moderate size left pneumothorax. Electronically Signed: By: Genevie Ann M.D. On: 04/18/2018 14:04   ____________________________________________    PROCEDURES  Procedure(s) performed: None  Procedures  Critical Care performed: CRITICAL CARE Performed by: Schuyler Amor   Total critical care time: 45 minutes  Critical care time was exclusive of separately billable procedures and treating other patients.  Critical care was necessary to treat or prevent imminent or life-threatening deterioration.  Critical care was time spent personally by me on the following activities: development of treatment plan with patient and/or surrogate as well as nursing, discussions with consultants, evaluation of patient's response to treatment,  examination of patient, obtaining history from patient or surrogate, ordering and performing treatments and interventions, ordering and review of laboratory studies, ordering and review of radiographic studies, pulse oximetry and re-evaluation of patient's condition.   ____________________________________________   INITIAL IMPRESSION / ASSESSMENT AND PLAN / ED COURSE  Pertinent labs & imaging results that were available during my care of the patient were reviewed by me and considered in my medical decision making (see chart for details).  Pt with recurrent left-sided pneumothorax, which she is in no acute distress sats are 100% blood pressure is fine no evidence of tension physiology or mediastinal diversion on x-ray.  Patient was sent in here by oncology for definitive management.  This is likely been out since the middle of the night last night, so approaching 14 or 15 hours.  We have placed the patient on nonrebreather.  States that Dr. Faith Rogue is to be the one to place this in.  In looking at the x-ray, it is a very apical pneumothorax, our usual approach in the ER with have to be modified to more anterior approach.  This has been done by IR in the past.  If Dr. Faith Rogue cannot place it we will consider discussing with IR patient is in no distress we are monitoring her closely here.  IV has been established.  She is not on anticoagulation aside from aspirin  ----------------------------------------- 3:04 PM on 04/18/2018 -----------------------------------------  Have paged dr. Faith Rogue.  ----------------------------------------- 3:21 PM on 04/18/2018 -----------------------------------------  Paging dr. Faith Rogue again.  ----------------------------------------- 3:27 PM on 04/18/2018 -----------------------------------------  Pt stable. On non rebreather here. They did ask me to talk to Dr. Genevive Bi as they felt he was already consulted by oncology. I did talk to Dr. Genevive Bi, he did not place the tube  last time (IR did) and feels that they should manage the replacement of the tube this time. Though he is happy to consult if the pt is admitted.   Paging oncology who sent pt in.  She is in nad.   ----------------------------------------- 3:58 PM on 04/18/2018 ----------------------------------------- I discussed with  Dr. Rogue Bussing, who quested IR placed the tube given the nature of the pneumothorax.  I did offer to do it here but that was his request.  Does not to be a bad idea given the distribution of the pneumothorax.  I then called IR, they very graciously have accepted the patient to their department for urgent placement of tube.  Admitting to the hospitalist.       ____________________________________________   FINAL CLINICAL IMPRESSION(S) / ED DIAGNOSES  Final diagnoses:  None      This chart was dictated using voice recognition software.  Despite best efforts to proofread,  errors can occur which can change meaning.      Schuyler Amor, MD 04/18/18 1547    Schuyler Amor, MD 04/18/18 1601    Schuyler Amor, MD 04/18/18 [phone]

## 2018-04-18 NOTE — ED Notes (Signed)
Pt back from procedure at this time, admitting MD at bedside. Pt A&OX4, NAD noted. Chest tube placed to wall suction at this time, NAD ntoed.

## 2018-04-18 NOTE — Consult Note (Signed)
Chief Complaint: Patient was seen in consultation today for recurrent left pneumothorax and chest tube placement  Referring Physician(s): Charlotte Crumb  Patient Status: Michelle Pitts - ED  History of Present Illness: Michelle Pitts is a 67 y.o. female who is known to the Interventional Radiology service.  Patient recently developed a left pneumothorax following a bronchoscopic lung biopsy.  A CT-guided chest tube was placed on 04/13/2018.  The left pneumothorax resolved and the chest tube was removed.  The patient was discharged on 04/16/2018.  Patient was seen by Oncology today and had dyspnea.  Patient reports some chest discomfort and shortness of breath.  Chest radiograph demonstrated a moderate recurrent left pneumothorax.  Patient was sent to the emergency department and now presents for pneumothorax management.  Patient is currently on supplemental oxygen and no significant distress at this time.  Past Medical History:  Diagnosis Date  . Anxiety   . Breast cancer ([place]) 01/23/2014   Left breast, T1c, N0, M0. ER/PR +; Her 2 neu not overexpressed. Oncotype DX: 13,Low risk.9% over 10 years.   . Cancer ([place]) 1995   vulvar, [facility]  . COPD (chronic obstructive pulmonary disease) ([facility])    bronchitis  . Dysrhythmia   . GERD (gastroesophageal reflux disease)   . History of colon polyps   . Hypercholesterolemia   . Hypertension   . Hypothyroidism   . Insomnia   . 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 Personal history of radiation therapy   . Personal history of tobacco use, presenting hazards to health 11/18/2015  . Sleep apnea   . Sleep apnea   . SOB (shortness of breath) on exertion   . Spinal stenosis   . Thyroid disease   . Vitamin D deficiency     Past Surgical History:  Procedure Laterality Date  . BLADDER SUSPENSION  2001  . BREAST BIOPSY Right 1975, 1985   benign cyst [name]) and fibroadenoma([facility])  . BREAST BIOPSY Right 01/03/2014   Papilloma, no atypia  detected on mammography.  Marland Kitchen BREAST BIOPSY Left 01/03/2014   Invasive mammary carcinoma.  Marland Kitchen BREAST BIOPSY Left 01/13/2015   ultrasound giuded biopsy, negative  . BREAST CYST ASPIRATION Left 1985  . BREAST LUMPECTOMY Left 01/23/2014   positive  . BREAST SURGERY Left 01/23/14   wide excision   . BREAST SURGERY  04/10/14   Debridement of fat necrosis  . CHOLECYSTECTOMY    . COLONOSCOPY  2014   Dr Tiffany Kocher  . COLONOSCOPY WITH PROPOFOL N/A 10/10/2016   Procedure: COLONOSCOPY WITH PROPOFOL;  Surgeon: Manya Silvas, MD;  Location[facility] ENDOSCOPY;  Service: Endoscopy;  Laterality: N/A;  . ELECTROMAGNETIC NAVIGATION BROCHOSCOPY N/A 04/12/2018   Procedure: ELECTROMAGNETIC NAVIGATION BRONCHOSCOPY;  Surgeon: Flora Lipps, MD;  Location: [facility] ORS;  Service: Cardiopulmonary;  Laterality: N/A;  . ROTATOR CUFF REPAIR Right 1995  . VAGINAL HYSTERECTOMY  1978   partial  . VULVECTOMY PARTIAL  1995  . wide excision debriedment Left 04/10/14    Allergies: Codeine; Hydrochlorothiazide; and Levaquin [levofloxacin in d5w]  Medications: Prior to Admission medications   Medication Sig Start Date End Date Taking? Authorizing Provider  albuterol (PROAIR HFA) 108 (90 Base) MCG/ACT inhaler Inhale 2 puffs into the lungs every 6 (six) hours as needed for wheezing or shortness of breath. 03/21/17  Yes Mar Daring, PA-C  ALPRAZolam Duanne Moron) 0.5 MG tablet Take 1 tablet (0.5 mg total) by mouth 2 (two) times daily. As needed. 11/30/17  Yes Mar Daring, PA-C  aspirin  EC 81 MG tablet Take 81 mg by mouth daily.   Yes [provider]  cholecalciferol (VITAMIN D) 1000 units tablet Take 1 tablet (1,000 Units total) by mouth daily. 02/10/16  Yes Margarita Rana, MD  doxycycline (VIBRA-TABS) 100 MG tablet Take 1 tablet (100 mg total) by mouth every 12 (twelve) hours for 5 days. 04/16/18 04/21/18 Yes Dustin Flock, MD  fluticasone (FLONASE) 50 MCG/ACT nasal spray Place 2 sprays into both nostrils 2 (two) times  daily as needed for allergies or rhinitis.   Yes [provider]  Fluticasone-Salmeterol (ADVAIR) 250-50 MCG/DOSE AEPB Inhale 1 puff into the lungs 2 (two) times daily. Patient taking differently: Inhale 1 puff into the lungs 2 (two) times daily.  08/02/17  Yes Mar Daring, PA-C  ketotifen (ZADITOR) 0.025 % ophthalmic solution Place 1 drop into both eyes 2 (two) times daily as needed.   Yes [provider]  metoprolol succinate (TOPROL-XL) 25 MG 24 hr tablet Take 1 tablet (25 mg total) by mouth daily. 10/04/17  Yes Mar Daring, PA-C  naproxen sodium (ALEVE) 220 MG tablet Take 220 mg by mouth daily as needed.   Yes [provider]  neomycin-bacitracin-polymyxin (NEOSPORIN) ointment Apply topically daily. 04/16/18  Yes Dustin Flock, MD  ranitidine (ZANTAC) 150 MG tablet Take 150 mg by mouth at bedtime.    Yes [provider]     Family History  Problem Relation Age of Onset  . Diabetes Father   . COPD Mother   . Heart failure Sister   . Emphysema Sister   . Diabetes Sister   . Hypertension Sister   . Emphysema Brother   . Lung cancer Brother   . Crohn's disease Sister   . Hypertension Sister   . Hypertension Sister   . Emphysema Brother   . Cancer Other        great great paternal aunt, ? age  . Cancer Maternal Grandfather 69       ? source  . Cancer Paternal Grandfather 50       ? source  . Breast cancer Neg Hx     Social History   Socioeconomic History  . Marital status: Divorced    Spouse name: Not on file  . Number of children: 2  . Years of education: Not on file  . Highest education level: Some college, no degree  Occupational History  . Occupation: retired  Scientific laboratory technician  . Financial resource strain: Not hard at all  . Food insecurity:    Worry: Never true    Inability: Never true  . Transportation needs:    Medical: No    Non-medical: No  Tobacco Use  . Smoking status: Former Smoker    Packs/day: 1.00     Years: 50.00    Pack years: 50.00    Types: Cigarettes    Last attempt to quit: 09/15/2014    Years since quitting: 3.5  . Smokeless tobacco: Never Used  Substance and Sexual Activity  . Alcohol use: Yes    Alcohol/week: 14.0 standard drinks    Types: 14 Cans of beer per week  . Drug use: No  . Sexual activity: Not Currently  Lifestyle  . Physical activity:    Days per week: Not on file    Minutes per session: Not on file  . Stress: Not at all  Relationships  . Social connections:    Talks on phone: Not on file    Gets together: Not on file  Attends religious service: Not on file    Active member of club or organization: Not on file    Attends meetings of clubs or organizations: Not on file    Relationship status: Not on file  Other Topics Concern  . Not on file  Social History Narrative  . Not on file     Review of Systems  Respiratory: Positive for shortness of breath.   Cardiovascular: Positive for chest pain.    Vital Signs: BP (!) 140/95 (BP Location: Left Arm)   Pulse 79   Temp 98.5 F (36.9 C) (Oral)   Resp 13   Ht 5\' 5"  (1.651 m)   Wt 111 kg   SpO2 100%   BMI 40.72 kg/m   Physical Exam  HENT:  Mouth/Throat: Oropharynx is clear and moist.  Cardiovascular:  Cardiac sounds were difficult to hear due to body habitus.  Pulmonary/Chest: Effort normal.  Decreased breath sounds in the left lung.  Abdominal: Soft. She exhibits no distension. There is no tenderness.    Imaging: Dg Chest 2 View  Addendum Date: 04/18/2018   ADDENDUM REPORT: 04/18/2018 14:14 ADDENDUM: Critical Value/emergent results were called by telephone at the time of interpretation on 04/18/2018 at 1410 hours to Dr. Charlaine Dalton , who verbally acknowledged these results. Electronically Signed   By: Genevie Ann M.D.   On: 04/18/2018 14:14   Result Date: 04/18/2018 CLINICAL DATA:  67 year old female with left side pneumothorax following endoscopic biopsy treated with chest tube placement.  Chest tube removed on 04/16/2018, but worsening shortness of breath today. EXAM: CHEST - 2 VIEW COMPARISON:  04/16/2018 and earlier. FINDINGS: Recurrent moderate size left pneumothorax with pleural edge is visible in the left upper lung and laterally. Mildly lower underlying lung volumes. Mediastinal contours remain normal. The right lung remains clear. Visualized tracheal air column is within normal limits. No pleural effusion. No acute osseous abnormality identified. Negative visible bowel gas pattern. IMPRESSION: Recurrent moderate size left pneumothorax. Electronically Signed: By: Genevie Ann M.D. On: 04/18/2018 14:04   Dg Chest Port 1 View  Result Date: 04/16/2018 CLINICAL DATA:  Status post bronchoscopy 04/12/2018. Status post left chest tube removal today. Shortness of breath. EXAM: PORTABLE CHEST 1 VIEW COMPARISON:  Single-view of the chest 04/15/2018. FINDINGS: Pigtail catheter has been removed from the left chest. No pneumothorax is identified. The lungs are emphysematous but appear clear. Heart size is normal. No pneumothorax or pleural effusion. IMPRESSION: Left chest tube has been removed. Negative for pneumothorax. No acute disease. Electronically Signed   By: Inge Rise M.D.   On: 04/16/2018 11:02   Dg Chest Port 1 View  Result Date: 04/15/2018 CLINICAL DATA:  Evaluate chest tube EXAM: PORTABLE CHEST 1 VIEW COMPARISON:  Chest radiograph 04/15/2018 FINDINGS: Normal heart size. Bibasilar atelectasis. Left chest tube remains in position. No definite left-sided pneumothorax. IMPRESSION: Left chest tube remains in place. No definite left-sided pneumothorax. Electronically Signed   By: Lovey Newcomer M.D.   On: 04/15/2018 17:36   Dg Chest Port 1 View  Result Date: 04/15/2018 CLINICAL DATA:  Patient status post bronchoscopy 04/12/2018. Left chest tube in place. EXAM: PORTABLE CHEST 1 VIEW COMPARISON:  Single-view of the chest 04/14/2018 and 04/13/2018. CT chest 04/12/2018. FINDINGS: Pigtail catheter  remains in place in the left chest. No pneumothorax. Lungs are clear. Heart size is normal. No pleural effusion. No acute bony abnormality. IMPRESSION: No acute disease. Negative for pneumothorax with a left chest tube in place. Electronically Signed   By:  Inge Rise M.D.   On: 04/15/2018 09:09   Dg Chest Port 1 View  Result Date: 04/14/2018 CLINICAL DATA:  Follow-up pneumothorax EXAM: PORTABLE CHEST 1 VIEW COMPARISON:  Yesterday FINDINGS: Normal heart size and mediastinal contours. There is bronchitic airway thickening that is chronic. No visible pneumothorax. Stable left chest tube positioning. Normal heart size. IMPRESSION: No visible pneumothorax. Electronically Signed   By: Monte Fantasia M.D.   On: 04/14/2018 08:36   Dg Chest Port 1 View  Result Date: 04/13/2018 CLINICAL DATA:  Left-sided chest tube placement. EXAM: PORTABLE CHEST 1 VIEW COMPARISON:  One-view chest x-ray of the same day at 6 o'clock a.m. FINDINGS: Heart size is normal. A left-sided pigtail drainage catheter has been placed in the interval. The left lung is re-expanded. No significant residual pneumothorax is present. Left basilar airspace disease likely reflects atelectasis. IMPRESSION: 1. Re-expansion of the left lung following placement of a pigtail all purpose drainage catheter. Electronically Signed   By: San Morelle M.D.   On: 04/13/2018 15:38   Portable Chest 1 View  Result Date: 04/13/2018 CLINICAL DATA:  67 y/o  F; pneumothorax. EXAM: PORTABLE CHEST 1 VIEW COMPARISON:  04/12/2018 chest radiograph. FINDINGS: Stable cardiac silhouette. No rightward mediastinal shift. Mild interval progression of the left-sided pneumothorax. Increasing atelectasis in the left lung. Nodular opacities projecting over the left lower lung zone again noted. No acute osseous abnormality identified. IMPRESSION: Mild interval progression of left-sided pneumothorax. No mediastinal shift. These results will be called to the ordering  clinician or representative by the Radiologist Assistant, and communication documented in the PACS or zVision Dashboard. Electronically Signed   By: Kristine Garbe M.D.   On: 04/13/2018 06:28   Dg Chest Port 1 View  Result Date: 04/12/2018 CLINICAL DATA:  Status post bronchoscopy. EXAM: PORTABLE CHEST 1 VIEW COMPARISON:  Prior CTs and radiographs FINDINGS: The cardiomediastinal silhouette is unremarkable. A small LEFT apical pneumothorax is noted, approximately 15%. Nodular opacity overlying the mid-lower LEFT lung again noted. There is no evidence of pleural effusion. No other changes identified. IMPRESSION: Small LEFT apical pneumothorax, approximately 15%. Critical Value/emergent results were called by telephone at the time of interpretation on 04/12/2018 at 2:59 pm to [facility], nurse for this patient, who verbally acknowledged these results. Electronically Signed   By: Margarette Canada M.D.   On: 04/12/2018 14:59   Dg C-arm 1-60 Min-no Report  Result Date: 04/12/2018 Fluoroscopy was utilized by the requesting physician.  No radiographic interpretation.   Ct Image Guided Fluid Drain By Catheter  Result Date: 04/13/2018 INDICATION: Enlarging symptomatic left-sided pneumothorax following endoscopic biopsy. EXAM: CT-GUIDED LEFT-SIDED CHEST TUBE PLACEMENT COMPARISON:  Chest radiograph-earlier same day; 04/12/2018 MEDICATIONS: The patient is currently admitted to the hospital and receiving intravenous antibiotics. The antibiotics were administered within an appropriate time frame prior to the initiation of the procedure. ANESTHESIA/SEDATION: Moderate (conscious) sedation was employed during this procedure. A total of Versed 150 mg and Fentanyl 3 mcg was administered intravenously. Moderate Sedation Time: 24 minutes. The patient's level of consciousness and vital signs were monitored continuously by radiology nursing throughout the procedure under my direct supervision. CONTRAST:  None COMPLICATIONS:  None immediate. PROCEDURE: Informed written consent was obtained from the patient after a discussion of the risks, benefits and alternatives to treatment. The patient was placed supine on the CT gantry and a pre procedural CT was performed re-demonstrating the large left-sided pneumothorax. The procedure was planned. A timeout was performed prior to the initiation of the procedure. The  skin overlying the anterior aspect the left upper chest was prepped and draped in the usual sterile fashion. The overlying soft tissues were anesthetized with 1% lidocaine with epinephrine. Appropriate trajectory was planned with the use of a 22 gauge spinal needle. An 18 gauge trocar needle was advanced into the left pleural space and a short Amplatz super stiff wire was coiled within the apex of the left pleural space. Appropriate positioning was confirmed with a limited CT scan. The tract was serially dilated allowing placement of a 10 [place] all-purpose drainage catheter. Appropriate positioning was confirmed with a limited postprocedural CT scan. The drainage catheter was connected to a pleura vac and sutured in place. Postprocedural imaging demonstrated near complete evacuation of the left-sided pneumothorax. A dressing was placed. The patient tolerated the procedure well without immediate post procedural complication. IMPRESSION: Successful CT guided placement of a 10 French all purpose drain catheter into the apical aspect of the left pleural space for enlarging symptomatic left-sided pneumothorax. Electronically Signed   By: Sandi Mariscal M.D.   On: 04/13/2018 15:02   Ct Super D Chest Wo Contrast  Result Date: 04/12/2018 CLINICAL DATA:  Super D chest for biopsy planning. EXAM: CT CHEST WITHOUT CONTRAST TECHNIQUE: Multidetector CT imaging of the chest was performed using thin slice collimation for electromagnetic bronchoscopy planning purposes, without intravenous contrast. COMPARISON:  PET-CT 03/06/2018 and chest CT  12/06/2017. FINDINGS: Cardiovascular: The heart is normal in size. No pericardial effusion. Stable mild tortuosity and calcification of the thoracic aorta. Stable scattered coronary artery calcifications. Mediastinum/Nodes: No mediastinal or hilar mass or lymphadenopathy. The esophagus is grossly normal. Lungs/Pleura: Stable 18 mm ground-glass nodule in the left lower lobe on image number 29. No other pulmonary lesions are identified. No acute pulmonary findings. Scattered bullous changes. Upper Abdomen: No significant upper abdominal findings. Status post cholecystectomy without biliary dilatation. No hepatic or adrenal gland lesions are identified. Musculoskeletal: No worrisome breast lesions. Evidence of prior left breast surgery and possible radiation. No supraclavicular or axillary adenopathy. No significant bony findings. IMPRESSION: 1. Stable 18 mm ground-glass nodule in the left lower lobe. 2. No enlarged mediastinal or hilar lymph nodes. 3. Evidence of previous left breast surgery and possible radiation but no recurrent breast mass or left axillary adenopathy. Aortic Atherosclerosis (ICD10-I70.0) and Emphysema (ICD10-J43.9). Electronically Signed   By: Marijo Sanes M.D.   On: 04/12/2018 15:15    Labs:  CBC: Recent Labs    04/05/18 0951 04/12/18 1712 04/13/18 1051 04/14/18 0435 04/18/18 1531  WBC 5.6 7.9  --  5.8 6.9  HGB 13.5 13.1  --  10.9* 13.9  HCT 39.2 38.5  --  32.2* 40.8  PLT 220 223 212 187 248    COAGS: Recent Labs    04/13/18 1051 04/18/18 1531  INR 0.92 0.96  APTT 25  --     BMP: Recent Labs    02/14/18 1512 04/05/18 0951 04/12/18 1712 04/14/18 0435 04/18/18 1531  NA 141 142  --  140 141  K 4.5 4.6  --  4.2 4.5  CL 103 107  --  105 104  CO2 23 29  --  27 27  GLUCOSE 102* 104*  --  99 99  BUN 19 15  --  17 17  CALCIUM 10.2 9.6  --  8.6* 9.9  CREATININE 0.80 0.68 0.79 0.71 0.76  GFRNONAA 76 >60 >60 >60 >60  GFRAA 88 >60 >60 >60 >60    LIVER FUNCTION  TESTS: Recent Labs  02/14/18 1512  BILITOT 0.2  AST 28  ALT 41*  ALKPHOS 134*  PROT 6.9  ALBUMIN 4.4    TUMOR MARKERS: No results for input(s): AFPTM, CEA, CA199, CHROMGRNA in the last 8760 hours.  Assessment and Plan:  67 year old with a recurrent left pneumothorax that requires placement of a chest tube.  Patient recently had a chest tube and is very familiar with CT-guided chest tube placement.  Informed consent was obtained for chest tube placement with moderate sedation.  Patient will be admitted to the hospital following chest tube placement.   Thank you for this interesting consult.  I greatly enjoyed meeting Naziyah L Esker and look forward to participating in their care.  A copy of this report was sent to the requesting provider on this date.  Electronically Signed: Burman Riis, MD 04/18/2018, 4:36 PM   I spent a total of    15 Minutes in face to face in clinical consultation, greater than 50% of which was counseling/coordinating care for left pneumothorax.

## 2018-04-18 NOTE — ED Notes (Signed)
2C states OK to bring pt at this time.

## 2018-04-18 NOTE — Assessment & Plan Note (Addendum)
#   18 mm left lower lobe lung groundglass opacity incidentally noted PET scan shows no evidence of hypermetabolism.  Highly suspicious for slow-growing adenocarcinoma. S/p Bronch- atypical cells on cytology; no obvious malignancy noted however. Await eval with Dr.Crystal for possible role for radiation given high clinical suspicion for malignancy.  #Shortness of breath worsened-especially given the recent pneumothorax; I would recommend STAT CXR  # COPD-moderate to severe- stable/see above.  #Follow-up to be decided  Addendum: Chest x-ray done showed moderate-sized left pneumothorax; patient recommended to go to the emergency room.  Discussed with Dr. Genevive Bi; is to evaluate the patient in the floor.  Discussed with ER physician-plan is to placement/or IR.  #I independently reviewed the imaging-which shows moderate-sized left-sided pneumothorax.  Plan as above.

## 2018-04-18 NOTE — ED Notes (Signed)
Breath sounds clear to right side.

## 2018-04-18 NOTE — Progress Notes (Signed)
Anticoagulation monitoring(Lovenox):   67 yo female ordered Lovenox 40 mg Q24h  Filed Weights   04/18/18 1437  Weight: 244 lb 11.4 oz (111 kg)   BMI 40.72   Lab Results  Component Value Date   CREATININE 0.76 04/18/2018   CREATININE 0.71 04/14/2018   CREATININE 0.79 04/12/2018   Estimated Creatinine Clearance: 83.5 mL/min (by C-G formula based on SCr of 0.76 mg/dL). Hemoglobin & Hematocrit     Component Value Date/Time   HGB 13.9 04/18/2018 1531   HGB 12.8 02/14/2018 1512   HCT 40.8 04/18/2018 1531   HCT 39.4 02/14/2018 1512     Per Protocol for Patient with estCrcl > 30 ml/min and BMI > 40, will transition to Lovenox 40 mg Q12h.

## 2018-04-18 NOTE — Progress Notes (Signed)
[place] NOTE  Patient Care Team: Rubye Beach as PCP - General (Family Medicine) Bary Castilla, Forest Gleason, MD (General Surgery) Vladimir Crofts, MD as Consulting Physician (Neurology) Telford Nab, RN as Registered Nurse  CHIEF COMPLAINTS/PURPOSE OF CONSULTATION:  Lung mass.  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  female history of COPD/prior history of smoking currently status post bronchoscopy for left lower lobe groundglass opacity is here for follow-up.  Unfortunately patient's post bronchoscopy his course was complicated by left-sided pneumothorax.  Patient had a chest tube.  Patient had been discharged home for the last 4 days.  This morning patient complains of worsening shortness of breath especially with walking for short distance.  She denies any chest pain.  Mild cough.   Review of Systems  Constitutional: Positive for malaise/fatigue. Negative for chills, diaphoresis, fever and weight loss.  HENT: Negative for nosebleeds and sore throat.   Eyes: Negative for double vision.  Respiratory: Positive for cough and shortness of breath. Negative for hemoptysis, sputum production and wheezing.   Cardiovascular: Negative for chest pain, palpitations, orthopnea and leg swelling.  Gastrointestinal: Negative for abdominal pain, blood in stool, constipation, diarrhea,  heartburn, melena, nausea and vomiting.  Genitourinary: Negative for dysuria, frequency and urgency.  Musculoskeletal: Positive for joint pain. Negative for back pain.  Skin: Negative.  Negative for itching and rash.  Neurological: Negative for dizziness, tingling, focal weakness, weakness and headaches.  Endo/Heme/Allergies: Does not bruise/bleed easily.  Psychiatric/Behavioral: Negative for depression. The patient is not nervous/anxious and does not have insomnia.      MEDICAL HISTORY:  Past Medical History:  Diagnosis Date  . Anxiety   . Breast cancer ([place]) 01/23/2014   Left breast, T1c, N0, M0. ER/PR +; Her 2 neu not overexpressed. Oncotype DX: 13,Low risk.9% over 10 years.   . Cancer ([place]) 1995   vulvar, [facility]  . COPD (chronic obstructive pulmonary disease) (HCC)    bronchitis  . Dysrhythmia   . GERD (gastroesophageal reflux disease)   . History of colon polyps   . Hypercholesterolemia   . Hypertension   . Hypothyroidism   . Insomnia   . 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 Personal history of radiation therapy   . Personal history of tobacco use, presenting hazards to health 11/18/2015  . Sleep apnea   . Sleep apnea   . SOB (shortness of breath) on exertion   . Spinal stenosis   . Thyroid disease   . Vitamin D deficiency     SURGICAL HISTORY: Past Surgical History:  Procedure Laterality Date  . BLADDER SUSPENSION  2001  . BREAST BIOPSY Right 1975, 1985   benign cyst [name]) and fibroadenoma([facility])  . BREAST BIOPSY Right 01/03/2014   Papilloma, no atypia detected on mammography.  Marland Kitchen BREAST BIOPSY Left 01/03/2014   Invasive mammary carcinoma.  Marland Kitchen BREAST BIOPSY  Left 01/13/2015   ultrasound giuded biopsy, negative  . BREAST CYST ASPIRATION Left 1985  . BREAST LUMPECTOMY Left 01/23/2014   positive  . BREAST SURGERY Left 01/23/14   wide excision   . BREAST SURGERY  04/10/14   Debridement of fat necrosis  . CHOLECYSTECTOMY    . COLONOSCOPY  2014   Dr Tiffany Kocher   . COLONOSCOPY WITH PROPOFOL N/A 10/10/2016   Procedure: COLONOSCOPY WITH PROPOFOL;  Surgeon: Manya Silvas, MD;  Location: [facility] ENDOSCOPY;  Service: Endoscopy;  Laterality: N/A;  . ELECTROMAGNETIC NAVIGATION BROCHOSCOPY N/A 04/12/2018   Procedure: ELECTROMAGNETIC NAVIGATION BRONCHOSCOPY;  Surgeon: Flora Lipps, MD;  Location: [facility] ORS;  Service: Cardiopulmonary;  Laterality: N/A;  . ROTATOR CUFF REPAIR Right 1995  . VAGINAL HYSTERECTOMY  1978   partial  . VULVECTOMY PARTIAL  1995  . wide excision debriedment Left 04/10/14    SOCIAL HISTORY: 50-pack-year history of smoking.  Quit 2016.  No alcohol abuse. Social History   Socioeconomic History  . Marital status: Divorced    Spouse name: Not on file  . Number of children: 2  . Years of education: Not on file  . Highest education level: Some college, no degree  Occupational History  . Occupation: retired  Scientific laboratory technician  . Financial resource strain: Not hard at all  . Food insecurity:    Worry: Never true    Inability: Never true  . Transportation needs:    Medical: No    Non-medical: No  Tobacco Use  . Smoking status: Former Smoker    Packs/day: 1.00    Years: 50.00    Pack years: 50.00    Types: Cigarettes    Last attempt to quit: 09/15/2014    Years since quitting: 3.5  . Smokeless tobacco: Never Used  Substance and Sexual Activity  . Alcohol use: Yes    Alcohol/week: 14.0 standard drinks    Types: 14 Cans of beer per week  . Drug use: No  . Sexual activity: Not Currently  Lifestyle  . Physical activity:    Days per week: Not on file    Minutes per session: Not on file  . Stress: Not at all  Relationships  . Social connections:    Talks on phone: Not on file    Gets together: Not on file    Attends religious service: Not on file    Active member of club or organization: Not on file    Attends meetings of clubs or organizations: Not on file    Relationship status: Not on file  . Intimate partner violence:     Fear of current or ex partner: Not on file    Emotionally abused: Not on file    Physically abused: Not on file    Forced sexual activity: Not on file  Other Topics Concern  . Not on file  Social History Narrative  . Not on file    FAMILY HISTORY: Family History  Problem Relation Age of Onset  . Diabetes Father   . COPD Mother   . Heart failure Sister   . Emphysema Sister   . Diabetes Sister   . Hypertension Sister   . Emphysema Brother   . Lung cancer Brother   . Crohn's disease Sister   . Hypertension Sister   . Hypertension Sister   . Emphysema Brother   . Cancer Other        great great paternal aunt, ? age  . Cancer Maternal Grandfather 78       ?  source  . Cancer Paternal Grandfather 66       ? source  . Breast cancer Neg Hx     ALLERGIES:  is allergic to codeine; hydrochlorothiazide; and levaquin [levofloxacin in d5w].  MEDICATIONS:  No current facility-administered medications for this visit.    No current outpatient medications on file.   Facility-Administered Medications Ordered in Other Visits  Medication Dose Route Frequency Provider Last Rate Last Dose  . acetaminophen (TYLENOL) tablet 650 mg  650 mg Oral Q6H PRN Henreitta Leber, MD       Or  . acetaminophen (TYLENOL) suppository 650 mg  650 mg Rectal Q6H PRN Sainani, Belia Heman, MD      . albuterol (PROVENTIL) (2.5 MG/3ML) 0.083% nebulizer solution 2.5 mg  2.5 mg Inhalation Q6H PRN Henreitta Leber, MD      . ALPRAZolam Duanne Moron) tablet 0.5 mg  0.5 mg Oral BID Henreitta Leber, MD      . aspirin EC tablet 81 mg  81 mg Oral Daily Sainani, Belia Heman, MD      . cholecalciferol (VITAMIN D) tablet 1,000 Units  1,000 Units Oral Daily Sainani, Belia Heman, MD      . enoxaparin (LOVENOX) injection 40 mg  40 mg Subcutaneous Q12H Sainani, Belia Heman, MD      . famotidine (PEPCID) tablet 20 mg  20 mg Oral BID Henreitta Leber, MD      . fentaNYL (SUBLIMAZE) 100 MCG/2ML injection           . fluticasone (FLONASE) 50 MCG/ACT  nasal spray 2 spray  2 spray Each Nare BID PRN Henreitta Leber, MD      . metoprolol succinate (TOPROL-XL) 24 hr tablet 25 mg  25 mg Oral Daily Sainani, Belia Heman, MD      . midazolam (VERSED) 5 MG/5ML injection           . mometasone-formoterol (DULERA) 200-5 MCG/ACT inhaler 2 puff  2 puff Inhalation BID Sainani, Belia Heman, MD      . ondansetron (ZOFRAN) tablet 4 mg  4 mg Oral Q6H PRN Henreitta Leber, MD       Or  . ondansetron (ZOFRAN) injection 4 mg  4 mg Intravenous Q6H PRN Henreitta Leber, MD          .  PHYSICAL EXAMINATION: ECOG PERFORMANCE STATUS: 0 - Asymptomatic  Vitals:   04/18/18 1300 04/18/18 1309  BP: (!) 149/87   Pulse: 89   Resp: 20   Temp: 97.6 F (36.4 C)   SpO2:  95%   Filed Weights   04/18/18 1309  Weight: 246 lb (111.6 kg)    Physical Exam  Constitutional: She is oriented to person, place, and time and well-developed, well-nourished, and in no distress.  She is in a wheelchair.  She is alone.  HENT:  Head: Normocephalic and atraumatic.  Mouth/Throat: Oropharynx is clear and moist. No oropharyngeal exudate.  Eyes: Pupils are equal, round, and reactive to light.  Neck: Normal range of motion. Neck supple.  Cardiovascular: Normal rate and regular rhythm.  Pulmonary/Chest: No respiratory distress. She has no wheezes.  Decreased bilateral air entry left more than right.  Abdominal: Soft. Bowel sounds are normal. She exhibits no distension and no mass. There is no tenderness. There is no rebound and no guarding.  Musculoskeletal: Normal range of motion. She exhibits no edema or tenderness.  Neurological: She is alert and oriented to person, place, and time.  Skin: Skin is warm.  Psychiatric: Affect normal.     LABORATORY DATA:  I have reviewed the data as listed Lab Results  Component Value Date   WBC 6.9 04/18/2018   HGB 13.9 04/18/2018   HCT 40.8 04/18/2018   MCV 92.9 04/18/2018   PLT 248 04/18/2018   Recent Labs    02/14/18 1512  04/05/18 0951 04/12/18 1712 04/14/18 0435 04/18/18 1531  NA 141 142  --  140 141  K 4.5 4.6  --  4.2 4.5  CL 103 107  --  105 104  CO2 23 29  --  27 27  GLUCOSE 102* 104*  --  99 99  BUN 19 15  --  17 17  CREATININE 0.80 0.68 0.79 0.71 0.76  CALCIUM 10.2 9.6  --  8.6* 9.9  GFRNONAA 76 >60 >60 >60 >60  GFRAA 88 >60 >60 >60 >60  PROT 6.9  --   --   --   --   ALBUMIN 4.4  --   --   --   --   AST 28  --   --   --   --   ALT 41*  --   --   --   --   ALKPHOS 134*  --   --   --   --   BILITOT 0.2  --   --   --   --     RADIOGRAPHIC STUDIES: I have personally reviewed the radiological images as listed and agreed with the findings in the report. Dg Chest 2 View  Addendum Date: 04/18/2018   ADDENDUM REPORT: 04/18/2018 14:14 ADDENDUM: Critical Value/emergent results were called by telephone at the time of interpretation on 04/18/2018 at 1410 hours to Dr. Charlaine Dalton , who verbally acknowledged these results. Electronically Signed   By: Genevie Ann M.D.   On: 04/18/2018 14:14   Result Date: 04/18/2018 CLINICAL DATA:  67 year old female with left side pneumothorax following endoscopic biopsy treated with chest tube placement. Chest tube removed on 04/16/2018, but worsening shortness of breath today. EXAM: CHEST - 2 VIEW COMPARISON:  04/16/2018 and earlier. FINDINGS: Recurrent moderate size left pneumothorax with pleural edge is visible in the left upper lung and laterally. Mildly lower underlying lung volumes. Mediastinal contours remain normal. The right lung remains clear. Visualized tracheal air column is within normal limits. No pleural effusion. No acute osseous abnormality identified. Negative visible bowel gas pattern. IMPRESSION: Recurrent moderate size left pneumothorax. Electronically Signed: By: Genevie Ann M.D. On: 04/18/2018 14:04   Dg Chest Port 1 View  Result Date: 04/18/2018 CLINICAL DATA:  Chest tube placement for pneumothorax. EXAM: PORTABLE CHEST 1 VIEW COMPARISON:  1357 hours on the  same day FINDINGS: Portable AP semi upright view of the chest. New pigtail catheter projects over the medial left mid thorax with re-expansion of the left upper lobe. No apparent pneumothorax on current exam. Subsegmental atelectasis is seen in the left mid lung. Right lung is clear. No mediastinal shift. Heart and mediastinal contours are stable. IMPRESSION: New left-sided chest tube projects over the medial mid left thorax with interval improvement and re-expansion of the left upper lobe. A pneumothorax is currently not identified. Electronically Signed   By: Ashley Royalty M.D.   On: 04/18/2018 18:03   Dg Chest Port 1 View  Result Date: 04/16/2018 CLINICAL DATA:  Status post bronchoscopy 04/12/2018. Status post left chest tube removal today. Shortness of breath. EXAM: PORTABLE CHEST 1 VIEW COMPARISON:  Single-view of the chest 04/15/2018. FINDINGS:  Pigtail catheter has been removed from the left chest. No pneumothorax is identified. The lungs are emphysematous but appear clear. Heart size is normal. No pneumothorax or pleural effusion. IMPRESSION: Left chest tube has been removed. Negative for pneumothorax. No acute disease. Electronically Signed   By: Inge Rise M.D.   On: 04/16/2018 11:02   Dg Chest Port 1 View  Result Date: 04/15/2018 CLINICAL DATA:  Evaluate chest tube EXAM: PORTABLE CHEST 1 VIEW COMPARISON:  Chest radiograph 04/15/2018 FINDINGS: Normal heart size. Bibasilar atelectasis. Left chest tube remains in position. No definite left-sided pneumothorax. IMPRESSION: Left chest tube remains in place. No definite left-sided pneumothorax. Electronically Signed   By: Lovey Newcomer M.D.   On: 04/15/2018 17:36   Dg Chest Port 1 View  Result Date: 04/15/2018 CLINICAL DATA:  Patient status post bronchoscopy 04/12/2018. Left chest tube in place. EXAM: PORTABLE CHEST 1 VIEW COMPARISON:  Single-view of the chest 04/14/2018 and 04/13/2018. CT chest 04/12/2018. FINDINGS: Pigtail catheter remains in place  in the left chest. No pneumothorax. Lungs are clear. Heart size is normal. No pleural effusion. No acute bony abnormality. IMPRESSION: No acute disease. Negative for pneumothorax with a left chest tube in place. Electronically Signed   By: Inge Rise M.D.   On: 04/15/2018 09:09   Dg Chest Port 1 View  Result Date: 04/14/2018 CLINICAL DATA:  Follow-up pneumothorax EXAM: PORTABLE CHEST 1 VIEW COMPARISON:  Yesterday FINDINGS: Normal heart size and mediastinal contours. There is bronchitic airway thickening that is chronic. No visible pneumothorax. Stable left chest tube positioning. Normal heart size. IMPRESSION: No visible pneumothorax. Electronically Signed   By: Monte Fantasia M.D.   On: 04/14/2018 08:36   Dg Chest Port 1 View  Result Date: 04/13/2018 CLINICAL DATA:  Left-sided chest tube placement. EXAM: PORTABLE CHEST 1 VIEW COMPARISON:  One-view chest x-ray of the same day at 6 o'clock a.m. FINDINGS: Heart size is normal. A left-sided pigtail drainage catheter has been placed in the interval. The left lung is re-expanded. No significant residual pneumothorax is present. Left basilar airspace disease likely reflects atelectasis. IMPRESSION: 1. Re-expansion of the left lung following placement of a pigtail all purpose drainage catheter. Electronically Signed   By: San Morelle M.D.   On: 04/13/2018 15:38   Portable Chest 1 View  Result Date: 04/13/2018 CLINICAL DATA:  67 y/o  F; pneumothorax. EXAM: PORTABLE CHEST 1 VIEW COMPARISON:  04/12/2018 chest radiograph. FINDINGS: Stable cardiac silhouette. No rightward mediastinal shift. Mild interval progression of the left-sided pneumothorax. Increasing atelectasis in the left lung. Nodular opacities projecting over the left lower lung zone again noted. No acute osseous abnormality identified. IMPRESSION: Mild interval progression of left-sided pneumothorax. No mediastinal shift. These results will be called to the ordering clinician or  representative by the Radiologist Assistant, and communication documented in the PACS or zVision Dashboard. Electronically Signed   By: Kristine Garbe M.D.   On: 04/13/2018 06:28   Dg Chest Port 1 View  Result Date: 04/12/2018 CLINICAL DATA:  Status post bronchoscopy. EXAM: PORTABLE CHEST 1 VIEW COMPARISON:  Prior CTs and radiographs FINDINGS: The cardiomediastinal silhouette is unremarkable. A small LEFT apical pneumothorax is noted, approximately 15%. Nodular opacity overlying the mid-lower LEFT lung again noted. There is no evidence of pleural effusion. No other changes identified. IMPRESSION: Small LEFT apical pneumothorax, approximately 15%. Critical Value/emergent results were called by telephone at the time of interpretation on 04/12/2018 at 2:59 pm to [facility], nurse for this patient, who verbally acknowledged these results.  Electronically Signed   By: Margarette Canada M.D.   On: 04/12/2018 14:59   Dg C-arm 1-60 Min-no Report  Result Date: 04/12/2018 Fluoroscopy was utilized by the requesting physician.  No radiographic interpretation.   Ct Image Guided Fluid Drain By Catheter  Result Date: 04/13/2018 INDICATION: Enlarging symptomatic left-sided pneumothorax following endoscopic biopsy. EXAM: CT-GUIDED LEFT-SIDED CHEST TUBE PLACEMENT COMPARISON:  Chest radiograph-earlier same day; 04/12/2018 MEDICATIONS: The patient is currently admitted to the hospital and receiving intravenous antibiotics. The antibiotics were administered within an appropriate time frame prior to the initiation of the procedure. ANESTHESIA/SEDATION: Moderate (conscious) sedation was employed during this procedure. A total of Versed 150 mg and Fentanyl 3 mcg was administered intravenously. Moderate Sedation Time: 24 minutes. The patient's level of consciousness and vital signs were monitored continuously by radiology nursing throughout the procedure under my direct supervision. CONTRAST:  None COMPLICATIONS: None immediate.  PROCEDURE: Informed written consent was obtained from the patient after a discussion of the risks, benefits and alternatives to treatment. The patient was placed supine on the CT gantry and a pre procedural CT was performed re-demonstrating the large left-sided pneumothorax. The procedure was planned. A timeout was performed prior to the initiation of the procedure. The skin overlying the anterior aspect the left upper chest was prepped and draped in the usual sterile fashion. The overlying soft tissues were anesthetized with 1% lidocaine with epinephrine. Appropriate trajectory was planned with the use of a 22 gauge spinal needle. An 18 gauge trocar needle was advanced into the left pleural space and a short Amplatz super stiff wire was coiled within the apex of the left pleural space. Appropriate positioning was confirmed with a limited CT scan. The tract was serially dilated allowing placement of a 10 [place] all-purpose drainage catheter. Appropriate positioning was confirmed with a limited postprocedural CT scan. The drainage catheter was connected to a pleura vac and sutured in place. Postprocedural imaging demonstrated near complete evacuation of the left-sided pneumothorax. A dressing was placed. The patient tolerated the procedure well without immediate post procedural complication. IMPRESSION: Successful CT guided placement of a 10 French all purpose drain catheter into the apical aspect of the left pleural space for enlarging symptomatic left-sided pneumothorax. Electronically Signed   By: Sandi Mariscal M.D.   On: 04/13/2018 15:02   Ct Image Guided Drainage By Percutaneous Catheter  Result Date: 04/18/2018 INDICATION: 66 year old with a recurrent symptomatic left pneumothorax. The initial pneumothorax developed following a bronchoscopic lung biopsy. EXAM: CT-GUIDED PLACEMENT OF LEFT CHEST TUBE MEDICATIONS: No antibiotics. ANESTHESIA/SEDATION: 3.5 mg IV Versed 125 mcg IV Fentanyl Moderate Sedation Time:   25 minutes The patient was continuously monitored during the procedure by the interventional radiology nurse under my direct supervision. COMPLICATIONS: None immediate. TECHNIQUE: Informed written consent was obtained from the patient after a thorough discussion of the procedural risks, benefits and alternatives. All questions were addressed. Maximal Sterile Barrier Technique was utilized including caps, mask, sterile gowns, sterile gloves, sterile drape, hand hygiene and skin antiseptic. A timeout was performed prior to the initiation of the procedure. PROCEDURE: Patient was placed supine on CT scanner with the left arm elevated. CT images through the chest were obtained. The left lateral chest was targeted for tube placement. The left mid axillary region was prepped and draped in sterile fashion. Skin and soft tissues were anesthetized with 1% lidocaine. [place] catheter was directed in the pleural space while aspirating. A stiff Amplatz wire was placed. CT images confirmed wire placement. The tract  was dilated to accommodate a 14 [place] multipurpose drain. The drain was easily advanced over the wire. The drain was attached to a PleurEvac with suction. Follow up CT images demonstrated a re-expanded left lung. The catheter was sutured to skin. FINDINGS: Large left pneumothorax. Chest tube placed from a left lateral approach. Left lung was re-expanded at the end of the procedure. IMPRESSION: Successful placement of a CT-guided left chest tube. Electronically Signed   By: Markus Daft M.D.   On: 04/18/2018 17:36   Ct Super D Chest Wo Contrast  Result Date: 04/12/2018 CLINICAL DATA:  Super D chest for biopsy planning. EXAM: CT CHEST WITHOUT CONTRAST TECHNIQUE: Multidetector CT imaging of the chest was performed using thin slice collimation for electromagnetic bronchoscopy planning purposes, without intravenous contrast. COMPARISON:  PET-CT 03/06/2018 and chest CT 12/06/2017. FINDINGS: Cardiovascular: The heart  is normal in size. No pericardial effusion. Stable mild tortuosity and calcification of the thoracic aorta. Stable scattered coronary artery calcifications. Mediastinum/Nodes: No mediastinal or hilar mass or lymphadenopathy. The esophagus is grossly normal. Lungs/Pleura: Stable 18 mm ground-glass nodule in the left lower lobe on image number 29. No other pulmonary lesions are identified. No acute pulmonary findings. Scattered bullous changes. Upper Abdomen: No significant upper abdominal findings. Status post cholecystectomy without biliary dilatation. No hepatic or adrenal gland lesions are identified. Musculoskeletal: No worrisome breast lesions. Evidence of prior left breast surgery and possible radiation. No supraclavicular or axillary adenopathy. No significant bony findings. IMPRESSION: 1. Stable 18 mm ground-glass nodule in the left lower lobe. 2. No enlarged mediastinal or hilar lymph nodes. 3. Evidence of previous left breast surgery and possible radiation but no recurrent breast mass or left axillary adenopathy. Aortic Atherosclerosis (ICD10-I70.0) and Emphysema (ICD10-J43.9). Electronically Signed   By: Marijo Sanes M.D.   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  Highly suspicious for slow-growing adenocarcinoma. S/p Bronch- atypical cells on cytology; no obvious malignancy noted however. Await eval with Dr.Crystal for possible role for radiation given high clinical suspicion for malignancy.  #Shortness of breath worsened-especially given the recent pneumothorax; I would recommend STAT CXR  # COPD-moderate to severe- stable/see above.  #Follow-up to be decided  Addendum: Chest x-ray done showed moderate-sized left pneumothorax; patient recommended to go to the emergency room.  Discussed with Dr.  Genevive Bi; is to evaluate the patient in the floor.  Discussed with ER physician-plan is to placement/or IR.  #I independently reviewed the imaging-which shows moderate-sized left-sided pneumothorax.  Plan as above.    All questions were answered. The patient knows to call the clinic with any problems, questions or concerns.    Cammie Sickle, MD 04/18/2018 8:23 PM

## 2018-04-18 NOTE — ED Notes (Signed)
Per [facility] rep, Dr Rogue Bussing has already consulted with Dr. Faith Rogue.

## 2018-04-18 NOTE — Telephone Encounter (Signed)
Transition Care Management Follow-up Telephone Call  Date of discharge and from where: [facility] on 04/16/18.  How have you been since you were released from the hospital? Doing ok, still winded and having SOB. Pt feels weak and tired. No improvement since hospital d/c. Cough is better today. Coughed up a little blood yesterday. Denies, fever, dizziness or n/v/d. Pt did have some chest pain last night but took Tums and a zantac and it eased off.   Any questions or concerns? Yes, why is breathing so labored?  Items Reviewed:  Did the pt receive and understand the discharge instructions provided? Yes   Medications obtained and verified? Yes   Any new allergies since your discharge? No   Dietary orders reviewed? Yes  Do you have support at home? Only when needed.  Other (ie: DME, Home Health, etc) No  Functional Questionnaire: (I = Independent and D = Dependent) ADL's: I  Bathing/Dressing- I   Meal Prep- I  Eating- I  Maintaining continence- I  Transferring/Ambulation- I  Managing Meds- I   Follow up appointments reviewed:    PCP Hospital f/u appt confirmed? Yes  Scheduled to see Grace Bushy on 04/23/18 @ 3:00 PM.  [facility] f/u appt confirmed? Yes  Scheduled to see the oncologist and radiologist today.  Are transportation arrangements needed? No   If their condition worsens, is the pt aware to call  their PCP or go to the ED? Yes  Was the patient provided with contact information for the PCP's office or ED? Yes  Was the pt encouraged to call back with questions or concerns? Yes

## 2018-04-18 NOTE — Procedures (Signed)
  Pre-operative Diagnosis: Recurrent left pneumothorax      Post-operative Diagnosis: Recurrent left pneumothorax   Indications: Symptomatic recurrent left pneumothorax.  Needs chest tube placement  Procedure: CT-guided placement of left chest tube.  Findings: Large left pneumothorax.  Left lateral approach was selected.  14 French pigtail drain was placed.  Lung re-expanded following chest tube placement.  Complications: None     EBL: Minimal  Plan: Admit to hospital.  Cardiothoracic surgery is going to help manage chest tube and pneumothorax.

## 2018-04-19 ENCOUNTER — Inpatient Hospital Stay: Payer: Medicare Other

## 2018-04-19 DIAGNOSIS — J939 Pneumothorax, unspecified: Secondary | ICD-10-CM

## 2018-04-19 LAB — BASIC METABOLIC PANEL
Anion gap: 7 (ref 5–15)
BUN: 24 mg/dL — ABNORMAL HIGH (ref 8–23)
CHLORIDE: 106 mmol/L (ref 98–111)
CO2: 27 mmol/L (ref 22–32)
CREATININE: 0.82 mg/dL (ref 0.44–1.00)
Calcium: 9.1 mg/dL (ref 8.9–10.3)
GFR calc non Af Amer: >60 mL/min (ref >60)
Glucose, Bld: 109 mg/dL — ABNORMAL HIGH (ref 70–99)
Potassium: 4.1 mmol/L (ref 3.5–5.1)
Sodium: 140 mmol/L (ref 135–145)

## 2018-04-19 LAB — CBC
HEMATOCRIT: 36.7 % (ref 35.0–47.0)
Hemoglobin: 12.5 g/dL (ref 12.0–16.0)
MCH: 32 pg (ref 26.0–34.0)
MCHC: 34.2 g/dL (ref 32.0–36.0)
MCV: 93.4 fL (ref 80.0–100.0)
PLATELETS: 230 10*3/uL (ref 150–440)
RBC: 3.92 MIL/uL (ref 3.80–5.20)
RDW: 13 % (ref 11.5–14.5)
WBC: 6.1 10*3/uL (ref 3.6–11.0)

## 2018-04-19 NOTE — Progress Notes (Signed)
[place] [facility] [place] NAME: Michelle Pitts    MR#:  [id number]  DATE OF BIRTH:  12-11-1950  SUBJECTIVE:  CHIEF COMPLAINT:   Chief Complaint  Patient presents with  . Shortness of Breath   Had a lung nodule biopsy- pneumothorax following that. Chest tube placed by IR, Seen by Thorasic surgery.   REVIEW OF SYSTEMS:  CONSTITUTIONAL: No fever, fatigue or weakness.  EYES: No blurred or double vision.  EARS, NOSE, AND THROAT: No tinnitus or ear pain.  RESPIRATORY: No cough, shortness of breath, wheezing or hemoptysis.  CARDIOVASCULAR: No chest pain, orthopnea, edema.  GASTROINTESTINAL: No nausea, vomiting, diarrhea or abdominal pain.  GENITOURINARY: No dysuria, hematuria.  ENDOCRINE: No polyuria, nocturia,  HEMATOLOGY: No anemia, easy bruising or bleeding SKIN: No rash or lesion. MUSCULOSKELETAL: No joint pain or arthritis.   NEUROLOGIC: No tingling, numbness, weakness.  PSYCHIATRY: No anxiety or depression.   ROS  DRUG ALLERGIES:   Allergies  Allergen Reactions  . Codeine Nausea And Vomiting  . Hydrochlorothiazide Swelling    Swelling of tongue.  [place] [Levofloxacin In D5w] Hives and Swelling    Leg swelling     VITALS:  Blood pressure (!) 146/69, pulse 74, temperature 98.5 F (36.9 C), temperature source Oral, resp. rate 20, height 5\' 4"  (1.626 m), weight 112.3 kg, SpO2 100 %.  PHYSICAL EXAMINATION:  GENERAL:  67 y.o.-year-old patient lying in the bed with no acute distress.  EYES: Pupils equal, round, reactive to light and accommodation. No scleral icterus. Extraocular muscles intact.  HEENT: Head atraumatic, normocephalic. Oropharynx and nasopharynx clear.  NECK:  Supple, no jugular venous distention. No thyroid enlargement, no tenderness.  LUNGS: Normal breath sounds bilaterally, no wheezing, rales,rhonchi or crepitation. No use of accessory muscles of respiration. Chest tube in place. CARDIOVASCULAR: S1, S2 normal.  No murmurs, rubs, or gallops.  ABDOMEN: Soft, nontender, nondistended. Bowel sounds present. No organomegaly or mass.  EXTREMITIES: No pedal edema, cyanosis, or clubbing.  NEUROLOGIC: Cranial nerves II through XII are intact. Muscle strength 5/5 in all extremities. Sensation intact. Gait not checked.  PSYCHIATRIC: The patient is alert and oriented x 3.  SKIN: No obvious rash, lesion, or ulcer.   Physical Exam LABORATORY PANEL:   CBC Recent Labs  Lab 04/19/18 0337  WBC 6.1  HGB 12.5  HCT 36.7  PLT 230   ------------------------------------------------------------------------------------------------------------------  Chemistries  Recent Labs  Lab 04/19/18 0337  NA 140  K 4.1  CL 106  CO2 27  GLUCOSE 109*  BUN 24*  CREATININE 0.82  CALCIUM 9.1   ------------------------------------------------------------------------------------------------------------------  Cardiac Enzymes No results for input(s): TROPONINI in the last 168 hours. ------------------------------------------------------------------------------------------------------------------  RADIOLOGY:  Dg Chest 2 View  Addendum Date: 04/18/2018   ADDENDUM REPORT: 04/18/2018 14:14 ADDENDUM: Critical Value/emergent results were called by telephone at the time of interpretation on 04/18/2018 at 1410 hours to Dr. Charlaine Dalton , who verbally acknowledged these results. Electronically Signed   By: Genevie Ann M.D.   On: 04/18/2018 14:14   Result Date: 04/18/2018 CLINICAL DATA:  67 year old female with left side pneumothorax following endoscopic biopsy treated with chest tube placement. Chest tube removed on 04/16/2018, but worsening shortness of breath today. EXAM: CHEST - 2 VIEW COMPARISON:  04/16/2018 and earlier. FINDINGS: Recurrent moderate size left pneumothorax with pleural edge is visible in the left upper lung and laterally. Mildly lower underlying lung volumes. Mediastinal contours remain normal. The right lung  remains clear. Visualized tracheal air column  is within normal limits. No pleural effusion. No acute osseous abnormality identified. Negative visible bowel gas pattern. IMPRESSION: Recurrent moderate size left pneumothorax. Electronically Signed: By: Genevie Ann M.D. On: 04/18/2018 14:04   Dg Chest Port 1 View  Result Date: 04/19/2018 CLINICAL DATA:  Malignant neoplasm right lower lobe. EXAM: PORTABLE CHEST 1 VIEW COMPARISON:  01/16/2018. FINDINGS: Left chest tube noted in stable position. Tiny left pneumothorax noted on today's exam. No focal alveolar infiltrate. Mild left base subsegmental atelectasis. Heart size normal. IMPRESSION: Left chest tube again noted over the left chest in stable position. Tiny left pneumothorax noted on today's exam. Critical Value/emergent results were called by telephone at the time of interpretation on 04/19/2018 at 7:28 am to nurse Barnabas Lister, who verbally acknowledged these results. Electronically Signed   By: Marcello Moores  Register   On: 04/19/2018 07:32   Dg Chest Port 1 View  Result Date: 04/18/2018 CLINICAL DATA:  Chest tube placement for pneumothorax. EXAM: PORTABLE CHEST 1 VIEW COMPARISON:  1357 hours on the same day FINDINGS: Portable AP semi upright view of the chest. New pigtail catheter projects over the medial left mid thorax with re-expansion of the left upper lobe. No apparent pneumothorax on current exam. Subsegmental atelectasis is seen in the left mid lung. Right lung is clear. No mediastinal shift. Heart and mediastinal contours are stable. IMPRESSION: New left-sided chest tube projects over the medial mid left thorax with interval improvement and re-expansion of the left upper lobe. A pneumothorax is currently not identified. Electronically Signed   By: Ashley Royalty M.D.   On: 04/18/2018 18:03   Ct Image Guided Drainage By Percutaneous Catheter  Result Date: 04/18/2018 INDICATION: 67 year old with a recurrent symptomatic left pneumothorax. The initial pneumothorax developed  following a bronchoscopic lung biopsy. EXAM: CT-GUIDED PLACEMENT OF LEFT CHEST TUBE MEDICATIONS: No antibiotics. ANESTHESIA/SEDATION: 3.5 mg IV Versed 125 mcg IV Fentanyl Moderate Sedation Time:  25 minutes The patient was continuously monitored during the procedure by the interventional radiology nurse under my direct supervision. COMPLICATIONS: None immediate. TECHNIQUE: Informed written consent was obtained from the patient after a thorough discussion of the procedural risks, benefits and alternatives. All questions were addressed. Maximal Sterile Barrier Technique was utilized including caps, mask, sterile gowns, sterile gloves, sterile drape, hand hygiene and skin antiseptic. A timeout was performed prior to the initiation of the procedure. PROCEDURE: Patient was placed supine on CT scanner with the left arm elevated. CT images through the chest were obtained. The left lateral chest was targeted for tube placement. The left mid axillary region was prepped and draped in sterile fashion. Skin and soft tissues were anesthetized with 1% lidocaine. [place] catheter was directed in the pleural space while aspirating. A stiff Amplatz wire was placed. CT images confirmed wire placement. The tract was dilated to accommodate a 14 [place] multipurpose drain. The drain was easily advanced over the wire. The drain was attached to a PleurEvac with suction. Follow up CT images demonstrated a re-expanded left lung. The catheter was sutured to skin. FINDINGS: Large left pneumothorax. Chest tube placed from a left lateral approach. Left lung was re-expanded at the end of the procedure. IMPRESSION: Successful placement of a CT-guided left chest tube. Electronically Signed   By: Markus Daft M.D.   On: 04/18/2018 17:36    ASSESSMENT AND PLAN:   Active Problems:   Pneumothorax  67 year old female with past medical history of COPD, breast cancer, hypothyroidism, hypertension, recent admission for pneumothorax post  bronchoscopy who returns  back to the hospital today due to shortness of breath and chest tightness and noted to have a moderate to large left-sided pneumothorax.  1.  Recurrent left-sided pneumothorax- patient was recently hospitalized last week for similar reasons and had a chest tube placed which was removed this past Sunday with resolving pneumothorax and she was discharged home in stable condition.  She now returns back with worsening shortness of breath and chest tightness and noted to have another pneumothorax. - status post IR guided chest tube/pigtail catheter placement. - consult CT surgery, Dr. Genevive Bi for further management of her chest tube.  Continue oxygen supplementation.  2.  Lung nodule- patient had a worrisome lung nodule which has been increasing in size which is why she underwent bronchoscopy.  She is supposed to start radiation treatment but that is currently on hold given her complications post bronchoscopy pneumothorax.  Continue follow-up with oncology as an outpatient.  3.  Anxiety-continue Xanax.  4.  COPD-no acute exacerbation-continue Dulera.  5.  Essential hypertension-continue Toprol.  6. GERD - cont. Pepcid.    All the records are reviewed and case discussed with Care Management/Social Workerr. Management plans discussed with the patient, family and they are in agreement.  CODE STATUS: Full.  TOTAL TIME TAKING CARE OF THIS PATIENT: 35 minutes.     POSSIBLE D/C IN 1-2 DAYS, DEPENDING ON CLINICAL CONDITION.   Vaughan Basta M.D on 04/19/2018   Between 7am to 6pm - Pager - [phone]  After 6pm go to [email or web address] - password [email or web address] [facility]  Office  [phone]  CC: Primary care physician; Mar Daring, PA-C  Note: This dictation was prepared with Dragon dictation along with smaller phrase technology. Any transcriptional errors that result from this process are unintentional.

## 2018-04-19 NOTE — Care Management (Signed)
Dr. Doy Hutching Physician advisory notified of re admit

## 2018-04-19 NOTE — Progress Notes (Signed)
Patient ID: Michelle Pitts, female   DOB: 1951-05-29, 67 y.o.   MRN: [id number]  Patient ID: Michelle Pitts, female   DOB: [birth year], 67 y.o.   MRN: [id number]  HISTORY: No particular problems today.  She did have some shortness of breath last evening and states that she feels a little bit short of breath this morning as well.  In addition she states that there is been a lot of bubbling from her chest tube and indeed on exam the tube has become disconnected despite tape holding the joint together.  I resecured it and the air leak has stopped.   Vitals:   04/19/18 1059 04/19/18 1323  BP: (!) 152/77 129/62  Pulse: 83 74  Resp:  16  Temp:  98.1 F (36.7 C)  SpO2:  100%     EXAM:    Resp: Lungs are clear bilaterally.  No respiratory distress, normal effort. Heart:  Regular without murmurs Abd:  Abdomen is soft, non distended and non tender. No masses are palpable.  There is no rebound and no guarding.  Neurological: Alert and oriented to person, place, and time. Coordination normal.  Skin: Skin is warm and dry. No rash noted. No diaphoretic. No erythema. No pallor.  Psychiatric: Normal mood and affect. Normal behavior. Judgment and thought content normal.   Independent review of her chest x-ray from this morning shows a small left apical pneumothorax.  I connected the tubing again and the air leak went away.  ASSESSMENT: Iatrogenic pneumothorax following lung biopsy   PLAN:   I would recommend that we keep her chest tube to suction today.  I will reassess in the morning.  I will also ordered a chest x-ray for tomorrow morning as well.    Nestor Lewandowsky, MD

## 2018-04-20 ENCOUNTER — Inpatient Hospital Stay: Payer: Medicare Other

## 2018-04-20 LAB — HIV ANTIBODY (ROUTINE TESTING W REFLEX): HIV Screen 4th Generation wRfx: NONREACTIVE

## 2018-04-20 NOTE — Care Management Important Message (Signed)
Important Message  Patient Details  Name: Michelle Pitts MRN: [id number] Date of Birth: [birth year]   Medicare Important Message Given:  Yes    Juliann Pulse A Innocence Schlotzhauer 04/20/2018, 12:19 PM

## 2018-04-20 NOTE — Progress Notes (Signed)
  Patient ID: Michelle Pitts, female   DOB: Feb 23, 1951, 67 y.o.   MRN: [id number]  HISTORY: She states that she feels better today.  She does not complain of any shortness of breath or chest pain.  She has not heard any bubbling from her tube.   Vitals:   04/20/18 1159 04/20/18 1240  BP: 140/78   Pulse: 77   Resp: 19   Temp: 98.4 F (36.9 C)   SpO2: 98% 99%     EXAM:    Resp: Lungs are clear bilaterally.  No respiratory distress, normal effort. Heart:  Regular without murmurs Abd:  Abdomen is soft, non distended and non tender. No masses are palpable.  There is no rebound and no guarding.  Neurological: Alert and oriented to person, place, and time. Coordination normal.  Skin: Skin is warm and dry. No rash noted. No diaphoretic. No erythema. No pallor.  Psychiatric: Normal mood and affect. Normal behavior. Judgment and thought content normal.   She did have a chest x-ray made today which I independently reviewed.  There is no pneumothorax on that.  There is no air leak.  ASSESSMENT: Iatrogenic left-sided pneumothorax following lung biopsy   PLAN:   I will place the chest tube to waterseal.  We will obtain a chest x-ray tomorrow morning.  Dr. [name] will cover over the weekend and he is aware of the patient.    Nestor Lewandowsky, MDPatient ID: Michelle Pitts, female   DOB: September 26, 1950, 67 y.o.   MRN: [id number]

## 2018-04-20 NOTE — Progress Notes (Signed)
[facility] [place] NAME: Michelle Pitts    MR#:  [id number]  DATE OF BIRTH:  06/29/1951  SUBJECTIVE:  CHIEF COMPLAINT:   Chief Complaint  Patient presents with  . Shortness of Breath   Had a lung nodule biopsy- pneumothorax following that. Chest tube placed by IR, Seen by Thorasic surgery.   No pneumothorax now on Xray.  REVIEW OF SYSTEMS:  CONSTITUTIONAL: No fever, fatigue or weakness.  EYES: No blurred or double vision.  EARS, NOSE, AND THROAT: No tinnitus or ear pain.  RESPIRATORY: No cough, shortness of breath, wheezing or hemoptysis.  CARDIOVASCULAR: No chest pain, orthopnea, edema.  GASTROINTESTINAL: No nausea, vomiting, diarrhea or abdominal pain.  GENITOURINARY: No dysuria, hematuria.  ENDOCRINE: No polyuria, nocturia,  HEMATOLOGY: No anemia, easy bruising or bleeding SKIN: No rash or lesion. MUSCULOSKELETAL: No joint pain or arthritis.   NEUROLOGIC: No tingling, numbness, weakness.  PSYCHIATRY: No anxiety or depression.   ROS  DRUG ALLERGIES:   Allergies  Allergen Reactions  . Codeine Nausea And Vomiting  . Hydrochlorothiazide Swelling    Swelling of tongue.  Mack Hook [Levofloxacin In D5w] Hives and Swelling    Leg swelling     VITALS:  Blood pressure 140/78, pulse 77, temperature 98.4 F (36.9 C), temperature source Oral, resp. rate 19, height 5\' 4"  (1.626 m), weight 112.3 kg, SpO2 99 %.  PHYSICAL EXAMINATION:  GENERAL:  67 y.o.-year-old patient lying in the bed with no acute distress.  EYES: Pupils equal, round, reactive to light and accommodation. No scleral icterus. Extraocular muscles intact.  HEENT: Head atraumatic, normocephalic. Oropharynx and nasopharynx clear.  NECK:  Supple, no jugular venous distention. No thyroid enlargement, no tenderness.  LUNGS: Normal breath sounds bilaterally, no wheezing, rales,rhonchi or crepitation. No use of accessory muscles of respiration. Chest tube in  place. CARDIOVASCULAR: S1, S2 normal. No murmurs, rubs, or gallops.  ABDOMEN: Soft, nontender, nondistended. Bowel sounds present. No organomegaly or mass.  EXTREMITIES: No pedal edema, cyanosis, or clubbing.  NEUROLOGIC: Cranial nerves II through XII are intact. Muscle strength 5/5 in all extremities. Sensation intact. Gait not checked.  PSYCHIATRIC: The patient is alert and oriented x 3.  SKIN: No obvious rash, lesion, or ulcer.   Physical Exam LABORATORY PANEL:   CBC Recent Labs  Lab 04/19/18 0337  WBC 6.1  HGB 12.5  HCT 36.7  PLT 230   ------------------------------------------------------------------------------------------------------------------  Chemistries  Recent Labs  Lab 04/19/18 0337  NA 140  K 4.1  CL 106  CO2 27  GLUCOSE 109*  BUN 24*  CREATININE 0.82  CALCIUM 9.1   ------------------------------------------------------------------------------------------------------------------  Cardiac Enzymes No results for input(s): TROPONINI in the last 168 hours. ------------------------------------------------------------------------------------------------------------------  RADIOLOGY:  Dg Chest 2 View  Addendum Date: 04/18/2018   ADDENDUM REPORT: 04/18/2018 14:14 ADDENDUM: Critical Value/emergent results were called by telephone at the time of interpretation on 04/18/2018 at 1410 hours to Dr. Charlaine Dalton , who verbally acknowledged these results. Electronically Signed   By: Genevie Ann M.D.   On: 04/18/2018 14:14   Result Date: 04/18/2018 CLINICAL DATA:  67 year old female with left side pneumothorax following endoscopic biopsy treated with chest tube placement. Chest tube removed on 04/16/2018, but worsening shortness of breath today. EXAM: CHEST - 2 VIEW COMPARISON:  04/16/2018 and earlier. FINDINGS: Recurrent moderate size left pneumothorax with pleural edge is visible in the left upper lung and laterally. Mildly lower underlying lung volumes. Mediastinal  contours remain normal. The right lung remains  clear. Visualized tracheal air column is within normal limits. No pleural effusion. No acute osseous abnormality identified. Negative visible bowel gas pattern. IMPRESSION: Recurrent moderate size left pneumothorax. Electronically Signed: By: Genevie Ann M.D. On: 04/18/2018 14:04   Dg Chest Port 1 View  Result Date: 04/20/2018 CLINICAL DATA:  Chest tube placement for pneumothorax. History of lung carcinoma EXAM: PORTABLE CHEST 1 VIEW COMPARISON:  April 19, 2018 FINDINGS: Chest tube on the left is unchanged in position. Currently no pneumothorax is evident. There is no edema or consolidation. The heart size and pulmonary vascularity are normal. No adenopathy. No evident bone lesions. IMPRESSION: Unchanged chest tube position on the left without evident pneumothorax. No edema or consolidation. Stable cardiac silhouette. Electronically Signed   By: Lowella Grip III M.D.   On: 04/20/2018 07:25   Dg Chest Port 1 View  Result Date: 04/19/2018 CLINICAL DATA:  Malignant neoplasm right lower lobe. EXAM: PORTABLE CHEST 1 VIEW COMPARISON:  01/16/2018. FINDINGS: Left chest tube noted in stable position. Tiny left pneumothorax noted on today's exam. No focal alveolar infiltrate. Mild left base subsegmental atelectasis. Heart size normal. IMPRESSION: Left chest tube again noted over the left chest in stable position. Tiny left pneumothorax noted on today's exam. Critical Value/emergent results were called by telephone at the time of interpretation on 04/19/2018 at 7:28 am to nurse Barnabas Lister, who verbally acknowledged these results. Electronically Signed   By: Marcello Moores  Register   On: 04/19/2018 07:32   Dg Chest Port 1 View  Result Date: 04/18/2018 CLINICAL DATA:  Chest tube placement for pneumothorax. EXAM: PORTABLE CHEST 1 VIEW COMPARISON:  1357 hours on the same day FINDINGS: Portable AP semi upright view of the chest. New pigtail catheter projects over the medial left mid  thorax with re-expansion of the left upper lobe. No apparent pneumothorax on current exam. Subsegmental atelectasis is seen in the left mid lung. Right lung is clear. No mediastinal shift. Heart and mediastinal contours are stable. IMPRESSION: New left-sided chest tube projects over the medial mid left thorax with interval improvement and re-expansion of the left upper lobe. A pneumothorax is currently not identified. Electronically Signed   By: Ashley Royalty M.D.   On: 04/18/2018 18:03   Ct Image Guided Drainage By Percutaneous Catheter  Result Date: 04/18/2018 INDICATION: 67 year old with a recurrent symptomatic left pneumothorax. The initial pneumothorax developed following a bronchoscopic lung biopsy. EXAM: CT-GUIDED PLACEMENT OF LEFT CHEST TUBE MEDICATIONS: No antibiotics. ANESTHESIA/SEDATION: 3.5 mg IV Versed 125 mcg IV Fentanyl Moderate Sedation Time:  25 minutes The patient was continuously monitored during the procedure by the interventional radiology nurse under my direct supervision. COMPLICATIONS: None immediate. TECHNIQUE: Informed written consent was obtained from the patient after a thorough discussion of the procedural risks, benefits and alternatives. All questions were addressed. Maximal Sterile Barrier Technique was utilized including caps, mask, sterile gowns, sterile gloves, sterile drape, hand hygiene and skin antiseptic. A timeout was performed prior to the initiation of the procedure. PROCEDURE: Patient was placed supine on CT scanner with the left arm elevated. CT images through the chest were obtained. The left lateral chest was targeted for tube placement. The left mid axillary region was prepped and draped in sterile fashion. Skin and soft tissues were anesthetized with 1% lidocaine. [place] catheter was directed in the pleural space while aspirating. A stiff Amplatz wire was placed. CT images confirmed wire placement. The tract was dilated to accommodate a 14 [place] multipurpose  drain. The drain was easily advanced  over the wire. The drain was attached to a PleurEvac with suction. Follow up CT images demonstrated a re-expanded left lung. The catheter was sutured to skin. FINDINGS: Large left pneumothorax. Chest tube placed from a left lateral approach. Left lung was re-expanded at the end of the procedure. IMPRESSION: Successful placement of a CT-guided left chest tube. Electronically Signed   By: Markus Daft M.D.   On: 04/18/2018 17:36    ASSESSMENT AND PLAN:   Active Problems:   Pneumothorax  67 year old female with past medical history of COPD, breast cancer, hypothyroidism, hypertension, recent admission for pneumothorax post bronchoscopy who returns back to the hospital today due to shortness of breath and chest tightness and noted to have a moderate to large left-sided pneumothorax.  1.  Recurrent left-sided pneumothorax- patient was recently hospitalized last week for similar reasons and had a chest tube placed which was removed this past Sunday with resolving pneumothorax and she was discharged home in stable condition.  She now returns back with worsening shortness of breath and chest tightness and noted to have another pneumothorax. - status post IR guided chest tube/pigtail catheter placement. - consult CT surgery, Dr. Genevive Bi for further management of her chest tube.  Continue oxygen supplementation.  Dr. Rosana Hoes will follow over the weekend, starting the tube on water seal today.  2.  Lung nodule- patient had a worrisome lung nodule which has been increasing in size which is why she underwent bronchoscopy.  She is supposed to start radiation treatment but that is currently on hold given her complications post bronchoscopy pneumothorax.  Continue follow-up with oncology as an outpatient.  3.  Anxiety-continue Xanax.  4.  COPD-no acute exacerbation-continue Dulera.  5.  Essential hypertension-continue Toprol.  6. GERD - cont. Pepcid.    All the records  are reviewed and case discussed with Care Management/Social Workerr. Management plans discussed with the patient, family and they are in agreement.  CODE STATUS: Full.  TOTAL TIME TAKING CARE OF THIS PATIENT: 35 minutes.     POSSIBLE D/C IN 1-2 DAYS, DEPENDING ON CLINICAL CONDITION.   Michelle Pitts M.D on 04/20/2018   Between 7am to 6pm - Pager - [phone]  After 6pm go to [email or web address] - password [email or web address] [facility]  Office  [phone]  CC: Primary care physician; Mar Daring, PA-C  Note: This dictation was prepared with Dragon dictation along with smaller phrase technology. Any transcriptional errors that result from this process are unintentional.

## 2018-04-21 ENCOUNTER — Inpatient Hospital Stay: Payer: Medicare Other

## 2018-04-21 NOTE — Progress Notes (Signed)
SURGICAL PROGRESS NOTE (cpt [phone])  Hospital Day(s): 3.   Post op day(s):  Marland Kitchen   Interval History: Patient seen and examined, no acute events or new complaints overnight. Patient reports she's breathing well with minimal pain and denies SOB, fever/chills, or N/V. She is otherwise tolerating her diet and expresses some anxiety about going home too soon and again experiencing recurrent pneumothorax.  Review of Systems:  Constitutional: denies fever, chills  HEENT: denies cough or congestion  Respiratory: denies any shortness of breath as per HPI Cardiovascular: denies chest pain or palpitations  Gastrointestinal: denies abdominal pain, N/V, or diarrhea Genitourinary: denies burning with urination or urinary frequency Musculoskeletal: denies pain, decreased motor or sensation Integumentary: denies any other rashes or skin discolorations Neurological: denies HA or vision/hearing changes   Vital signs in last 24 hours: [min-max] current  Temp:  [97.3 F (36.3 C)-98.4 F (36.9 C)] 97.3 F (36.3 C) (09/07 0507) Pulse Rate:  [77-79] 78 (09/07 0507) Resp:  [18-20] 20 (09/07 0507) BP: (130-146)/(78-121) 146/121 (09/07 0507) SpO2:  [96 %-100 %] 96 % (09/07 0507)     Height: 5\' 4"  (162.6 cm) Weight: 112.3 kg BMI (Calculated): 42.48   Intake/Output this shift:  Total I/O In: -  Out: 300 [Urine:300]   Intake/Output last 2 shifts:  @IOLAST2SHIFTS @   Physical Exam:  Constitutional: alert, cooperative and no distress  HENT: normocephalic without obvious abnormality  Eyes: PERRL, EOM's grossly intact and symmetric  Neuro: CN II - XII grossly intact and symmetric without deficit  Respiratory: breathing non-labored at rest, Left pleural drain well-secured without air leak, minimal drainage  Cardiovascular: regular rate and sinus rhythm  Gastrointestinal: soft, non-tender, and non-distended Musculoskeletal: UE and LE FROM, motor and sensation grossly intact, NT   Labs:  CBC Latest Ref  Rng & Units 04/19/2018 04/18/2018 04/14/2018  WBC 3.6 - 11.0 K/uL 6.1 6.9 5.8  Hemoglobin 12.0 - 16.0 g/dL 12.5 13.9 10.9(L)  Hematocrit 35.0 - 47.0 % 36.7 40.8 32.2(L)  Platelets 150 - 440 K/uL 230 248 187   CMP Latest Ref Rng & Units 04/19/2018 04/18/2018 04/14/2018  Glucose 70 - 99 mg/dL 109(H) 99 99  BUN 8 - 23 mg/dL 24(H) 17 17  Creatinine 0.44 - 1.00 mg/dL 0.82 0.76 0.71  Sodium 135 - 145 mmol/L 140 141 140  Potassium 3.5 - 5.1 mmol/L 4.1 4.5 4.2  Chloride 98 - 111 mmol/L 106 104 105  CO2 22 - 32 mmol/L 27 27 27   Calcium 8.9 - 10.3 mg/dL 9.1 9.9 8.6(L)  Total Protein 6.0 - 8.5 g/dL - - -  Total Bilirubin 0.0 - 1.2 mg/dL - - -  Alkaline Phos 39 - 117 IU/L - - -  AST 0 - 40 IU/L - - -  ALT 0 - 32 IU/L - - -   Imaging studies:  Chest X-ray (04/21/2018) 1. Left chest tube in stable position. 2. No pneumothorax.  Assessment/Plan: (ICD-10's: J93.9) 67 y.o. female with resolved Left pneumothorax s/p placement of Left pleural drain without air leak today on water seal, complicated by pertinent comorbidities including morbid obesity (BMI >42), HTN, hypercholesterolemia, COPD, OSA, hypothyroidism, former tobacco abuse (smoking), generalized anxiety disorder, and history of breast and vulvar cancers.   - continue Left pleural drain to water seal considering recurrent pneumothorax   - will repeat chest x-ray tomorrow and, if no issues as anticipated, will plan to remove Left pleural drain  - medical management of comorbidities as per primary medical team  - discharge planning, likely  for Monday  All of the above findings and recommendations were discussed with the patient, patient's RN, and the medical team, and all of patient's questions were answered to her expressed satisfaction.  Thank you for the opportunity to participate in this patient's care.  -- Marilynne Drivers Rosana Hoes, MD, [place]: [facility] - Partnering for exceptional care. Office:  [phone]

## 2018-04-21 NOTE — Progress Notes (Signed)
[facility] [place] NAME: Michelle Pitts    MR#:  [id number]  DATE OF BIRTH:  August 19, 1950  SUBJECTIVE:  CHIEF COMPLAINT:   Chief Complaint  Patient presents with  . Shortness of Breath   Patient known to me from recent admission she is currently doing well denies any complaints REVIEW OF SYSTEMS:  CONSTITUTIONAL: No fever, fatigue or weakness.  EYES: No blurred or double vision.  EARS, NOSE, AND THROAT: No tinnitus or ear pain.  RESPIRATORY: No cough, shortness of breath, wheezing or hemoptysis.  CARDIOVASCULAR: No chest pain, orthopnea, edema.  GASTROINTESTINAL: No nausea, vomiting, diarrhea or abdominal pain.  GENITOURINARY: No dysuria, hematuria.  ENDOCRINE: No polyuria, nocturia,  HEMATOLOGY: No anemia, easy bruising or bleeding SKIN: No rash or lesion. MUSCULOSKELETAL: No joint pain or arthritis.   NEUROLOGIC: No tingling, numbness, weakness.  PSYCHIATRY: No anxiety or depression.   ROS  DRUG ALLERGIES:   Allergies  Allergen Reactions  . Codeine Nausea And Vomiting  . Hydrochlorothiazide Swelling    Swelling of tongue.  [place]ack [place] [Levofloxacin In D5w] Hives and Swelling    Leg swelling     VITALS:  Blood pressure 128/78, pulse 73, temperature 98.1 F (36.7 C), temperature source Oral, resp. rate 18, height 5\' 4"  (1.626 m), weight 112.3 kg, SpO2 97 %.  PHYSICAL EXAMINATION:  GENERAL:  67 y.o.-year-old patient lying in the bed with no acute distress.  EYES: Pupils equal, round, reactive to light and accommodation. No scleral icterus. Extraocular muscles intact.  HEENT: Head atraumatic, normocephalic. Oropharynx and nasopharynx clear.  NECK:  Supple, no jugular venous distention. No thyroid enlargement, no tenderness.  LUNGS: Normal breath sounds bilaterally, no wheezing, rales,rhonchi or crepitation. No use of accessory muscles of respiration. Chest tube in place. CARDIOVASCULAR: S1, S2 normal. No murmurs, rubs, or  gallops.  ABDOMEN: Soft, nontender, nondistended. Bowel sounds present. No organomegaly or mass.  EXTREMITIES: No pedal edema, cyanosis, or clubbing.  NEUROLOGIC: Cranial nerves II through XII are intact. Muscle strength 5/5 in all extremities. Sensation intact. Gait not checked.  PSYCHIATRIC: The patient is alert and oriented x 3.  SKIN: No obvious rash, lesion, or ulcer.   Physical Exam LABORATORY PANEL:   CBC Recent Labs  Lab 04/19/18 0337  WBC 6.1  HGB 12.5  HCT 36.7  PLT 230   ------------------------------------------------------------------------------------------------------------------  Chemistries  Recent Labs  Lab 04/19/18 0337  NA 140  K 4.1  CL 106  CO2 27  GLUCOSE 109*  BUN 24*  CREATININE 0.82  CALCIUM 9.1   ------------------------------------------------------------------------------------------------------------------  Cardiac Enzymes No results for input(s): TROPONINI in the last 168 hours. ------------------------------------------------------------------------------------------------------------------  RADIOLOGY:  Dg Chest 2 View  Result Date: 04/21/2018 CLINICAL DATA:  Left-sided chest tube.  Lung cancer. EXAM: CHEST - 2 VIEW COMPARISON:  One-view chest x-ray 04/20/2018 FINDINGS: The heart size is normal. There is no edema or effusion. A left-sided pigtail chest tube is in place. There is no pneumothorax. Known ground-glass nodule in the left lower lobe is again seen. No new nodule or airspace disease is present. The visualized soft tissues and bony thorax are unremarkable. IMPRESSION: 1. Left chest tube in stable position. 2. No pneumothorax. Electronically Signed   By: San Morelle M.D.   On: 04/21/2018 09:24   Dg Chest Port 1 View  Result Date: 04/20/2018 CLINICAL DATA:  Chest tube placement for pneumothorax. History of lung carcinoma EXAM: PORTABLE CHEST 1 VIEW COMPARISON:  April 19, 2018 FINDINGS: Chest tube  on the left is unchanged  in position. Currently no pneumothorax is evident. There is no edema or consolidation. The heart size and pulmonary vascularity are normal. No adenopathy. No evident bone lesions. IMPRESSION: Unchanged chest tube position on the left without evident pneumothorax. No edema or consolidation. Stable cardiac silhouette. Electronically Signed   By: Lowella Grip III M.D.   On: 04/20/2018 07:25    ASSESSMENT AND PLAN:   Active Problems:   Pneumothorax  67 year old female with past medical history of COPD, breast cancer, hypothyroidism, hypertension, recent admission for pneumothorax post bronchoscopy who returns back to the hospital today due to shortness of breath and chest tightness and noted to have a moderate to large left-sided pneumothorax.  1.  Recurrent left-sided pneumothorax- she has chest tube in place plan is to leave in until tomorrow Repeat x-ray in the morning  2.  Lung nodule- patient had a worrisome lung nodule which has been increasing in size which is why she underwent bronchoscopy.  She is supposed to start radiation treatment but that is currently on hold given her complications post bronchoscopy pneumothorax.  Continue follow-up with oncology as an outpatient.  3.  Anxiety-continue Xanax.  4.  COPD-no acute exacerbation-continue Dulera.  5.  Essential hypertension-continue Toprol.  6. GERD - cont. Pepcid.    All the records are reviewed and case discussed with Care Management/Social Workerr. Management plans discussed with the patient, family and they are in agreement.  CODE STATUS: Full.  TOTAL TIME TAKING CARE OF THIS PATIENT: 35 minutes.     POSSIBLE D/C IN 1-2 DAYS, DEPENDING ON CLINICAL CONDITION.   Dustin Flock M.D on 04/21/2018   Between 7am to 6pm - Pager - [phone]  After 6pm go to [email or web address] - password [email or web address] [facility]  Office  [phone]  CC: Primary care physician; Mar Daring, PA-C  Note:  This dictation was prepared with Dragon dictation along with smaller phrase technology. Any transcriptional errors that result from this process are unintentional.

## 2018-04-22 ENCOUNTER — Inpatient Hospital Stay: Payer: Medicare Other

## 2018-04-22 NOTE — Progress Notes (Signed)
SURGICAL PROGRESS NOTE (cpt [phone])  Hospital Day(s): 4.   Post op day(s):  Marland Kitchen   Interval History: Patient seen and examined, no acute events or new complaints overnight. Patient reports breathing well without CP or SOB, denies fever/chills, has been ambulating.  Review of Systems:  Constitutional: denies fever, chills  HEENT: denies cough or congestion  Respiratory: denies any shortness of breath  Cardiovascular: denies chest pain or palpitations  Gastrointestinal: denies abdominal pain, N/V, or diarrhea Genitourinary: denies burning with urination or urinary frequency Musculoskeletal: denies pain, decreased motor or sensation Integumentary: denies any other rashes or skin discolorations except Left pleural drain Neurological: denies HA or vision/hearing changes   Vital signs in last 24 hours: [min-max] current  Temp:  [98.1 F (36.7 C)-98.7 F (37.1 C)] 98.1 F (36.7 C) (09/08 0519) Pulse Rate:  [73-83] 75 (09/08 0519) Resp:  [18] 18 (09/08 0519) BP: (118-132)/(68-97) 118/68 (09/08 0519) SpO2:  [97 %-100 %] 99 % (09/08 0519)     Height: 5\' 4"  (162.6 cm) Weight: 112.3 kg BMI (Calculated): 42.48   Intake/Output this shift:  No intake/output data recorded.   Intake/Output last 2 shifts:  @IOLAST2SHIFTS @   Physical Exam:  Constitutional: alert, cooperative and no distress  HENT: normocephalic without obvious abnormality  Eyes: PERRL, EOM's grossly intact and symmetric  Neuro: CN II - XII grossly intact and symmetric without deficit  Respiratory: breathing non-labored at rest, pleural drain secured without air leak or surrounding erythema Cardiovascular: regular rate and sinus rhythm  Gastrointestinal: soft, non-tender, and non-distended Musculoskeletal: UE and LE FROM, no edema or wounds, motor and sensation grossly intact, NT   Labs:  CBC Latest Ref Rng & Units 04/19/2018 04/18/2018 04/14/2018  WBC 3.6 - 11.0 K/uL 6.1 6.9 5.8  Hemoglobin 12.0 - 16.0 g/dL 12.5 13.9 10.9(L)   Hematocrit 35.0 - 47.0 % 36.7 40.8 32.2(L)  Platelets 150 - 440 K/uL 230 248 187   CMP Latest Ref Rng & Units 04/19/2018 04/18/2018 04/14/2018  Glucose 70 - 99 mg/dL 109(H) 99 99  BUN 8 - 23 mg/dL 24(H) 17 17  Creatinine 0.44 - 1.00 mg/dL 0.82 0.76 0.71  Sodium 135 - 145 mmol/L 140 141 140  Potassium 3.5 - 5.1 mmol/L 4.1 4.5 4.2  Chloride 98 - 111 mmol/L 106 104 105  CO2 22 - 32 mmol/L 27 27 27   Calcium 8.9 - 10.3 mg/dL 9.1 9.9 8.6(L)  Total Protein 6.0 - 8.5 g/dL - - -  Total Bilirubin 0.0 - 1.2 mg/dL - - -  Alkaline Phos 39 - 117 IU/L - - -  AST 0 - 40 IU/L - - -  ALT 0 - 32 IU/L - - -   Imaging studies:  Chest X-ray (04/22/2018) - personally reviewed and discussed with patient, still no radiology interpretation No significant residual or recurrent pneumothorax  Assessment/Plan: (ICD-10's: J93.9) 67 y.o. female with resolved Left pneumothorax s/p placement of Left pleural drain without air leak again today on water seal, complicated by pertinent comorbidities including morbid obesity (BMI >42), HTN, hypercholesterolemia, COPD, OSA, hypothyroidism, former tobacco abuse (smoking), generalized anxiety disorder, and history of breast and vulvar cancers.              - Left pleural drain removed with occlusive dressing applied              - will repeat chest x-ray tomorrow morning and, if no issues as anticipated, discharge planning             -  medical management of comorbidities as per primary medical team             - RN instructed to check stat portable chest x-ray if acute SOB  All of the above findings and recommendations were discussed with the patient, patient's RN, and the medical team, and all of patient's questions were answered to her expressed satisfaction.  Thank you for the opportunity to participate in this patient's care.  -- Marilynne Drivers Rosana Hoes, MD, [place]: [facility] - Partnering for exceptional care. Office: [phone]

## 2018-04-22 NOTE — Progress Notes (Signed)
[facility] NAME: Michelle Pitts    MR#:  [id number]  DATE OF BIRTH:  [birth year]  SUBJECTIVE:  CHIEF COMPLAINT:   Chief Complaint  Patient presents with  . Shortness of Breath   Patient doing better, denies any shortness of breath or chest pain     REVIEW OF SYSTEMS:  CONSTITUTIONAL: No fever, fatigue or weakness.  EYES: No blurred or double vision.  EARS, NOSE, AND THROAT: No tinnitus or ear pain.  RESPIRATORY: No cough, shortness of breath, wheezing or hemoptysis.  CARDIOVASCULAR: No chest pain, orthopnea, edema.  GASTROINTESTINAL: No nausea, vomiting, diarrhea or abdominal pain.  GENITOURINARY: No dysuria, hematuria.  ENDOCRINE: No polyuria, nocturia,  HEMATOLOGY: No anemia, easy bruising or bleeding SKIN: No rash or lesion. MUSCULOSKELETAL: No joint pain or arthritis.   NEUROLOGIC: No tingling, numbness, weakness.  PSYCHIATRY: No anxiety or depression.   ROS  DRUG ALLERGIES:   Allergies  Allergen Reactions  . Codeine Nausea And Vomiting  . Hydrochlorothiazide Swelling    Swelling of tongue.  Mack [place] [Levofloxacin In D5w] Hives and Swelling    Leg swelling     VITALS:  Blood pressure 124/61, pulse 77, temperature 98.4 F (36.9 C), temperature source Oral, resp. rate 18, height 5\' 4"  (1.626 m), weight 112.3 kg, SpO2 99 %.  PHYSICAL EXAMINATION:  GENERAL:  67 y.o.-year-old patient lying in the bed with no acute distress.  EYES: Pupils equal, round, reactive to light and accommodation. No scleral icterus. Extraocular muscles intact.  HEENT: Head atraumatic, normocephalic. Oropharynx and nasopharynx clear.  NECK:  Supple, no jugular venous distention. No thyroid enlargement, no tenderness.  LUNGS: Normal breath sounds bilaterally, no wheezing, rales,rhonchi or crepitation. No use of accessory muscles of respiration. Chest tube in place. CARDIOVASCULAR: S1, S2 normal. No murmurs, rubs, or gallops.  ABDOMEN:  Soft, nontender, nondistended. Bowel sounds present. No organomegaly or mass.  EXTREMITIES: No pedal edema, cyanosis, or clubbing.  NEUROLOGIC: Cranial nerves II through XII are intact. Muscle strength 5/5 in all extremities. Sensation intact. Gait not checked.  PSYCHIATRIC: The patient is alert and oriented x 3.  SKIN: No obvious rash, lesion, or ulcer.   Physical Exam LABORATORY PANEL:   CBC Recent Labs  Lab 04/19/18 0337  WBC 6.1  HGB 12.5  HCT 36.7  PLT 230   ------------------------------------------------------------------------------------------------------------------  Chemistries  Recent Labs  Lab 04/19/18 0337  NA 140  K 4.1  CL 106  CO2 27  GLUCOSE 109*  BUN 24*  CREATININE 0.82  CALCIUM 9.1   ------------------------------------------------------------------------------------------------------------------  Cardiac Enzymes No results for input(s): TROPONINI in the last 168 hours. ------------------------------------------------------------------------------------------------------------------  RADIOLOGY:  Dg Chest 2 View  Result Date: 04/21/2018 CLINICAL DATA:  Left-sided chest tube.  Lung cancer. EXAM: CHEST - 2 VIEW COMPARISON:  One-view chest x-ray 04/20/2018 FINDINGS: The heart size is normal. There is no edema or effusion. A left-sided pigtail chest tube is in place. There is no pneumothorax. Known ground-glass nodule in the left lower lobe is again seen. No new nodule or airspace disease is present. The visualized soft tissues and bony thorax are unremarkable. IMPRESSION: 1. Left chest tube in stable position. 2. No pneumothorax. Electronically Signed   By: San Morelle M.D.   On: 04/21/2018 09:24    ASSESSMENT AND PLAN:   Active Problems:   Pneumothorax  67 year old female with past medical history of COPD, breast cancer, hypothyroidism, hypertension, recent admission for pneumothorax post bronchoscopy who returns back to  the hospital today  due to shortness of breath and chest tightness and noted to have a moderate to large left-sided pneumothorax.  1.  Recurrent left-sided pneumothorax-  Chest tube removal per surgery  2.  Lung nodule- patient had a worrisome lung nodule which has been increasing in size which is why she underwent bronchoscopy.  She is supposed to start radiation treatment but that is currently on hold given her complications post bronchoscopy pneumothorax.  Continue follow-up with oncology as an outpatient.  3.  Anxiety-continue Xanax.  4.  COPD-no acute exacerbation-continue Dulera.  5.  Essential hypertension-continue Toprol.  6. GERD - cont. Pepcid.    All the records are reviewed and case discussed with Care Management/Social Workerr. Management plans discussed with the patient, family and they are in agreement.  CODE STATUS: Full.  TOTAL TIME TAKING CARE OF THIS PATIENT: 25 minutes.     POSSIBLE D/C IN 1-2 DAYS, DEPENDING ON CLINICAL CONDITION.   Dustin Flock M.D on 04/22/2018   Between 7am to 6pm - Pager - [phone]  After 6pm go to [email or web address] - password [email or web address] [facility]  Office  [phone]  CC: Primary care physician; Mar Daring, PA-C  Note: This dictation was prepared with Dragon dictation along with smaller phrase technology. Any transcriptional errors that result from this process are unintentional.

## 2018-04-23 ENCOUNTER — Inpatient Hospital Stay: Payer: Medicare Other

## 2018-04-23 ENCOUNTER — Inpatient Hospital Stay: Payer: Self-pay | Admitting: Physician Assistant

## 2018-04-23 ENCOUNTER — Telehealth: Payer: Self-pay | Admitting: Physician Assistant

## 2018-04-23 NOTE — Telephone Encounter (Signed)
Pt is scheduled for hospital f/u on Tuesday 05/01/18 and is being discharged from hospital today. Please advise. Thanks TNP

## 2018-04-23 NOTE — Progress Notes (Signed)
Pt was given D/C instructions along with follow up appts. Her vitals were WDL and her IV removed without incident. She was released to her son.

## 2018-04-23 NOTE — Progress Notes (Signed)
  Patient ID: Michelle Pitts, female   DOB: 12-11-1950, 67 y.o.   MRN: [id number]  HISTORY: Did well after chest tube removed.  Not short of breath.  No other problems.  Lots of questions about followup   Vitals:   04/22/18 2103 04/23/18 0546  BP: 105/60 136/77  Pulse: 79 85  Resp: 17 18  Temp: 98.4 F (36.9 C) 97.7 F (36.5 C)  SpO2: 100% 98%     EXAM:    Resp: Lungs are clear bilaterally.  No respiratory distress, normal effort. Heart:  Regular without murmurs Abd:  Abdomen is soft, non distended and non tender. No masses are palpable.  There is no rebound and no guarding.  Neurological: Alert and oriented to person, place, and time. Coordination normal.  Skin: Skin is warm and dry. No rash noted. No diaphoretic. No erythema. No pallor.  Psychiatric: Normal mood and affect. Normal behavior. Judgment and thought content normal.   Independent review of CXRay from this morning looks good without pneumothorax    ASSESSMENT: LLL process status post biopsy.  Path shows atypical cells only.   PLAN:   I would like to see her back in the office in one week with a repeat CXray.  She is also scheduled to see Dr. Donella Stade for his opinion as well.      Nestor Lewandowsky, MDPatient ID: Michelle Pitts, female   DOB: Oct 31, 1950, 67 y.o.   MRN: [id number]

## 2018-04-23 NOTE — Care Management (Signed)
Chest tube removed.  Patient maintaining saturations on RA.  Ambulating independently.  Follow ups scheduled.  No RNCM needs identified.

## 2018-04-23 NOTE — Care Management Important Message (Signed)
Copy of signed IM left with patient in room.  

## 2018-04-23 NOTE — Discharge Summary (Signed)
[name] at [facility], 67 y.o., DOB 01/16/1951, MRN [id number]. Admission date: 04/18/2018 Discharge Date 04/23/2018 Primary MD Michelle Daring, PA-C Admitting Physician Henreitta Leber, MD  Admission Diagnosis  Pneumothorax [J93.9] Chest tube in place [Z96.89] Pneumothorax, unspecified type [J93.9]  Discharge Diagnosis   Active Problems: Recurrent left-sided pneumothorax Lung nodule Anxiety COPD Essential hypertension GERD    Hospital Course -year-old female with past medical history of COPD, breast cancer, hypothyroidism, hypertension, recent admission for pneumothorax post bronchoscopy who returns back to the hospital.  Patient's was evaluated in the ED again noted to pneumothorax.  This time surgical CT surgery consult was obtained.  Patient had an of the chest tube placed.  She was monitored until her pneumothorax resolved.  Patient is doing much better and is stable for discharge.             Consults  general surgery  Significant Tests:  See full reports for all details     Dg Chest 2 View  Result Date: 04/21/2018 CLINICAL DATA:  Left-sided chest tube.  Lung cancer. EXAM: CHEST - 2 VIEW COMPARISON:  One-view chest x-ray 04/20/2018 FINDINGS: The heart size is normal. There is no edema or effusion. A left-sided pigtail chest tube is in place. There is no pneumothorax. Known ground-glass nodule in the left lower lobe is again seen. No new nodule or airspace disease is present. The visualized soft tissues and bony thorax are unremarkable. IMPRESSION: 1. Left chest tube in stable position. 2. No pneumothorax. Electronically Signed   By: San Morelle M.D.   On: 04/21/2018 09:24   Dg Chest 2 View  Addendum Date: 04/18/2018   ADDENDUM REPORT: 04/18/2018 14:14 ADDENDUM: Critical Value/emergent results were called by telephone at the time of interpretation on 04/18/2018 at 1410 hours to Dr. Charlaine Dalton , who  verbally acknowledged these results. Electronically Signed   By: Genevie Ann M.D.   On: 04/18/2018 14:14   Result Date: 04/18/2018 CLINICAL DATA:  67 year old female with left side pneumothorax following endoscopic biopsy treated with chest tube placement. Chest tube removed on 04/16/2018, but worsening shortness of breath today. EXAM: CHEST - 2 VIEW COMPARISON:  04/16/2018 and earlier. FINDINGS: Recurrent moderate size left pneumothorax with pleural edge is visible in the left upper lung and laterally. Mildly lower underlying lung volumes. Mediastinal contours remain normal. The right lung remains clear. Visualized tracheal air column is within normal limits. No pleural effusion. No acute osseous abnormality identified. Negative visible bowel gas pattern. IMPRESSION: Recurrent moderate size left pneumothorax. Electronically Signed: By: Genevie Ann M.D. On: 04/18/2018 14:04   Dg Chest Port 1 View  Result Date: 04/23/2018 CLINICAL DATA:  Follow-up pneumothorax EXAM: PORTABLE CHEST 1 VIEW COMPARISON:  Portable chest x-ray of April 22, 2018 FINDINGS: There has been interval removal of the pigtail pleural catheter on the left. No recurrent pneumothorax is observed. There is no pleural effusion. The interstitial markings of both lungs are mildly prominent though stable. The heart and pulmonary vascularity are normal. The bony thorax is unremarkable. IMPRESSION: No recurrent pneumothorax following removal of the left chest tube. No acute cardiopulmonary abnormality. Electronically Signed   By: David  Martinique M.D.   On: 04/23/2018 08:29   Dg Chest Port 1 View  Result Date: 04/22/2018 CLINICAL DATA:  Follow-up left pneumothorax. EXAM: PORTABLE CHEST 1 VIEW COMPARISON:  04/21/2018 FINDINGS: The heart size and mediastinal contours are within normal limits. Left pleural catheter remains in place and  no pneumothorax is visualized. No evidence of pulmonary infiltrate or pleural effusion. IMPRESSION: Left pleural catheter  remains in place. No pneumothorax or other acute findings. Electronically Signed   By: Earle Gell M.D.   On: 04/22/2018 14:21   Dg Chest Port 1 View  Result Date: 04/20/2018 CLINICAL DATA:  Chest tube placement for pneumothorax. History of lung carcinoma EXAM: PORTABLE CHEST 1 VIEW COMPARISON:  April 19, 2018 FINDINGS: Chest tube on the left is unchanged in position. Currently no pneumothorax is evident. There is no edema or consolidation. The heart size and pulmonary vascularity are normal. No adenopathy. No evident bone lesions. IMPRESSION: Unchanged chest tube position on the left without evident pneumothorax. No edema or consolidation. Stable cardiac silhouette. Electronically Signed   By: Lowella Grip III M.D.   On: 04/20/2018 07:25   Dg Chest Port 1 View  Result Date: 04/19/2018 CLINICAL DATA:  Malignant neoplasm right lower lobe. EXAM: PORTABLE CHEST 1 VIEW COMPARISON:  01/16/2018. FINDINGS: Left chest tube noted in stable position. Tiny left pneumothorax noted on today's exam. No focal alveolar infiltrate. Mild left base subsegmental atelectasis. Heart size normal. IMPRESSION: Left chest tube again noted over the left chest in stable position. Tiny left pneumothorax noted on today's exam. Critical Value/emergent results were called by telephone at the time of interpretation on 04/19/2018 at 7:28 am to nurse Barnabas Lister, who verbally acknowledged these results. Electronically Signed   By: Marcello Moores  Register   On: 04/19/2018 07:32   Dg Chest Port 1 View  Result Date: 04/18/2018 CLINICAL DATA:  Chest tube placement for pneumothorax. EXAM: PORTABLE CHEST 1 VIEW COMPARISON:  1357 hours on the same day FINDINGS: Portable AP semi upright view of the chest. New pigtail catheter projects over the medial left mid thorax with re-expansion of the left upper lobe. No apparent pneumothorax on current exam. Subsegmental atelectasis is seen in the left mid lung. Right lung is clear. No mediastinal shift. Heart and  mediastinal contours are stable. IMPRESSION: New left-sided chest tube projects over the medial mid left thorax with interval improvement and re-expansion of the left upper lobe. A pneumothorax is currently not identified. Electronically Signed   By: Ashley Royalty M.D.   On: 04/18/2018 18:03   Dg Chest Port 1 View  Result Date: 04/16/2018 CLINICAL DATA:  Status post bronchoscopy 04/12/2018. Status post left chest tube removal today. Shortness of breath. EXAM: PORTABLE CHEST 1 VIEW COMPARISON:  Single-view of the chest 04/15/2018. FINDINGS: Pigtail catheter has been removed from the left chest. No pneumothorax is identified. The lungs are emphysematous but appear clear. Heart size is normal. No pneumothorax or pleural effusion. IMPRESSION: Left chest tube has been removed. Negative for pneumothorax. No acute disease. Electronically Signed   By: Inge Rise M.D.   On: 04/16/2018 11:02   Dg Chest Port 1 View  Result Date: 04/15/2018 CLINICAL DATA:  Evaluate chest tube EXAM: PORTABLE CHEST 1 VIEW COMPARISON:  Chest radiograph 04/15/2018 FINDINGS: Normal heart size. Bibasilar atelectasis. Left chest tube remains in position. No definite left-sided pneumothorax. IMPRESSION: Left chest tube remains in place. No definite left-sided pneumothorax. Electronically Signed   By: Lovey Newcomer M.D.   On: 04/15/2018 17:36   Dg Chest Port 1 View  Result Date: 04/15/2018 CLINICAL DATA:  Patient status post bronchoscopy 04/12/2018. Left chest tube in place. EXAM: PORTABLE CHEST 1 VIEW COMPARISON:  Single-view of the chest 04/14/2018 and 04/13/2018. CT chest 04/12/2018. FINDINGS: Pigtail catheter remains in place in the left chest. No pneumothorax.  Lungs are clear. Heart size is normal. No pleural effusion. No acute bony abnormality. IMPRESSION: No acute disease. Negative for pneumothorax with a left chest tube in place. Electronically Signed   By: Inge Rise M.D.   On: 04/15/2018 09:09   Dg Chest Port 1  View  Result Date: 04/14/2018 CLINICAL DATA:  Follow-up pneumothorax EXAM: PORTABLE CHEST 1 VIEW COMPARISON:  Yesterday FINDINGS: Normal heart size and mediastinal contours. There is bronchitic airway thickening that is chronic. No visible pneumothorax. Stable left chest tube positioning. Normal heart size. IMPRESSION: No visible pneumothorax. Electronically Signed   By: Monte Fantasia M.D.   On: 04/14/2018 08:36   Dg Chest Port 1 View  Result Date: 04/13/2018 CLINICAL DATA:  Left-sided chest tube placement. EXAM: PORTABLE CHEST 1 VIEW COMPARISON:  One-view chest x-ray of the same day at 6 o'clock a.m. FINDINGS: Heart size is normal. A left-sided pigtail drainage catheter has been placed in the interval. The left lung is re-expanded. No significant residual pneumothorax is present. Left basilar airspace disease likely reflects atelectasis. IMPRESSION: 1. Re-expansion of the left lung following placement of a pigtail all purpose drainage catheter. Electronically Signed   By: San Morelle M.D.   On: 04/13/2018 15:38   Portable Chest 1 View  Result Date: 04/13/2018 CLINICAL DATA:  67 y/o  F; pneumothorax. EXAM: PORTABLE CHEST 1 VIEW COMPARISON:  04/12/2018 chest radiograph. FINDINGS: Stable cardiac silhouette. No rightward mediastinal shift. Mild interval progression of the left-sided pneumothorax. Increasing atelectasis in the left lung. Nodular opacities projecting over the left lower lung zone again noted. No acute osseous abnormality identified. IMPRESSION: Mild interval progression of left-sided pneumothorax. No mediastinal shift. These results will be called to the ordering clinician or representative by the Radiologist Assistant, and communication documented in the PACS or zVision Dashboard. Electronically Signed   By: Kristine Garbe M.D.   On: 04/13/2018 06:28   Dg Chest Port 1 View  Result Date: 04/12/2018 CLINICAL DATA:  Status post bronchoscopy. EXAM: PORTABLE CHEST 1 VIEW  COMPARISON:  Prior CTs and radiographs FINDINGS: The cardiomediastinal silhouette is unremarkable. A small LEFT apical pneumothorax is noted, approximately 15%. Nodular opacity overlying the mid-lower LEFT lung again noted. There is no evidence of pleural effusion. No other changes identified. IMPRESSION: Small LEFT apical pneumothorax, approximately 15%. Critical Value/emergent results were called by telephone at the time of interpretation on 04/12/2018 at 2:59 pm to [facility], nurse for this patient, who verbally acknowledged these results. Electronically Signed   By: Margarette Canada M.D.   On: 04/12/2018 14:59   Dg C-arm 1-60 Min-no Report  Result Date: 04/12/2018 Fluoroscopy was utilized by the requesting physician.  No radiographic interpretation.   Ct Image Guided Fluid Drain By Catheter  Result Date: 04/13/2018 INDICATION: Enlarging symptomatic left-sided pneumothorax following endoscopic biopsy. EXAM: CT-GUIDED LEFT-SIDED CHEST TUBE PLACEMENT COMPARISON:  Chest radiograph-earlier same day; 04/12/2018 MEDICATIONS: The patient is currently admitted to the hospital and receiving intravenous antibiotics. The antibiotics were administered within an appropriate time frame prior to the initiation of the procedure. ANESTHESIA/SEDATION: Moderate (conscious) sedation was employed during this procedure. A total of Versed 150 mg and Fentanyl 3 mcg was administered intravenously. Moderate Sedation Time: 24 minutes. The patient's level of consciousness and vital signs were monitored continuously by radiology nursing throughout the procedure under my direct supervision. CONTRAST:  None COMPLICATIONS: None immediate. PROCEDURE: Informed written consent was obtained from the patient after a discussion of the risks, benefits and alternatives to treatment. The patient was placed  supine on the CT gantry and a pre procedural CT was performed re-demonstrating the large left-sided pneumothorax. The procedure was planned. A  timeout was performed prior to the initiation of the procedure. The skin overlying the anterior aspect the left upper chest was prepped and draped in the usual sterile fashion. The overlying soft tissues were anesthetized with 1% lidocaine with epinephrine. Appropriate trajectory was planned with the use of a 22 gauge spinal needle. An 18 gauge trocar needle was advanced into the left pleural space and a short Amplatz super stiff wire was coiled within the apex of the left pleural space. Appropriate positioning was confirmed with a limited CT scan. The tract was serially dilated allowing placement of a 10 [place] all-purpose drainage catheter. Appropriate positioning was confirmed with a limited postprocedural CT scan. The drainage catheter was connected to a pleura vac and sutured in place. Postprocedural imaging demonstrated near complete evacuation of the left-sided pneumothorax. A dressing was placed. The patient tolerated the procedure well without immediate post procedural complication. IMPRESSION: Successful CT guided placement of a 10 French all purpose drain catheter into the apical aspect of the left pleural space for enlarging symptomatic left-sided pneumothorax. Electronically Signed   By: Sandi Mariscal M.D.   On: 04/13/2018 15:02   Ct Image Guided Drainage By Percutaneous Catheter  Result Date: 04/18/2018 INDICATION: 67 year old with a recurrent symptomatic left pneumothorax. The initial pneumothorax developed following a bronchoscopic lung biopsy. EXAM: CT-GUIDED PLACEMENT OF LEFT CHEST TUBE MEDICATIONS: No antibiotics. ANESTHESIA/SEDATION: 3.5 mg IV Versed 125 mcg IV Fentanyl Moderate Sedation Time:  25 minutes The patient was continuously monitored during the procedure by the interventional radiology nurse under my direct supervision. COMPLICATIONS: None immediate. TECHNIQUE: Informed written consent was obtained from the patient after a thorough discussion of the procedural risks, benefits and  alternatives. All questions were addressed. Maximal Sterile Barrier Technique was utilized including caps, mask, sterile gowns, sterile gloves, sterile drape, hand hygiene and skin antiseptic. A timeout was performed prior to the initiation of the procedure. PROCEDURE: Patient was placed supine on CT scanner with the left arm elevated. CT images through the chest were obtained. The left lateral chest was targeted for tube placement. The left mid axillary region was prepped and draped in sterile fashion. Skin and soft tissues were anesthetized with 1% lidocaine. Payette catheter was directed in the pleural space while aspirating. A stiff Amplatz wire was placed. CT images confirmed wire placement. The tract was dilated to accommodate a 14 [place] multipurpose drain. The drain was easily advanced over the wire. The drain was attached to a PleurEvac with suction. Follow up CT images demonstrated a re-expanded left lung. The catheter was sutured to skin. FINDINGS: Large left pneumothorax. Chest tube placed from a left lateral approach. Left lung was re-expanded at the end of the procedure. IMPRESSION: Successful placement of a CT-guided left chest tube. Electronically Signed   By: Markus Daft M.D.   On: 04/18/2018 17:36   Ct Super D Chest Wo Contrast  Result Date: 04/12/2018 CLINICAL DATA:  Super D chest for biopsy planning. EXAM: CT CHEST WITHOUT CONTRAST TECHNIQUE: Multidetector CT imaging of the chest was performed using thin slice collimation for electromagnetic bronchoscopy planning purposes, without intravenous contrast. COMPARISON:  PET-CT 03/06/2018 and chest CT 12/06/2017. FINDINGS: Cardiovascular: The heart is normal in size. No pericardial effusion. Stable mild tortuosity and calcification of the thoracic aorta. Stable scattered coronary artery calcifications. Mediastinum/Nodes: No mediastinal or hilar mass or lymphadenopathy. The esophagus is  grossly normal. Lungs/Pleura: Stable 18 mm ground-glass  nodule in the left lower lobe on image number 29. No other pulmonary lesions are identified. No acute pulmonary findings. Scattered bullous changes. Upper Abdomen: No significant upper abdominal findings. Status post cholecystectomy without biliary dilatation. No hepatic or adrenal gland lesions are identified. Musculoskeletal: No worrisome breast lesions. Evidence of prior left breast surgery and possible radiation. No supraclavicular or axillary adenopathy. No significant bony findings. IMPRESSION: 1. Stable 18 mm ground-glass nodule in the left lower lobe. 2. No enlarged mediastinal or hilar lymph nodes. 3. Evidence of previous left breast surgery and possible radiation but no recurrent breast mass or left axillary adenopathy. Aortic Atherosclerosis (ICD10-I70.0) and Emphysema (ICD10-J43.9). Electronically Signed   By: Marijo Sanes M.D.   On: 04/12/2018 15:15       Today   Subjective:   Michelle Pitts patient doing much better denies any complaint Objective:   Blood pressure 138/60, pulse 83, temperature 97.9 F (36.6 C), temperature source Oral, resp. rate 17, height 5\' 4"  (1.626 m), weight 112.3 kg, SpO2 100 %.  .  Intake/Output Summary (Last 24 hours) at 04/23/2018 1227 Last data filed at 04/23/2018 1001 Gross per 24 hour  Intake 980 ml  Output 600 ml  Net 380 ml    Exam VITAL SIGNS: Blood pressure 138/60, pulse 83, temperature 97.9 F (36.6 C), temperature source Oral, resp. rate 17, height 5\' 4"  (1.626 m), weight 112.3 kg, SpO2 100 %.  GENERAL:  67 y.o.-year-old patient lying in the bed with no acute distress.  EYES: Pupils equal, round, reactive to light and accommodation. No scleral icterus. Extraocular muscles intact.  HEENT: Head atraumatic, normocephalic. Oropharynx and nasopharynx clear.  NECK:  Supple, no jugular venous distention. No thyroid enlargement, no tenderness.  LUNGS: Normal breath sounds bilaterally, no wheezing, rales,rhonchi or crepitation. No use of  accessory muscles of respiration.  CARDIOVASCULAR: S1, S2 normal. No murmurs, rubs, or gallops.  ABDOMEN: Soft, nontender, nondistended. Bowel sounds present. No organomegaly or mass.  EXTREMITIES: No pedal edema, cyanosis, or clubbing.  NEUROLOGIC: Cranial nerves II through XII are intact. Muscle strength 5/5 in all extremities. Sensation intact. Gait not checked.  PSYCHIATRIC: The patient is alert and oriented x 3.  SKIN: No obvious rash, lesion, or ulcer.   Data Review     CBC w Diff:  Lab Results  Component Value Date   WBC 6.1 04/19/2018   HGB 12.5 04/19/2018   HGB 12.8 02/14/2018   HCT 36.7 04/19/2018   HCT 39.4 02/14/2018   PLT 230 04/19/2018   PLT 228 02/14/2018   LYMPHOPCT 20 04/18/2018   LYMPHOPCT 19.2 07/14/2014   MONOPCT 9 04/18/2018   MONOPCT 6.5 07/14/2014   EOSPCT 1 04/18/2018   EOSPCT 0.6 07/14/2014   BASOPCT 1 04/18/2018   BASOPCT 0.6 07/14/2014   CMP:  Lab Results  Component Value Date   NA 140 04/19/2018   NA 141 02/14/2018   NA 138 07/14/2014   K 4.1 04/19/2018   K 4.8 07/14/2014   CL 106 04/19/2018   CL 102 07/14/2014   CO2 27 04/19/2018   CO2 29 07/14/2014   BUN 24 (H) 04/19/2018   BUN 19 02/14/2018   BUN 11 07/14/2014   CREATININE 0.82 04/19/2018   CREATININE 0.96 07/14/2014   PROT 6.9 02/14/2018   PROT 7.4 07/14/2014   ALBUMIN 4.4 02/14/2018   ALBUMIN 3.9 07/14/2014   BILITOT 0.2 02/14/2018   BILITOT 0.4 07/14/2014   ALKPHOS 134 (H) 02/14/2018  ALKPHOS 165 (H) 07/14/2014   AST 28 02/14/2018   AST 40 (H) 07/14/2014   ALT 41 (H) 02/14/2018   ALT 57 07/14/2014  .  Micro Results No results found for this or any previous visit (from the past 240 hour(s)).      Code Status Orders  (From admission, onward)         Start     Ordered   04/18/18 1958  Full code  Continuous     04/18/18 1957        Code Status History    Date Active Date Inactive Code Status Order ID Comments User Context   04/14/2018 1223 04/16/2018 1820  Partial Code [id number]  Dustin Flock, MD Inpatient   04/12/2018 1537 04/14/2018 1223 Full Code [id number]  Henreitta Leber, MD Inpatient          Follow-up Information    Nestor Lewandowsky, MD. Go on 04/30/2018.   Specialties:  Cardiothoracic Surgery, General Surgery Why:  @3pm  Contact information: [address] [place] [address] [phone]        Michelle Pitts, [place] On 05/01/2018.   Specialty:  Family Medicine Why:  @1 :20p Contact information: [place] [address] [phone]           Discharge Medications   Allergies as of 04/23/2018      Reactions   Codeine Nausea And Vomiting   Hydrochlorothiazide Swelling   Swelling of tongue.   Levaquin [levofloxacin In D5w] Hives, Swelling   Leg swelling      Medication List    STOP taking these medications   doxycycline 100 MG tablet Commonly known as:  VIBRA-TABS     TAKE these medications   albuterol 108 (90 Base) MCG/ACT inhaler Commonly known as:  PROVENTIL HFA;VENTOLIN HFA Inhale 2 puffs into the lungs every 6 (six) hours as needed for wheezing or shortness of breath.   ALPRAZolam 0.5 MG tablet Commonly known as:  XANAX Take 1 tablet (0.5 mg total) by mouth 2 (two) times daily. As needed.   aspirin EC 81 MG tablet Take 81 mg by mouth daily.   cholecalciferol 1000 units tablet Commonly known as:  VITAMIN D Take 1 tablet (1,000 Units total) by mouth daily.   fluticasone 50 MCG/ACT nasal spray Commonly known as:  FLONASE Place 2 sprays into both nostrils 2 (two) times daily as needed for allergies or rhinitis.   Fluticasone-Salmeterol 250-50 MCG/DOSE Aepb Commonly known as:  ADVAIR Inhale 1 puff into the lungs 2 (two) times daily.   ketotifen 0.025 % ophthalmic solution Commonly known as:  ZADITOR Place 1 drop into both eyes 2 (two) times daily as needed.   metoprolol succinate 25 MG 24 hr tablet Commonly known as:  TOPROL-XL Take 1 tablet  (25 mg total) by mouth daily.   naproxen sodium 220 MG tablet Commonly known as:  ALEVE Take 220 mg by mouth daily as needed.   neomycin-bacitracin-polymyxin ointment Commonly known as:  NEOSPORIN Apply topically daily.   ranitidine 150 MG tablet Commonly known as:  ZANTAC Take 150 mg by mouth at bedtime.          Total Time in preparing paper work, data evaluation and todays exam - 37 minutes  Dustin Flock M.D on 04/23/2018 at 12:27 PM [place]  [phone]

## 2018-04-24 ENCOUNTER — Ambulatory Visit: Payer: Medicare Other | Admitting: Internal Medicine

## 2018-04-24 ENCOUNTER — Encounter: Payer: Self-pay | Admitting: Cardiothoracic Surgery

## 2018-04-24 ENCOUNTER — Encounter: Payer: Self-pay | Admitting: Internal Medicine

## 2018-04-24 VITALS — BP 130/96 | HR 91 | Resp 16 | Ht 64.0 in | Wt 242.0 lb

## 2018-04-24 DIAGNOSIS — J939 Pneumothorax, unspecified: Secondary | ICD-10-CM

## 2018-04-24 DIAGNOSIS — R911 Solitary pulmonary nodule: Secondary | ICD-10-CM | POA: Diagnosis not present

## 2018-04-24 NOTE — Patient Instructions (Addendum)
Follow up with Dr. Genevive Bi and Dr. Donella Stade for lung nodule

## 2018-04-24 NOTE — Telephone Encounter (Signed)
Yes I guess we should do another to make sure she is improving.  Thank you.

## 2018-04-24 NOTE — Progress Notes (Signed)
Name: ROSHONDA SPERL MRN: [id number] DOB: [birth year]     CONSULTATION DATE: 03/28/2018 REFERRING MD : Tollie Pizza  CHIEF COMPLAINT: Abnormal CT chest findings  STUDIES:   11/2017   CT chest Independently reviewed by Me Left lower lobe lung nodule groundglass opacification 12.1 mm to 15 mm to 16.6 mm progressive [place] growing over the last 2 years   HISTORY OF PRESENT ILLNESS: 67 year old female seen today for abnormal CT chest findings  Patient has a left lower lobe lung nodule groundglass opacification that has been slowly growing over the past several years  Patient underwent ENB complicated by acute PTX-s/p chest tube placed by CT then removed a few days later Patient was re-admitted for PTX and repeat Chest tube placed for persistent PTX    No signs of infection at this time No signs of heart failure at this time  Patient has a chronic shortness of breath but no acute distress at this time No evidence of fevers chills nausea vomiting diarrhea  Her breath sounds are equal and WNL She has no acute SOB She has recovered form her persistent PTX Left sided chest tube site looks clean and intact  Pathology results reviewed with patient +atypcal cells seen on slides  Prior to Admission medications   Medication Sig Start Date End Date Taking? Authorizing Provider  albuterol (PROAIR HFA) 108 (90 Base) MCG/ACT inhaler Inhale 2 puffs into the lungs every 6 (six) hours as needed for wheezing or shortness of breath. 03/21/17  Yes Mar Daring, PA-C  ALPRAZolam Duanne Moron) 0.5 MG tablet Take 1 tablet (0.5 mg total) by mouth 2 (two) times daily. As needed. 11/30/17  Yes Mar Daring, PA-C  aspirin EC 81 MG tablet Take 81 mg by mouth daily.   Yes [provider]  cholecalciferol (VITAMIN D) 1000 units tablet Take 1 tablet (1,000 Units total) by mouth daily. 02/10/16  Yes Margarita Rana, MD  Fluticasone-Salmeterol (ADVAIR) 250-50 MCG/DOSE AEPB Inhale 1 puff into the  lungs 2 (two) times daily. 08/02/17  Yes Mar Daring, PA-C  metoprolol succinate (TOPROL-XL) 25 MG 24 hr tablet Take 1 tablet (25 mg total) by mouth daily. 10/04/17  Yes Mar Daring, PA-C   Allergies  Allergen Reactions  . Codeine Nausea And Vomiting  . Hydrochlorothiazide Swelling    Swelling of tongue.  Mack Hook [Levofloxacin In D5w] Hives and Swelling    Leg swelling     REVIEW OF SYSTEMS:   Constitutional: Negative for fever, chills, weight loss, malaise/fatigue and diaphoresis.  HENT: Negative for hearing loss, ear pain, nosebleeds, congestion, sore throat, neck pain, tinnitus and ear discharge.   Eyes: Negative for blurred vision, double vision, photophobia, pain, discharge and redness.  Respiratory: Negative for cough, hemoptysis, sputum production, shortness of breath, wheezing and stridor.   Cardiovascular: Negative for chest pain, palpitations, orthopnea, claudication, leg swelling and PND.  Gastrointestinal: Negative for heartburn, nausea, vomiting, abdominal pain, diarrhea, constipation, blood in stool and melena.  ALL OTHER ROS ARE NEGATIVE   BP (!) 130/96 (BP Location: Left Arm, Cuff Size: Large)   Pulse 91   Resp 16   Ht 5\' 4"  (1.626 m)   Wt 242 lb (109.8 kg)   SpO2 95%   BMI 41.54 kg/m   Physical Examination:   GENERAL:NAD, no fevers, chills, no weakness no fatigue HEAD: Normocephalic, atraumatic.  EYES: Pupils equal, round, reactive to light. Extraocular muscles intact. No scleral icterus.  MOUTH: Moist mucosal membrane.   EAR, NOSE, THROAT: Clear  without exudates. No external lesions.  NECK: Supple. No thyromegaly. No nodules. No JVD.  PULMONARY:CTA B/L no wheezes, no crackles, no rhonchi CARDIOVASCULAR: S1 and S2. Regular rate and rhythm. No murmurs, rubs, or gallops. No edema.  GASTROINTESTINAL: Soft, nontender, nondistended. No masses. Positive bowel sounds.  MUSCULOSKELETAL: No swelling, clubbing, or edema. Range of motion full in  all extremities.  Left sided chest tube insertion site looks healed, no pus or tenderness NEUROLOGIC: Cranial nerves II through XII are intact. No gross focal neurological deficits.  SKIN: No ulceration, lesions, rashes, or cyanosis. Skin warm and dry. Turgor intact.  PSYCHIATRIC: Mood, affect within normal limits. The patient is awake, alert and oriented x 3. Insight, judgment intact.      ASSESSMENT / PLAN: 67 year old pleasant white female seen today for follow up abnormal CT scan chest findings with slow-growing groundglass opacification in the left lower lobe increasing from 12 mm to 16 mm of the last several years complicated by iatrogenic PTX s/p ENB with persistent PTX which has  now resolved  Most likely etiology is slow-growing adenocarcinoma however tissue diagnosis  Patient will need follow up with Dr Genevive Bi and Dr Donella Stade  Patient has underlying COPD moderate to severe on PFT's howevere her symptoms do NOT match with her pulmonary function testing  She uses albuterol and advair as needed and this works well for her.   Patient  satisfied with Plan of action and management. All questions answered Follow up in 1 year   Marja Adderley Patricia Pesa, M.D.  [facility]  Medical Director [facility] Director [facility] Cardio-Pulmonary Department

## 2018-04-25 ENCOUNTER — Institutional Professional Consult (permissible substitution): Payer: Medicare Other | Admitting: Radiation Oncology

## 2018-04-26 ENCOUNTER — Encounter: Payer: Self-pay | Admitting: Radiation Oncology

## 2018-04-26 ENCOUNTER — Encounter: Payer: Self-pay | Admitting: *Deleted

## 2018-04-26 ENCOUNTER — Ambulatory Visit
Admission: RE | Admit: 2018-04-26 | Discharge: 2018-04-26 | Disposition: A | Discharge status: Home / Self Care | Payer: Medicare Other | Source: Ambulatory Visit | Attending: Radiation Oncology | Admitting: Radiation Oncology

## 2018-04-26 ENCOUNTER — Other Ambulatory Visit: Payer: Self-pay

## 2018-04-26 ENCOUNTER — Ambulatory Visit: Payer: Medicare Other | Admitting: Internal Medicine

## 2018-04-26 DIAGNOSIS — C3432 Malignant neoplasm of lower lobe, left bronchus or lung: Secondary | ICD-10-CM

## 2018-04-26 NOTE — Telephone Encounter (Signed)
Tried to contact pt on two different occasions and LM to CB. Pt did not return the calls or VMs left requesting a CB. Hospital f/u apt scheduled for 05/01/18 @ 1:20 PM. Juluis Rainier! -MM

## 2018-04-26 NOTE — Telephone Encounter (Signed)
Patient returning a call to [facility]

## 2018-04-26 NOTE — Consult Note (Signed)
NEW PATIENT EVALUATION  Name: Michelle Pitts  MRN: [id number]  Date:   04/26/2018     DOB: 04-13-1951   This 67 y.o. female patient presents to the clinic for initial evaluation of stage I non-small cell lung cancer of the left lower lobe inpatient previously treated for stage I breast cancer back in 2016.  REFERRING PHYSICIAN: Mar Daring, [place]*  CHIEF COMPLAINT:  Chief Complaint  Patient presents with  . Lung Cancer    Pt is here as an OPNA for lung nodule    DIAGNOSIS: There were no encounter diagnoses.   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 She and underwent bronchoscopy showing atypical cells with no obvious malignancy noted. She developed a pneumothorax which has resolved.she is seen today for consideration of treatment. She recently had a successful placement a CT-guided chest tube for large left pneumothorax.plain films showed no recurrent pneumothorax following removal of left chest tube. She specifically denies cough hemoptysis or chest tightness.  PLANNED TREATMENT REGIMEN: SB RT to left lower lobe  PAST MEDICAL HISTORY:  has a past medical history of Anxiety, Breast cancer ([place]) (01/23/2014), Cancer ([place]) (1995), COPD (chronic obstructive pulmonary disease) ([place]), Dysrhythmia, GERD (gastroesophageal reflux disease), History of colon polyps, Hypercholesterolemia, Hypertension, Hypothyroidism, Insomnia, Papilloma of breast (01/09/2014), Personal history of radiation therapy, Personal history of tobacco use, presenting hazards to health (11/18/2015), Sleep apnea, Sleep apnea, SOB  (shortness of breath) on exertion, Spinal stenosis, Thyroid disease, and Vitamin D deficiency.    PAST SURGICAL HISTORY:  Past Surgical History:  Procedure Laterality Date  . BLADDER SUSPENSION  2001  . BREAST BIOPSY Right 1975, 1985   benign cyst [name]) and fibroadenoma([facility])  . BREAST BIOPSY Right 01/03/2014   Papilloma, no atypia detected on mammography.  Marland Kitchen BREAST BIOPSY Left 01/03/2014   Invasive mammary carcinoma.  Marland Kitchen BREAST BIOPSY Left 01/13/2015   ultrasound giuded biopsy, negative  . BREAST CYST ASPIRATION Left 1985  . BREAST LUMPECTOMY Left 01/23/2014   positive  . BREAST SURGERY Left 01/23/14   wide excision   . BREAST SURGERY  04/10/14   Debridement of fat necrosis  . CHOLECYSTECTOMY    . COLONOSCOPY  2014   Dr Tiffany Kocher  . COLONOSCOPY WITH PROPOFOL N/A 10/10/2016   Procedure: COLONOSCOPY WITH PROPOFOL;  Surgeon: Manya Silvas, MD;  Location: [facility] ENDOSCOPY;  Service: Endoscopy;  Laterality: N/A;  . ELECTROMAGNETIC NAVIGATION BROCHOSCOPY N/A 04/12/2018   Procedure: ELECTROMAGNETIC NAVIGATION BRONCHOSCOPY;  Surgeon: Flora Lipps, MD;  Location: [facility] ORS;  Service: Cardiopulmonary;  Laterality: N/A;  . ROTATOR CUFF REPAIR Right 1995  . VAGINAL HYSTERECTOMY  1978   partial  . VULVECTOMY PARTIAL  1995  . wide excision debriedment Left 04/10/14    FAMILY HISTORY: family history includes COPD in her mother; Cancer in her other; Cancer (age of onset: 8) in her maternal grandfather; Cancer (age of onset: 10) in her paternal grandfather; Crohn's disease in her sister; Diabetes in her father and sister; Emphysema in her brother, brother, and sister; Heart failure in her sister; Hypertension in her sister, sister, and sister; Lung cancer in her brother.  SOCIAL HISTORY:  reports  that she quit smoking about 3 years ago. Her smoking use included cigarettes. She has a 50.00 pack-year smoking history. She has never used smokeless tobacco. She reports that she drinks about 14.0  standard drinks of alcohol per week. She reports that she does not use drugs.  ALLERGIES: Codeine; Hydrochlorothiazide; and Levaquin [levofloxacin in d5w]  MEDICATIONS:  Current Outpatient Medications  Medication Sig Dispense Refill  . albuterol (PROAIR HFA) 108 (90 Base) MCG/ACT inhaler Inhale 2 puffs into the lungs every 6 (six) hours as needed for wheezing or shortness of breath. 18 g 3  . ALPRAZolam (XANAX) 0.5 MG tablet Take 1 tablet (0.5 mg total) by mouth 2 (two) times daily. As needed. 60 tablet 0  . aspirin EC 81 MG tablet Take 81 mg by mouth daily.    . cholecalciferol (VITAMIN D) 1000 units tablet Take 1 tablet (1,000 Units total) by mouth daily. 30 tablet 11  . fluticasone (FLONASE) 50 MCG/ACT nasal spray Place 2 sprays into both nostrils 2 (two) times daily as needed for allergies or rhinitis.    . Fluticasone-Salmeterol (ADVAIR) 250-50 MCG/DOSE AEPB Inhale 1 puff into the lungs 2 (two) times daily. 60 each 5  . ketotifen (ZADITOR) 0.025 % ophthalmic solution Place 1 drop into both eyes 2 (two) times daily as needed.    . metoprolol succinate (TOPROL-XL) 25 MG 24 hr tablet Take 1 tablet (25 mg total) by mouth daily. 90 tablet 1  . naproxen sodium (ALEVE) 220 MG tablet Take 220 mg by mouth daily as needed.    . neomycin-bacitracin-polymyxin (NEOSPORIN) ointment Apply topically daily. 15 g 0  . ranitidine (ZANTAC) 150 MG tablet Take 150 mg by mouth at bedtime.      No current facility-administered medications for this encounter.     ECOG PERFORMANCE STATUS:  0 - Asymptomatic  REVIEW OF SYSTEMS:  Patient denies any weight loss, fatigue, weakness, fever, chills or night sweats. Patient denies any loss of vision, blurred vision. Patient denies any ringing  of the ears or hearing loss. No irregular heartbeat. Patient denies heart murmur or history of fainting. Patient denies any chest pain or pain radiating to her upper extremities. Patient denies any shortness of breath, difficulty  breathing at night, cough or hemoptysis. Patient denies any swelling in the lower legs. Patient denies any nausea vomiting, vomiting of blood, or coffee ground material in the vomitus. Patient denies any stomach pain. Patient states has had normal bowel movements no significant constipation or diarrhea. Patient denies any dysuria, hematuria or significant nocturia. Patient denies any problems walking, swelling in the joints or loss of balance. Patient denies any skin changes, loss of hair or loss of weight. Patient denies any excessive worrying or anxiety or significant depression. Patient denies any problems with insomnia. Patient denies excessive thirst, polyuria, polydipsia. Patient denies any swollen glands, patient denies easy bruising or easy bleeding. Patient denies any recent infections, allergies or URI. Patient "s visual fields have not changed significantly in recent time.    PHYSICAL EXAM: BP (!) (P) 157/87 (BP Location: Left Arm)   Pulse (P) 80   Temp (P) 98.6 F (37 C) (Tympanic)   Resp (P) 16   Wt (P) 242 lb 6.3 oz (109.9 kg)   BMI (P) 41.61 kg/m  Well-developed well-nourished patient in NAD. HEENT reveals PERLA, EOMI, discs not visualized.  Oral cavity is clear. No oral mucosal lesions are identified. Neck is clear without evidence of cervical or supraclavicular adenopathy. Lungs are clear to A&P.  Cardiac examination is essentially unremarkable with regular rate and rhythm without murmur rub or thrill. Abdomen is benign with no organomegaly or masses noted. Motor sensory and DTR levels are equal and symmetric in the upper and lower extremities. Cranial nerves II through XII are grossly intact. Proprioception is intact. No peripheral adenopathy or edema is identified. No motor or sensory levels are noted. Crude visual fields are within normal range.  LABORATORY DATA: pathology reports reviewed    RADIOLOGY RESULTS:CT scans and PET/CT scans reviewed   IMPRESSION: left lower lobe  stage I non-small cell lung cancer in 67 year old female previously treated to her left breast for stage I invasive mammary carcinoma  PLAN: at this time all consultants including thoracic surgery radiology and medical oncology and myself agree this is a slow-growing probable adenocarcinoma of the left lower lobe. I would plan on doing SB RT treatment 6000 cGy in 5 fractions. Risks and benefits of treatment including possible development of cough fatigue slight chance of skin reaction and slight chance of esophagitis all were discussed in detail with the patient. I would use 4-dimensional treatment planning as well as motion restriction for her breathing during her CT simulation. Patient comprehend my treatment plan well. I have personally set up and ordered CT simulation in the next couple of weeks. Patient comprehend my treatment plan well.  I would like to take this opportunity to thank you for allowing me to participate in the care of your patient.Noreene Filbert, MD

## 2018-04-26 NOTE — Progress Notes (Signed)
  Oncology Nurse Navigator Documentation  Navigator Location: [facility] (04/26/18 1100)   )Navigator Encounter Type: Initial RadOnc (04/26/18 1100)                       Treatment Phase: Pre-Tx/Tx Discussion (04/26/18 1100) Barriers/Navigation Needs: Coordination of Care (04/26/18 1100)   Interventions: Coordination of Care (04/26/18 1100)   Coordination of Care: Appts (04/26/18 1100)         met with patient during initial rad-onc consultation with Dr. Baruch Gouty. Radiation discussed with patient at visit. All questions answered during visit. Reviewed upcoming appts. No further questions or needs at this time. Pt verbalized understanding.         Time Spent with Patient: 60 (04/26/18 1100)

## 2018-04-27 ENCOUNTER — Other Ambulatory Visit: Payer: Self-pay

## 2018-04-27 DIAGNOSIS — J939 Pneumothorax, unspecified: Secondary | ICD-10-CM

## 2018-04-30 ENCOUNTER — Ambulatory Visit: Payer: Medicare Other | Admitting: Cardiothoracic Surgery

## 2018-04-30 ENCOUNTER — Other Ambulatory Visit: Payer: Self-pay

## 2018-04-30 ENCOUNTER — Ambulatory Visit
Admission: RE | Admit: 2018-04-30 | Discharge: 2018-04-30 | Disposition: A | Discharge status: Home / Self Care | Payer: Medicare Other | Source: Ambulatory Visit | Attending: Cardiothoracic Surgery | Admitting: Cardiothoracic Surgery

## 2018-04-30 ENCOUNTER — Encounter: Payer: Self-pay | Admitting: Cardiothoracic Surgery

## 2018-04-30 VITALS — BP 158/88 | HR 72 | Temp 98.1°F | Resp 12 | Ht 64.0 in | Wt 243.0 lb

## 2018-04-30 DIAGNOSIS — J939 Pneumothorax, unspecified: Secondary | ICD-10-CM

## 2018-04-30 DIAGNOSIS — J9811 Atelectasis: Secondary | ICD-10-CM | POA: Insufficient documentation

## 2018-04-30 NOTE — Progress Notes (Signed)
  Patient ID: Michelle Pitts, female   DOB: 19-Aug-1950, 66 y.o.   MRN: [id number]  HISTORY: She returns today without any new complaints.  She has seen Dr. Duke Salvia and Crystal since her hospital discharge.  She has no complaints today.  She is not short of breath.  She has had no cough.  She states that Dr. Donella Stade has recommended SBRT for her left lower lobe mass.   Vitals:   04/30/18 1456  BP: (!) 158/88  Pulse: 72  Resp: 12  Temp: 98.1 F (36.7 C)  SpO2: 96%     EXAM:    Resp: Lungs are clear bilaterally.  No respiratory distress, normal effort. Heart:  Regular without murmurs Abd:  Abdomen is soft, non distended and non tender. No masses are palpable.  There is no rebound and no guarding.  Neurological: Alert and oriented to person, place, and time. Coordination normal.  Skin: Skin is warm and dry. No rash noted. No diaphoretic. No erythema. No pallor. The chest tube site is clean.  There is a small firm area at the skin entrance site.  There is no erythema or drainage. Psychiatric: Normal mood and affect. Normal behavior. Judgment and thought content normal.    ASSESSMENT: She did have a chest x-ray today which have independently reviewed.  There is no evidence of a pneumothorax.   PLAN:   I had a long discussion with her again regarding the results of the biopsy and CT scans.  I reviewed with her pulmonary function studies as well as her prior CTs and her most recent CT.  After an extensive discussion regarding the option of surgical therapy for her left lower lobe nodule she has decided to pursue radiation therapy.  I did not make a return visit for her but would be happy to see her should the need arise.    Nestor Lewandowsky, MD

## 2018-05-01 ENCOUNTER — Encounter: Payer: Self-pay | Admitting: Physician Assistant

## 2018-05-01 ENCOUNTER — Ambulatory Visit: Payer: Medicare Other | Admitting: Physician Assistant

## 2018-05-01 ENCOUNTER — Telehealth: Payer: Self-pay | Admitting: Obstetrics & Gynecology

## 2018-05-01 VITALS — BP 140/96 | HR 80 | Temp 98.4°F | Wt 242.2 lb

## 2018-05-01 DIAGNOSIS — C3431 Malignant neoplasm of lower lobe, right bronchus or lung: Secondary | ICD-10-CM | POA: Diagnosis not present

## 2018-05-01 DIAGNOSIS — Z171 Estrogen receptor negative status [ER-]: Secondary | ICD-10-CM

## 2018-05-01 DIAGNOSIS — N83201 Unspecified ovarian cyst, right side: Secondary | ICD-10-CM | POA: Diagnosis not present

## 2018-05-01 DIAGNOSIS — C519 Malignant neoplasm of vulva, unspecified: Secondary | ICD-10-CM | POA: Diagnosis not present

## 2018-05-01 DIAGNOSIS — N83202 Unspecified ovarian cyst, left side: Secondary | ICD-10-CM

## 2018-05-01 DIAGNOSIS — J95811 Postprocedural pneumothorax: Secondary | ICD-10-CM

## 2018-05-01 DIAGNOSIS — Z23 Encounter for immunization: Secondary | ICD-10-CM | POA: Diagnosis not present

## 2018-05-01 DIAGNOSIS — C50312 Malignant neoplasm of lower-inner quadrant of left female breast: Secondary | ICD-10-CM

## 2018-05-01 NOTE — Progress Notes (Signed)
Patient: Michelle Pitts Female    DOB: 01-26-1951   67 y.o.   MRN: [id number] Visit Date: 05/01/2018  Today's Provider: Mar Daring, PA-C   Chief Complaint  Patient presents with  . Hospitalization Follow-up   Subjective:    HPI  Follow up Hospitalization  Patient was admitted to [facility] on 04/13/2018 and discharged on 04/16/2018 and again 04/18/2018 and discharged on 04/23/2018. She was treated for Pneumothorax x 2.  Treatment for this included first time patient started Doxycycline . Telephone follow up was attempted twice. She reports good compliance with treatment. She reports this condition is Improved.  She has seen Dr. Rogue Bussing, Dr. Baruch Gouty and Dr. Genevive Bi in follow up. It has been decided she will undergo RXT only. She will have 5 treatments over the span of 3 weeks for her lung cancer.  ------------------------------------------------------------------------------------  Patient states she had a CXR yesterday by Dr. Genevive Bi and results were normal, no recurrence of pneumothorax.   She does mention on her PET scan it was noted that an ovarian cyst on the left has increased in size and there was a new cyst on the right. Neither were hypermetabolic, but neither was her lung cancer nodule. She does have personal history of cancer of her vulva and breast cancer. She was previously followed by [facility], but patient reports her physician recently retired and she never established with anyone else. Due to her history and noted change in the cyst she is requesting a referral to a local GYN for consideration of oopherectomy. She is s/p hysterectomy from 47. She denies any abnormal bleeding or lesions externally.  Allergies  Allergen Reactions  . Codeine Nausea And Vomiting  . Hydrochlorothiazide Swelling    Swelling of tongue.  Mack Hook [Levofloxacin In D5w] Hives and Swelling    Leg swelling      Current Outpatient Medications:  .  albuterol (PROAIR HFA)  108 (90 Base) MCG/ACT inhaler, Inhale 2 puffs into the lungs every 6 (six) hours as needed for wheezing or shortness of breath. (Patient taking differently: Inhale 2 puffs into the lungs as needed for wheezing or shortness of breath. ), Disp: 18 g, Rfl: 3 .  ALPRAZolam (XANAX) 0.5 MG tablet, Take 1 tablet (0.5 mg total) by mouth 2 (two) times daily. As needed. (Patient taking differently: Take 0.5 mg by mouth daily. As needed.), Disp: 60 tablet, Rfl: 0 .  aspirin EC 81 MG tablet, Take 81 mg by mouth daily., Disp: , Rfl:  .  cholecalciferol (VITAMIN D) 1000 units tablet, Take 1 tablet (1,000 Units total) by mouth daily., Disp: 30 tablet, Rfl: 11 .  fluticasone (FLONASE) 50 MCG/ACT nasal spray, Place 2 sprays into both nostrils as needed for allergies or rhinitis. , Disp: , Rfl:  .  Fluticasone-Salmeterol (ADVAIR) 250-50 MCG/DOSE AEPB, Inhale 1 puff into the lungs 2 (two) times daily. (Patient taking differently: Inhale 1 puff into the lungs as needed. ), Disp: 60 each, Rfl: 5 .  ketotifen (ZADITOR) 0.025 % ophthalmic solution, Place 1 drop into both eyes as needed. , Disp: , Rfl:  .  metoprolol succinate (TOPROL-XL) 25 MG 24 hr tablet, Take 1 tablet (25 mg total) by mouth daily., Disp: 90 tablet, Rfl: 1 .  naproxen sodium (ALEVE) 220 MG tablet, Take 220 mg by mouth daily as needed., Disp: , Rfl:  .  ranitidine (ZANTAC) 150 MG tablet, Take 150 mg by mouth as needed. , Disp: , Rfl:  .  neomycin-bacitracin-polymyxin (NEOSPORIN) ointment, Apply topically daily. (Patient not taking: Reported on 04/30/2018), Disp: 15 g, Rfl: 0  Review of Systems  Constitutional: Negative.   Respiratory: Negative.   Cardiovascular: Negative.   Genitourinary: Negative.   Musculoskeletal: Negative.   Neurological: Negative.     Social History   Tobacco Use  . Smoking status: Former Smoker    Packs/day: 1.00    Years: 50.00    Pack years: 50.00    Types: Cigarettes    Last attempt to quit: 09/15/2014    Years since  quitting: 3.6  . Smokeless tobacco: Never Used  Substance Use Topics  . Alcohol use: Yes    Alcohol/week: 14.0 standard drinks    Types: 14 Cans of beer per week   Objective:   BP (!) 140/96 (BP Location: Right Arm, Patient Position: Sitting, Cuff Size: Normal)   Pulse 80   Temp 98.4 F (36.9 C) (Oral)   Wt 242 lb 3.2 oz (109.9 kg)   SpO2 97%   BMI 41.57 kg/m  Vitals:   05/01/18 1315  BP: (!) 140/96  Pulse: 80  Temp: 98.4 F (36.9 C)  TempSrc: Oral  SpO2: 97%  Weight: 242 lb 3.2 oz (109.9 kg)     Physical Exam  Constitutional: She appears well-developed and well-nourished. No distress.  Neck: Normal range of motion. Neck supple.  Cardiovascular: Normal rate, regular rhythm and normal heart sounds. Exam reveals no gallop and no friction rub.  No murmur heard. Pulmonary/Chest: Effort normal and breath sounds normal. No respiratory distress. She has no wheezes. She has no rales.  Abdominal: Soft. Bowel sounds are normal. She exhibits no distension. There is no tenderness.  Skin: She is not diaphoretic.  Vitals reviewed.      Assessment & Plan:     1. Cysts of both ovaries Referral placed to GYN as below. I will f/u in 2 months.  - Ambulatory referral to Gynecology  2. 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 Rogue Bussing and Dr. Baruch Gouty. Going to get RXT therapy only for now.  - Ambulatory referral to Gynecology  3. Pneumothorax after biopsy X 2. Improved. CXR yesterday was normal.   4. 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 Previously followed by [facility] but her provider retired. Wanting to establish locally for increase in size of ovarian cyst on the left, new cyst on the right.  - Ambulatory referral to Gynecology  5. Malignant neoplasm of lower-inner quadrant of left breast in female, estrogen receptor negative ([place]) Left breast. S/p left mastectomy, chemo and RXT.  - Ambulatory referral to Gynecology  6. Need  for influenza vaccination Flu vaccine given today without complication. Patient sat upright for 15 minutes to check for adverse reaction before being released. - Flu vaccine HIGH DOSE PF (Fluzone High dose)       Mar Daring, PA-C  [facility]

## 2018-05-01 NOTE — Telephone Encounter (Signed)
[facility] referring for Cysts of both ovaries,Malignant neoplasm of bronchus of right lower lobe ([place]), Malignant neoplasm of vulva ([place]), Malignant neoplasm of lower-inner quadrant of left breast in female, estrogen receptor negative ([place]). Attempt to reach patient to schedule appointment. No answer and no voicemail unable to leave message to contact us for appointment

## 2018-05-07 ENCOUNTER — Telehealth: Payer: Self-pay | Admitting: Physician Assistant

## 2018-05-07 NOTE — Telephone Encounter (Signed)
Please give patient an appt

## 2018-05-07 NOTE — Telephone Encounter (Signed)
Patient reports she would rather go to urgent care or try OTC medication.  Next available appt is on 05/09/18

## 2018-05-07 NOTE — Telephone Encounter (Signed)
Can try meclizine ([place]) OTC.

## 2018-05-07 NOTE — Telephone Encounter (Signed)
Patient advised as directed below. Agrees with treatment plan. Reports that she has tried Meclizine before and it has worked.

## 2018-05-07 NOTE — Telephone Encounter (Signed)
Pt has been having some vertigo for a couple of days.  No throwing up or headaches.    Please advise  CB#  [phone]  thanks teri

## 2018-05-09 ENCOUNTER — Ambulatory Visit
Admission: RE | Admit: 2018-05-09 | Discharge: 2018-05-09 | Disposition: A | Discharge status: Home / Self Care | Payer: Medicare Other | Source: Ambulatory Visit | Attending: Radiation Oncology | Admitting: Radiation Oncology

## 2018-05-09 DIAGNOSIS — Z510 Encounter for antineoplastic radiation therapy: Secondary | ICD-10-CM | POA: Insufficient documentation

## 2018-05-09 DIAGNOSIS — C3432 Malignant neoplasm of lower lobe, left bronchus or lung: Secondary | ICD-10-CM | POA: Insufficient documentation

## 2018-05-14 DIAGNOSIS — C3432 Malignant neoplasm of lower lobe, left bronchus or lung: Secondary | ICD-10-CM | POA: Diagnosis not present

## 2018-05-16 ENCOUNTER — Ambulatory Visit: Payer: Medicare Other

## 2018-05-18 ENCOUNTER — Ambulatory Visit: Payer: Medicare Other

## 2018-05-21 ENCOUNTER — Ambulatory Visit: Payer: Medicare Other | Admitting: Obstetrics and Gynecology

## 2018-05-21 ENCOUNTER — Other Ambulatory Visit (HOSPITAL_COMMUNITY)
Admission: RE | Admit: 2018-05-21 | Discharge: 2018-05-21 | Disposition: A | Discharge status: Home / Self Care | Payer: Medicare Other | Source: Ambulatory Visit | Attending: Obstetrics and Gynecology | Admitting: Obstetrics and Gynecology

## 2018-05-21 ENCOUNTER — Other Ambulatory Visit: Payer: Self-pay | Admitting: Obstetrics and Gynecology

## 2018-05-21 ENCOUNTER — Encounter: Payer: Self-pay | Admitting: Obstetrics and Gynecology

## 2018-05-21 VITALS — BP 124/78 | Ht 64.0 in | Wt 250.0 lb

## 2018-05-21 DIAGNOSIS — N83202 Unspecified ovarian cyst, left side: Secondary | ICD-10-CM

## 2018-05-21 DIAGNOSIS — Z1272 Encounter for screening for malignant neoplasm of vagina: Secondary | ICD-10-CM | POA: Diagnosis present

## 2018-05-21 DIAGNOSIS — N83201 Unspecified ovarian cyst, right side: Secondary | ICD-10-CM

## 2018-05-21 DIAGNOSIS — C519 Malignant neoplasm of vulva, unspecified: Secondary | ICD-10-CM

## 2018-05-21 DIAGNOSIS — N907 Vulvar cyst: Secondary | ICD-10-CM

## 2018-05-22 ENCOUNTER — Ambulatory Visit
Admission: RE | Admit: 2018-05-22 | Discharge: 2018-05-22 | Disposition: A | Discharge status: Home / Self Care | Payer: Medicare Other | Source: Ambulatory Visit | Attending: Radiation Oncology | Admitting: Radiation Oncology

## 2018-05-22 ENCOUNTER — Encounter: Payer: Self-pay | Admitting: *Deleted

## 2018-05-22 DIAGNOSIS — Z510 Encounter for antineoplastic radiation therapy: Secondary | ICD-10-CM | POA: Insufficient documentation

## 2018-05-22 DIAGNOSIS — C3432 Malignant neoplasm of lower lobe, left bronchus or lung: Secondary | ICD-10-CM | POA: Diagnosis not present

## 2018-05-22 NOTE — Progress Notes (Signed)
  Oncology Nurse Navigator Documentation  Navigator Location: [facility] (05/22/18 1300)   )Navigator Encounter Type: Treatment (05/22/18 1300)                   Treatment Initiated Date: 05/22/18 (05/22/18 1300) Patient Visit Type: QMVHQI (05/22/18 1300) Treatment Phase: First Radiation Tx (05/22/18 1300) Barriers/Navigation Needs: No barriers at this time (05/22/18 1300)   Interventions: None required (05/22/18 1300)                      Time Spent with Patient: 15 (05/22/18 1300)

## 2018-05-23 NOTE — Progress Notes (Signed)
Called and discussed wth patient on the phone.

## 2018-05-24 ENCOUNTER — Ambulatory Visit
Admission: RE | Admit: 2018-05-24 | Discharge: 2018-05-24 | Disposition: A | Discharge status: Home / Self Care | Payer: Medicare Other | Source: Ambulatory Visit | Attending: Obstetrics and Gynecology | Admitting: Obstetrics and Gynecology

## 2018-05-24 ENCOUNTER — Ambulatory Visit
Admission: RE | Admit: 2018-05-24 | Discharge: 2018-05-24 | Disposition: A | Discharge status: Home / Self Care | Payer: Medicare Other | Source: Ambulatory Visit | Attending: Radiation Oncology | Admitting: Radiation Oncology

## 2018-05-24 DIAGNOSIS — N83292 Other ovarian cyst, left side: Secondary | ICD-10-CM | POA: Insufficient documentation

## 2018-05-24 DIAGNOSIS — Z90710 Acquired absence of both cervix and uterus: Secondary | ICD-10-CM | POA: Diagnosis not present

## 2018-05-24 DIAGNOSIS — N83201 Unspecified ovarian cyst, right side: Secondary | ICD-10-CM

## 2018-05-24 DIAGNOSIS — N83202 Unspecified ovarian cyst, left side: Secondary | ICD-10-CM | POA: Diagnosis present

## 2018-05-24 DIAGNOSIS — C3432 Malignant neoplasm of lower lobe, left bronchus or lung: Secondary | ICD-10-CM | POA: Diagnosis not present

## 2018-05-24 LAB — CERVICOVAGINAL ANCILLARY ONLY: HPV (WINDOPATH): NOT DETECTED

## 2018-05-25 ENCOUNTER — Other Ambulatory Visit: Payer: Medicare Other

## 2018-05-25 ENCOUNTER — Other Ambulatory Visit: Payer: Self-pay | Admitting: Obstetrics and Gynecology

## 2018-05-25 DIAGNOSIS — N83299 Other ovarian cyst, unspecified side: Secondary | ICD-10-CM

## 2018-05-25 NOTE — Progress Notes (Signed)
Called and discussed result with patient- will obtain CEA and ROMA, will return to office next week to discuss results and plan of care. May need removal of bilateral ovaries.

## 2018-05-26 LAB — OVARIAN MALIGNANCY RISK-ROMA
Cancer Antigen (CA) 125: 6.9 U/mL (ref 0.0–38.1)
HE4: 62 pmol/L (ref 0.0–96.5)
PREMENOPAUSAL ROMA: 1.13
Postmenopausal ROMA: 0.84

## 2018-05-26 LAB — CEA: CEA1: 3.9 ng/mL (ref 0.0–4.7)

## 2018-05-26 LAB — PREMENOPAUSAL INTERP: LOW

## 2018-05-26 LAB — POSTMENOPAUSAL INTERP: LOW

## 2018-05-29 ENCOUNTER — Ambulatory Visit
Admission: RE | Admit: 2018-05-29 | Discharge: 2018-05-29 | Disposition: A | Discharge status: Home / Self Care | Payer: Medicare Other | Source: Ambulatory Visit | Attending: Radiation Oncology | Admitting: Radiation Oncology

## 2018-05-29 DIAGNOSIS — C3432 Malignant neoplasm of lower lobe, left bronchus or lung: Secondary | ICD-10-CM | POA: Diagnosis not present

## 2018-05-30 ENCOUNTER — Ambulatory Visit: Payer: Medicare Other | Admitting: Obstetrics and Gynecology

## 2018-05-30 ENCOUNTER — Encounter: Payer: Self-pay | Admitting: Obstetrics and Gynecology

## 2018-05-30 VITALS — BP 142/80 | HR 107 | Ht 64.5 in | Wt 246.0 lb

## 2018-05-30 DIAGNOSIS — N83201 Unspecified ovarian cyst, right side: Secondary | ICD-10-CM

## 2018-05-30 DIAGNOSIS — N83202 Unspecified ovarian cyst, left side: Secondary | ICD-10-CM

## 2018-05-30 DIAGNOSIS — N83299 Other ovarian cyst, unspecified side: Secondary | ICD-10-CM

## 2018-05-30 NOTE — Progress Notes (Signed)
Patient ID: Michelle Pitts, female   DOB: [birth year], 67 y.o.   MRN: [id number]  Reason for Consult: Follow-up (No pain.)   Referred by Mar Daring, P*  Subjective:     HPI:  Michelle Pitts is a 67 y.o. female . She is following up after her pelvic US  Gynecological History  No LMP recorded. Patient has had a hysterectomy.   History of fibroids, polyps, or ovarian cysts? : no  History of PCOS? no Hstory of Endometriosis? no History of abnormal pap smears? no Have you had any sexually transmitted infections in the past? no   Past Medical History:  Diagnosis Date  . Anemia   . Anxiety   . Breast cancer ([place]) 01/23/2014   Left breast, T1c, N0, M0. ER/PR +; Her 2 neu not overexpressed. Oncotype DX: 13,Low risk.9% over 10 years.   . Cancer ([place]) 1995   vulvar, [facility]  . COPD (chronic obstructive pulmonary disease) (HCC)    bronchitis  . Dyspnea    with exertion  . Dysrhythmia   . GERD (gastroesophageal reflux disease)   . History of colon polyps   . Hypercholesterolemia   . Hypertension   . Hypothyroidism   . Insomnia   . Lung cancer ([place])   . 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 Personal history of radiation therapy   . Personal history of tobacco use, presenting hazards to health 11/18/2015  . Sleep apnea    NO CPAP  . Sleep apnea   . SOB (shortness of breath) on exertion   . Spinal stenosis   . Thyroid disease   . Vitamin D deficiency    Family History  Problem Relation Age of Onset  . Diabetes Father   . COPD Mother   . Heart failure Sister   . Emphysema Sister   . Diabetes Sister   . Hypertension Sister   . Emphysema Brother   . Lung cancer Brother   . Crohn's disease Sister   . Hypertension Sister   . Hypertension Sister   . Emphysema Brother   . Cancer Other        great great paternal aunt, ? age  . Cancer Maternal Grandfather 49       ? source  . Cancer Paternal Grandfather 42       ? source  . Breast cancer Neg Hx    Past  Surgical History:  Procedure Laterality Date  . BLADDER SUSPENSION  2001  . BREAST BIOPSY Right   . BREAST BIOPSY Left   . BREAST CYST ASPIRATION Left 1985  . BREAST EXCISIONAL BIOPSY Right 1975   x 2  . BREAST SURGERY Left 01/23/2014   T1c, N0; Er/ PR positive, Her 2 negative   2 years anti-estrogen RX. Oncotype DX: 13,Low risk.9% over 10 years  . BREAST SURGERY Left 04/10/2014   Debridement of fat necrosis, left breast cancer wide excision site.   . CHOLECYSTECTOMY    . COLONOSCOPY  2014   Dr Tiffany Kocher  . COLONOSCOPY WITH PROPOFOL N/A 10/10/2016   Procedure: COLONOSCOPY WITH PROPOFOL;  Surgeon: Manya Silvas, MD;  Location: [facility] ENDOSCOPY;  Service: Endoscopy;  Laterality: N/A;  . ELECTROMAGNETIC NAVIGATION BROCHOSCOPY N/A 04/12/2018   Procedure: ELECTROMAGNETIC NAVIGATION BRONCHOSCOPY;  Surgeon: Flora Lipps, MD;  Location: [facility] ORS;  Service: Cardiopulmonary;  Laterality: N/A;  . LAPAROSCOPIC BILATERAL SALPINGO OOPHERECTOMY Bilateral 06/27/2018   Procedure: LAPAROSCOPIC BILATERAL SALPINGO OOPHORECTOMY;  Surgeon: Homero Fellers, MD;  Location: [facility] ORS;  Service: Gynecology;  Laterality: Bilateral;  . ROTATOR CUFF REPAIR Right 1995  . VAGINAL HYSTERECTOMY  1978   partial  . VULVECTOMY PARTIAL  1995  . wide excision debriedment Left 04/10/14    Short Social History:  Social History   Tobacco Use  . Smoking status: Former Smoker    Packs/day: 1.00    Years: 50.00    Pack years: 50.00    Types: Cigarettes    Quit date: 09/15/2014    Years since quitting: 6.2  . Smokeless tobacco: Never Used  Substance Use Topics  . Alcohol use: Yes    Alcohol/week: 7.0 standard drinks    Types: 7 Cans of beer per week    Comment: usually beer, occasionally wine. Daily    Allergies  Allergen Reactions  . Codeine Nausea And Vomiting  . Gramineae Pollens     sinus  . Hydrochlorothiazide Swelling and Other (See Comments)    Swelling of tongue.  Mack Hook [Levofloxacin In D5w]  Hives, Swelling and Other (See Comments)    Leg swelling     Current Outpatient Medications  Medication Sig Dispense Refill  . albuterol (VENTOLIN HFA) 108 (90 Base) MCG/ACT inhaler Inhale 1-2 puffs into the lungs every 6 (six) hours as needed for wheezing or shortness of breath.    . ALPRAZolam (XANAX) 0.5 MG tablet Take 1 tablet (0.5 mg total) by mouth at bedtime as needed for anxiety. 30 tablet 5  . Cholecalciferol (VITAMIN D3) 50 MCG (2000 UT) TABS Take 2,000 Units by mouth daily.    Marland Kitchen ezetimibe (ZETIA) 10 MG tablet Take 1 tablet (10 mg total) by mouth daily. 30 tablet 11  . fluticasone (FLONASE) 50 MCG/ACT nasal spray Place 2 sprays into both nostrils daily. 16 g 6  . Fluticasone-Umeclidin-Vilant (TRELEGY ELLIPTA) 100-62.5-25 MCG/INH AEPB Inhale 1 puff into the lungs daily.    Marland Kitchen gabapentin (NEURONTIN) 300 MG capsule Take 1 capsule (300 mg total) by mouth 2 (two) times daily as needed. For pain (Patient taking differently: Take 300 mg by mouth in the morning.) 180 capsule 1  . ibuprofen (ADVIL) 600 MG tablet TAKE 1 TABLET(600 MG) BY MOUTH EVERY 8 HOURS AS NEEDED (Patient taking differently: Take 600 mg by mouth every 8 (eight) hours as needed for moderate pain.) 90 tablet 1  . metoprolol succinate (TOPROL-XL) 25 MG 24 hr tablet Take 1 tablet (25 mg total) by mouth daily. 90 tablet 3   No current facility-administered medications for this visit.    REVIEW OF SYSTEMS      Objective:  Objective   Vitals:   05/30/18 1330  BP: (!) 142/80  Pulse: (!) 107  Weight: 246 lb (111.6 kg)  Height: 5' 4.5" (1.638 m)   Body mass index is 41.57 kg/m.  Physical Exam  Assessment/Plan:     67 yo with bilateral complex ovarian cysts Will perform Laparoscopic BSO  Adrian Prows MD [place] 05/30/18 2:22 PM

## 2018-05-31 ENCOUNTER — Telehealth: Payer: Self-pay | Admitting: Obstetrics and Gynecology

## 2018-05-31 ENCOUNTER — Ambulatory Visit
Admission: RE | Admit: 2018-05-31 | Discharge: 2018-05-31 | Disposition: A | Discharge status: Home / Self Care | Payer: Medicare Other | Source: Ambulatory Visit | Attending: Radiation Oncology | Admitting: Radiation Oncology

## 2018-05-31 DIAGNOSIS — C3432 Malignant neoplasm of lower lobe, left bronchus or lung: Secondary | ICD-10-CM | POA: Diagnosis not present

## 2018-05-31 NOTE — Telephone Encounter (Signed)
Clearance request faxed to [facility].

## 2018-05-31 NOTE — Telephone Encounter (Signed)
Patient is aware of H&P at [facility] on 06/18/18 @ 1:30pm w/ Dr Gilman Schmidt, Pre-admit Testing afterwards, and OR on 06/27/18, provided there is OR availability. Patient is aware she may receive calls from the [facility]. Patient confirmed [facility] Medicare, and no secondary insurance. Ext given.

## 2018-05-31 NOTE — Progress Notes (Signed)
Discussed with patient if office.

## 2018-06-01 NOTE — Telephone Encounter (Signed)
Dr. Gennette Pac schedule has been blocked. I have rescheduled the H&P and Clarise Cruz will reschedule the annual.

## 2018-06-04 NOTE — Telephone Encounter (Signed)
Clearance received from Fenton Malling, PA-C at [facility], low risk.

## 2018-06-05 ENCOUNTER — Ambulatory Visit
Admission: RE | Admit: 2018-06-05 | Discharge: 2018-06-05 | Disposition: A | Discharge status: Home / Self Care | Payer: Medicare Other | Source: Ambulatory Visit | Attending: Radiation Oncology | Admitting: Radiation Oncology

## 2018-06-05 DIAGNOSIS — C3432 Malignant neoplasm of lower lobe, left bronchus or lung: Secondary | ICD-10-CM | POA: Diagnosis not present

## 2018-06-18 ENCOUNTER — Ambulatory Visit (INDEPENDENT_AMBULATORY_CARE_PROVIDER_SITE_OTHER): Payer: Medicare Other | Admitting: Obstetrics and Gynecology

## 2018-06-18 ENCOUNTER — Encounter: Payer: Self-pay | Admitting: Obstetrics and Gynecology

## 2018-06-18 ENCOUNTER — Other Ambulatory Visit: Payer: Self-pay

## 2018-06-18 ENCOUNTER — Telehealth: Payer: Self-pay

## 2018-06-18 ENCOUNTER — Encounter
Admission: RE | Admit: 2018-06-18 | Discharge: 2018-06-18 | Disposition: A | Discharge status: Home / Self Care | Payer: Medicare Other | Source: Ambulatory Visit | Attending: Obstetrics and Gynecology | Admitting: Obstetrics and Gynecology

## 2018-06-18 VITALS — BP 156/80 | HR 95 | Ht 64.5 in | Wt 251.0 lb

## 2018-06-18 DIAGNOSIS — R9431 Abnormal electrocardiogram [ECG] [EKG]: Secondary | ICD-10-CM | POA: Diagnosis not present

## 2018-06-18 DIAGNOSIS — N83202 Unspecified ovarian cyst, left side: Secondary | ICD-10-CM | POA: Diagnosis not present

## 2018-06-18 DIAGNOSIS — N83201 Unspecified ovarian cyst, right side: Secondary | ICD-10-CM

## 2018-06-18 DIAGNOSIS — Z01818 Encounter for other preprocedural examination: Secondary | ICD-10-CM | POA: Diagnosis not present

## 2018-06-18 DIAGNOSIS — N83299 Other ovarian cyst, unspecified side: Secondary | ICD-10-CM

## 2018-06-18 HISTORY — DX: Malignant neoplasm of unspecified part of unspecified bronchus or lung: C34.90

## 2018-06-18 LAB — COMPREHENSIVE METABOLIC PANEL
ALBUMIN: 4.1 g/dL (ref 3.5–5.0)
ALK PHOS: 118 U/L (ref 38–126)
ALT: 25 U/L (ref 0–44)
AST: 23 U/L (ref 15–41)
Anion gap: 7 (ref 5–15)
BILIRUBIN TOTAL: 0.6 mg/dL (ref 0.3–1.2)
BUN: 21 mg/dL (ref 8–23)
CALCIUM: 9.6 mg/dL (ref 8.9–10.3)
CO2: 29 mmol/L (ref 22–32)
Chloride: 104 mmol/L (ref 98–111)
Creatinine, Ser: 0.84 mg/dL (ref 0.44–1.00)
GFR calc Af Amer: >60 mL/min (ref >60)
GFR calc non Af Amer: >60 mL/min (ref >60)
GLUCOSE: 90 mg/dL (ref 70–99)
Potassium: 4.3 mmol/L (ref 3.5–5.1)
Sodium: 140 mmol/L (ref 135–145)
TOTAL PROTEIN: 7.3 g/dL (ref 6.5–8.1)

## 2018-06-18 LAB — CBC
HEMATOCRIT: 37.3 % (ref 36.0–46.0)
HEMOGLOBIN: 12.2 g/dL (ref 12.0–15.0)
MCH: 31.1 pg (ref 26.0–34.0)
MCHC: 32.7 g/dL (ref 30.0–36.0)
MCV: 95.2 fL (ref 80.0–100.0)
Platelets: 235 10*3/uL (ref 150–400)
RBC: 3.92 MIL/uL (ref 3.87–5.11)
RDW: 13.1 % (ref 11.5–15.5)
WBC: 6.9 10*3/uL (ref 4.0–10.5)
nRBC: 0 % (ref 0.0–0.2)

## 2018-06-18 NOTE — Pre-Procedure Instructions (Addendum)
Abnormal EKG, medical clearance requested. Called Dr Gennette Pac office regarding clearance request.

## 2018-06-18 NOTE — Patient Instructions (Signed)
Your procedure is scheduled on: 06/27/18 Wed Report to Same Day Surgery 2nd floor medical mall [facility] Entrance-take elevator on left to 2nd floor.  Check in with surgery information desk.) To find out your arrival time please call [phone] between 1PM - 3PM on 06/26/18 Tues  Remember: Instructions that are not followed completely may result in serious medical risk, up to and including death, or upon the discretion of your surgeon and anesthesiologist your surgery may need to be rescheduled.    _x___ 1. Do not eat food after midnight the night before your procedure. You may drink clear liquids up to 2 hours before you are scheduled to arrive at the hospital for your procedure.  Do not drink clear liquids within 2 hours of your scheduled arrival to the hospital.  Clear liquids include  --Water or Apple juice without pulp  --Clear carbohydrate beverage such as ClearFast or Gatorade  --Black Coffee or Clear Tea (No milk, no creamers, do not add anything to                  the coffee or Tea Type 1 and type 2 diabetics should only drink water.   ____Ensure clear carbohydrate drink on the way to the hospital for bariatric patients  ____Ensure clear carbohydrate drink 3 hours before surgery for Dr Dwyane Luo patients if physician instructed.   No gum chewing or hard candies.     __x__ 2. No Alcohol for 24 hours before or after surgery.   __x__3. No Smoking or e-cigarettes for 24 prior to surgery.  Do not use any chewable tobacco products for at least 6 hour prior to surgery   ____  4. Bring all medications with you on the day of surgery if instructed.    __x__ 5. Notify your doctor if there is any change in your medical condition     (cold, fever, infections).    x___6. On the morning of surgery brush your teeth with toothpaste and water.  You may rinse your mouth with mouth wash if you wish.  Do not swallow any toothpaste or mouthwash.   Do not wear jewelry, make-up, hairpins,  clips or nail polish.  Do not wear lotions, powders, or perfumes. You may wear deodorant.  Do not shave 48 hours prior to surgery. Men may shave face and neck.  Do not bring valuables to the hospital.    [facility] is not responsible for any belongings or valuables.               Contacts, dentures or bridgework may not be worn into surgery.  Leave your suitcase in the car. After surgery it may be brought to your room.  For patients admitted to the hospital, discharge time is determined by your                       treatment team.  _  Patients discharged the day of surgery will not be allowed to drive home.  You will need someone to drive you home and stay with you the night of your procedure.    Please read over the following fact sheets that you were given:   [facility] Preparing for Surgery and or MRSA Information   _x___ Take anti-hypertensive listed below, cardiac, seizure, asthma,     anti-reflux and psychiatric medicines. These include:  1. ALPRAZolam (XANAX) 0.5 MG tablet if needed  2.metoprolol succinate (TOPROL-XL) 25 MG 24 hr tablet  3.tagamet  4.  5.  6.  ____Fleets enema or Magnesium Citrate as directed.   _x___ Use CHG Soap or sage wipes as directed on instruction sheet   ____ Use inhalers on the day of surgery and bring to hospital day of surgery  ____ Stop Metformin and Janumet 2 days prior to surgery.    ____ Take 1/2 of usual insulin dose the night before surgery and none on the morning     surgery.   _x___ Follow recommendations from Cardiologist, Pulmonologist or PCP regarding          stopping Aspirin, Coumadin, Plavix ,Eliquis, Effient, or Pradaxa, and Pletal.  X____Stop Anti-inflammatories such as Advil, Aleve, Ibuprofen, Motrin, Naproxen, Naprosyn, Goodies powders or aspirin products. OK to take Tylenol and                          Celebrex.   _x___ Stop supplements until after surgery.  But may continue Vitamin D, Vitamin B,       and  multivitamin.   ____ Bring C-Pap to the hospital.

## 2018-06-18 NOTE — Pre-Procedure Instructions (Signed)
Faxed clearance request to Fenton Malling and spoke with office to expect request. Also sent via fax to Dr. Gilman Schmidt.

## 2018-06-18 NOTE — Progress Notes (Signed)
Patient ID: Michelle Pitts, female   DOB: 12-01-1950, 67 y.o.   MRN: [id number]  Reason for Consult: No chief complaint on file.   Referred by Michelle Pitts*  Subjective:     HPI:  Michelle Pitts is a 67 y.o. female she is following up today for a left ovarian cyst. The cyst has been persistent on imaging for more than a month. Her tumor markers are normal but it has concerning features of internal septations and questionable cyst within cysts.   Past Medical History:  Diagnosis Date  . Anxiety   . Breast cancer ([place]) 01/23/2014   Left breast, T1c, N0, M0. ER/PR +; Her 2 neu not overexpressed. Oncotype DX: 13,Low risk.9% over 10 years.   . Cancer ([place]) 1995   vulvar, [facility]  . COPD (chronic obstructive pulmonary disease) (HCC)    bronchitis  . Dysrhythmia   . GERD (gastroesophageal reflux disease)   . History of colon polyps   . Hypercholesterolemia   . Hypertension   . Hypothyroidism   . Insomnia   . 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 Personal history of radiation therapy   . Personal history of tobacco use, presenting hazards to health 11/18/2015  . Sleep apnea   . Sleep apnea   . SOB (shortness of breath) on exertion   . Spinal stenosis   . Thyroid disease   . Vitamin D deficiency    Family History  Problem Relation Age of Onset  . Diabetes Father   . COPD Mother   . Heart failure Sister   . Emphysema Sister   . Diabetes Sister   . Hypertension Sister   . Emphysema Brother   . Lung cancer Brother   . Crohn's disease Sister   . Hypertension Sister   . Hypertension Sister   . Emphysema Brother   . Cancer Other        great great paternal aunt, ? age  . Cancer Maternal Grandfather 11       ? source  . Cancer Paternal Grandfather 40       ? source  . Breast cancer Neg Hx    Past Surgical History:  Procedure Laterality Date  . BLADDER SUSPENSION  2001  . BREAST BIOPSY Right 1975, 1985   benign cyst Michelle Pitts) and  fibroadenoma([facility])  . BREAST BIOPSY Right 01/03/2014   Papilloma, no atypia detected on mammography.  Michelle Pitts BREAST BIOPSY Left 01/03/2014   Invasive mammary carcinoma.  Michelle Pitts BREAST BIOPSY Left 01/13/2015   ultrasound giuded biopsy, negative  . BREAST CYST ASPIRATION Left 1985  . BREAST LUMPECTOMY Left 01/23/2014   positive  . BREAST SURGERY Left 01/23/14   wide excision   . BREAST SURGERY  04/10/14   Debridement of fat necrosis  . CHOLECYSTECTOMY    . COLONOSCOPY  2014   Dr Michelle Pitts  . COLONOSCOPY WITH PROPOFOL N/A 10/10/2016   Procedure: COLONOSCOPY WITH PROPOFOL;  Surgeon: Michelle Silvas, MD;  Location: [facility] ENDOSCOPY;  Service: Endoscopy;  Laterality: N/A;  . ELECTROMAGNETIC NAVIGATION BROCHOSCOPY N/A 04/12/2018   Procedure: ELECTROMAGNETIC NAVIGATION BRONCHOSCOPY;  Surgeon: Michelle Lipps, MD;  Location: [facility] ORS;  Service: Cardiopulmonary;  Laterality: N/A;  . ROTATOR CUFF REPAIR Right 1995  . VAGINAL HYSTERECTOMY  1978   partial  . VULVECTOMY PARTIAL  1995  . wide excision debriedment Left 04/10/14    Short Social History:  Social History   Tobacco Use  . Smoking status: Former Smoker  Packs/day: 1.00    Years: 50.00    Pack years: 50.00    Types: Cigarettes    Last attempt to quit: 09/15/2014    Years since quitting: 3.7  . Smokeless tobacco: Never Used  Substance Use Topics  . Alcohol use: Yes    Alcohol/week: 14.0 standard drinks    Types: 14 Cans of beer per week    Allergies  Allergen Reactions  . Codeine Nausea And Vomiting  . Hydrochlorothiazide Swelling and Other (See Comments)    Swelling of tongue.  Mack Hook [Levofloxacin In D5w] Hives, Swelling and Other (See Comments)    Leg swelling     Current Outpatient Medications  Medication Sig Dispense Refill  . albuterol (PROAIR HFA) 108 (90 Base) MCG/ACT inhaler Inhale 2 puffs into the lungs every 6 (six) hours as needed for wheezing or shortness of breath. 18 g 3  . ALPRAZolam (XANAX) 0.5 MG tablet Take 1  tablet (0.5 mg total) by mouth 2 (two) times daily. As needed. (Patient taking differently: Take 0.5 mg by mouth 2 (two) times daily as needed for anxiety. ) 60 tablet 0  . aspirin EC 81 MG tablet Take 81 mg by mouth daily.    . cholecalciferol (VITAMIN D) 1000 units tablet Take 1 tablet (1,000 Units total) by mouth daily. (Patient not taking: Reported on 06/13/2018) 30 tablet 11  . Fluticasone-Salmeterol (ADVAIR) 250-50 MCG/DOSE AEPB Inhale 1 puff into the lungs 2 (two) times daily. (Patient taking differently: Inhale 1 puff into the lungs as needed. ) 60 each 5  . ketotifen (ZADITOR) 0.025 % ophthalmic solution Place 1 drop into both eyes daily as needed (for allergies).     . metoprolol succinate (TOPROL-XL) 25 MG 24 hr tablet Take 1 tablet (25 mg total) by mouth daily. 90 tablet 1  . naproxen sodium (ALEVE) 220 MG tablet Take 220 mg by mouth daily as needed.    . neomycin-bacitracin-polymyxin (NEOSPORIN) ointment Apply topically daily. (Patient not taking: Reported on 04/30/2018) 15 g 0  . Polyethyl Glycol-Propyl Glycol (SYSTANE) 0.4-0.3 % SOLN Place 1 drop into both eyes daily as needed (for dry eyes).    . ranitidine (ZANTAC) 150 MG tablet Take 150 mg by mouth as needed.      No current facility-administered medications for this visit.     Review of Systems  Constitutional: Negative for chills, fatigue, fever and unexpected weight change.  HENT: Negative for trouble swallowing.  Eyes: Negative for loss of vision.  Respiratory: Negative for cough, shortness of breath and wheezing.  Cardiovascular: Negative for chest pain, leg swelling, palpitations and syncope.  GI: Negative for abdominal pain, blood in stool, diarrhea, nausea and vomiting.  GU: Negative for difficulty urinating, dysuria, frequency and hematuria.  Musculoskeletal: Negative for back pain, leg pain and joint pain.  Skin: Negative for rash.  Neurological: Negative for dizziness, headaches, light-headedness, numbness and  seizures.  Psychiatric: Negative for behavioral problem, confusion, depressed mood and sleep disturbance.        Objective:  Objective   There were no vitals filed for this visit. There is no height or weight on file to calculate BMI.  Physical Exam  Constitutional: She is oriented to person, place, and time. She appears well-developed and well-nourished.  HENT:  Head: Normocephalic and atraumatic.  Eyes: Pupils are equal, round, and reactive to light. EOM are normal.  Cardiovascular: Normal rate and regular rhythm.  Pulmonary/Chest: Effort normal. No respiratory distress.  Neurological: She is alert and oriented to  person, place, and time.  Skin: Skin is warm and dry.  Psychiatric: She has a normal mood and affect. Her behavior is normal. Judgment and thought content normal.  Nursing note and vitals reviewed.        Assessment/Plan:    67 yo with left ovarian cyst, persistent for mor than 2 years. Stable in size, normal ovarian tumor markers. Will proceed with laparoscopic removal of bilateral ovaries and fallopian tubes. Surgical risks discussed in detail with the patient including infection, bleeding, and damage to pelvic organs close to the surgery site.    More than 25 minutes were spent face to face with the patient in the room with more than 50% of the time spent providing counseling and discussing the plan of management. We discussed risks and benefits of the procedure.Postoperative expectations discussed.  All questions answered.       Adrian Prows MD [facility] 06/18/18 1:58 PM

## 2018-06-18 NOTE — Telephone Encounter (Signed)
Thank you, I am aware.

## 2018-06-18 NOTE — Telephone Encounter (Signed)
Michelle Pitts w/Pre Admit Testing calling to notify pt had abnormal EKG & is being sent for clearance. Information has been faxed for your review. [phone]

## 2018-06-18 NOTE — H&P (View-Only) (Signed)
Patient ID: Michelle Pitts, female   DOB: 10-18-1950, 67 y.o.   MRN: [id number]  Reason for Consult: No chief complaint on file.   Referred by Florian Buff*  Subjective:     HPI:  Michelle Pitts is a 67 y.o. female she is following up today for a left ovarian cyst. The cyst has been persistent on imaging for more than a month. Her tumor markers are normal but it has concerning features of internal septations and questionable cyst within cysts.   Past Medical History:  Diagnosis Date  . Anxiety   . Breast cancer ([place]) 01/23/2014   Left breast, T1c, N0, M0. ER/PR +; Her 2 neu not overexpressed. Oncotype DX: 13,Low risk.9% over 10 years.   . Cancer ([place]) 1995   vulvar, [facility]  . COPD (chronic obstructive pulmonary disease) (HCC)    bronchitis  . Dysrhythmia   . GERD (gastroesophageal reflux disease)   . History of colon polyps   . Hypercholesterolemia   . Hypertension   . Hypothyroidism   . Insomnia   . 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 Personal history of radiation therapy   . Personal history of tobacco use, presenting hazards to health 11/18/2015  . Sleep apnea   . Sleep apnea   . SOB (shortness of breath) on exertion   . Spinal stenosis   . Thyroid disease   . Vitamin D deficiency    Family History  Problem Relation Age of Onset  . Diabetes Father   . COPD Mother   . Heart failure Sister   . Emphysema Sister   . Diabetes Sister   . Hypertension Sister   . Emphysema Brother   . Lung cancer Brother   . Crohn's disease Sister   . Hypertension Sister   . Hypertension Sister   . Emphysema Brother   . Cancer Other        great great paternal aunt, ? age  . Cancer Maternal Grandfather 56       ? source  . Cancer Paternal Grandfather 79       ? source  . Breast cancer Neg Hx    Past Surgical History:  Procedure Laterality Date  . BLADDER SUSPENSION  2001  . BREAST BIOPSY Right 1975, 1985   benign cyst [name]) and  fibroadenoma([facility])  . BREAST BIOPSY Right 01/03/2014   Papilloma, no atypia detected on mammography.  Marland Kitchen BREAST BIOPSY Left 01/03/2014   Invasive mammary carcinoma.  Marland Kitchen BREAST BIOPSY Left 01/13/2015   ultrasound giuded biopsy, negative  . BREAST CYST ASPIRATION Left 1985  . BREAST LUMPECTOMY Left 01/23/2014   positive  . BREAST SURGERY Left 01/23/14   wide excision   . BREAST SURGERY  04/10/14   Debridement of fat necrosis  . CHOLECYSTECTOMY    . COLONOSCOPY  2014   Dr Tiffany Kocher  . COLONOSCOPY WITH PROPOFOL N/A 10/10/2016   Procedure: COLONOSCOPY WITH PROPOFOL;  Surgeon: Manya Silvas, MD;  Location: [facility] ENDOSCOPY;  Service: Endoscopy;  Laterality: N/A;  . ELECTROMAGNETIC NAVIGATION BROCHOSCOPY N/A 04/12/2018   Procedure: ELECTROMAGNETIC NAVIGATION BRONCHOSCOPY;  Surgeon: Flora Lipps, MD;  Location: [facility] ORS;  Service: Cardiopulmonary;  Laterality: N/A;  . ROTATOR CUFF REPAIR Right 1995  . VAGINAL HYSTERECTOMY  1978   partial  . VULVECTOMY PARTIAL  1995  . wide excision debriedment Left 04/10/14    Short Social History:  Social History   Tobacco Use  . Smoking status: Former Smoker  Packs/day: 1.00    Years: 50.00    Pack years: 50.00    Types: Cigarettes    Last attempt to quit: 09/15/2014    Years since quitting: 3.7  . Smokeless tobacco: Never Used  Substance Use Topics  . Alcohol use: Yes    Alcohol/week: 14.0 standard drinks    Types: 14 Cans of beer per week    Allergies  Allergen Reactions  . Codeine Nausea And Vomiting  . Hydrochlorothiazide Swelling and Other (See Comments)    Swelling of tongue.  [place] [Levofloxacin In D5w] Hives, Swelling and Other (See Comments)    Leg swelling     Current Outpatient Medications  Medication Sig Dispense Refill  . albuterol (PROAIR HFA) 108 (90 Base) MCG/ACT inhaler Inhale 2 puffs into the lungs every 6 (six) hours as needed for wheezing or shortness of breath. 18 g 3  . ALPRAZolam (XANAX) 0.5 MG tablet Take 1  tablet (0.5 mg total) by mouth 2 (two) times daily. As needed. (Patient taking differently: Take 0.5 mg by mouth 2 (two) times daily as needed for anxiety. ) 60 tablet 0  . aspirin EC 81 MG tablet Take 81 mg by mouth daily.    . cholecalciferol (VITAMIN D) 1000 units tablet Take 1 tablet (1,000 Units total) by mouth daily. (Patient not taking: Reported on 06/13/2018) 30 tablet 11  . Fluticasone-Salmeterol (ADVAIR) 250-50 MCG/DOSE AEPB Inhale 1 puff into the lungs 2 (two) times daily. (Patient taking differently: Inhale 1 puff into the lungs as needed. ) 60 each 5  . ketotifen (ZADITOR) 0.025 % ophthalmic solution Place 1 drop into both eyes daily as needed (for allergies).     . metoprolol succinate (TOPROL-XL) 25 MG 24 hr tablet Take 1 tablet (25 mg total) by mouth daily. 90 tablet 1  . naproxen sodium (ALEVE) 220 MG tablet Take 220 mg by mouth daily as needed.    . neomycin-bacitracin-polymyxin (NEOSPORIN) ointment Apply topically daily. (Patient not taking: Reported on 04/30/2018) 15 g 0  . Polyethyl Glycol-Propyl Glycol (SYSTANE) 0.4-0.3 % SOLN Place 1 drop into both eyes daily as needed (for dry eyes).    . ranitidine (ZANTAC) 150 MG tablet Take 150 mg by mouth as needed.      No current facility-administered medications for this visit.     Review of Systems  Constitutional: Negative for chills, fatigue, fever and unexpected weight change.  HENT: Negative for trouble swallowing.  Eyes: Negative for loss of vision.  Respiratory: Negative for cough, shortness of breath and wheezing.  Cardiovascular: Negative for chest pain, leg swelling, palpitations and syncope.  GI: Negative for abdominal pain, blood in stool, diarrhea, nausea and vomiting.  GU: Negative for difficulty urinating, dysuria, frequency and hematuria.  Musculoskeletal: Negative for back pain, leg pain and joint pain.  Skin: Negative for rash.  Neurological: Negative for dizziness, headaches, light-headedness, numbness and  seizures.  Psychiatric: Negative for behavioral problem, confusion, depressed mood and sleep disturbance.        Objective:  Objective   There were no vitals filed for this visit. There is no height or weight on file to calculate BMI.  Physical Exam  Constitutional: She is oriented to person, place, and time. She appears well-developed and well-nourished.  HENT:  Head: Normocephalic and atraumatic.  Eyes: Pupils are equal, round, and reactive to light. EOM are normal.  Cardiovascular: Normal rate and regular rhythm.  Pulmonary/Chest: Effort normal. No respiratory distress.  Neurological: She is alert and oriented to  person, place, and time.  Skin: Skin is warm and dry.  Psychiatric: She has a normal mood and affect. Her behavior is normal. Judgment and thought content normal.  Nursing note and vitals reviewed.        Assessment/Plan:    67 yo with left ovarian cyst, persistent for mor than 2 years. Stable in size, normal ovarian tumor markers. Will proceed with laparoscopic removal of bilateral ovaries and fallopian tubes. Surgical risks discussed in detail with the patient including infection, bleeding, and damage to pelvic organs close to the surgery site.    More than 25 minutes were spent face to face with the patient in the room with more than 50% of the time spent providing counseling and discussing the plan of management. We discussed risks and benefits of the procedure.Postoperative expectations discussed.  All questions answered.       Adrian Prows MD [facility] 06/18/18 1:58 PM

## 2018-06-20 ENCOUNTER — Encounter: Payer: Self-pay | Admitting: Physician Assistant

## 2018-06-20 ENCOUNTER — Telehealth: Payer: Self-pay | Admitting: Physician Assistant

## 2018-06-20 DIAGNOSIS — Z01810 Encounter for preprocedural cardiovascular examination: Secondary | ICD-10-CM

## 2018-06-20 DIAGNOSIS — J9819 Other pulmonary collapse: Secondary | ICD-10-CM

## 2018-06-20 DIAGNOSIS — R9431 Abnormal electrocardiogram [ECG] [EKG]: Secondary | ICD-10-CM

## 2018-06-20 DIAGNOSIS — R911 Solitary pulmonary nodule: Secondary | ICD-10-CM

## 2018-06-20 NOTE — Telephone Encounter (Signed)
LMTCB

## 2018-06-20 NOTE — Telephone Encounter (Signed)
Patient advised as directed below. Patient was also advised that she is scheduled with Dr Fletcher Anon 06/21/18 at 4:00.Left message on home voicemail with appointment details Address: [address], [place], [place], [place] [address] Phone: [phone]  Telephone number was provided for patient to call the office and confirm the appointment.  Thanks,  -Joseline

## 2018-06-20 NOTE — Telephone Encounter (Signed)
See Telephone encounter.

## 2018-06-20 NOTE — Telephone Encounter (Signed)
Pre op clearance form received due to abnormal EKG. I would recommend for her to be evaluated by cardiology before her surgery for clearance, especially since she recently underwent the lung biopsies and had all that and now has some changes from previous EKG through that time. I can place referral to cardiology for this to be done asap.

## 2018-06-21 ENCOUNTER — Encounter: Payer: Self-pay | Admitting: Cardiovascular Disease

## 2018-06-21 ENCOUNTER — Ambulatory Visit (INDEPENDENT_AMBULATORY_CARE_PROVIDER_SITE_OTHER): Payer: Medicare Other | Admitting: Cardiovascular Disease

## 2018-06-21 ENCOUNTER — Ambulatory Visit: Payer: Medicare Other | Admitting: Cardiovascular Disease

## 2018-06-21 VITALS — BP 128/80 | HR 86 | Ht 64.5 in | Wt 248.0 lb

## 2018-06-21 VITALS — BP 128/80 | HR 86 | Wt 248.0 lb

## 2018-06-21 DIAGNOSIS — R0602 Shortness of breath: Secondary | ICD-10-CM

## 2018-06-21 DIAGNOSIS — R9431 Abnormal electrocardiogram [ECG] [EKG]: Secondary | ICD-10-CM

## 2018-06-21 DIAGNOSIS — I2510 Atherosclerotic heart disease of native coronary artery without angina pectoris: Secondary | ICD-10-CM | POA: Diagnosis not present

## 2018-06-21 DIAGNOSIS — J432 Centrilobular emphysema: Secondary | ICD-10-CM

## 2018-06-21 DIAGNOSIS — Z01810 Encounter for preprocedural cardiovascular examination: Secondary | ICD-10-CM

## 2018-06-21 DIAGNOSIS — C3490 Malignant neoplasm of unspecified part of unspecified bronchus or lung: Secondary | ICD-10-CM | POA: Diagnosis not present

## 2018-06-21 DIAGNOSIS — I700 Atherosclerosis of aorta: Secondary | ICD-10-CM | POA: Diagnosis not present

## 2018-06-21 NOTE — Patient Instructions (Addendum)
Medication Instructions:   Please stop the aspirin  If you need a refill on your cardiac medications before your next appointment, please call your pharmacy.    Lab work: No new labs needed   If you have labs (blood work) drawn today and your tests are completely normal, you will receive your results only by: Marland Kitchen MyChart Message (if you have MyChart) OR . A paper copy in the mail If you have any lab test that is abnormal or we need to change your treatment, we will call you to review the results.   Testing/Procedures: No new testing needed   Follow-Up: At [facility], you and your health needs are our priority.  As part of our continuing mission to provide you with exceptional heart care, we have created designated Provider Care Teams.  These Care Teams include your primary Cardiologist (physician) and Advanced Practice Providers (APPs -  Physician Assistants and Nurse Practitioners) who all work together to provide you with the care you need, when you need it.  . You will need a follow up appointment as needed   . Providers on your designated Care Team:   . Murray Hodgkins, NP . Christell Faith, PA-C . Marrianne Mood, PA-C  Any Other Special Instructions Will Be Listed Below (If Applicable).  For educational health videos Log in to : [email or web address] Or : [email or web address], password : [email or web address]

## 2018-06-21 NOTE — Progress Notes (Signed)
The patient was seen by Dr. Rockey Situ.

## 2018-06-21 NOTE — Progress Notes (Addendum)
Cardiology Office Note  Date:  06/21/2018   ID:  Michelle Pitts, DOB 30-Apr-1951, MRN [id number]  PCP:  Michelle Daring, PA-C    Chief complaint Shortness of breath, abnormal EKG, preop cardiovascular evaluation  HPI:  [name] is a 67 year old woman with past medical history of Lung cancer Smoker Morbid obesity Referred by Dr. Yvette Pitts for abnormal EKG, preoperative cardiovascular evaluation  Sedentary lifestyle, poor diet, Chronic mild shortness of breath but no significant change over the past year or so Denies any significant chest pain Able to achieve at least 4-5 METS  Discussed prior history with her including various types of cancer including lung cancer requiring radiation treatment  Scheduled  to have both ovaries removed next Wednesday November 13th Laparoscopic  CT scan April 2019 reviewed with her in detail, images pulled up in the office Minimal aortic atherosclerosis noted in the aortic arch Very mild coronary calcification noted in the proximal LAD No significant plaque noted in the carotids  EKG personally reviewed by myself on todays visit Shows normal sinus rhythm with no significant ST or T wave changes Prior EKGs reviewed dating back to 2016 Prior abnormal EKGs likely secondary to lead placement issues, all look relatively normal   PMH:   has a past medical history of Anxiety, Breast cancer ([place]) (01/23/2014), Cancer ([place]) (1995), COPD (chronic obstructive pulmonary disease) ([place]), Dysrhythmia, GERD (gastroesophageal reflux disease), History of colon polyps, Hypercholesterolemia, Hypertension, Hypothyroidism, Insomnia, Lung cancer ([place]), Papilloma of breast (01/09/2014), Personal history of radiation therapy, Personal history of tobacco use, presenting hazards to health (11/18/2015), Sleep apnea, Sleep apnea, SOB (shortness of breath) on exertion, Spinal stenosis, Thyroid disease, and Vitamin D deficiency.  PSH:    Past Surgical  History:  Procedure Laterality Date  . BLADDER SUSPENSION  2001  . BREAST BIOPSY Right 1975, 1985   benign cyst [name]) and fibroadenoma([facility])  . BREAST BIOPSY Right 01/03/2014   Papilloma, no atypia detected on mammography.  Marland Kitchen BREAST BIOPSY Left 01/03/2014   Invasive mammary carcinoma.  Marland Kitchen BREAST BIOPSY Left 01/13/2015   ultrasound giuded biopsy, negative  . BREAST CYST ASPIRATION Left 1985  . BREAST LUMPECTOMY Left 01/23/2014   positive  . BREAST SURGERY Left 01/23/14   wide excision   . BREAST SURGERY  04/10/14   Debridement of fat necrosis  . CHOLECYSTECTOMY    . COLONOSCOPY  2014   Dr Tiffany Kocher  . COLONOSCOPY WITH PROPOFOL N/A 10/10/2016   Procedure: COLONOSCOPY WITH PROPOFOL;  Surgeon: Manya Silvas, MD;  Location: [facility] ENDOSCOPY;  Service: Endoscopy;  Laterality: N/A;  . ELECTROMAGNETIC NAVIGATION BROCHOSCOPY N/A 04/12/2018   Procedure: ELECTROMAGNETIC NAVIGATION BRONCHOSCOPY;  Surgeon: Flora Lipps, MD;  Location: [facility] ORS;  Service: Cardiopulmonary;  Laterality: N/A;  . ROTATOR CUFF REPAIR Right 1995  . VAGINAL HYSTERECTOMY  1978   partial  . VULVECTOMY PARTIAL  1995  . wide excision debriedment Left 04/10/14    Current Outpatient Medications  Medication Sig Dispense Refill  . albuterol (PROAIR HFA) 108 (90 Base) MCG/ACT inhaler Inhale 2 puffs into the lungs every 6 (six) hours as needed for wheezing or shortness of breath. (Patient not taking: Reported on 06/21/2018) 18 g 3  . ALPRAZolam (XANAX) 0.5 MG tablet Take 1 tablet (0.5 mg total) by mouth 2 (two) times daily. As needed. (Patient not taking: Reported on 06/21/2018) 60 tablet 0  . cholecalciferol (VITAMIN D) 1000 units tablet Take 1 tablet (1,000 Units total) by mouth daily. 30 tablet 11  .  cimetidine (TAGAMET) 200 MG tablet Take 200 mg by mouth daily as needed.    . metoprolol succinate (TOPROL-XL) 25 MG 24 hr tablet Take 1 tablet (25 mg total) by mouth daily. 90 tablet 1  . Polyethyl Glycol-Propyl Glycol  (SYSTANE) 0.4-0.3 % SOLN Place 1 drop into both eyes daily as needed (for dry eyes).     No current facility-administered medications for this visit.      Allergies:   Codeine; Hydrochlorothiazide; and Levaquin [levofloxacin in d5w]   Social History:  The patient  reports that she quit smoking about 3 years ago. Her smoking use included cigarettes. She has a 50.00 pack-year smoking history. She has never used smokeless tobacco. She reports that she drinks about 14.0 standard drinks of alcohol per week. She reports that she does not use drugs.   Family History:   family history includes COPD in her mother; Cancer in her other; Cancer (age of onset: 32) in her maternal grandfather; Cancer (age of onset: 62) in her paternal grandfather; Crohn's disease in her sister; Diabetes in her father and sister; Emphysema in her brother, brother, and sister; Heart failure in her sister; Hypertension in her sister, sister, and sister; Lung cancer in her brother.    Review of Systems: Review of Systems  Constitutional: Negative.   Respiratory: Negative.   Cardiovascular: Negative.   Gastrointestinal: Negative.   Musculoskeletal: Negative.   Neurological: Negative.   Psychiatric/Behavioral: Negative.   All other systems reviewed and are negative.   PHYSICAL EXAM: VS:  BP 128/80   Pulse 86   Wt 248 lb (112.5 kg)   BMI 41.91 kg/m  , BMI Body mass index is 41.91 kg/m. GEN: Well nourished, well developed, in no acute distress , obese HEENT: normal  Neck: no JVD, carotid bruits, or masses Cardiac: RRR; no murmurs, rubs, or gallops,no edema  Respiratory:  clear to auscultation bilaterally, normal work of breathing GI: soft, nontender, nondistended, + BS MS: no deformity or atrophy  Skin: warm and dry, no rash Neuro:  Strength and sensation are intact Psych: euthymic mood, full affect    Recent Labs: 02/14/2018: TSH 3.520 06/18/2018: ALT 25; BUN 21; Creatinine, Ser 0.84; Hemoglobin 12.2;  Platelets 235; Potassium 4.3; Sodium 140    Lipid Panel Lab Results  Component Value Date   CHOL 218 (H) 02/14/2018   HDL 79 02/14/2018   LDLCALC 111 (H) 02/14/2018   TRIG 138 02/14/2018      Wt Readings from Last 3 Encounters:  06/21/18 248 lb (112.5 kg)  06/21/18 248 lb (112.5 kg)  06/18/18 245 lb (111.1 kg)       ASSESSMENT AND PLAN:  Abnormal EKG - Plan: EKG 12-Lead Benign appearing EKG, no further testing needed Prior abnormal EKGs likely secondary to lead placement  Preop cardiovascular evaluation Acceptable risk for upcoming surgery,  LAPAROSCOPIC BILATERAL SALPINGO OOPHORECTOMY No further testing needed  Shortness of breath - Plan: EKG 12-Lead COPD, 30 years of smoking Stopped several years ago No further work-up needed  Malignant neoplasm of lung, unspecified laterality, unspecified part of lung ([place]) Recently finished her radiation for lung cancer Still sluggish, recovering Deconditioned  Centrilobular emphysema (HCC) On albuterol as needed, does not use on a regular basis  Aortic atherosclerosis (HCC) Minimal aortic atherosclerosis noted in the aortic arch She does not need aspirin  Coronary artery calcification seen on CT scan - Plan: EKG 12-Lead Minimal coronary calcification noted in the LAD  Disposition:   F/U as needed   Total  encounter time more than 45 minutes  Greater than 50% was spent in counseling and coordination of care with the patient   No orders of the defined types were placed in this encounter.    Signed, Esmond Plants, M.D., Ph.D. 06/21/2018  [place], [place]

## 2018-06-27 ENCOUNTER — Encounter
Admission: RE | Disposition: A | Discharge status: Home / Self Care | Payer: Self-pay | Source: Ambulatory Visit | Attending: Obstetrics and Gynecology

## 2018-06-27 ENCOUNTER — Ambulatory Visit: Payer: Medicare Other | Admitting: Anesthesiology

## 2018-06-27 ENCOUNTER — Other Ambulatory Visit: Payer: Self-pay

## 2018-06-27 ENCOUNTER — Encounter: Payer: Self-pay | Admitting: *Deleted

## 2018-06-27 ENCOUNTER — Observation Stay
Admission: RE | Admit: 2018-06-27 | Discharge: 2018-06-28 | Disposition: A | Discharge status: Home / Self Care | Payer: Medicare Other | Source: Ambulatory Visit | Attending: Obstetrics and Gynecology | Admitting: Obstetrics and Gynecology

## 2018-06-27 DIAGNOSIS — J449 Chronic obstructive pulmonary disease, unspecified: Secondary | ICD-10-CM | POA: Insufficient documentation

## 2018-06-27 DIAGNOSIS — Z7982 Long term (current) use of aspirin: Secondary | ICD-10-CM | POA: Diagnosis not present

## 2018-06-27 DIAGNOSIS — N83202 Unspecified ovarian cyst, left side: Secondary | ICD-10-CM | POA: Diagnosis not present

## 2018-06-27 DIAGNOSIS — Z853 Personal history of malignant neoplasm of breast: Secondary | ICD-10-CM | POA: Diagnosis not present

## 2018-06-27 DIAGNOSIS — Z923 Personal history of irradiation: Secondary | ICD-10-CM | POA: Diagnosis not present

## 2018-06-27 DIAGNOSIS — I10 Essential (primary) hypertension: Secondary | ICD-10-CM | POA: Insufficient documentation

## 2018-06-27 DIAGNOSIS — D271 Benign neoplasm of left ovary: Secondary | ICD-10-CM | POA: Diagnosis not present

## 2018-06-27 DIAGNOSIS — Z8544 Personal history of malignant neoplasm of other female genital organs: Secondary | ICD-10-CM | POA: Insufficient documentation

## 2018-06-27 DIAGNOSIS — Z7951 Long term (current) use of inhaled steroids: Secondary | ICD-10-CM | POA: Insufficient documentation

## 2018-06-27 DIAGNOSIS — N838 Other noninflammatory disorders of ovary, fallopian tube and broad ligament: Secondary | ICD-10-CM | POA: Insufficient documentation

## 2018-06-27 DIAGNOSIS — D282 Benign neoplasm of uterine tubes and ligaments: Secondary | ICD-10-CM | POA: Diagnosis not present

## 2018-06-27 DIAGNOSIS — Z90722 Acquired absence of ovaries, bilateral: Secondary | ICD-10-CM

## 2018-06-27 DIAGNOSIS — Z881 Allergy status to other antibiotic agents status: Secondary | ICD-10-CM | POA: Diagnosis not present

## 2018-06-27 DIAGNOSIS — G4730 Sleep apnea, unspecified: Secondary | ICD-10-CM | POA: Insufficient documentation

## 2018-06-27 DIAGNOSIS — Z8249 Family history of ischemic heart disease and other diseases of the circulatory system: Secondary | ICD-10-CM | POA: Insufficient documentation

## 2018-06-27 DIAGNOSIS — K219 Gastro-esophageal reflux disease without esophagitis: Secondary | ICD-10-CM | POA: Insufficient documentation

## 2018-06-27 DIAGNOSIS — Z79899 Other long term (current) drug therapy: Secondary | ICD-10-CM | POA: Diagnosis not present

## 2018-06-27 DIAGNOSIS — Z885 Allergy status to narcotic agent status: Secondary | ICD-10-CM | POA: Insufficient documentation

## 2018-06-27 DIAGNOSIS — F419 Anxiety disorder, unspecified: Secondary | ICD-10-CM | POA: Insufficient documentation

## 2018-06-27 DIAGNOSIS — Z87891 Personal history of nicotine dependence: Secondary | ICD-10-CM | POA: Diagnosis not present

## 2018-06-27 DIAGNOSIS — I2510 Atherosclerotic heart disease of native coronary artery without angina pectoris: Secondary | ICD-10-CM | POA: Insufficient documentation

## 2018-06-27 DIAGNOSIS — D270 Benign neoplasm of right ovary: Secondary | ICD-10-CM | POA: Insufficient documentation

## 2018-06-27 HISTORY — PX: LAPAROSCOPIC BILATERAL SALPINGO OOPHERECTOMY: SHX5890

## 2018-06-27 LAB — CREATININE, SERUM
Creatinine, Ser: 0.83 mg/dL (ref 0.44–1.00)
GFR calc non Af Amer: >60 mL/min (ref >60)

## 2018-06-27 LAB — TYPE AND SCREEN
ABO/RH(D): A NEG
Antibody Screen: NEGATIVE

## 2018-06-27 LAB — ABO/RH: ABO/RH(D): A NEG

## 2018-06-27 SURGERY — SALPINGO-OOPHORECTOMY, BILATERAL, LAPAROSCOPIC
Anesthesia: General | Site: Abdomen | Laterality: Bilateral

## 2018-06-27 MED ORDER — LACTATED RINGERS IV SOLN
INTRAVENOUS | Status: DC
  Administered 2018-06-27 (×2): via INTRAVENOUS

## 2018-06-27 MED ORDER — SEVOFLURANE IN SOLN
RESPIRATORY_TRACT | Status: AC
  Filled 2018-06-27: qty 250

## 2018-06-27 MED ORDER — LACTATED RINGERS IV SOLN: INTRAVENOUS | Status: DC

## 2018-06-27 MED ORDER — PROPOFOL 10 MG/ML IV BOLUS
INTRAVENOUS | Status: DC | PRN
  Administered 2018-06-27: 150 mg via INTRAVENOUS

## 2018-06-27 MED ORDER — FENTANYL CITRATE (PF) 100 MCG/2ML IJ SOLN
25.0000 ug | INTRAMUSCULAR | Status: DC | PRN
  Administered 2018-06-27: 50 ug via INTRAVENOUS
  Administered 2018-06-27 (×4): 25 ug via INTRAVENOUS

## 2018-06-27 MED ORDER — PROMETHAZINE HCL 25 MG/ML IJ SOLN: 6.2500 mg | INTRAMUSCULAR | Status: DC | PRN

## 2018-06-27 MED ORDER — BUPIVACAINE HCL 0.5 % IJ SOLN
INTRAMUSCULAR | Status: DC | PRN
  Administered 2018-06-27: 18 mL

## 2018-06-27 MED ORDER — IBUPROFEN 600 MG PO TABS: 600.0000 mg | ORAL_TABLET | Freq: Four times a day (QID) | ORAL | Status: DC | PRN

## 2018-06-27 MED ORDER — ACETAMINOPHEN 500 MG PO TABS
1000.0000 mg | ORAL_TABLET | Freq: Four times a day (QID) | ORAL | Status: DC | PRN
  Administered 2018-06-27 – 2018-06-28 (×2): 1000 mg via ORAL
  Filled 2018-06-27 (×2): qty 2

## 2018-06-27 MED ORDER — ENOXAPARIN SODIUM 40 MG/0.4ML ~~LOC~~ SOLN: 40.0000 mg | SUBCUTANEOUS | Status: DC

## 2018-06-27 MED ORDER — ALBUTEROL SULFATE HFA 108 (90 BASE) MCG/ACT IN AERS
2.0000 | INHALATION_SPRAY | Freq: Four times a day (QID) | RESPIRATORY_TRACT | Status: DC | PRN
  Filled 2018-06-27: qty 6.7

## 2018-06-27 MED ORDER — TRAMADOL HCL 50 MG PO TABS: 50.0000 mg | ORAL_TABLET | Freq: Four times a day (QID) | ORAL | Status: DC | PRN

## 2018-06-27 MED ORDER — FENTANYL CITRATE (PF) 100 MCG/2ML IJ SOLN
INTRAMUSCULAR | Status: DC | PRN
  Administered 2018-06-27 (×4): 50 ug via INTRAVENOUS

## 2018-06-27 MED ORDER — MEPERIDINE HCL 50 MG/ML IJ SOLN: 6.2500 mg | INTRAMUSCULAR | Status: DC | PRN

## 2018-06-27 MED ORDER — FENTANYL CITRATE (PF) 100 MCG/2ML IJ SOLN
INTRAMUSCULAR | Status: AC
  Filled 2018-06-27: qty 2

## 2018-06-27 MED ORDER — DEXAMETHASONE SODIUM PHOSPHATE 10 MG/ML IJ SOLN
INTRAMUSCULAR | Status: AC
  Filled 2018-06-27: qty 1

## 2018-06-27 MED ORDER — ROCURONIUM BROMIDE 50 MG/5ML IV SOLN
INTRAVENOUS | Status: AC
  Filled 2018-06-27: qty 1

## 2018-06-27 MED ORDER — BUPIVACAINE HCL (PF) 0.5 % IJ SOLN
INTRAMUSCULAR | Status: AC
  Filled 2018-06-27: qty 30

## 2018-06-27 MED ORDER — METOPROLOL SUCCINATE ER 25 MG PO TB24
25.0000 mg | ORAL_TABLET | Freq: Every day | ORAL | Status: DC
  Administered 2018-06-28: 25 mg via ORAL
  Filled 2018-06-27: qty 1

## 2018-06-27 MED ORDER — SODIUM CHLORIDE (PF) 0.9 % IJ SOLN
INTRAMUSCULAR | Status: AC
  Filled 2018-06-27: qty 10

## 2018-06-27 MED ORDER — SUGAMMADEX SODIUM 200 MG/2ML IV SOLN
INTRAVENOUS | Status: DC | PRN
  Administered 2018-06-27: 225 mg via INTRAVENOUS

## 2018-06-27 MED ORDER — ALPRAZOLAM 0.5 MG PO TABS: 0.5000 mg | ORAL_TABLET | Freq: Two times a day (BID) | ORAL | Status: DC | PRN

## 2018-06-27 MED ORDER — ONDANSETRON HCL 4 MG/2ML IJ SOLN
INTRAMUSCULAR | Status: DC | PRN
  Administered 2018-06-27: 4 mg via INTRAVENOUS

## 2018-06-27 MED ORDER — POLYVINYL ALCOHOL 1.4 % OP SOLN
1.0000 [drp] | Freq: Every day | OPHTHALMIC | Status: DC | PRN
  Filled 2018-06-27: qty 15

## 2018-06-27 MED ORDER — PHENYLEPHRINE HCL 10 MG/ML IJ SOLN
INTRAMUSCULAR | Status: DC | PRN
  Administered 2018-06-27: 50 ug via INTRAVENOUS
  Administered 2018-06-27: 100 ug via INTRAVENOUS

## 2018-06-27 MED ORDER — FENTANYL CITRATE (PF) 100 MCG/2ML IJ SOLN
INTRAMUSCULAR | Status: AC
  Administered 2018-06-27: 25 ug via INTRAVENOUS
  Filled 2018-06-27: qty 2

## 2018-06-27 MED ORDER — PROPOFOL 10 MG/ML IV BOLUS
INTRAVENOUS | Status: AC
  Filled 2018-06-27: qty 20

## 2018-06-27 MED ORDER — SIMETHICONE 80 MG PO CHEW
80.0000 mg | CHEWABLE_TABLET | Freq: Four times a day (QID) | ORAL | Status: DC | PRN
  Administered 2018-06-27 – 2018-06-28 (×2): 80 mg via ORAL
  Filled 2018-06-27 (×2): qty 1

## 2018-06-27 MED ORDER — FAMOTIDINE 20 MG PO TABS
20.0000 mg | ORAL_TABLET | Freq: Every day | ORAL | Status: DC
  Administered 2018-06-28: 20 mg via ORAL
  Filled 2018-06-27: qty 1

## 2018-06-27 MED ORDER — DEXAMETHASONE SODIUM PHOSPHATE 10 MG/ML IJ SOLN
INTRAMUSCULAR | Status: DC | PRN
  Administered 2018-06-27: 10 mg via INTRAVENOUS

## 2018-06-27 MED ORDER — MIDAZOLAM HCL 2 MG/2ML IJ SOLN
INTRAMUSCULAR | Status: DC | PRN
  Administered 2018-06-27: 1 mg via INTRAVENOUS

## 2018-06-27 MED ORDER — ROCURONIUM BROMIDE 100 MG/10ML IV SOLN
INTRAVENOUS | Status: DC | PRN
  Administered 2018-06-27: 50 mg via INTRAVENOUS
  Administered 2018-06-27 (×2): 10 mg via INTRAVENOUS

## 2018-06-27 MED ORDER — FENTANYL CITRATE (PF) 100 MCG/2ML IJ SOLN
INTRAMUSCULAR | Status: AC
  Administered 2018-06-27: 50 ug via INTRAVENOUS
  Filled 2018-06-27: qty 2

## 2018-06-27 MED ORDER — LACTATED RINGERS IV SOLN
INTRAVENOUS | Status: DC
  Administered 2018-06-27 – 2018-06-28 (×2): via INTRAVENOUS

## 2018-06-27 MED ORDER — MIDAZOLAM HCL 2 MG/2ML IJ SOLN
INTRAMUSCULAR | Status: AC
  Filled 2018-06-27: qty 2

## 2018-06-27 SURGICAL SUPPLY — 42 items
ANCHOR TIS RET SYS 235ML (MISCELLANEOUS) ×4 IMPLANT
BLADE SURG SZ11 CARB STEEL (BLADE) ×2 IMPLANT
CANISTER SUCT 1200ML W/VALVE (MISCELLANEOUS) ×2 IMPLANT
CATH ROBINSON RED A/P 16FR (CATHETERS) IMPLANT
CHLORAPREP W/TINT 26ML (MISCELLANEOUS) ×2 IMPLANT
CORD MONOPOLAR M/FML 12FT (MISCELLANEOUS) ×2 IMPLANT
DEFOGGER SCOPE WARMER CLEARIFY (MISCELLANEOUS) ×2 IMPLANT
DERMABOND ADVANCED (GAUZE/BANDAGES/DRESSINGS) ×1
DERMABOND ADVANCED .7 DNX12 (GAUZE/BANDAGES/DRESSINGS) ×1 IMPLANT
DRSG TEGADERM 2-3/8X2-3/4 SM (GAUZE/BANDAGES/DRESSINGS) IMPLANT
GLOVE BIO SURGEON STRL SZ8 (GLOVE) ×2 IMPLANT
GLOVE BIOGEL PI IND STRL 6.5 (GLOVE) ×1 IMPLANT
GLOVE BIOGEL PI INDICATOR 6.5 (GLOVE) ×1
GLOVE SURG SYN 6.5 ES PF (GLOVE) ×2 IMPLANT
GOWN STRL REUS W/ TWL LRG LVL3 (GOWN DISPOSABLE) ×3 IMPLANT
GOWN STRL REUS W/ TWL XL LVL3 (GOWN DISPOSABLE) ×1 IMPLANT
GOWN STRL REUS W/TWL LRG LVL3 (GOWN DISPOSABLE) ×3
GOWN STRL REUS W/TWL XL LVL3 (GOWN DISPOSABLE) ×1
GRASPER SUT TROCAR 14GX15 (MISCELLANEOUS) ×2 IMPLANT
IRRIGATION STRYKERFLOW (MISCELLANEOUS) ×1 IMPLANT
IRRIGATOR STRYKERFLOW (MISCELLANEOUS) ×2
IV LACTATED RINGERS 1000ML (IV SOLUTION) ×2 IMPLANT
KIT PINK PAD W/HEAD ARE REST (MISCELLANEOUS) ×2
KIT PINK PAD W/HEAD ARM REST (MISCELLANEOUS) ×1 IMPLANT
LABEL OR SOLS (LABEL) ×2 IMPLANT
LIGASURE LAP MARYLAND 5MM 37CM (ELECTROSURGICAL) ×2 IMPLANT
NS IRRIG 500ML POUR BTL (IV SOLUTION) ×2 IMPLANT
PACK GYN LAPAROSCOPIC (MISCELLANEOUS) ×2 IMPLANT
PAD OB MATERNITY 4.3X12.25 (PERSONAL CARE ITEMS) ×2 IMPLANT
PAD PREP 24X41 OB/GYN DISP (PERSONAL CARE ITEMS) ×2 IMPLANT
SCISSORS METZENBAUM CVD 33 (INSTRUMENTS) ×2 IMPLANT
SHEARS HARMONIC ACE PLUS 36CM (ENDOMECHANICALS) IMPLANT
SLEEVE ENDOPATH XCEL 5M (ENDOMECHANICALS) ×4 IMPLANT
SPONGE GAUZE 2X2 8PLY STRL LF (GAUZE/BANDAGES/DRESSINGS) IMPLANT
SUT VIC AB 2-0 UR6 27 (SUTURE) ×2 IMPLANT
SUT VIC AB 4-0 PS2 18 (SUTURE) ×4 IMPLANT
SYR 10ML LL (SYRINGE) ×4 IMPLANT
TRAY FOLEY MTR SLVR 16FR STAT (SET/KITS/TRAYS/PACK) ×2 IMPLANT
TROCAR 5M 150ML BLDLS (TROCAR) ×2 IMPLANT
TROCAR ENDO BLADELESS 11MM (ENDOMECHANICALS) ×4 IMPLANT
TROCAR XCEL NON-BLD 5MMX100MML (ENDOMECHANICALS) ×2 IMPLANT
TUBING INSUFFLATION (TUBING) ×2 IMPLANT

## 2018-06-27 NOTE — Anesthesia Preprocedure Evaluation (Signed)
Anesthesia Evaluation  Patient identified by MRN, date of birth, ID band Patient awake    Reviewed: Allergy & Precautions, NPO status , Patient's Chart, lab work & pertinent test results  History of Anesthesia Complications Negative for: history of anesthetic complications  Airway Mallampati: II  TM Distance: >3 FB Neck ROM: Full    Dental no notable dental hx.    Pulmonary sleep apnea (does not tolerate CPAP) , COPD, former smoker,    breath sounds clear to auscultation- rhonchi (-) wheezing      Cardiovascular hypertension, + CAD (nonocclusive)  (-) Past MI, (-) Cardiac Stents and (-) CABG  Rhythm:Regular Rate:Normal - Systolic murmurs and - Diastolic murmurs    Neuro/Psych PSYCHIATRIC DISORDERS Anxiety negative neurological ROS     GI/Hepatic Neg liver ROS, GERD  ,  Endo/Other  neg diabetesHypothyroidism   Renal/GU negative Renal ROS     Musculoskeletal negative musculoskeletal ROS (+)   Abdominal (+) + obese,   Peds  Hematology negative hematology ROS (+)   Anesthesia Other Findings Past Medical History: No date: Anxiety 01/23/2014: Breast cancer ([place])     Comment:  Left breast, T1c, N0, M0. ER/PR +; Her 2 neu not               overexpressed. Oncotype DX: 13,Low risk.9% over 10 years. 1995: Cancer ([place])     Comment:  vulvar, [facility] No date: COPD (chronic obstructive pulmonary disease) (HCC)     Comment:  bronchitis No date: Dysrhythmia No date: GERD (gastroesophageal reflux disease) No date: History of colon polyps No date: Hypercholesterolemia No date: Hypertension No date: Hypothyroidism No date: Insomnia No date: Lung cancer ([place]) 01/09/2014: Papilloma of breast No date: Personal history of radiation therapy 11/18/2015: Personal history of tobacco use, presenting hazards to  health No date: Sleep apnea No date: Sleep apnea No date: SOB (shortness of breath) on exertion No date: Spinal  stenosis No date: Thyroid disease No date: Vitamin D deficiency   Reproductive/Obstetrics                             Anesthesia Physical Anesthesia Plan  ASA: III  Anesthesia Plan: General   Post-op Pain Management:    Induction: Intravenous  PONV Risk Score and Plan: 2 and Ondansetron, Dexamethasone and Midazolam  Airway Management Planned: Oral ETT  Additional Equipment:   Intra-op Plan:   Post-operative Plan: Extubation in OR  Informed Consent: I have reviewed the patients History and Physical, chart, labs and discussed the procedure including the risks, benefits and alternatives for the proposed anesthesia with the patient or authorized representative who has indicated his/her understanding and acceptance.   Dental advisory given  Plan Discussed with: CRNA and Anesthesiologist  Anesthesia Plan Comments:         Anesthesia Quick Evaluation

## 2018-06-27 NOTE — Anesthesia Postprocedure Evaluation (Signed)
Anesthesia Post Note  Patient: Michelle Pitts  Procedure(s) Performed: LAPAROSCOPIC BILATERAL SALPINGO OOPHORECTOMY (Bilateral Abdomen)  Patient location during evaluation: PACU Anesthesia Type: General Level of consciousness: awake and alert and oriented Pain management: pain level controlled Vital Signs Assessment: post-procedure vital signs reviewed and stable Respiratory status: spontaneous breathing, nonlabored ventilation and respiratory function stable Cardiovascular status: blood pressure returned to baseline and stable Postop Assessment: no signs of nausea or vomiting Anesthetic complications: no     Last Vitals:  Vitals:   06/27/18 1135 06/27/18 1142  BP:  (!) 147/61  Pulse: 87 86  Resp: 11 11  Temp:    SpO2: 92% 99%    Last Pain:  Vitals:   06/27/18 1157  TempSrc:   PainSc: 5                  Tyrina Hines

## 2018-06-27 NOTE — Op Note (Addendum)
  Operative Note   06/27/2018  PRE-OP DIAGNOSIS: Left ovarian cyst  POST-OP DIAGNOSIS: same   PROCEDURE: Procedure(s): LAPAROSCOPIC BILATERAL SALPINGO OOPHORECTOMY   SURGEON: Adrian Prows MD  ASSISTANT: Mellody Drown MD  ANESTHESIA: Choice   ESTIMATED BLOOD LOSS: 25 cc  COMPLICATIONS: None  DISPOSITION: PACU - hemodynamically stable.  CONDITION: stable  FINDINGS:  Left ovary was enlarged and adherent to the vaginal cuff and bowel. Right ovary normal. Fallopian tubes normal. Normal liver edge.  Gallbladder edge not visualized because of the bowel fat. Normal appendix. Pelvic adhesions to left ovary but no other  intra-abdominal adhesions were noted.  PROCEDURE IN DETAIL: The patient was taken to the OR where anesthesia was administed. The patient was positioned in dorsal supine. Foley catheter placed. The patient was prepped and draped in the normal sterile fashion.   Attention was turned to the patient's abdomen where a 5 mm skin incision was made in the umbilical fold, after injection of local anesthesia. The 5 mm port was then placed under direct visualization with the operative laparoscope.  Pneumoperitoneum was obtained.  The above noted findings. She was positioned into Trendelenburg.  A 5 mm trocar was then placed in the left lower quadrant under direct visualization with the laparoscope follwed by a 11 mm trocar in the right lower quadrant. The right and left ovaries and fallopian tubes were identified. The right ovary was grasped and elevated. The [place] Ligasure was used to coagulate and cut the IP freeing the right fallopian tube and ovary. The right tube and ovary were placed into a laparoscopic bag and removed through the 11 mm port. The attention was then turned to the left ovary. The left ovary was significantly adhered to the cuff and bowel. An intraoperative consult was placed to Dr. Fransisca Connors who assisted in the laparoscopic resection the ovary and fallopian  tube using the ligasure. Once the ovary was freed and the IP cut and coagulated the ovary was placed into a laparoscopic bag and removed through the 11 mm port.  Dr. Fransisca Connors inspected the ovary and felt that the tissue was grossly normal, no need for a frozen section to be performed. The 11 mm port was then closed using the carter thompson device. The sites of removal were inspected and ere all hemostatic.   No injuries or bleeding was noted.  All instruments and ports were then removed from the abdomen after gas was expelled and patient was leveled.   The skin was closed with 4-0 vicryl and skin adhesive. The patient tolerated the procedure well. All counts were correct x 2. The patient was transferred to the recovery room awake, alert and breathing independently.  Adrian Prows MD [facility] 06/27/18 11:49 AM

## 2018-06-27 NOTE — Anesthesia Procedure Notes (Signed)
Procedure Name: Intubation Date/Time: 06/27/2018 9:05 AM Performed by: Allean Found, CRNA Pre-anesthesia Checklist: Patient identified, Patient being monitored, Timeout performed, Emergency Drugs available and Suction available Patient Re-evaluated:Patient Re-evaluated prior to induction Oxygen Delivery Method: Circle system utilized Preoxygenation: Pre-oxygenation with 100% oxygen Induction Type: IV induction Ventilation: Mask ventilation without difficulty Laryngoscope Size: Mac and 3 Grade View: Grade I Tube type: Oral Tube size: 7.0 mm Number of attempts: 1 Airway Equipment and Method: Stylet Placement Confirmation: ETT inserted through vocal cords under direct vision,  positive ETCO2 and breath sounds checked- equal and bilateral Secured at: 21 cm Tube secured with: Tape Dental Injury: Teeth and Oropharynx as per pre-operative assessment

## 2018-06-27 NOTE — Interval H&P Note (Signed)
History and Physical Interval Note:  06/27/2018 8:44 AM  Michelle Pitts  has presented today for surgery, with the diagnosis of OVARIAN CYSTS  The various methods of treatment have been discussed with the patient and family. After consideration of risks, benefits and other options for treatment, the patient has consented to  Procedure(s): LAPAROSCOPIC BILATERAL SALPINGO OOPHORECTOMY (Bilateral) as a surgical intervention .  The patient's history has been reviewed, patient examined, no change in status, stable for surgery.  I have reviewed the patient's chart and labs.  Questions were answered to the patient's satisfaction.     [place]

## 2018-06-27 NOTE — Anesthesia Post-op Follow-up Note (Signed)
Anesthesia QCDR form completed.        

## 2018-06-27 NOTE — Transfer of Care (Signed)
Immediate Anesthesia Transfer of Care Note  Patient: Michelle Pitts  Procedure(s) Performed: LAPAROSCOPIC BILATERAL SALPINGO OOPHORECTOMY (Bilateral Abdomen)  Patient Location: PACU  Anesthesia Type:General  Level of Consciousness: awake and oriented  Airway & Oxygen Therapy: Patient Spontanous Breathing and Patient connected to nasal cannula oxygen  Post-op Assessment: Report given to RN and Post -op Vital signs reviewed and stable  Post vital signs: Reviewed and stable  Last Vitals:  Vitals Value Taken Time  BP 160/82 06/27/2018 11:12 AM  Temp 36.3 C 06/27/2018 11:12 AM  Pulse 81 06/27/2018 11:15 AM  Resp 17 06/27/2018 11:15 AM  SpO2 100 % 06/27/2018 11:15 AM  Vitals shown include unvalidated device data.  Last Pain:  Vitals:   06/27/18 0753  TempSrc: Temporal  PainSc: 0-No pain         Complications: No apparent anesthesia complications

## 2018-06-28 ENCOUNTER — Encounter: Payer: Self-pay | Admitting: Obstetrics and Gynecology

## 2018-06-28 DIAGNOSIS — D271 Benign neoplasm of left ovary: Secondary | ICD-10-CM | POA: Diagnosis not present

## 2018-06-28 LAB — BASIC METABOLIC PANEL
ANION GAP: 7 (ref 5–15)
BUN: 15 mg/dL (ref 8–23)
CALCIUM: 9.4 mg/dL (ref 8.9–10.3)
CO2: 27 mmol/L (ref 22–32)
Chloride: 109 mmol/L (ref 98–111)
Creatinine, Ser: 0.74 mg/dL (ref 0.44–1.00)
Glucose, Bld: 131 mg/dL — ABNORMAL HIGH (ref 70–99)
Potassium: 4.9 mmol/L (ref 3.5–5.1)
Sodium: 143 mmol/L (ref 135–145)

## 2018-06-28 MED ORDER — ACETAMINOPHEN 500 MG PO TABS: 1000.0000 mg | ORAL_TABLET | Freq: Four times a day (QID) | ORAL | 0 refills | Status: DC | PRN

## 2018-06-28 MED ORDER — IBUPROFEN 600 MG PO TABS: 600.0000 mg | ORAL_TABLET | Freq: Four times a day (QID) | ORAL | 0 refills | Status: DC | PRN

## 2018-06-28 MED ORDER — SIMETHICONE 80 MG PO CHEW: 80.0000 mg | CHEWABLE_TABLET | Freq: Four times a day (QID) | ORAL | 0 refills | Status: DC | PRN

## 2018-06-28 NOTE — Discharge Summary (Signed)
Physician Discharge Summary   Patient ID: Michelle Pitts [id number] 67 y.o. [birth year]  Admit date: 06/27/2018  Discharge date and time: No discharge date for patient encounter.   Admitting Physician: Homero Fellers, MD   Discharge Physician: Adrian Prows MD  Admission Diagnoses: OVARIAN CYSTS  Discharge Diagnoses:  S/p laparoscopic bilateral salpingo oophorectomy  Admission Condition: good  Discharged Condition: good  Indication for Admission: post operative monitoring  Hospital Course: Patient was admitted after surgery. She has been meeting goals and doing well. Pain is controlled. Tolerating a normal diet.   Consults: None  Significant Diagnostic Studies: none  Treatments: IV hydration and surgery   Discharge Exam: BP (!) 147/80   Pulse 90   Temp (!) 97.5 F (36.4 C) (Oral)   Resp 18   Ht 5' 4.5" (1.638 m)   Wt 112.5 kg   SpO2 99%   BMI 41.91 kg/m   General Appearance:    Alert, cooperative, no distress, appears stated age  Head:    Normocephalic, without obvious abnormality, atraumatic  Eyes:    PERRL, conjunctiva/corneas clear, EOM's intact, fundi    benign, both eyes  Ears:    Normal TM's and external ear canals, both ears  Nose:   Nares normal, septum midline, mucosa normal, no drainage    or sinus tenderness  Throat:   Lips, mucosa, and tongue normal; teeth and gums normal  Neck:   Supple, symmetrical, trachea midline, no adenopathy;    thyroid:  no enlargement/tenderness/nodules; no carotid   bruit or JVD  Back:     Symmetric, no curvature, ROM normal, no CVA tenderness  Lungs:     Clear to auscultation bilaterally, respirations unlabored  Chest Wall:    No tenderness or deformity   Heart:    Regular rate and rhythm, S1 and S2 normal, no murmur, rub   or gallop  Breast Exam:    No tenderness, masses, or nipple abnormality  Abdomen:     Soft, non-tender, bowel sounds active all four quadrants,    no masses, no organomegaly   Genitalia:    Normal female without lesion, discharge or tenderness  Rectal:    Normal tone, normal prostate, no masses or tenderness;   guaiac negative stool  Extremities:   Extremities normal, atraumatic, no cyanosis or edema  Pulses:   2+ and symmetric all extremities  Skin:   Skin color, texture, turgor normal, no rashes or lesions  Lymph nodes:   Cervical, supraclavicular, and axillary nodes normal  Neurologic:   CNII-XII intact, normal strength, sensation and reflexes    throughout    Disposition: Discharge disposition: 01-Home or Self Care       Patient Instructions:  Allergies as of 06/28/2018      Reactions   Codeine Nausea And Vomiting   Hydrochlorothiazide Swelling, Other (See Comments)   Swelling of tongue.   Levaquin [levofloxacin In D5w] Hives, Swelling, Other (See Comments)   Leg swelling      Medication List    TAKE these medications   acetaminophen 500 MG tablet Commonly known as:  TYLENOL Take 2 tablets (1,000 mg total) by mouth every 6 (six) hours as needed for mild pain, fever or headache.   albuterol 108 (90 Base) MCG/ACT inhaler Commonly known as:  PROVENTIL HFA;VENTOLIN HFA Inhale 2 puffs into the lungs every 6 (six) hours as needed for wheezing or shortness of breath.   ALPRAZolam 0.5 MG tablet Commonly known as:  XANAX Take 1 tablet (0.5 mg  total) by mouth 2 (two) times daily. As needed.   cholecalciferol 25 MCG (1000 UT) tablet Commonly known as:  VITAMIN D Take 1 tablet (1,000 Units total) by mouth daily.   cimetidine 200 MG tablet Commonly known as:  TAGAMET Take 200 mg by mouth daily as needed.   ibuprofen 600 MG tablet Commonly known as:  ADVIL,MOTRIN Take 1 tablet (600 mg total) by mouth every 6 (six) hours as needed for moderate pain.   metoprolol succinate 25 MG 24 hr tablet Commonly known as:  TOPROL-XL Take 1 tablet (25 mg total) by mouth daily.   simethicone 80 MG chewable tablet Commonly known as:  MYLICON Chew 1  tablet (80 mg total) by mouth 4 (four) times daily as needed for flatulence.   SYSTANE 0.4-0.3 % Soln Generic drug:  Polyethyl Glycol-Propyl Glycol Place 1 drop into both eyes daily as needed (for dry eyes).            Discharge Care Instructions  (From admission, onward)         Start     Ordered   06/28/18 0000  Discharge wound care:    Comments:  Shower daily, pat incisions dry.   06/28/18 0713         Activity: activity as tolerated Diet: regular diet Wound Care: keep wound clean and dry  Follow-up with Dr. Gilman Schmidt in 1 week.  Signed: Homero Fellers 06/28/2018 7:13 AM

## 2018-06-28 NOTE — Progress Notes (Signed)
Patient ID: Michelle Pitts, female   DOB: [birth year], 67 y.o.   MRN: [id number]   CHIEF COMPLAINT:  No chief complaint on file.   Subjective  Patient is doing well. No complaints. She has been up ambulating. She took tylenol once but has not taken any other pain medicine. She feels like she does not need it. She is passing gas. The mylicon helped her feel better. She denies dizziness.  She is tolerating a normal diet.   Objective   Examination:  General exam: Appears calm and comfortable  Respiratory system: Clear to auscultation. Respiratory effort normal. HEENT: [place]/AT, PERRLA, no thrush, no stridor. Cardiovascular system: S1 & S2 heard, RRR. No JVD, murmurs, rubs, gallops or clicks. No pedal edema. Gastrointestinal system: Abdomen is nondistended, soft and nontender. No organomegaly or masses felt. Normal bowel sounds heard. Incisions are clean and dry, intact. Small erythema around the incisions. No expansion of the redness, likely bruising.  Central nervous system: Alert and oriented. No focal neurological deficits. Extremities: Symmetric 5 x 5 power. Skin: No rashes, lesions or ulcers Psychiatry: Judgement and insight appear normal. Mood & affect appropriate.   VITALS:  height is 5' 4.5" (1.638 m) and weight is 112.5 kg. Her oral temperature is 97.5 F (36.4 C) (abnormal). Her blood pressure is 147/80 (abnormal) and her pulse is 90. Her respiration is 18 and oxygen saturation is 99%.   I personally reviewed Labs under Results section.   Assessment/Plan:  67 yo s/p laparoscopic bilateral salpingo oophorectomy. POD#1.  1. Continue tylenol as needed for pain  2. Continue home medications 3. Discharge home this afternoon  Code Status: full  Family Communication: none  Disposition Plan: home  DVT Prophylaxis  SCDs    Time spent: 20 minutes   LOS: 0 days   [place]

## 2018-06-28 NOTE — Progress Notes (Signed)
Discharge order received from doctor. Reviewed discharge instructions and prescriptions with patient and answered all questions. Follow up appointment instructions given. Patient verbalized understanding. Patient discharged home via wheelchair by nursing/auxillary.     Hilbert Bible, RN

## 2018-06-29 LAB — SURGICAL PATHOLOGY

## 2018-06-29 NOTE — Progress Notes (Signed)
Called and informed patient of the benign result.

## 2018-07-02 ENCOUNTER — Encounter: Payer: Self-pay | Admitting: Physician Assistant

## 2018-07-02 ENCOUNTER — Ambulatory Visit: Payer: Medicare Other | Admitting: Physician Assistant

## 2018-07-02 VITALS — BP 122/68 | HR 96 | Temp 98.2°F | Resp 16 | Wt 242.7 lb

## 2018-07-02 DIAGNOSIS — Z90722 Acquired absence of ovaries, bilateral: Secondary | ICD-10-CM | POA: Diagnosis not present

## 2018-07-02 DIAGNOSIS — K5909 Other constipation: Secondary | ICD-10-CM | POA: Diagnosis not present

## 2018-07-02 NOTE — Progress Notes (Signed)
Patient: Michelle Pitts Female    DOB: Apr 07, 1951   67 y.o.   MRN: [id number] Visit Date: 07/02/2018  Today's Provider: Mar Daring, PA-C   Chief Complaint  Patient presents with  . Follow-up   Subjective:    HPI Patient here to follow up ovarian cyst. Patient followed by GYN and had Laparoscopic Bilateral Salpingo Oophorectomy on 06/27/18. Since her surgery she has been having increased abdominal bloating. She reports decreased BM since surgery. Denies melena or hematochezia.    Allergies  Allergen Reactions  . Codeine Nausea And Vomiting  . Hydrochlorothiazide Swelling and Other (See Comments)    Swelling of tongue.  [place] [Levofloxacin In D5w] Hives, Swelling and Other (See Comments)    Leg swelling      Current Outpatient Medications:  .  cimetidine (TAGAMET) 200 MG tablet, Take 200 mg by mouth daily as needed., Disp: , Rfl:  .  metoprolol succinate (TOPROL-XL) 25 MG 24 hr tablet, Take 1 tablet (25 mg total) by mouth daily., Disp: 90 tablet, Rfl: 1 .  simethicone (MYLICON) 80 MG chewable tablet, Chew 1 tablet (80 mg total) by mouth 4 (four) times daily as needed for flatulence., Disp: 30 tablet, Rfl: 0 .  acetaminophen (TYLENOL) 500 MG tablet, Take 2 tablets (1,000 mg total) by mouth every 6 (six) hours as needed for mild pain, fever or headache. (Patient not taking: Reported on 07/02/2018), Disp: 30 tablet, Rfl: 0 .  albuterol (PROAIR HFA) 108 (90 Base) MCG/ACT inhaler, Inhale 2 puffs into the lungs every 6 (six) hours as needed for wheezing or shortness of breath. (Patient not taking: Reported on 07/02/2018), Disp: 18 g, Rfl: 3 .  ALPRAZolam (XANAX) 0.5 MG tablet, Take 1 tablet (0.5 mg total) by mouth 2 (two) times daily. As needed. (Patient not taking: Reported on 07/02/2018), Disp: 60 tablet, Rfl: 0 .  cholecalciferol (VITAMIN D) 1000 units tablet, Take 1 tablet (1,000 Units total) by mouth daily. (Patient not taking: Reported on 07/02/2018), Disp:  30 tablet, Rfl: 11 .  Polyethyl Glycol-Propyl Glycol (SYSTANE) 0.4-0.3 % SOLN, Place 1 drop into both eyes daily as needed (for dry eyes)., Disp: , Rfl:   Review of Systems  Constitutional: Negative.   Respiratory: Negative.   Cardiovascular: Negative.   Gastrointestinal: Positive for abdominal distention and constipation.  Genitourinary: Negative.   Neurological: Negative.     Social History   Tobacco Use  . Smoking status: Former Smoker    Packs/day: 1.00    Years: 50.00    Pack years: 50.00    Types: Cigarettes    Last attempt to quit: 09/15/2014    Years since quitting: 3.7  . Smokeless tobacco: Never Used  Substance Use Topics  . Alcohol use: Yes    Alcohol/week: 14.0 standard drinks    Types: 14 Cans of beer per week   Objective:   BP 122/68 (BP Location: Right Arm, Patient Position: Sitting, Cuff Size: Large)   Pulse 96   Temp 98.2 F (36.8 C) (Oral)   Resp 16   Wt 242 lb 11.2 oz (110.1 kg)   SpO2 96%   BMI 41.02 kg/m  Vitals:   07/02/18 1217  BP: 122/68  Pulse: 96  Resp: 16  Temp: 98.2 F (36.8 C)  TempSrc: Oral  SpO2: 96%  Weight: 242 lb 11.2 oz (110.1 kg)     Physical Exam  Constitutional: She is oriented to person, place, and time. She appears well-developed and well-nourished.  No distress.  Cardiovascular: Normal rate, regular rhythm and normal heart sounds. Exam reveals no gallop and no friction rub.  No murmur heard. Pulmonary/Chest: Effort normal and breath sounds normal. No respiratory distress. She has no wheezes. She has no rales.  Abdominal: Soft. Normal appearance and bowel sounds are normal. She exhibits no distension and no mass. There is no hepatosplenomegaly. There is generalized tenderness. There is no rebound, no guarding and no CVA tenderness.    Neurological: She is alert and oriented to person, place, and time.  Skin: Skin is warm and dry. She is not diaphoretic.  Vitals reviewed.      Assessment & Plan:     1. Other  constipation Discussed adding Miralax and stool softener to try to achieve a soft, but not watery, BM. Increase fluid intake. Call if symptoms worsen.  2. S/P bilateral salpingo-oophorectomy Doing well except for above issue. Incisions healing well.        Mar Daring, PA-C  [facility]

## 2018-07-02 NOTE — Patient Instructions (Addendum)
Stool softener AND dulcolax or colace (laxative)   Constipation, Adult Constipation is when a person has fewer bowel movements in a week than normal, has difficulty having a bowel movement, or has stools that are dry, hard, or larger than normal. Constipation may be caused by an underlying condition. It may become worse with age if a person takes certain medicines and does not take in enough fluids. Follow these instructions at home: Eating and drinking   Eat foods that have a lot of fiber, such as fresh fruits and vegetables, whole grains, and beans.  Limit foods that are high in fat, low in fiber, or overly processed, such as french fries, hamburgers, cookies, candies, and soda.  Drink enough fluid to keep your urine clear or pale yellow. General instructions  Exercise regularly or as told by your health care provider.  Go to the restroom when you have the urge to go. Do not hold it in.  Take over-the-counter and prescription medicines only as told by your health care provider. These include any fiber supplements.  Practice pelvic floor retraining exercises, such as deep breathing while relaxing the lower abdomen and pelvic floor relaxation during bowel movements.  Watch your condition for any changes.  Keep all follow-up visits as told by your health care provider. This is important. Contact a health care provider if:  You have pain that gets worse.  You have a fever.  You do not have a bowel movement after 4 days.  You vomit.  You are not hungry.  You lose weight.  You are bleeding from the anus.  You have thin, pencil-like stools. Get help right away if:  You have a fever and your symptoms suddenly get worse.  You leak stool or have blood in your stool.  Your abdomen is bloated.  You have severe pain in your abdomen.  You feel dizzy or you faint. This information is not intended to replace advice given to you by your health care provider. Make sure you  discuss any questions you have with your health care provider. Document Released: 04/29/2004 Document Revised: 02/19/2016 Document Reviewed: 01/20/2016 Elsevier Interactive Patient Education  2018 [employer].

## 2018-07-05 ENCOUNTER — Encounter: Payer: Self-pay | Admitting: Obstetrics and Gynecology

## 2018-07-05 ENCOUNTER — Ambulatory Visit: Payer: Medicare Other | Admitting: Radiation Oncology

## 2018-07-05 ENCOUNTER — Ambulatory Visit (INDEPENDENT_AMBULATORY_CARE_PROVIDER_SITE_OTHER): Payer: Medicare Other | Admitting: Obstetrics and Gynecology

## 2018-07-05 VITALS — BP 122/76 | Ht 64.5 in | Wt 251.0 lb

## 2018-07-05 DIAGNOSIS — Z9889 Other specified postprocedural states: Secondary | ICD-10-CM

## 2018-07-05 NOTE — Progress Notes (Signed)
  Postoperative Follow-up Patient presents post op from laparoscopic bilteral salpingo oophorectomy for enlarged ovarian cyst, 1 week ago.  Subjective: Patient reports marked improvement in her preop symptoms. Eating a regular diet without difficulty. The patient is not having any pain.  Activity: normal activities of daily living. Patient reports additional symptom's since surgery of None.  Objective: BP 122/76   Ht 5' 4.5" (1.638 m)   Wt 251 lb (113.9 kg)   BMI 42.42 kg/m  Physical Exam  Constitutional: She is oriented to person, place, and time. She appears well-developed.  Genitourinary: Vagina normal and uterus normal. There is no lesion on the right labia. There is no lesion on the left labia. Vagina exhibits no lesion. Right adnexum does not display mass. Left adnexum does not display mass. Cervix does not exhibit motion tenderness.  HENT:  Head: Normocephalic and atraumatic.  Eyes: EOM are normal.  Neck: Neck supple. No thyromegaly present.  Cardiovascular: Normal rate, regular rhythm and normal heart sounds.  Pulmonary/Chest: Effort normal and breath sounds normal. Right breast exhibits no inverted nipple, no mass, no nipple discharge and no skin change. Left breast exhibits no inverted nipple, no mass, no nipple discharge and no skin change.  Abdominal: Soft. Bowel sounds are normal. She exhibits no distension and no mass.  All four Incisions healing well.   Neurological: She is alert and oriented to person, place, and time.  Skin: Skin is warm and dry.  Psychiatric: She has a normal mood and affect. Her behavior is normal. Judgment and thought content normal.  Vitals reviewed.   Assessment: s/p :  laparoscopic bilateral salpingo oophorectomy stable  Plan: Patient has done well after surgery with no apparent complications.  I have discussed the post-operative course to date, and the expected progress moving forward.  The patient understands what complications to be  concerned about.  I will see the patient in routine follow up, or sooner if needed.    Activity plan: continue with light activity for 3 more weeks.   Christanna R Schuman 07/05/2018, 11:39 AM

## 2018-07-19 ENCOUNTER — Encounter: Payer: Self-pay | Admitting: Radiation Oncology

## 2018-07-19 ENCOUNTER — Other Ambulatory Visit: Payer: Self-pay

## 2018-07-19 ENCOUNTER — Ambulatory Visit
Admission: RE | Admit: 2018-07-19 | Discharge: 2018-07-19 | Disposition: A | Discharge status: Home / Self Care | Payer: Medicare Other | Source: Ambulatory Visit | Attending: Radiation Oncology | Admitting: Radiation Oncology

## 2018-07-19 ENCOUNTER — Other Ambulatory Visit: Payer: Self-pay | Admitting: *Deleted

## 2018-07-19 DIAGNOSIS — C3432 Malignant neoplasm of lower lobe, left bronchus or lung: Secondary | ICD-10-CM

## 2018-07-19 NOTE — Progress Notes (Signed)
Radiation Oncology Follow up Note  Name: Michelle Pitts   Date:   07/19/2018 MRN:  [id number] DOB: 1950-10-19    This 67 y.o. female presents to the clinic today for ne-month follow-up status post SB RT.for stage I non-small cell lung cancer the left lower lobe  REFERRING PROVIDER: Mar Daring, Mamie Nick*  HPI: patient is a 67 year old female now seen out 1 month having completed SB RT to her left lower lobe for stage I non-small cell lung cancer. Patient been previous treated for breast cancer back in 2016. Seen today in routine follow-up she is doing well. She's having no side effects. She specifically denies dysphagia coughor fatigue..  COMPLICATIONS OF TREATMENT: none  FOLLOW UP COMPLIANCE: keeps appointments   PHYSICAL EXAM:  Temp (P) 97.9 F (36.6 C) (Tympanic)   Resp (P) 16   Wt (P) 247 lb 7.5 oz (112.2 kg)   BMI (P) 41.82 kg/m  Well-developed well-nourished patient in NAD. HEENT reveals PERLA, EOMI, discs not visualized.  Oral cavity is clear. No oral mucosal lesions are identified. Neck is clear without evidence of cervical or supraclavicular adenopathy. Lungs are clear to A&P. Cardiac examination is essentially unremarkable with regular rate and rhythm without murmur rub or thrill. Abdomen is benign with no organomegaly or masses noted. Motor sensory and DTR levels are equal and symmetric in the upper and lower extremities. Cranial nerves II through XII are grossly intact. Proprioception is intact. No peripheral adenopathy or edema is identified. No motor or sensory levels are noted. Crude visual fields are within normal range.  RADIOLOGY RESULTS: no current films for review  PLAN: present time patient is doing well 1 month out from SB RT. I'm please were overall progress. I've asked to see her back in 3 months for follow-up and will obtain a CT scan can of her chest with contrast prior to that visit. Patient is to call with any concerns at any time.  I would like to take  this opportunity to thank you for allowing me to participate in the care of your patient.Noreene Filbert, MD

## 2018-08-13 ENCOUNTER — Encounter: Payer: Self-pay | Admitting: Obstetrics and Gynecology

## 2018-08-13 ENCOUNTER — Ambulatory Visit (INDEPENDENT_AMBULATORY_CARE_PROVIDER_SITE_OTHER): Payer: Medicare Other | Admitting: Obstetrics and Gynecology

## 2018-08-13 VITALS — BP 140/80 | HR 97 | Ht 64.5 in | Wt 241.0 lb

## 2018-08-13 DIAGNOSIS — Z9889 Other specified postprocedural states: Secondary | ICD-10-CM | POA: Diagnosis not present

## 2018-08-13 NOTE — Progress Notes (Signed)
  Postoperative Follow-up Patient presents post op from laparoscopic bilateral salpingo oophorectomy for a left ovarian cyst, 6 weeks ago.  Subjective: Patient reports marked improvement in her preop symptoms. Eating a regular diet without difficulty. The patient is not having any pain.  Activity: normal activities of daily living. Patient reports additional symptom's since surgery of None.  Objective: There were no vitals taken for this visit. Physical Exam Constitutional:      Appearance: She is well-developed.  HENT:     Head: Normocephalic and atraumatic.  Neck:     Musculoskeletal: Neck supple.     Thyroid: No thyromegaly.  Cardiovascular:     Rate and Rhythm: Normal rate and regular rhythm.     Heart sounds: Normal heart sounds.  Pulmonary:     Effort: Pulmonary effort is normal.     Breath sounds: Normal breath sounds.  Chest:     Breasts:        Right: No inverted nipple, mass, nipple discharge or skin change.        Left: No inverted nipple, mass, nipple discharge or skin change.  Abdominal:     General: Bowel sounds are normal. There is no distension.     Palpations: Abdomen is soft. There is no mass.    Neurological:     Mental Status: She is alert and oriented to person, place, and time.  Skin:    General: Skin is warm and dry.  Psychiatric:        Behavior: Behavior normal.        Thought Content: Thought content normal.        Judgment: Judgment normal.  Vitals signs reviewed.    Assessment: s/p : laparoscopic bilateral salpingo oophorectomy stable  Plan: Patient has done well after surgery with no apparent complications.  I have discussed the post-operative course to date, and the expected progress moving forward.  The patient understands what complications to be concerned about.  I will see the patient in routine follow up, or sooner if needed.    Activity plan: No restriction..  Pelvic rest.  Christanna R Schuman 08/13/2018, 1:50 PM

## 2018-09-24 ENCOUNTER — Encounter: Payer: Self-pay | Admitting: Physician Assistant

## 2018-10-08 ENCOUNTER — Encounter: Payer: Self-pay | Admitting: Physician Assistant

## 2018-10-08 DIAGNOSIS — I10 Essential (primary) hypertension: Secondary | ICD-10-CM

## 2018-10-08 MED ORDER — METOPROLOL SUCCINATE ER 25 MG PO TB24: 25.0000 mg | ORAL_TABLET | Freq: Every day | ORAL | 1 refills | Status: DC

## 2018-10-11 ENCOUNTER — Ambulatory Visit
Admission: RE | Admit: 2018-10-11 | Discharge: 2018-10-11 | Disposition: A | Discharge status: Home / Self Care | Payer: Medicare Other | Source: Ambulatory Visit | Attending: Radiation Oncology | Admitting: Radiation Oncology

## 2018-10-11 ENCOUNTER — Inpatient Hospital Stay: Payer: Medicare Other | Attending: Radiation Oncology

## 2018-10-11 DIAGNOSIS — C3432 Malignant neoplasm of lower lobe, left bronchus or lung: Secondary | ICD-10-CM

## 2018-10-11 DIAGNOSIS — C3431 Malignant neoplasm of lower lobe, right bronchus or lung: Secondary | ICD-10-CM | POA: Insufficient documentation

## 2018-10-11 DIAGNOSIS — J449 Chronic obstructive pulmonary disease, unspecified: Secondary | ICD-10-CM | POA: Insufficient documentation

## 2018-10-11 DIAGNOSIS — J939 Pneumothorax, unspecified: Secondary | ICD-10-CM | POA: Diagnosis not present

## 2018-10-11 DIAGNOSIS — Z853 Personal history of malignant neoplasm of breast: Secondary | ICD-10-CM | POA: Insufficient documentation

## 2018-10-11 DIAGNOSIS — Z87891 Personal history of nicotine dependence: Secondary | ICD-10-CM | POA: Diagnosis not present

## 2018-10-11 LAB — BASIC METABOLIC PANEL
Anion gap: 9 (ref 5–15)
BUN: 16 mg/dL (ref 8–23)
CHLORIDE: 104 mmol/L (ref 98–111)
CO2: 26 mmol/L (ref 22–32)
CREATININE: 0.83 mg/dL (ref 0.44–1.00)
Calcium: 9.5 mg/dL (ref 8.9–10.3)
GFR calc Af Amer: >60 mL/min (ref >60)
GFR calc non Af Amer: >60 mL/min (ref >60)
Glucose, Bld: 117 mg/dL — ABNORMAL HIGH (ref 70–99)
Potassium: 4.6 mmol/L (ref 3.5–5.1)
SODIUM: 139 mmol/L (ref 135–145)

## 2018-10-11 MED ORDER — IOHEXOL 300 MG/ML  SOLN
75.0000 mL | Freq: Once | INTRAMUSCULAR | Status: AC | PRN
  Administered 2018-10-11: 75 mL via INTRAVENOUS

## 2018-10-12 ENCOUNTER — Ambulatory Visit (INDEPENDENT_AMBULATORY_CARE_PROVIDER_SITE_OTHER): Payer: Medicare Other | Admitting: Physician Assistant

## 2018-10-12 DIAGNOSIS — Z23 Encounter for immunization: Secondary | ICD-10-CM

## 2018-10-12 NOTE — Progress Notes (Signed)
Nurse Visit:Patient here for Shingrix vaccine. Patient tolerated well. Return in 2 months for second

## 2018-10-18 ENCOUNTER — Other Ambulatory Visit: Payer: Self-pay | Admitting: *Deleted

## 2018-10-18 ENCOUNTER — Other Ambulatory Visit: Payer: Self-pay

## 2018-10-18 ENCOUNTER — Encounter: Payer: Self-pay | Admitting: Radiation Oncology

## 2018-10-18 ENCOUNTER — Ambulatory Visit
Admission: RE | Admit: 2018-10-18 | Discharge: 2018-10-18 | Disposition: A | Discharge status: Home / Self Care | Payer: Medicare Other | Source: Ambulatory Visit | Attending: Radiation Oncology | Admitting: Radiation Oncology

## 2018-10-18 VITALS — BP 162/94 | HR 86 | Temp 97.3°F | Resp 16 | Wt 251.8 lb

## 2018-10-18 DIAGNOSIS — C3432 Malignant neoplasm of lower lobe, left bronchus or lung: Secondary | ICD-10-CM

## 2018-10-18 DIAGNOSIS — F17210 Nicotine dependence, cigarettes, uncomplicated: Secondary | ICD-10-CM | POA: Insufficient documentation

## 2018-10-18 DIAGNOSIS — Z853 Personal history of malignant neoplasm of breast: Secondary | ICD-10-CM | POA: Diagnosis not present

## 2018-10-18 DIAGNOSIS — Z923 Personal history of irradiation: Secondary | ICD-10-CM | POA: Insufficient documentation

## 2018-10-18 NOTE — Progress Notes (Signed)
Radiation Oncology Follow up Note  Name: Michelle Pitts   Date:   10/18/2018 MRN:  [id number] DOB: 1951-06-28    This 68 y.o. female presents to the clinic today for four-month follow-up status post SB RT for stage I non-small cell lung cancer left lower lobe.  REFERRING PROVIDER: Florian Buff*  HPI: patient is a 68 year old female now out 4 months having completed SB RT to her left lower lobe for stage I non-small cell lung cancer. Seen today in routine follow up she is doing well. She specifically denies cough hemoptysis chest tightness or dysphagia..she recently had a repeat chest CT scan showing radiation induced consolidation and distortion in the left lower lobe consistent with radiation changes. There was slight enlargement prevascular lymph node on the left size not pathologic by size criteria will continue to observe.  COMPLICATIONS OF TREATMENT: none  FOLLOW UP COMPLIANCE: keeps appointments   PHYSICAL EXAM:  BP (!) 162/94 (BP Location: Left Arm)   Pulse 86   Temp (!) 97.3 F (36.3 C)   Resp 16   Wt 251 lb 12.3 oz (114.2 kg)   BMI 42.55 kg/m  Well-developed well-nourished patient in NAD. HEENT reveals PERLA, EOMI, discs not visualized.  Oral cavity is clear. No oral mucosal lesions are identified. Neck is clear without evidence of cervical or supraclavicular adenopathy. Lungs are clear to A&P. Cardiac examination is essentially unremarkable with regular rate and rhythm without murmur rub or thrill. Abdomen is benign with no organomegaly or masses noted. Motor sensory and DTR levels are equal and symmetric in the upper and lower extremities. Cranial nerves II through XII are grossly intact. Proprioception is intact. No peripheral adenopathy or edema is identified. No motor or sensory levels are noted. Crude visual fields are within normal range.  RADIOLOGY RESULTS: CT scans reviewed and compatible with the above-stated findings  PLAN: present time patient is doing  well I believe the changes in her chest or second radiation although we will continue to observe. I've asked to see her back in 6 months for follow-up with a CT scan prior to that visit. Patient is to call with any concerns at any time.  I would like to take this opportunity to thank you for allowing me to participate in the care of your patient.Noreene Filbert, MD

## 2018-12-07 ENCOUNTER — Telehealth: Payer: Self-pay | Admitting: Physician Assistant

## 2018-12-07 NOTE — Telephone Encounter (Signed)
Pt called saying she is supposed to get the second shingles shot next week.  She wants to know if she should get the shot or not because of the Covid 19  CB#  [phone]  Thanks teri

## 2018-12-07 NOTE — Telephone Encounter (Signed)
Patient was advised.  

## 2018-12-07 NOTE — Telephone Encounter (Signed)
Yes she can just drive up and call when she is downstairs and we will come down to give her.

## 2018-12-12 ENCOUNTER — Other Ambulatory Visit: Payer: Self-pay

## 2018-12-12 ENCOUNTER — Ambulatory Visit (INDEPENDENT_AMBULATORY_CARE_PROVIDER_SITE_OTHER): Payer: Medicare Other | Admitting: Physician Assistant

## 2018-12-12 DIAGNOSIS — Z23 Encounter for immunization: Secondary | ICD-10-CM

## 2019-01-23 ENCOUNTER — Other Ambulatory Visit: Payer: Self-pay | Admitting: *Deleted

## 2019-01-23 DIAGNOSIS — Z171 Estrogen receptor negative status [ER-]: Secondary | ICD-10-CM

## 2019-01-23 DIAGNOSIS — C50312 Malignant neoplasm of lower-inner quadrant of left female breast: Secondary | ICD-10-CM

## 2019-02-07 ENCOUNTER — Other Ambulatory Visit: Payer: Self-pay

## 2019-02-07 ENCOUNTER — Ambulatory Visit
Admission: RE | Admit: 2019-02-07 | Discharge: 2019-02-07 | Disposition: A | Discharge status: Home / Self Care | Payer: Medicare Other | Source: Ambulatory Visit | Attending: General Surgery | Admitting: General Surgery

## 2019-02-07 DIAGNOSIS — Z171 Estrogen receptor negative status [ER-]: Secondary | ICD-10-CM | POA: Insufficient documentation

## 2019-02-07 DIAGNOSIS — C50312 Malignant neoplasm of lower-inner quadrant of left female breast: Secondary | ICD-10-CM | POA: Insufficient documentation

## 2019-02-12 ENCOUNTER — Ambulatory Visit: Payer: Medicare Other | Admitting: General Surgery

## 2019-02-12 ENCOUNTER — Encounter: Payer: Self-pay | Admitting: General Surgery

## 2019-02-12 ENCOUNTER — Other Ambulatory Visit: Payer: Self-pay

## 2019-02-12 VITALS — BP 155/84 | HR 89 | Temp 97.7°F | Resp 18 | Ht 64.5 in | Wt 254.0 lb

## 2019-02-12 DIAGNOSIS — C50312 Malignant neoplasm of lower-inner quadrant of left female breast: Secondary | ICD-10-CM

## 2019-02-12 DIAGNOSIS — Z171 Estrogen receptor negative status [ER-]: Secondary | ICD-10-CM

## 2019-02-12 NOTE — Patient Instructions (Signed)
Patient will be asked to return to the office in one year with a bilateral screening mammogram. The patient is aware to call back for any questions or concerns. 

## 2019-02-12 NOTE — Progress Notes (Signed)
Patient ID: Michelle Pitts, female   DOB: 25-Apr-1951, 68 y.o.   MRN: [id number]  Chief Complaint  Patient presents with  . Follow-up    HPI Michelle Pitts is a 68 y.o. female who presents for a breast evaluation. The most recent mammogram was done on 02/07/2019.  Patient does perform regular self breast checks and gets regular mammograms done.    HPI  Past Medical History:  Diagnosis Date  . Anxiety   . Breast cancer ([place]) 01/23/2014   Left breast, T1c, N0, M0. ER/PR +; Her 2 neu not overexpressed. Oncotype DX: 13,Low risk.9% over 10 years.   . Cancer ([place]) 1995   vulvar, [facility]  . COPD (chronic obstructive pulmonary disease) ([facility])    bronchitis  . Dysrhythmia   . GERD (gastroesophageal reflux disease)   . History of colon polyps   . Hypercholesterolemia   . Hypertension   . Hypothyroidism   . Insomnia   . Lung cancer ([place])   . 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 Personal history of radiation therapy   . Personal history of tobacco use, presenting hazards to health 11/18/2015  . Sleep apnea   . Sleep apnea   . SOB (shortness of breath) on exertion   . Spinal stenosis   . Thyroid disease   . Vitamin D deficiency     Past Surgical History:  Procedure Laterality Date  . BLADDER SUSPENSION  2001  . BREAST BIOPSY Right   . BREAST BIOPSY Left   . BREAST CYST ASPIRATION Left 1985  . BREAST EXCISIONAL BIOPSY Right 1975   x 2  . BREAST LUMPECTOMY Left 01/23/2014   positive  . BREAST SURGERY Left 01/23/14   wide excision   . BREAST SURGERY  04/10/14   Debridement of fat necrosis  . CHOLECYSTECTOMY    . COLONOSCOPY  2014   Dr Tiffany Kocher  . COLONOSCOPY WITH PROPOFOL N/A 10/10/2016   Procedure: COLONOSCOPY WITH PROPOFOL;  Surgeon: Manya Silvas, MD;  Location[facility] END[facility]COPY;  Service: Endoscopy;  Laterality: N/A;  . ELECTROMAGNETIC NAVIGATION BROCHOSCOPY N/A 04/12/2018   Procedure: ELECTROMAGNETIC NAVIGATION BRONCHOSCOPY;  Surgeon: Flora Lipps, MD;  Location: [facility] ORS;   Service: Cardiopulmonary;  Laterality: N/A;  . LAPAROSCOPIC BILATERAL SALPINGO OOPHERECTOMY Bilateral 06/27/2018   Procedure: LAPAROSCOPIC BILATERAL SALPINGO OOPHORECTOMY;  Surgeon: Homero Fellers, MD;  Location: [facility] ORS;  Service: Gynecology;  Laterality: Bilateral;  . ROTATOR CUFF REPAIR Right 1995  . VAGINAL HYSTERECTOMY  1978   partial  . VULVECTOMY PARTIAL  1995  . wide excision debriedment Left 04/10/14    Family History  Problem Relation Age of Onset  . Diabetes Father   . COPD Mother   . Heart failure Sister   . Emphysema Sister   . Diabetes Sister   . Hypertension Sister   . Emphysema Brother   . Lung cancer Brother   . Crohn's disease Sister   . Hypertension Sister   . Hypertension Sister   . Emphysema Brother   . Cancer Other        great great paternal aunt, ? age  . Cancer Maternal Grandfather 54       ? source  . Cancer Paternal Grandfather 37       ? source  . Breast cancer Neg Hx     Social History Social History   Tobacco Use  . Smoking status: Former Smoker    Packs/day: 1.00    Years: 50.00    Pack years: 50.00  Types: Cigarettes    Quit date: 09/15/2014    Years since quitting: 4.4  . Smokeless tobacco: Never Used  Substance Use Topics  . Alcohol use: Yes    Alcohol/week: 14.0 standard drinks    Types: 14 Cans of beer per week  . Drug use: No    Allergies  Allergen Reactions  . Codeine Nausea And Vomiting  . Gramineae Pollens     sinus  . Hydrochlorothiazide Swelling and Other (See Comments)    Swelling of tongue.  Mack Hook [Levofloxacin In D5w] Hives, Swelling and Other (See Comments)    Leg swelling     Current Outpatient Medications  Medication Sig Dispense Refill  . metoprolol succinate (TOPROL-XL) 25 MG 24 hr tablet Take 1 tablet (25 mg total) by mouth daily. 90 tablet 1   No current facility-administered medications for this visit.     Review of Systems Review of Systems  Constitutional: Negative.    Respiratory: Negative.   Cardiovascular: Negative.     Blood pressure (!) 155/84, pulse 89, temperature 97.7 F (36.5 C), temperature source Temporal, resp. rate 18, height 5' 4.5" (1.638 m), weight 254 lb (115.2 kg), SpO2 97 %.  Physical Exam Physical Exam Exam conducted with a chaperone present.  Constitutional:      Appearance: She is well-developed.  Eyes:     General: No scleral icterus.    Conjunctiva/sclera: Conjunctivae normal.  Neck:     Musculoskeletal: Neck supple.  Cardiovascular:     Rate and Rhythm: Normal rate and regular rhythm.     Heart sounds: Normal heart sounds.  Pulmonary:     Effort: Pulmonary effort is normal.     Breath sounds: Normal breath sounds.  Chest:     Breasts:        Right: Normal.        Left: Normal.    Lymphadenopathy:     Cervical: No cervical adenopathy.     Upper Body:     Right upper body: No supraclavicular or axillary adenopathy.     Left upper body: No axillary adenopathy.  Skin:    General: Skin is warm and dry.  Neurological:     Mental Status: She is alert and oriented to person, place, and time.     Data Reviewed Bilateral diagnostic mammograms dated February 07, 2019 were independently reviewed.  BI-RADS-2. Chest CT post left lower lobe lung nodule radiation of October 11, 2018 reviewed.   Assessment Doing well now 5 years after management of a T1c carcinoma of the left breast.  Plan  Patient will be asked to return to the office in one year with a bilateral screening mammogram. The patient is aware to call back for any questions or concerns.   HPI, Physical Exam, Assessment and Plan have been scribed under the direction and in the presence of Hervey Ard, MD.  Gaspar Cola, CMA  I have completed the exam and reviewed the above documentation for accuracy and completeness.  I agree with the above.  Haematologist has been used and any errors in dictation or transcription are unintentional.  Hervey Ard, M.D., F.A.C.S.   Michelle Pitts 02/13/2019, 8:53 PM

## 2019-02-13 ENCOUNTER — Encounter: Payer: Self-pay | Admitting: General Surgery

## 2019-02-19 ENCOUNTER — Other Ambulatory Visit: Payer: Self-pay

## 2019-02-19 ENCOUNTER — Ambulatory Visit (INDEPENDENT_AMBULATORY_CARE_PROVIDER_SITE_OTHER): Payer: Medicare Other

## 2019-02-19 ENCOUNTER — Encounter: Payer: Self-pay | Admitting: Physician Assistant

## 2019-02-19 ENCOUNTER — Ambulatory Visit (INDEPENDENT_AMBULATORY_CARE_PROVIDER_SITE_OTHER): Payer: Medicare Other | Admitting: Physician Assistant

## 2019-02-19 VITALS — BP 132/58 | HR 94 | Temp 99.5°F | Ht 65.0 in | Wt 255.6 lb

## 2019-02-19 DIAGNOSIS — R748 Abnormal levels of other serum enzymes: Secondary | ICD-10-CM | POA: Diagnosis not present

## 2019-02-19 DIAGNOSIS — Z0000 Encounter for general adult medical examination without abnormal findings: Secondary | ICD-10-CM | POA: Diagnosis not present

## 2019-02-19 DIAGNOSIS — E039 Hypothyroidism, unspecified: Secondary | ICD-10-CM

## 2019-02-19 DIAGNOSIS — I10 Essential (primary) hypertension: Secondary | ICD-10-CM

## 2019-02-19 DIAGNOSIS — E559 Vitamin D deficiency, unspecified: Secondary | ICD-10-CM

## 2019-02-19 NOTE — Patient Instructions (Signed)
Michelle Pitts , Thank you for taking time to come for your Medicare Wellness Visit. I appreciate your ongoing commitment to your health goals. Please review the following plan we discussed and let me know if I can assist you in the future.   Screening recommendations/referrals: Colonoscopy: Up to date, due 09/2026 Mammogram: Up to date, due 01/2021 Bone Density: Up to date, last DEXA was normal. No repeat needed.  Recommended yearly ophthalmology/optometry visit for glaucoma screening and checkup Recommended yearly dental visit for hygiene and checkup  Vaccinations: Influenza vaccine: Up to date Pneumococcal vaccine: Completed series Tdap vaccine: Pt declines today.  Shingles vaccine: Completed series    Advanced directives: Advance directive discussed with you today. I have provided a copy for you to complete at home and have notarized. Once this is complete please bring a copy in to our office so we can scan it into your chart.  Conditions/risks identified: Continue to work towards eating a balanced diet, eating healthy foods, avoid heavy carbs and fats and try eating 3 small meals a day with 2 healthy snacks in between.   Next appointment: 3:00 PM today with Fenton Malling.    Preventive Care 27 Years and Older, Female Preventive care refers to lifestyle choices and visits with your health care provider that can promote health and wellness. What does preventive care include?  A yearly physical exam. This is also called an annual well check.  Dental exams once or twice a year.  Routine eye exams. Ask your health care provider how often you should have your eyes checked.  Personal lifestyle choices, including:  Daily care of your teeth and gums.  Regular physical activity.  Eating a healthy diet.  Avoiding tobacco and drug use.  Limiting alcohol use.  Practicing safe sex.  Taking low-dose aspirin every day.  Taking vitamin and mineral supplements as recommended by  your health care provider. What happens during an annual well check? The services and screenings done by your health care provider during your annual well check will depend on your age, overall health, lifestyle risk factors, and family history of disease. Counseling  Your health care provider may ask you questions about your:  Alcohol use.  Tobacco use.  Drug use.  Emotional well-being.  Home and relationship well-being.  Sexual activity.  Eating habits.  History of falls.  Memory and ability to understand (cognition).  Work and work Statistician.  Reproductive health. Screening  You may have the following tests or measurements:  Height, weight, and BMI.  Blood pressure.  Lipid and cholesterol levels. These may be checked every 5 years, or more frequently if you are over 18 years old.  Skin check.  Lung cancer screening. You may have this screening every year starting at age 85 if you have a 30-pack-year history of smoking and currently smoke or have quit within the past 15 years.  Fecal occult blood test (FOBT) of the stool. You may have this test every year starting at age 68.  Flexible sigmoidoscopy or colonoscopy. You may have a sigmoidoscopy every 5 years or a colonoscopy every 10 years starting at age 67.  Hepatitis C blood test.  Hepatitis B blood test.  Sexually transmitted disease (STD) testing.  Diabetes screening. This is done by checking your blood sugar (glucose) after you have not eaten for a while (fasting). You may have this done every 1-3 years.  Bone density scan. This is done to screen for osteoporosis. You may have this done starting at  age 51.  Mammogram. This may be done every 1-2 years. Talk to your health care provider about how often you should have regular mammograms. Talk with your health care provider about your test results, treatment options, and if necessary, the need for more tests. Vaccines  Your health care provider may  recommend certain vaccines, such as:  Influenza vaccine. This is recommended every year.  Tetanus, diphtheria, and acellular pertussis (Tdap, Td) vaccine. You may need a Td booster every 10 years.  Zoster vaccine. You may need this after age 35.  Pneumococcal 13-valent conjugate (PCV13) vaccine. One dose is recommended after age 20.  Pneumococcal polysaccharide (PPSV23) vaccine. One dose is recommended after age 8. Talk to your health care provider about which screenings and vaccines you need and how often you need them. This information is not intended to replace advice given to you by your health care provider. Make sure you discuss any questions you have with your health care provider. Document Released: 08/28/2015 Document Revised: 04/20/2016 Document Reviewed: 06/02/2015 Elsevier Interactive Patient Education  2017 [place] Prevention in the Home Falls can cause injuries. They can happen to people of all ages. There are many things you can do to make your home safe and to help prevent falls. What can I do on the outside of my home?  Regularly fix the edges of walkways and driveways and fix any cracks.  Remove anything that might make you trip as you walk through a door, such as a raised step or threshold.  Trim any bushes or trees on the path to your home.  Use bright outdoor lighting.  Clear any walking paths of anything that might make someone trip, such as rocks or tools.  Regularly check to see if handrails are loose or broken. Make sure that both sides of any steps have handrails.  Any raised decks and porches should have guardrails on the edges.  Have any leaves, snow, or ice cleared regularly.  Use sand or salt on walking paths during winter.  Clean up any spills in your garage right away. This includes oil or grease spills. What can I do in the bathroom?  Use night lights.  Install grab bars by the toilet and in the tub and shower. Do not use towel  bars as grab bars.  Use non-skid mats or decals in the tub or shower.  If you need to sit down in the shower, use a plastic, non-slip stool.  Keep the floor dry. Clean up any water that spills on the floor as soon as it happens.  Remove soap buildup in the tub or shower regularly.  Attach bath mats securely with double-sided non-slip rug tape.  Do not have throw rugs and other things on the floor that can make you trip. What can I do in the bedroom?  Use night lights.  Make sure that you have a light by your bed that is easy to reach.  Do not use any sheets or blankets that are too big for your bed. They should not hang down onto the floor.  Have a firm chair that has side arms. You can use this for support while you get dressed.  Do not have throw rugs and other things on the floor that can make you trip. What can I do in the kitchen?  Clean up any spills right away.  Avoid walking on wet floors.  Keep items that you use a lot in easy-to-reach places.  If you  need to reach something above you, use a strong step stool that has a grab bar.  Keep electrical cords out of the way.  Do not use floor polish or wax that makes floors slippery. If you must use wax, use non-skid floor wax.  Do not have throw rugs and other things on the floor that can make you trip. What can I do with my stairs?  Do not leave any items on the stairs.  Make sure that there are handrails on both sides of the stairs and use them. Fix handrails that are broken or loose. Make sure that handrails are as long as the stairways.  Check any carpeting to make sure that it is firmly attached to the stairs. Fix any carpet that is loose or worn.  Avoid having throw rugs at the top or bottom of the stairs. If you do have throw rugs, attach them to the floor with carpet tape.  Make sure that you have a light switch at the top of the stairs and the bottom of the stairs. If you do not have them, ask someone to  add them for you. What else can I do to help prevent falls?  Wear shoes that:  Do not have high heels.  Have rubber bottoms.  Are comfortable and fit you well.  Are closed at the toe. Do not wear sandals.  If you use a stepladder:  Make sure that it is fully opened. Do not climb a closed stepladder.  Make sure that both sides of the stepladder are locked into place.  Ask someone to hold it for you, if possible.  Clearly mark and make sure that you can see:  Any grab bars or handrails.  First and last steps.  Where the edge of each step is.  Use tools that help you move around (mobility aids) if they are needed. These include:  Canes.  Walkers.  Scooters.  Crutches.  Turn on the lights when you go into a dark area. Replace any light bulbs as soon as they burn out.  Set up your furniture so you have a clear path. Avoid moving your furniture around.  If any of your floors are uneven, fix them.  If there are any pets around you, be aware of where they are.  Review your medicines with your doctor. Some medicines can make you feel dizzy. This can increase your chance of falling. Ask your doctor what other things that you can do to help prevent falls. This information is not intended to replace advice given to you by your health care provider. Make sure you discuss any questions you have with your health care provider. Document Released: 05/28/2009 Document Revised: 01/07/2016 Document Reviewed: 09/05/2014 Elsevier Interactive Patient Education  2017 [employer].

## 2019-02-19 NOTE — Progress Notes (Signed)
Subjective:   Michelle Pitts is a 68 y.o. female who presents for Medicare Annual (Subsequent) preventive examination.  Review of Systems:  N/A  Cardiac Risk Factors include: advanced age (>29men, >75 women);obesity (BMI >30kg/m2);hypertension     Objective:     Vitals: BP (!) 132/58 (BP Location: Right Arm)   Pulse 94   Temp 99.5 F (37.5 C) (Oral)   Ht 5\' 5"  (1.651 m)   Wt 255 lb 9.6 oz (115.9 kg)   BMI 42.53 kg/m   Body mass index is 42.53 kg/m.  Advanced Directives 02/19/2019 10/18/2018 07/19/2018 04/26/2018 04/18/2018 04/18/2018 04/18/2018  Does Patient Have a Medical Advance Directive? No No No No No No No  Type of Advance Directive - - - - - - -  Would patient like information on creating a medical advance directive? Yes (MAU/Ambulatory/Procedural Areas - Information given) No - Patient declined No - Patient declined No - Patient declined No - Patient declined - No - Patient declined    Tobacco Social History   Tobacco Use  Smoking Status Former Smoker  . Packs/day: 1.00  . Years: 50.00  . Pack years: 50.00  . Types: Cigarettes  . Quit date: 09/15/2014  . Years since quitting: 4.4  Smokeless Tobacco Never Used     Counseling given: Not Answered   Clinical Intake:  Pre-visit preparation completed: Yes  Pain : No/denies pain Pain Score: 0-No pain     Nutritional Status: BMI > 30  Obese Nutritional Risks: None Diabetes: No  How often do you need to have someone help you when you read instructions, pamphlets, or other written materials from your doctor or pharmacy?: 1 - Never  Interpreter Needed?: No  Information entered by :: [employer] LPN  Past Medical History:  Diagnosis Date  . Anxiety   . Breast cancer ([place]) 01/23/2014   Left breast, T1c, N0, M0. ER/PR +; Her 2 neu not overexpressed. Oncotype DX: 13,Low risk.9% over 10 years.   . Cancer ([place]) 1995   vulvar, [facility]  . COPD (chronic obstructive pulmonary disease) (HCC)    bronchitis  .  Dysrhythmia   . GERD (gastroesophageal reflux disease)   . History of colon polyps   . Hypercholesterolemia   . Hypertension   . Hypothyroidism   . Insomnia   . Lung cancer ([place])   . 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 Personal history of radiation therapy   . Personal history of tobacco use, presenting hazards to health 11/18/2015  . Sleep apnea   . Sleep apnea   . SOB (shortness of breath) on exertion   . Spinal stenosis   . Thyroid disease   . Vitamin D deficiency    Past Surgical History:  Procedure Laterality Date  . BLADDER SUSPENSION  2001  . BREAST BIOPSY Right   . BREAST BIOPSY Left   . BREAST CYST ASPIRATION Left 1985  . BREAST EXCISIONAL BIOPSY Right 1975   x 2  . BREAST SURGERY Left 01/23/2014   T1c, N0; Er/ PR positive, Her 2 negative   2 years anti-estrogen RX. Oncotype DX: 13,Low risk.9% over 10 years  . BREAST SURGERY Left 04/10/2014   Debridement of fat necrosis, left breast cancer wide excision site.   . CHOLECYSTECTOMY    . COLONOSCOPY  2014   Dr Tiffany Kocher  . COLONOSCOPY WITH PROPOFOL N/A 10/10/2016   Procedure: COLONOSCOPY WITH PROPOFOL;  Surgeon: Manya Silvas, MD;  Location: [facility] ENDOSCOPY;  Service: Endoscopy;  Laterality: N/A;  .  ELECTROMAGNETIC NAVIGATION BROCHOSCOPY N/A 04/12/2018   Procedure: ELECTROMAGNETIC NAVIGATION BRONCHOSCOPY;  Surgeon: Flora Lipps, MD;  Location: [facility] ORS;  Service: Cardiopulmonary;  Laterality: N/A;  . LAPAROSCOPIC BILATERAL SALPINGO OOPHERECTOMY Bilateral 06/27/2018   Procedure: LAPAROSCOPIC BILATERAL SALPINGO OOPHORECTOMY;  Surgeon: Homero Fellers, MD;  Location: [facility] ORS;  Service: Gynecology;  Laterality: Bilateral;  . ROTATOR CUFF REPAIR Right 1995  . VAGINAL HYSTERECTOMY  1978   partial  . VULVECTOMY PARTIAL  1995  . wide excision debriedment Left 04/10/14   Family History  Problem Relation Age of Onset  . Diabetes Father   . COPD Mother   . Heart failure Sister   . Emphysema Sister   . Diabetes Sister   .  Hypertension Sister   . Emphysema Brother   . Lung cancer Brother   . Crohn's disease Sister   . Hypertension Sister   . Hypertension Sister   . Emphysema Brother   . Cancer Other        great great paternal aunt, ? age  . Cancer Maternal Grandfather 78       ? source  . Cancer Paternal Grandfather 22       ? source  . Breast cancer Neg Hx    Social History   Socioeconomic History  . Marital status: Divorced    Spouse name: Not on file  . Number of children: 2  . Years of education: Not on file  . Highest education level: Some college, no degree  Occupational History  . Occupation: retired  Scientific laboratory technician  . Financial resource strain: Not hard at all  . Food insecurity    Worry: Never true    Inability: Never true  . Transportation needs    Medical: No    Non-medical: No  Tobacco Use  . Smoking status: Former Smoker    Packs/day: 1.00    Years: 50.00    Pack years: 50.00    Types: Cigarettes    Quit date: 09/15/2014    Years since quitting: 4.4  . Smokeless tobacco: Never Used  Substance and Sexual Activity  . Alcohol use: Yes    Alcohol/week: 14.0 standard drinks    Types: 14 Cans of beer per week  . Drug use: No  . Sexual activity: Not Currently    Birth control/protection: Surgical  Lifestyle  . Physical activity    Days per week: 0 days    Minutes per session: 0 min  . Stress: Not at all  Relationships  . Social Herbalist on phone: Patient refused    Gets together: Patient refused    Attends religious service: Patient refused    Active member of club or organization: Patient refused    Attends meetings of clubs or organizations: Patient refused    Relationship status: Patient refused  Other Topics Concern  . Not on file  Social History Narrative  . Not on file    Outpatient Encounter Medications as of 02/19/2019  Medication Sig  . metoprolol succinate (TOPROL-XL) 25 MG 24 hr tablet Take 1 tablet (25 mg total) by mouth daily.   No  facility-administered encounter medications on file as of 02/19/2019.     Activities of Daily Living In your present state of health, do you have any difficulty performing the following activities: 02/19/2019 06/27/2018  Hearing? Y Y  Comment Loosing hearing in right ear. No hearing aid use. -  Vision? N N  Comment Wears eye glasses. -  Difficulty concentrating or  making decisions? N N  Walking or climbing stairs? N N  Dressing or bathing? N N  Doing errands, shopping? N N  Preparing Food and eating ? N -  Using the Toilet? N -  In the past six months, have you accidently leaked urine? Y -  Comment Had a bladder tact years ago. -  Do you have problems with loss of bowel control? N -  Managing your Medications? N -  Managing your Finances? N -  Housekeeping or managing your Housekeeping? N -  Some recent data might be hidden    Patient Care Team: Rubye Beach as PCP - General (Family Medicine) Bary Castilla, Forest Gleason, MD (General Surgery) Noreene Filbert, MD as Referring Physician (Radiation Oncology)    Assessment:   This is a routine wellness examination for Verniece.  Exercise Activities and Dietary recommendations Current Exercise Habits: The patient does not participate in regular exercise at present, Exercise limited by: neurologic condition(s)  Goals    . Have 3 meals a day     Recommend eating 3 small meals a day with 2 healthy snacks in between.     . Have 3 meals a day     Recommend to start eating 3 meals a day with two healthy snacks in between.        Fall Risk: Fall Risk  02/19/2019 02/12/2019 02/14/2018 06/02/2017 02/02/2017  Falls in the past year? 0 0 No No No  Number falls in past yr: - 0 - - -    FALL RISK PREVENTION PERTAINING TO THE HOME:  Any stairs in or around the home? No  If so, are there any without handrails? N/A  Home free of loose throw rugs in walkways, pet beds, electrical cords, etc? Yes  Adequate lighting in your home to reduce  risk of falls? Yes   ASSISTIVE DEVICES UTILIZED TO PREVENT FALLS:  Life alert? No  Use of a cane, walker or w/c? No  Grab bars in the bathroom? No  Shower chair or bench in shower? No  Elevated toilet seat or a handicapped toilet? No    TIMED UP AND GO:  Was the test performed? No .    Depression Screen PHQ 2/9 Scores 02/19/2019 02/14/2018 06/02/2017 02/02/2017  PHQ - 2 Score 0 0 0 0  PHQ- 9 Score - - - -     Cognitive Function     6CIT Screen 02/19/2019 02/02/2017  What Year? 0 points 0 points  What month? 0 points 0 points  What time? 0 points 0 points  Count back from 20 0 points 0 points  Months in reverse 0 points 0 points  Repeat phrase 0 points 2 points  Total Score 0 2    Immunization History  Administered Date(s) Administered  . Influenza, High Dose Seasonal PF 06/25/2015, 06/02/2016, 05/17/2017, 05/01/2018  . Pneumococcal Conjugate-13 05/16/2014  . Pneumococcal Polysaccharide-23 06/16/2007, 02/08/2016  . Zoster 09/17/2007  . Zoster Recombinat (Shingrix) 10/12/2018, 12/12/2018    Qualifies for Shingles Vaccine? Completed series  Tdap: Although this vaccine is not a covered service during a Wellness Exam, does the patient still wish to receive this vaccine today?  No .  Education has been provided regarding the importance of this vaccine. Advised may receive this vaccine at local pharmacy or Health Dept. Aware to provide a copy of the vaccination record if obtained from local pharmacy or Health Dept. Verbalized acceptance and understanding.  Flu Vaccine: Up to date  Pneumococcal Vaccine: Completed  series  Screening Tests Health Maintenance  Topic Date Due  . TETANUS/TDAP  08/15/2026 (Originally 05/14/1970)  . INFLUENZA VACCINE  03/16/2019  . MAMMOGRAM  02/06/2021  . COLONOSCOPY  10/10/2026  . DEXA SCAN  Completed  . Hepatitis C Screening  Completed  . PNA vac Low Risk Adult  Completed    Cancer Screenings:  Colorectal Screening: Completed 10/10/16.  Repeat every 10 years.  Mammogram: Completed 02/07/19.   Bone Density: Completed 05/13/15. Results reflect NORMAL. No repeat needed.   Lung Cancer Screening: (Low Dose CT Chest recommended if Age 9-80 years, 30 pack-year currently smoking OR have quit w/in 15years.) does qualify, however is not due until 04/2019 and order is already placed.   Additional Screening:  Hepatitis C Screening: Up to date  Vision Screening: Recommended annual ophthalmology exams for early detection of glaucoma and other disorders of the eye.  Dental Screening: Recommended annual dental exams for proper oral hygiene  Community Resource Referral:  CRR required this visit?  No       Plan:  I have personally reviewed and addressed the Medicare Annual Wellness questionnaire and have noted the following in the patient's chart:  A. Medical and social history B. Use of alcohol, tobacco or illicit drugs  C. Current medications and supplements D. Functional ability and status E.  Nutritional status F.  Physical activity G. Advance directives H. List of other physicians I.  Hospitalizations, surgeries, and ER visits in previous 12 months J.  [place] such as hearing and vision if needed, cognitive and depression L. Referrals and appointments   In addition, I have reviewed and discussed with patient certain preventive protocols, quality metrics, and best practice recommendations. A written personalized care plan for preventive services as well as general preventive health recommendations were provided to patient. Nurse Health Advisor  Signed,    Derec Mozingo [place], [place]  08/23/7586 Nurse Health Advisor   Nurse Notes: None.

## 2019-02-19 NOTE — Progress Notes (Signed)
Patient: Michelle Pitts, Female    DOB: [birth year], 68 y.o.   MRN: [id number] Visit Date: 02/19/2019  Today's Provider: Mar Daring, PA-C   Chief Complaint  Patient presents with  . Annual Exam   Subjective:     Complete Physical Michelle Pitts is a 68 y.o. female. She feels well. She reports exercising. She reports she is sleeping well.  Had AWV with [facility] today.  -----------------------------------------------------------  Review of Systems  Constitutional: Negative.   HENT: Negative.   Eyes: Positive for discharge and itching.  Respiratory: Negative.   Cardiovascular: Positive for leg swelling.  Gastrointestinal: Negative.   Endocrine: Negative.   Genitourinary: Negative.   Musculoskeletal: Negative.   Skin: Negative.   Allergic/Immunologic: Negative.   Neurological: Negative.   Hematological: Negative.   Psychiatric/Behavioral: Negative.     Social History   Socioeconomic History  . Marital status: Divorced    Spouse name: Not on file  . Number of children: 2  . Years of education: Not on file  . Highest education level: Some college, no degree  Occupational History  . Occupation: retired  Scientific laboratory technician  . Financial resource strain: Not hard at all  . Food insecurity    Worry: Never true    Inability: Never true  . Transportation needs    Medical: No    Non-medical: No  Tobacco Use  . Smoking status: Former Smoker    Packs/day: 1.00    Years: 50.00    Pack years: 50.00    Types: Cigarettes    Quit date: 09/15/2014    Years since quitting: 4.4  . Smokeless tobacco: Never Used  Substance and Sexual Activity  . Alcohol use: Yes    Alcohol/week: 14.0 standard drinks    Types: 14 Cans of beer per week  . Drug use: No  . Sexual activity: Not Currently    Birth control/protection: Surgical  Lifestyle  . Physical activity    Days per week: 0 days    Minutes per session: 0 min  . Stress: Not at all  Relationships  . Social  Herbalist on phone: Patient refused    Gets together: Patient refused    Attends religious service: Patient refused    Active member of club or organization: Patient refused    Attends meetings of clubs or organizations: Patient refused    Relationship status: Patient refused  . Intimate partner violence    Fear of current or ex partner: Patient refused    Emotionally abused: Patient refused    Physically abused: Patient refused    Forced sexual activity: Patient refused  Other Topics Concern  . Not on file  Social History Narrative  . Not on file    Past Medical History:  Diagnosis Date  . Anxiety   . Breast cancer ([place]) 01/23/2014   Left breast, T1c, N0, M0. ER/PR +; Her 2 neu not overexpressed. Oncotype DX: 13,Low risk.9% over 10 years.   . Cancer ([place]) 1995   vulvar, [facility]  . COPD (chronic obstructive pulmonary disease) (HCC)    bronchitis  . Dysrhythmia   . GERD (gastroesophageal reflux disease)   . History of colon polyps   . Hypercholesterolemia   . Hypertension   . Hypothyroidism   . Insomnia   . Lung cancer ([place])   . 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 Personal history of radiation therapy   . Personal history of tobacco use, presenting  hazards to health 11/18/2015  . Sleep apnea   . Sleep apnea   . SOB (shortness of breath) on exertion   . Spinal stenosis   . Thyroid disease   . Vitamin D deficiency      Patient Active Problem List   Diagnosis Date Noted  . S/P bilateral oophorectomy 06/27/2018  . Aortic atherosclerosis ([place]) 06/21/2018  . Malignant neoplasm of lung ([place]) 06/21/2018  . Abnormal EKG 06/21/2018  . Coronary artery calcification seen on CT scan 06/21/2018  . Pneumothorax on left 04/18/2018  . 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 Pneumothorax 04/13/2018  . Pneumothorax after biopsy 04/12/2018  . Lung nodule 03/19/2018  . Bilateral leg numbness 11/10/2017  . Obesity, Class III, BMI 40-49.9 (morbid  obesity) ([place]) 11/10/2017  . DNR no code (do not resuscitate) 02/08/2016  . Personal history of tobacco use, presenting hazards to health 11/18/2015  . Shortness of breath 07/24/2015  . Malignant neoplasm of lower-inner quadrant of left female breast ([place]) 07/22/2015  . Tachycardia 07/22/2015  . Anxiety 05/15/2015  . Colon polyp 05/15/2015  . CAFL (chronic airflow limitation) ([place]) 05/15/2015  . Elevated blood sugar 05/15/2015  . Abnormal liver enzymes 05/15/2015  . Family history of diabetes mellitus 05/15/2015  . BP (high blood pressure) 05/15/2015  . Cannot sleep 05/15/2015  . Apnea, sleep 05/15/2015  . Avitaminosis D 05/15/2015  . 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 Anxiety disorder 05/15/2015  . Chronic obstructive pulmonary disease ([place]) 05/15/2015  . Gastro-esophageal reflux disease without esophagitis 05/15/2015  . 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 Adult hypothyroidism 05/15/2015  . Pure hypercholesterolemia 05/15/2015  . 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 BLADDER SUSPENSION  2001  . BREAST BIOPSY Right   . BREAST BIOPSY Left   . BREAST CYST ASPIRATION Left 1985  . BREAST EXCISIONAL BIOPSY Right 1975   x 2  . BREAST SURGERY Left 01/23/2014   T1c, N0; Er/ PR positive, Her 2 negative   2 years anti-estrogen RX. Oncotype DX: 13,Low risk.9% over 10 years  . BREAST SURGERY Left 04/10/2014   Debridement of fat necrosis, left breast cancer wide excision site.   . CHOLECYSTECTOMY    . COLONOSCOPY  2014   Dr Tiffany Kocher  . COLONOSCOPY WITH PROPOFOL N/A 10/10/2016   Procedure: COLONOSCOPY WITH PROPOFOL;  Surgeon: Manya Silvas, MD;  Location[facility] ENDOSCOPY;  Service: Endoscopy;  Laterality: N/A;  . ELECTROMAGNETIC NAVIGATION BROCHOSCOPY N/A 04/12/2018   Procedure: ELECTROMAGNETIC NAVIGATION BRONCHOSCOPY;  Surgeon: Flora Lipps, MD;  Location: [facility] ORS;  Service: Cardiopulmonary;  Laterality: N/A;  .  LAPAROSCOPIC BILATERAL SALPINGO OOPHERECTOMY Bilateral 06/27/2018   Procedure: LAPAROSCOPIC BILATERAL SALPINGO OOPHORECTOMY;  Surgeon: Homero Fellers, MD;  Location: [facility] ORS;  Service: Gynecology;  Laterality: Bilateral;  . ROTATOR CUFF REPAIR Right 1995  . VAGINAL HYSTERECTOMY  1978   partial  . VULVECTOMY PARTIAL  1995  . wide excision debriedment Left 04/10/14    Her family history includes COPD in her mother; Cancer in an other family member; Cancer (age of onset: 110) in her maternal grandfather; Cancer (age of onset: 60) in her paternal grandfather; Crohn's disease in her sister; Diabetes in her father and sister; Emphysema in her brother, brother, and sister; Heart failure in her sister; Hypertension in her sister, sister, and sister; Lung cancer in her brother. There is no history of Breast cancer.   Current  Outpatient Medications:  .  metoprolol succinate (TOPROL-XL) 25 MG 24 hr tablet, Take 1 tablet (25 mg total) by mouth daily., Disp: 90 tablet, Rfl: 1  Patient Care Team: Mar Daring, PA-C as PCP - General (Family Medicine) Bary Castilla, Forest Gleason, MD (General Surgery) Noreene Filbert, MD as Referring Physician (Radiation Oncology)     Objective:    Vitals: BP (!) 132/58 (BP Location: Right Arm)   Pulse 94   Temp 99.5 F (37.5 C) (Oral)   Ht 5\' 5"  (1.651 m)   Wt 255 lb 9.6 oz (115.9 kg)   BMI 42.53 kg/m   Body mass index is 42.53 kg/m.  Physical Exam Vitals signs reviewed.  Constitutional:      General: She is not in acute distress.    Appearance: Normal appearance. She is well-developed. She is obese. She is not ill-appearing or diaphoretic.  HENT:     Head: Normocephalic and atraumatic.     Right Ear: Tympanic membrane, ear canal and external ear normal.     Left Ear: Tympanic membrane, ear canal and external ear normal.     Nose: Nose normal.     Mouth/Throat:     Mouth: Mucous membranes are moist.     Pharynx: Oropharynx is clear. No oropharyngeal  exudate.  Eyes:     General: No scleral icterus.       Right eye: No discharge.        Left eye: No discharge.     Extraocular Movements: Extraocular movements intact.     Conjunctiva/sclera: Conjunctivae normal.     Pupils: Pupils are equal, round, and reactive to light.  Neck:     Musculoskeletal: Normal range of motion and neck supple.     Thyroid: No thyromegaly.     Vascular: No carotid bruit or JVD.     Trachea: No tracheal deviation.  Cardiovascular:     Rate and Rhythm: Normal rate and regular rhythm.     Pulses: Normal pulses.     Heart sounds: Normal heart sounds. No murmur. No friction rub. No gallop.   Pulmonary:     Effort: Pulmonary effort is normal. No respiratory distress.     Breath sounds: Normal breath sounds. No wheezing or rales.  Chest:     Chest wall: No tenderness.  Abdominal:     General: Bowel sounds are normal. There is no distension.     Palpations: Abdomen is soft. There is no mass.     Tenderness: There is no abdominal tenderness. There is no guarding or rebound.  Musculoskeletal: Normal range of motion.        General: No tenderness.  Lymphadenopathy:     Cervical: No cervical adenopathy.  Skin:    General: Skin is warm and dry.     Capillary Refill: Capillary refill takes less than 2 seconds.     Findings: No rash.  Neurological:     General: No focal deficit present.     Mental Status: She is alert and oriented to person, place, and time. Mental status is at baseline.     Cranial Nerves: No cranial nerve deficit.     Motor: No weakness.     Gait: Gait normal.  Psychiatric:        Mood and Affect: Mood normal.        Behavior: Behavior normal.        Thought Content: Thought content normal.        Judgment: Judgment normal.  Activities of Daily Living In your present state of health, do you have any difficulty performing the following activities: 02/19/2019 06/27/2018  Hearing? Y Y  Comment Loosing hearing in right ear. No hearing  aid use. -  Vision? N N  Comment Wears eye glasses. -  Difficulty concentrating or making decisions? N N  Walking or climbing stairs? N N  Dressing or bathing? N N  Doing errands, shopping? N N  Preparing Food and eating ? N -  Using the Toilet? N -  In the past six months, have you accidently leaked urine? Y -  Comment Had a bladder tact years ago. -  Do you have problems with loss of bowel control? N -  Managing your Medications? N -  Managing your Finances? N -  Housekeeping or managing your Housekeeping? N -  Some recent data might be hidden    Fall Risk Assessment Fall Risk  02/19/2019 02/12/2019 02/14/2018 06/02/2017 02/02/2017  Falls in the past year? 0 0 No No No  Number falls in past yr: - 0 - - -     Depression Screen PHQ 2/9 Scores 02/19/2019 02/14/2018 06/02/2017 02/02/2017  PHQ - 2 Score 0 0 0 0  PHQ- 9 Score - - - -    6CIT Screen 02/19/2019  What Year? 0 points  What month? 0 points  What time? 0 points  Count back from 20 0 points  Months in reverse 0 points  Repeat phrase 0 points  Total Score 0       Assessment & Plan:    Annual Physical Reviewed patient's Family Medical History Reviewed and updated list of patient's medical providers Assessment of cognitive impairment was done Assessed patient's functional ability Established a written schedule for health screening [place] Completed and Reviewed  Exercise Activities and Dietary recommendations Goals    . Have 3 meals a day     Recommend eating 3 small meals a day with 2 healthy snacks in between.     . Have 3 meals a day     Recommend to start eating 3 meals a day with two healthy snacks in between.        Immunization History  Administered Date(s) Administered  . Influenza, High Dose Seasonal PF 06/25/2015, 06/02/2016, 05/17/2017, 05/01/2018  . Pneumococcal Conjugate-13 05/16/2014  . Pneumococcal Polysaccharide-23 06/16/2007, 02/08/2016  . Zoster 09/17/2007  . Zoster  Recombinat (Shingrix) 10/12/2018, 12/12/2018    Health Maintenance  Topic Date Due  . TETANUS/TDAP  08/15/2026 (Originally 05/14/1970)  . INFLUENZA VACCINE  03/16/2019  . MAMMOGRAM  02/06/2021  . COLONOSCOPY  10/10/2026  . DEXA SCAN  Completed  . Hepatitis C Screening  Completed  . PNA vac Low Risk Adult  Completed     Discussed health benefits of physical activity, and encouraged her to engage in regular exercise appropriate for her age and condition.    1. Annual physical exam Normal exam today. Up to date on all screenings and vaccinations.   2. Essential hypertension Stable. Will check labs as below and f/u pending results. - CBC w/Diff/Platelet - Comprehensive Metabolic Panel (CMET) - Lipid Profile  3. Adult hypothyroidism Stable. Will check labs as below and f/u pending results. - TSH  4. Abnormal liver enzymes Stable. Will check labs as below and f/u pending results. - CBC w/Diff/Platelet - Comprehensive Metabolic Panel (CMET) - Lipid Profile  5. Avitaminosis D H/O this. Will check labs as below and f/u pending results. - Vitamin D (  25 hydroxy)  ------------------------------------------------------------------------------------------------------------    Mar Daring, PA-C  [facility]

## 2019-02-19 NOTE — Patient Instructions (Signed)
Health Maintenance After Age 70 After age 70, you are at a higher risk for certain long-term diseases and infections as well as injuries from falls. Falls are a major cause of broken bones and head injuries in people who are older than age 70. Getting regular preventive care can help to keep you healthy and well. Preventive care includes getting regular testing and making lifestyle changes as recommended by your health care provider. Talk with your health care provider about:  Which screenings and tests you should have. A screening is a test that checks for a disease when you have no symptoms.  A diet and exercise plan that is right for you. What should I know about screenings and tests to prevent falls? Screening and testing are the best ways to find a health problem early. Early diagnosis and treatment give you the best chance of managing medical conditions that are common after age 70. Certain conditions and lifestyle choices may make you more likely to have a fall. Your health care provider may recommend:  Regular vision checks. Poor vision and conditions such as cataracts can make you more likely to have a fall. If you wear glasses, make sure to get your prescription updated if your vision changes.  Medicine review. Work with your health care provider to regularly review all of the medicines you are taking, including over-the-counter medicines. Ask your health care provider about any side effects that may make you more likely to have a fall. Tell your health care provider if any medicines that you take make you feel dizzy or sleepy.  Osteoporosis screening. Osteoporosis is a condition that causes the bones to get weaker. This can make the bones weak and cause them to break more easily.  Blood pressure screening. Blood pressure changes and medicines to control blood pressure can make you feel dizzy.  Strength and balance checks. Your health care provider may recommend certain tests to check your  strength and balance while standing, walking, or changing positions.  Foot health exam. Foot pain and numbness, as well as not wearing proper footwear, can make you more likely to have a fall.  Depression screening. You may be more likely to have a fall if you have a fear of falling, feel emotionally low, or feel unable to do activities that you used to do.  Alcohol use screening. Using too much alcohol can affect your balance and may make you more likely to have a fall. What actions can I take to lower my risk of falls? General instructions  Talk with your health care provider about your risks for falling. Tell your health care provider if: ? You fall. Be sure to tell your health care provider about all falls, even ones that seem minor. ? You feel dizzy, sleepy, or off-balance.  Take over-the-counter and prescription medicines only as told by your health care provider. These include any supplements.  Eat a healthy diet and maintain a healthy weight. A healthy diet includes low-fat dairy products, low-fat (lean) meats, and fiber from whole grains, beans, and lots of fruits and vegetables. Home safety  Remove any tripping hazards, such as rugs, cords, and clutter.  Install safety equipment such as grab bars in bathrooms and safety rails on stairs.  Keep rooms and walkways well-lit. Activity   Follow a regular exercise program to stay fit. This will help you maintain your balance. Ask your health care provider what types of exercise are appropriate for you.  If you need a cane or   walker, use it as recommended by your health care provider.  Wear supportive shoes that have nonskid soles. Lifestyle  Do not drink alcohol if your health care provider tells you not to drink.  If you drink alcohol, limit how much you have: ? 0-1 drink a day for women. ? 0-2 drinks a day for men.  Be aware of how much alcohol is in your drink. In the [place] one drink equals one typical bottle of beer (12  oz), one-half glass of wine (5 oz), or one shot of hard liquor (1 oz).  Do not use any products that contain nicotine or tobacco, such as cigarettes and e-cigarettes. If you need help quitting, ask your health care provider. Summary  Having a healthy lifestyle and getting preventive care can help to protect your health and wellness after age 70.  Screening and testing are the best way to find a health problem early and help you avoid having a fall. Early diagnosis and treatment give you the best chance for managing medical conditions that are more common for people who are older than age 70.  Falls are a major cause of broken bones and head injuries in people who are older than age 70. Take precautions to prevent a fall at home.  Work with your health care provider to learn what changes you can make to improve your health and wellness and to prevent falls. This information is not intended to replace advice given to you by your health care provider. Make sure you discuss any questions you have with your health care provider. Document Released: 06/14/2017 Document Revised: 11/22/2018 Document Reviewed: 06/14/2017 Elsevier Patient Education  2020 Elsevier Inc.  

## 2019-02-20 ENCOUNTER — Telehealth: Payer: Self-pay

## 2019-02-20 LAB — COMPREHENSIVE METABOLIC PANEL
ALT: 48 IU/L — ABNORMAL HIGH (ref 0–32)
AST: 36 IU/L (ref 0–40)
Albumin/Globulin Ratio: 1.9 (ref 1.2–2.2)
Albumin: 4.5 g/dL (ref 3.8–4.8)
Alkaline Phosphatase: 155 IU/L — ABNORMAL HIGH (ref 39–117)
BUN/Creatinine Ratio: 22 (ref 12–28)
BUN: 19 mg/dL (ref 8–27)
Bilirubin Total: 0.3 mg/dL (ref 0.0–1.2)
CO2: 23 mmol/L (ref 20–29)
Calcium: 10 mg/dL (ref 8.7–10.3)
Chloride: 103 mmol/L (ref 96–106)
Creatinine, Ser: 0.86 mg/dL (ref 0.57–1.00)
GFR calc Af Amer: 80 mL/min/{1.73_m2} (ref >59)
GFR calc non Af Amer: 69 mL/min/{1.73_m2} (ref >59)
Globulin, Total: 2.4 g/dL (ref 1.5–4.5)
Glucose: 96 mg/dL (ref 65–99)
Potassium: 4.7 mmol/L (ref 3.5–5.2)
Sodium: 140 mmol/L (ref 134–144)
Total Protein: 6.9 g/dL (ref 6.0–8.5)

## 2019-02-20 LAB — CBC WITH DIFFERENTIAL/PLATELET
Basophils Absolute: 0.1 10*3/uL (ref 0.0–0.2)
Basos: 1 %
EOS (ABSOLUTE): 0.1 10*3/uL (ref 0.0–0.4)
Eos: 1 %
Hematocrit: 39.1 % (ref 34.0–46.6)
Hemoglobin: 12.9 g/dL (ref 11.1–15.9)
Immature Grans (Abs): 0 10*3/uL (ref 0.0–0.1)
Immature Granulocytes: 0 %
Lymphocytes Absolute: 1.7 10*3/uL (ref 0.7–3.1)
Lymphs: 25 %
MCH: 30.9 pg (ref 26.6–33.0)
MCHC: 33 g/dL (ref 31.5–35.7)
MCV: 94 fL (ref 79–97)
Monocytes Absolute: 0.6 10*3/uL (ref 0.1–0.9)
Monocytes: 8 %
Neutrophils Absolute: 4.4 10*3/uL (ref 1.4–7.0)
Neutrophils: 65 %
Platelets: 231 10*3/uL (ref 150–450)
RBC: 4.17 x10E6/uL (ref 3.77–5.28)
RDW: 12.8 % (ref 11.7–15.4)
WBC: 6.8 10*3/uL (ref 3.4–10.8)

## 2019-02-20 LAB — LIPID PANEL
Chol/HDL Ratio: 2.9 ratio (ref 0.0–4.4)
Cholesterol, Total: 224 mg/dL — ABNORMAL HIGH (ref 100–199)
HDL: 76 mg/dL (ref >39)
LDL Calculated: 124 mg/dL — ABNORMAL HIGH (ref 0–99)
Triglycerides: 119 mg/dL (ref 0–149)
VLDL Cholesterol Cal: 24 mg/dL (ref 5–40)

## 2019-02-20 LAB — VITAMIN D 25 HYDROXY (VIT D DEFICIENCY, FRACTURES): Vit D, 25-Hydroxy: 42.9 ng/mL (ref 30.0–100.0)

## 2019-02-20 LAB — TSH: TSH: 3.15 u[IU]/mL (ref 0.450–4.500)

## 2019-02-20 NOTE — Telephone Encounter (Signed)
Viewed by Daria Pastures on 02/20/2019 8:37 AM Written by Mar Daring, PA-C on 02/20/2019 8:13 AM Blood count is normal. Sugar is normal. Kidney function is normal. Liver enzymes are stable compared over labs last year and 2 years ago. Sodium, potassium and calcium are normal. Cholesterol is elevated slightly but stable. Thyroid is normal. Vit D is normal

## 2019-02-20 NOTE — Telephone Encounter (Signed)
-----   Message from Mar Daring, [place] sent at 02/20/2019  8:13 AM EDT ----- Blood count is normal. Sugar is normal. Kidney function is normal. Liver enzymes are stable compared over labs last year and 2 years ago. Sodium, potassium and calcium are normal. Cholesterol is elevated slightly but stable. Thyroid is normal. Vit D is normal.

## 2019-03-15 ENCOUNTER — Encounter: Payer: Self-pay | Admitting: General Surgery

## 2019-04-10 ENCOUNTER — Encounter: Payer: Self-pay | Admitting: Physician Assistant

## 2019-04-10 DIAGNOSIS — I10 Essential (primary) hypertension: Secondary | ICD-10-CM

## 2019-04-10 MED ORDER — METOPROLOL SUCCINATE ER 25 MG PO TB24: 25.0000 mg | ORAL_TABLET | Freq: Every day | ORAL | 3 refills | Status: DC

## 2019-04-17 ENCOUNTER — Ambulatory Visit
Admission: RE | Admit: 2019-04-17 | Discharge: 2019-04-17 | Disposition: A | Discharge status: Home / Self Care | Payer: Medicare Other | Source: Ambulatory Visit | Attending: Radiation Oncology | Admitting: Radiation Oncology

## 2019-04-17 ENCOUNTER — Other Ambulatory Visit: Payer: Self-pay

## 2019-04-17 DIAGNOSIS — C3432 Malignant neoplasm of lower lobe, left bronchus or lung: Secondary | ICD-10-CM | POA: Diagnosis present

## 2019-04-17 LAB — POCT I-STAT CREATININE: Creatinine, Ser: 0.7 mg/dL (ref 0.44–1.00)

## 2019-04-17 MED ORDER — IOHEXOL 300 MG/ML  SOLN
75.0000 mL | Freq: Once | INTRAMUSCULAR | Status: AC | PRN
  Administered 2019-04-17: 75 mL via INTRAVENOUS

## 2019-04-22 ENCOUNTER — Other Ambulatory Visit: Payer: Self-pay | Admitting: Obstetrics and Gynecology

## 2019-04-23 ENCOUNTER — Other Ambulatory Visit: Payer: Self-pay

## 2019-04-24 ENCOUNTER — Ambulatory Visit
Admission: RE | Admit: 2019-04-24 | Discharge: 2019-04-24 | Disposition: A | Discharge status: Home / Self Care | Payer: Medicare Other | Source: Ambulatory Visit | Attending: Radiation Oncology | Admitting: Radiation Oncology

## 2019-04-24 ENCOUNTER — Other Ambulatory Visit: Payer: Self-pay | Admitting: *Deleted

## 2019-04-24 ENCOUNTER — Other Ambulatory Visit: Payer: Self-pay

## 2019-04-24 ENCOUNTER — Encounter: Payer: Self-pay | Admitting: Radiation Oncology

## 2019-04-24 VITALS — BP 139/83 | HR 89 | Temp 99.1°F | Resp 16 | Wt 254.7 lb

## 2019-04-24 DIAGNOSIS — Z923 Personal history of irradiation: Secondary | ICD-10-CM | POA: Diagnosis not present

## 2019-04-24 DIAGNOSIS — C3432 Malignant neoplasm of lower lobe, left bronchus or lung: Secondary | ICD-10-CM

## 2019-04-24 DIAGNOSIS — F17210 Nicotine dependence, cigarettes, uncomplicated: Secondary | ICD-10-CM | POA: Diagnosis not present

## 2019-04-24 NOTE — Progress Notes (Signed)
Radiation Oncology Follow up Note  Name: Michelle Pitts   Date:   04/24/2019 MRN:  [id number] DOB: 1951/06/28    This 68 y.o. female presents to the clinic today for 39-month follow-up status post SBRT for stage I non-small cell lung cancer of the left lower lobe.  REFERRING PROVIDER: Florian Buff*  HPI: Patient is a 68 year old female now at 11 months having completed SBRT to her left lower lobe for stage I non-small cell lung cancer.  Seen today in routine follow-up she is doing well.  She specifically denies cough hemoptysis or chest tightness.  She had a recent CT scan this month showing evolutionary changes of radiation in the left lower lobe with no evidence of progression of disease.  COMPLICATIONS OF TREATMENT: none  FOLLOW UP COMPLIANCE: keeps appointments   PHYSICAL EXAM:  BP 139/83 (BP Location: Left Arm, Patient Position: Sitting)   Pulse 89   Temp 99.1 F (37.3 C) (Tympanic)   Resp 16   Wt 254 lb 11.2 oz (115.5 kg)   BMI 42.38 kg/m  Well-developed well-nourished patient in NAD. HEENT reveals PERLA, EOMI, discs not visualized.  Oral cavity is clear. No oral mucosal lesions are identified. Neck is clear without evidence of cervical or supraclavicular adenopathy. Lungs are clear to A&P. Cardiac examination is essentially unremarkable with regular rate and rhythm without murmur rub or thrill. Abdomen is benign with no organomegaly or masses noted. Motor sensory and DTR levels are equal and symmetric in the upper and lower extremities. Cranial nerves II through XII are grossly intact. Proprioception is intact. No peripheral adenopathy or edema is identified. No motor or sensory levels are noted. Crude visual fields are within normal range.  RADIOLOGY RESULTS: CT scans reviewed compatible with above-stated findings  PLAN: Present time patient is doing well with excellent response to SBRT.  I am pleased with her overall progress.  I have asked to see her back in 1  year for follow-up with a CT scan prior to that visit.  Patient knows to call at anytime with any concerns.  I would like to take this opportunity to thank you for allowing me to participate in the care of your patient.Noreene Filbert, MD

## 2019-05-14 ENCOUNTER — Ambulatory Visit (INDEPENDENT_AMBULATORY_CARE_PROVIDER_SITE_OTHER): Payer: Medicare Other

## 2019-05-14 ENCOUNTER — Other Ambulatory Visit: Payer: Self-pay

## 2019-05-14 DIAGNOSIS — Z23 Encounter for immunization: Secondary | ICD-10-CM | POA: Diagnosis not present

## 2019-05-16 ENCOUNTER — Encounter: Payer: Self-pay | Admitting: Cardiothoracic Surgery

## 2019-05-16 ENCOUNTER — Other Ambulatory Visit: Payer: Self-pay

## 2019-05-16 ENCOUNTER — Ambulatory Visit: Payer: Medicare Other | Admitting: Cardiothoracic Surgery

## 2019-05-16 VITALS — BP 137/80 | HR 87 | Temp 98.2°F | Resp 14 | Ht 64.5 in | Wt 253.8 lb

## 2019-05-16 DIAGNOSIS — R918 Other nonspecific abnormal finding of lung field: Secondary | ICD-10-CM | POA: Diagnosis not present

## 2019-05-16 NOTE — Patient Instructions (Signed)
   Follow-up with our office as needed.  Please call and ask to speak with a nurse if you develop questions or concerns.  

## 2019-05-16 NOTE — Progress Notes (Signed)
  Patient ID: Michelle Pitts, female   DOB: August 10, 1951, 68 y.o.   MRN: [id number]  HISTORY: Ms. Signer comes in today in follow-up of her left lower lobe cancer.  She is completed her SBRT to her left lower lobe mass.  That has gone well.  She is scheduled to follow-up with Dr. Donella Stade in a year.  She states that she quit smoking several years ago.  She has really no complaints at this time.   Vitals:   05/16/19 1132  BP: 137/80  Pulse: 87  Resp: 14  Temp: 98.2 F (36.8 C)  SpO2: 95%     EXAM:    Resp: Lungs are clear bilaterally.  No respiratory distress, normal effort. Heart:  Regular without murmurs Abd:  Abdomen is soft, non distended and non tender. No masses are palpable.  There is no rebound and no guarding.  Neurological: Alert and oriented to person, place, and time. Coordination normal.  Skin: Skin is warm and dry. No rash noted. No diaphoretic. No erythema. No pallor.  Psychiatric: Normal mood and affect. Normal behavior. Judgment and thought content normal.    ASSESSMENT: I have independently reviewed the patient's CT scans that has been made over the last year or 2.  We looked at the area in the left perihilar area.  There is improvement in the overall radiographic appearance.   PLAN:   I agree with Dr. Donella Stade that a one-year CT scan would be in order.  She will have that made.  She will follow-up with Dr. Donella Stade at that time.    Nestor Lewandowsky, MD

## 2019-05-20 ENCOUNTER — Encounter: Payer: Self-pay | Admitting: Physician Assistant

## 2019-07-15 ENCOUNTER — Ambulatory Visit
Admission: RE | Admit: 2019-07-15 | Discharge: 2019-07-15 | Disposition: A | Discharge status: Home / Self Care | Payer: Medicare Other | Source: Ambulatory Visit | Attending: Physician Assistant | Admitting: Physician Assistant

## 2019-07-15 ENCOUNTER — Other Ambulatory Visit: Payer: Self-pay

## 2019-07-15 ENCOUNTER — Encounter: Payer: Self-pay | Admitting: Physician Assistant

## 2019-07-15 ENCOUNTER — Ambulatory Visit: Payer: Self-pay

## 2019-07-15 ENCOUNTER — Telehealth (INDEPENDENT_AMBULATORY_CARE_PROVIDER_SITE_OTHER): Payer: Medicare Other | Admitting: Physician Assistant

## 2019-07-15 VITALS — BP 203/94 | HR 97

## 2019-07-15 DIAGNOSIS — R079 Chest pain, unspecified: Secondary | ICD-10-CM | POA: Diagnosis not present

## 2019-07-15 DIAGNOSIS — H60543 Acute eczematoid otitis externa, bilateral: Secondary | ICD-10-CM | POA: Diagnosis not present

## 2019-07-15 MED ORDER — FLUOCINOLONE ACETONIDE 0.01 % OT OIL: 1.0000 [drp] | TOPICAL_OIL | Freq: Every day | OTIC | 0 refills | Status: DC

## 2019-07-15 NOTE — Telephone Encounter (Signed)
Pt c/o left upper chest pain. Pt stated onset was last Tuesday. Pt stated the pain is only present if she leans over , like when she puts her seatbelt on, coughs or sneezes. Pt stated that her upper chest hurts with a deep breath. Pt stated that last year she had a lung bx and was hospitalized twice for a pneumothorax. Pt  Rated the pain as sharp and 7/10 on the pain scale. Pt stated that her SOB is at baseline and has not worsened. Pt denies any left arm, jaw, back, or shoulder pain or weakness.  Advised pt to go to ED for evaluation but pt adamantly refused to go . Pt's provider was going to do a virtual visit today at 1:20 but pt cannot do virtual visits even though she is on MyChart. Alana asked to route triage note to office.   Reason for Disposition . Taking a deep breath makes pain worse  Answer Assessment - Initial Assessment Questions 1. LOCATION: "Where does it hurt?"       Upper left side of chest pt stated it feels like its under her ribs. 2. RADIATION: "Does the pain go anywhere else?" (e.g., into neck, jaw, arms, back)     no 3. ONSET: "When did the chest pain begin?" (Minutes, hours or days)      Last Tuesday 4. PATTERN "Does the pain come and go, or has it been constant since it started?"  "Does it get worse with exertion?"      Comes and goes based activity or if pressure is applied.  5. DURATION: "How long does it last" (e.g., seconds, minutes, hours)     Lasts until she changes position 6. SEVERITY: "How bad is the pain?"  (e.g., Scale 1-10; mild, moderate, or severe)    - MILD (1-3): doesn't interfere with normal activities     - MODERATE (4-7): interferes with normal activities or awakens from sleep    - SEVERE (8-10): excruciating pain, unable to do any normal activities       moderate 7. CARDIAC RISK FACTORS: "Do you have any history of heart problems or risk factors for heart disease?" (e.g., angina, prior heart attack; diabetes, high blood pressure, high cholesterol,  smoker, or strong family history of heart disease)     HTN, high cholesterol, family h/o heart disease CHF sibling and father 57. PULMONARY RISK FACTORS: "Do you have any history of lung disease?"  (e.g., blood clots in lung, asthma, emphysema, birth control pills)     Lung cancer, h/o pneumothorax 2019 9. CAUSE: "What do you think is causing the chest pain?"    Pt doesn't know but concerned was collapsing again 10. OTHER SYMPTOMS: "Do you have any other symptoms?" (e.g., dizziness, nausea, vomiting, sweating, fever, difficulty breathing, cough)       Hurts with deep breathing, SOB at baseline 11. PREGNANCY: "Is there any chance you are pregnant?" "When was your last menstrual period?"       n/a  Protocols used: CHEST PAIN-A-AH

## 2019-07-15 NOTE — Telephone Encounter (Signed)
From [facility] 

## 2019-07-15 NOTE — Progress Notes (Signed)
Patient: Michelle Pitts Female    DOB: 1950/10/16   68 y.o.   MRN: [id number] Visit Date: 07/15/2019  Today's Provider: Mar Daring, PA-C   Chief Complaint  Patient presents with  . Shortness of Breath   Subjective:    I,Joseline E. Rosas,RMA am acting as a Education administrator for [employer], PA-C.   Virtual Visit via Telephone Note  I connected with Michelle Pitts on 07/15/19 at  1:20 PM EST by telephone and verified that I am speaking with the correct person using two identifiers.  Location: Patient: Home Provider: [facility]   I discussed the limitations, risks, security and privacy concerns of performing an evaluation and management service by telephone and the availability of in person appointments. I also discussed with the patient that there may be a patient responsible charge related to this service. The patient expressed understanding and agreed to proceed.  HPI  Patient with c/o left sided chest pain and tenderness to touch. She is hurting on the left side of chest laterally in line with her armpit but just next to and slightly below her left breast. This started last Tuesday with left side around the ribcage area hurtin with deep breathing/coughing and putting pressure. Some movements, like rotating and lateral flexion to the left, also aggravate it. She has been sleeping on the couch. It is better since started but still there. Has not taken anything for the pain. Denies worsening of her SOB (has at baseline). Able to be active and do things around the house without getting winded. Does not remember actually hurting her side but reports she had went grocery shopping and carried all the bags in prior to the onset of the left sided pain.  Associated Symptoms: none.   She also c/o left ear fullness. For the past 4-5 days she reports that is getting dry and flaky. She started using apple cider vinegar.   Allergies  Allergen Reactions  . Codeine Nausea And  Vomiting  . Gramineae Pollens     sinus  . Hydrochlorothiazide Swelling and Other (See Comments)    Swelling of tongue.  Mack Hook [Levofloxacin In D5w] Hives, Swelling and Other (See Comments)    Leg swelling      Current Outpatient Medications:  .  metoprolol succinate (TOPROL-XL) 25 MG 24 hr tablet, Take 1 tablet (25 mg total) by mouth daily., Disp: 90 tablet, Rfl: 3  Review of Systems  Constitutional: Negative for appetite change, chills, fatigue and fever.  Respiratory: Positive for shortness of breath. Negative for chest tightness.   Cardiovascular: Negative for chest pain and palpitations.  Gastrointestinal: Negative for abdominal pain, nausea and vomiting.  Neurological: Negative for dizziness and weakness.    Social History   Tobacco Use  . Smoking status: Former Smoker    Packs/day: 1.00    Years: 50.00    Pack years: 50.00    Types: Cigarettes    Quit date: 09/15/2014    Years since quitting: 4.8  . Smokeless tobacco: Never Used  Substance Use Topics  . Alcohol use: Yes    Alcohol/week: 14.0 standard drinks    Types: 14 Cans of beer per week      Objective:   BP (!) 203/94 (BP Location: Right Arm, Patient Position: Sitting, Cuff Size: Normal) Comment: recheck in rigth wrist 121/90  Pulse 97  Vitals:   07/15/19 1318  BP: (!) 203/94  Pulse: 97  There is no height or weight  on file to calculate BMI.   Physical Exam Vitals signs reviewed.  Constitutional:      General: She is not in acute distress.    Appearance: She is not ill-appearing.  Pulmonary:     Effort: No respiratory distress.  Neurological:     Mental Status: She is alert.      No results found for any visits on 07/15/19.     Assessment & Plan     1. Eczema of both external ears Suspect eczema based on description, but unsure since this was a telephone visit. Will give fluocinolone as below. Call if not improving and she may need to be evaluated in person.  - Fluocinolone  Acetonide 0.01 % OIL; Place 1 drop in ear(s) daily.  Dispense: 20 mL; Refill: 0  2. Left-sided chest pain Slight improvement. Will get CXR as below to r/o pneumothorax (patient has h/o this x 2 following a lung biopsy). Also to r/o any bony involvement since she does have h/o lung cancer. I will f/u pending CXR results. Advised to present to ER if symptoms worsen. - DG Chest 2 View; Future   I discussed the assessment and treatment plan with the patient. The patient was provided an opportunity to ask questions and all were answered. The patient agreed with the plan and demonstrated an understanding of the instructions.   The patient was advised to call back or seek an in-person evaluation if the symptoms worsen or if the condition fails to improve as anticipated.  I provided 11 minutes of non-face-to-face time during this encounter.    Mar Daring, PA-C  [facility]

## 2019-07-15 NOTE — Telephone Encounter (Signed)
Patient scheduled a virtual visit and was seen by provider

## 2019-07-16 ENCOUNTER — Encounter: Payer: Self-pay | Admitting: Physician Assistant

## 2019-07-16 NOTE — Telephone Encounter (Signed)
Copied from [place] [id number]. Topic: General - Inquiry >> Jul 16, 2019 10:48 AM Reyne Dumas L wrote: Reason for CRM:   Pt calling to get results from x-ray done yesterday.

## 2019-07-17 NOTE — Telephone Encounter (Signed)
Results were sent to patient in another mychart message with treatment recommendations

## 2019-07-24 ENCOUNTER — Other Ambulatory Visit: Payer: Self-pay | Admitting: Physician Assistant

## 2019-07-24 DIAGNOSIS — R0789 Other chest pain: Secondary | ICD-10-CM

## 2019-07-24 DIAGNOSIS — C3431 Malignant neoplasm of lower lobe, right bronchus or lung: Secondary | ICD-10-CM

## 2019-07-24 DIAGNOSIS — J939 Pneumothorax, unspecified: Secondary | ICD-10-CM

## 2019-07-24 DIAGNOSIS — R079 Chest pain, unspecified: Secondary | ICD-10-CM

## 2019-07-24 NOTE — Progress Notes (Addendum)
Thoracic MRI ordered per Dr. Bary Castilla request for patient's chest pain  *Patient called and stated pain is better and she wishes to postpone MRI. MRI canceled. *

## 2019-07-25 ENCOUNTER — Encounter: Payer: Self-pay | Admitting: Physician Assistant

## 2019-07-25 DIAGNOSIS — B029 Zoster without complications: Secondary | ICD-10-CM

## 2019-07-25 NOTE — Addendum Note (Signed)
Addended by: Mar Daring on: 07/25/2019 09:53 AM   Modules accepted: Orders

## 2019-07-26 ENCOUNTER — Ambulatory Visit: Payer: Medicare Other | Admitting: Physician Assistant

## 2019-07-26 ENCOUNTER — Other Ambulatory Visit: Payer: Self-pay

## 2019-07-26 ENCOUNTER — Telehealth: Payer: Self-pay | Admitting: Physician Assistant

## 2019-07-26 ENCOUNTER — Encounter: Payer: Self-pay | Admitting: Physician Assistant

## 2019-07-26 VITALS — BP 152/80 | HR 86 | Temp 97.5°F | Resp 16 | Wt 257.0 lb

## 2019-07-26 DIAGNOSIS — B028 Zoster with other complications: Secondary | ICD-10-CM

## 2019-07-26 DIAGNOSIS — C3431 Malignant neoplasm of lower lobe, right bronchus or lung: Secondary | ICD-10-CM

## 2019-07-26 DIAGNOSIS — R0602 Shortness of breath: Secondary | ICD-10-CM | POA: Diagnosis not present

## 2019-07-26 DIAGNOSIS — J939 Pneumothorax, unspecified: Secondary | ICD-10-CM | POA: Diagnosis not present

## 2019-07-26 DIAGNOSIS — J449 Chronic obstructive pulmonary disease, unspecified: Secondary | ICD-10-CM

## 2019-07-26 DIAGNOSIS — J95811 Postprocedural pneumothorax: Secondary | ICD-10-CM

## 2019-07-26 DIAGNOSIS — M546 Pain in thoracic spine: Secondary | ICD-10-CM

## 2019-07-26 MED ORDER — VALACYCLOVIR HCL 500 MG PO TABS: 500.0000 mg | ORAL_TABLET | Freq: Three times a day (TID) | ORAL | 0 refills | Status: DC

## 2019-07-26 MED ORDER — TRAMADOL HCL 50 MG PO TABS: 50.0000 mg | ORAL_TABLET | Freq: Three times a day (TID) | ORAL | 0 refills | Status: AC | PRN

## 2019-07-26 NOTE — Progress Notes (Signed)
Patient: Michelle Pitts Female    DOB: 1950-12-28   68 y.o.   MRN: [id number] Visit Date: 07/26/2019  Today's Provider: Mar Daring, PA-C   No chief complaint on file.  Subjective:     HPI  Patient here today with c/o pain. She reports is on left upper body. Radiates to her shoulder blade in the back and to her chest in the front. No burning. Reports that her pain like a stabbing. She tried Baclofen for one day. She has taken Aleve and IBU.She also had a rash in her forehead.Reports that today is not as red like it was since 07/09/2019.  Allergies  Allergen Reactions  . Codeine Nausea And Vomiting  . Gramineae Pollens     sinus  . Hydrochlorothiazide Swelling and Other (See Comments)    Swelling of tongue.  Mack Hook [Levofloxacin In D5w] Hives, Swelling and Other (See Comments)    Leg swelling      Current Outpatient Medications:  .  Fluocinolone Acetonide 0.01 % OIL, Place 1 drop in ear(s) daily., Disp: 20 mL, Rfl: 0 .  metoprolol succinate (TOPROL-XL) 25 MG 24 hr tablet, Take 1 tablet (25 mg total) by mouth daily., Disp: 90 tablet, Rfl: 3 .  Multiple Vitamins-Minerals (VITAMIN D3 COMPLETE PO), Take by mouth., Disp: , Rfl:   Review of Systems  Constitutional: Negative.   Respiratory: Positive for shortness of breath ("due to pain"). Negative for cough, chest tightness and wheezing.   Cardiovascular: Negative.   Gastrointestinal: Negative.   Neurological: Negative.     Social History   Tobacco Use  . Smoking status: Former Smoker    Packs/day: 1.00    Years: 50.00    Pack years: 50.00    Types: Cigarettes    Quit date: 09/15/2014    Years since quitting: 4.8  . Smokeless tobacco: Never Used  Substance Use Topics  . Alcohol use: Yes    Alcohol/week: 14.0 standard drinks    Types: 14 Cans of beer per week      Objective:   BP (!) 152/80 (BP Location: Right Arm, Patient Position: Sitting, Cuff Size: Large)   Pulse 86   Temp (!) 97.5 F  (36.4 C) (Temporal)   Resp 16   Wt 257 lb (116.6 kg)   SpO2 94%   BMI 43.43 kg/m  Vitals:   07/26/19 1446  BP: (!) 152/80  Pulse: 86  Resp: 16  Temp: (!) 97.5 F (36.4 C)  TempSrc: Temporal  SpO2: 94%  Weight: 257 lb (116.6 kg)  Body mass index is 43.43 kg/m.   Physical Exam Vitals reviewed.  Constitutional:      General: She is not in acute distress.    Appearance: Normal appearance. She is well-developed. She is obese. She is not ill-appearing or diaphoretic.  Cardiovascular:     Rate and Rhythm: Normal rate and regular rhythm.     Pulses: Normal pulses.     Heart sounds: Normal heart sounds. No murmur. No friction rub. No gallop.   Pulmonary:     Effort: Pulmonary effort is normal. No respiratory distress.     Breath sounds: Normal breath sounds. No wheezing or rales.  Musculoskeletal:     Cervical back: Normal range of motion and neck supple.       Back:  Neurological:     Mental Status: She is alert.      No results found for any visits on 07/26/19.  Assessment & Plan    1. Shortness of breath Complicated history with somewhat recent diagnosis of carcinoma of the bronchus with pneumothorax of the left following the biopsy. CXR was unremarkable and did not show recurrent pneumothorax. Will get thoracic MRI as below to r/o metastasis and to see if any soft tissue inflammation or disc disease may be present in the thoracic spine. Will f/u pending results. Tramadol has been given for pain for patient. Will also treat with Valtrex 1g TID x 7 days for possible shingles prodrome since it is following a dermatomal path. Call if symptoms worsen. Will f/u pending imaging.  - MR Thoracic Spine Wo Contrast; Future  2. Pneumothorax after biopsy See above medical treatment plan. - MR Thoracic Spine Wo Contrast; Future  3. Pneumothorax on left See above medical treatment plan. - MR Thoracic Spine Wo Contrast; Future  4. 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 - MR Thoracic Spine Wo Contrast; Future  5. CAFL (chronic airflow limitation) ([place]) See above medical treatment plan. - MR Thoracic Spine Wo Contrast; Future  6. Herpes zoster with complication See above medical treatment plan.  7. Acute left-sided thoracic back pain See above medical treatment plan. - traMADol (ULTRAM) 50 MG tablet; Take 1 tablet (50 mg total) by mouth every 8 (eight) hours as needed for up to 5 days.  Dispense: 30 tablet; Refill: 0     Mar Daring, PA-C  [facility]

## 2019-07-26 NOTE — Telephone Encounter (Signed)
Pt is waiting at pharm and said valtrex per insurance company will not approve valtrex 500 mg one pill 3 times a day however they will approve one pill twice a day. [facility]  in [place]

## 2019-07-29 ENCOUNTER — Encounter: Payer: Self-pay | Admitting: Physician Assistant

## 2019-07-29 MED ORDER — VALACYCLOVIR HCL 1 G PO TABS: 1000.0000 mg | ORAL_TABLET | Freq: Three times a day (TID) | ORAL | 0 refills | Status: DC

## 2019-07-29 NOTE — Addendum Note (Signed)
Addended by: Mar Daring on: 07/29/2019 08:19 AM   Modules accepted: Orders

## 2019-07-29 NOTE — Patient Instructions (Signed)
Acute Back Pain, Adult Acute back pain is sudden and usually short-lived. It is often caused by an injury to the muscles and tissues in the back. The injury may result from:  A muscle or ligament getting overstretched or torn (strained). Ligaments are tissues that connect bones to each other. Lifting something improperly can cause a back strain.  Wear and tear (degeneration) of the spinal disks. Spinal disks are circular tissue that provides cushioning between the bones of the spine (vertebrae).  Twisting motions, such as while playing sports or doing yard work.  A hit to the back.  Arthritis. You may have a physical exam, lab tests, and imaging tests to find the cause of your pain. Acute back pain usually goes away with rest and home care. Follow these instructions at home: Managing pain, stiffness, and swelling  Take over-the-counter and prescription medicines only as told by your health care provider.  Your health care provider may recommend applying ice during the first 24-48 hours after your pain starts. To do this: ? Put ice in a plastic bag. ? Place a towel between your skin and the bag. ? Leave the ice on for 20 minutes, 2-3 times a day.  If directed, apply heat to the affected area as often as told by your health care provider. Use the heat source that your health care provider recommends, such as a moist heat pack or a heating pad. ? Place a towel between your skin and the heat source. ? Leave the heat on for 20-30 minutes. ? Remove the heat if your skin turns bright red. This is especially important if you are unable to feel pain, heat, or cold. You have a greater risk of getting burned. Activity   Do not stay in bed. Staying in bed for more than 1-2 days can delay your recovery.  Sit up and stand up straight. Avoid leaning forward when you sit, or hunching over when you stand. ? If you work at a desk, sit close to it so you do not need to lean over. Keep your chin tucked  in. Keep your neck drawn back, and keep your elbows bent at a right angle. Your arms should look like the letter "L." ? Sit high and close to the steering wheel when you drive. Add lower back (lumbar) support to your car seat, if needed.  Take short walks on even surfaces as soon as you are able. Try to increase the length of time you walk each day.  Do not sit, drive, or stand in one place for more than 30 minutes at a time. Sitting or standing for long periods of time can put stress on your back.  Do not drive or use heavy machinery while taking prescription pain medicine.  Use proper lifting techniques. When you bend and lift, use positions that put less stress on your back: ? [place] your knees. ? Keep the load close to your body. ? Avoid twisting.  Exercise regularly as told by your health care provider. Exercising helps your back heal faster and helps prevent back injuries by keeping muscles strong and flexible.  Work with a physical therapist to make a safe exercise program, as recommended by your health care provider. Do any exercises as told by your physical therapist. Lifestyle  Maintain a healthy weight. Extra weight puts stress on your back and makes it difficult to have good posture.  Avoid activities or situations that make you feel anxious or stressed. Stress and anxiety increase muscle  tension and can make back pain worse. Learn ways to manage anxiety and stress, such as through exercise. General instructions  Sleep on a firm mattress in a comfortable position. Try lying on your side with your knees slightly bent. If you lie on your back, put a pillow under your knees.  Follow your treatment plan as told by your health care provider. This may include: ? Cognitive or behavioral therapy. ? Acupuncture or massage therapy. ? Meditation or yoga. Contact a health care provider if:  You have pain that is not relieved with rest or medicine.  You have increasing pain going down  into your legs or buttocks.  Your pain does not improve after 2 weeks.  You have pain at night.  You lose weight without trying.  You have a fever or chills. Get help right away if:  You develop new bowel or bladder control problems.  You have unusual weakness or numbness in your arms or legs.  You develop nausea or vomiting.  You develop abdominal pain.  You feel faint. Summary  Acute back pain is sudden and usually short-lived.  Use proper lifting techniques. When you bend and lift, use positions that put less stress on your back.  Take over-the-counter and prescription medicines and apply heat or ice as directed by your health care provider. This information is not intended to replace advice given to you by your health care provider. Make sure you discuss any questions you have with your health care provider. Document Released: 08/01/2005 Document Revised: 11/20/2018 Document Reviewed: 03/15/2017 Elsevier Patient Education  2020 [place], which is also known as herpes zoster, is an infection that causes a painful skin rash and fluid-filled blisters. It is caused by a virus. Shingles only develops in people who:  Have had chickenpox.  Have been given a medicine to protect against chickenpox (have been vaccinated). Shingles is rare in this group. What are the causes? Shingles is caused by varicella-zoster virus (VZV). This is the same virus that causes chickenpox. After a person is exposed to VZV, the virus stays in the body in an inactive (dormant) state. Shingles develops if the virus is reactivated. This can happen many years after the first (initial) exposure to VZV. It is not known what causes this virus to be reactivated. What increases the risk? People who have had chickenpox or received the chickenpox vaccine are at risk for shingles. Shingles infection is more common in people who:  Are older than age 63.  Have a weakened  disease-fighting system (immune system), such as people with: ? HIV. ? AIDS. ? Cancer.  Are taking medicines that weaken the immune system, such as transplant medicines.  Are experiencing a lot of stress. What are the signs or symptoms? Early symptoms of this condition include itching, tingling, and pain in an area on your skin. Pain may be described as burning, stabbing, or throbbing. A few days or weeks after early symptoms start, a painful red rash appears. The rash is usually on one side of the body and has a band-like or belt-like pattern. The rash eventually turns into fluid-filled blisters that break open, change into scabs, and dry up in about 2-3 weeks. At any time during the infection, you may also develop:  A fever.  Chills.  A headache.  An upset stomach. How is this diagnosed? This condition is diagnosed with a skin exam. Skin or fluid samples may be taken from the blisters before a diagnosis is made.  These samples are examined under a microscope or sent to a lab for testing. How is this treated? The rash may last for several weeks. There is not a specific cure for this condition. Your health care provider will probably prescribe medicines to help you manage pain, recover more quickly, and avoid long-term problems. Medicines may include:  Antiviral drugs.  Anti-inflammatory drugs.  Pain medicines.  Anti-itching medicines (antihistamines). If the area involved is on your face, you may be referred to a specialist, such as an eye doctor (ophthalmologist) or an ear, nose, and throat (ENT) doctor (otolaryngologist) to help you avoid eye problems, chronic pain, or disability. Follow these instructions at home: Medicines  Take over-the-counter and prescription medicines only as told by your health care provider.  Apply an anti-itch cream or numbing cream to the affected area as told by your health care provider. Relieving itching and discomfort   Apply cold, wet cloths  (cold compresses) to the area of the rash or blisters as told by your health care provider.  Cool baths can be soothing. Try adding baking soda or dry oatmeal to the water to reduce itching. Do not bathe in hot water. Blister and rash care  Keep your rash covered with a loose bandage (dressing). Wear loose-fitting clothing to help ease the pain of material rubbing against the rash.  Keep your rash and blisters clean by washing the area with mild soap and cool water as told by your health care provider.  Check your rash every day for signs of infection. Check for: ? More redness, swelling, or pain. ? Fluid or blood. ? Warmth. ? Pus or a bad smell.  Do not scratch your rash or pick at your blisters. To help avoid scratching: ? Keep your fingernails clean and cut short. ? Wear gloves or mittens while you sleep, if scratching is a problem. General instructions  Rest as told by your health care provider.  Keep all follow-up visits as told by your health care provider. This is important.  Wash your hands often with soap and water. If soap and water are not available, use hand sanitizer. Doing this lowers your chance of getting a bacterial skin infection.  Before your blisters change into scabs, your shingles infection can cause chickenpox in people who have never had it or have never been vaccinated against it. To prevent this from happening, avoid contact with other people, especially: ? Babies. ? Pregnant women. ? Children who have eczema. ? Elderly people who have transplants. ? People who have chronic illnesses, such as cancer or AIDS. Contact a health care provider if:  Your pain is not relieved with prescribed medicines.  Your pain does not get better after the rash heals.  You have signs of infection in the rash area, such as: ? More redness, swelling, or pain around the rash. ? Fluid or blood coming from the rash. ? The rash area feeling warm to the touch. ? Pus or a bad  smell coming from the rash. Get help right away if:  The rash is on your face or nose.  You have facial pain, pain around your eye area, or loss of feeling on one side of your face.  You have difficulty seeing.  You have ear pain or have ringing in your ear.  You have a loss of taste.  Your condition gets worse. Summary  Shingles, which is also known as herpes zoster, is an infection that causes a painful skin rash and fluid-filled blisters.  This condition is diagnosed with a skin exam. Skin or fluid samples may be taken from the blisters and examined before the diagnosis is made.  Keep your rash covered with a loose bandage (dressing). Wear loose-fitting clothing to help ease the pain of material rubbing against the rash.  Before your blisters change into scabs, your shingles infection can cause chickenpox in people who have never had it or have never been vaccinated against it. This information is not intended to replace advice given to you by your health care provider. Make sure you discuss any questions you have with your health care provider. Document Released: 08/01/2005 Document Revised: 11/23/2018 Document Reviewed: 04/05/2017 Elsevier Patient Education  2020 [employer].

## 2019-07-29 NOTE — Telephone Encounter (Signed)
Patient was replied through my chart message.

## 2019-07-29 NOTE — Telephone Encounter (Signed)
From [facility] 

## 2019-08-07 ENCOUNTER — Other Ambulatory Visit: Payer: Self-pay

## 2019-08-07 ENCOUNTER — Ambulatory Visit
Admission: RE | Admit: 2019-08-07 | Discharge: 2019-08-07 | Disposition: A | Discharge status: Home / Self Care | Payer: Medicare Other | Source: Ambulatory Visit | Attending: Physician Assistant | Admitting: Physician Assistant

## 2019-08-07 DIAGNOSIS — R0602 Shortness of breath: Secondary | ICD-10-CM | POA: Diagnosis not present

## 2019-08-07 DIAGNOSIS — J939 Pneumothorax, unspecified: Secondary | ICD-10-CM | POA: Diagnosis present

## 2019-08-07 DIAGNOSIS — J449 Chronic obstructive pulmonary disease, unspecified: Secondary | ICD-10-CM | POA: Insufficient documentation

## 2019-08-07 DIAGNOSIS — J95811 Postprocedural pneumothorax: Secondary | ICD-10-CM | POA: Diagnosis present

## 2019-08-07 DIAGNOSIS — C3431 Malignant neoplasm of lower lobe, right bronchus or lung: Secondary | ICD-10-CM | POA: Diagnosis present

## 2019-08-08 ENCOUNTER — Encounter: Payer: Self-pay | Admitting: Physician Assistant

## 2019-08-08 DIAGNOSIS — M546 Pain in thoracic spine: Secondary | ICD-10-CM

## 2019-08-12 MED ORDER — LIDOCAINE 5 % EX PTCH: 1.0000 | MEDICATED_PATCH | CUTANEOUS | 0 refills | Status: DC

## 2019-08-12 NOTE — Addendum Note (Signed)
Addended by: Mar Daring on: 08/12/2019 02:25 PM   Modules accepted: Orders

## 2019-08-12 NOTE — Addendum Note (Signed)
Addended by: Mar Daring on: 08/12/2019 12:29 PM   Modules accepted: Orders

## 2019-08-14 ENCOUNTER — Encounter: Payer: Self-pay | Admitting: Physician Assistant

## 2019-08-14 MED ORDER — IBUPROFEN 600 MG PO TABS: 600.0000 mg | ORAL_TABLET | Freq: Three times a day (TID) | ORAL | 1 refills | Status: DC | PRN

## 2019-08-14 NOTE — Addendum Note (Signed)
Addended by: Mar Daring on: 08/14/2019 04:00 PM   Modules accepted: Orders

## 2019-08-19 ENCOUNTER — Encounter: Payer: Self-pay | Admitting: Physician Assistant

## 2019-08-27 ENCOUNTER — Encounter: Payer: Self-pay | Admitting: Physician Assistant

## 2019-08-27 DIAGNOSIS — H00029 Hordeolum internum unspecified eye, unspecified eyelid: Secondary | ICD-10-CM

## 2019-08-27 IMAGING — DX DG CHEST 1V PORT
1 series · 1 of 1 positions shown · non-contrast
Comparison: Single-view of the chest 04/15/2018.

CLINICAL DATA: Status post bronchoscopy 04/12/2018. Status post
left chest tube removal today. Shortness of breath.

EXAM:
PORTABLE CHEST 1 VIEW

[chest ap]
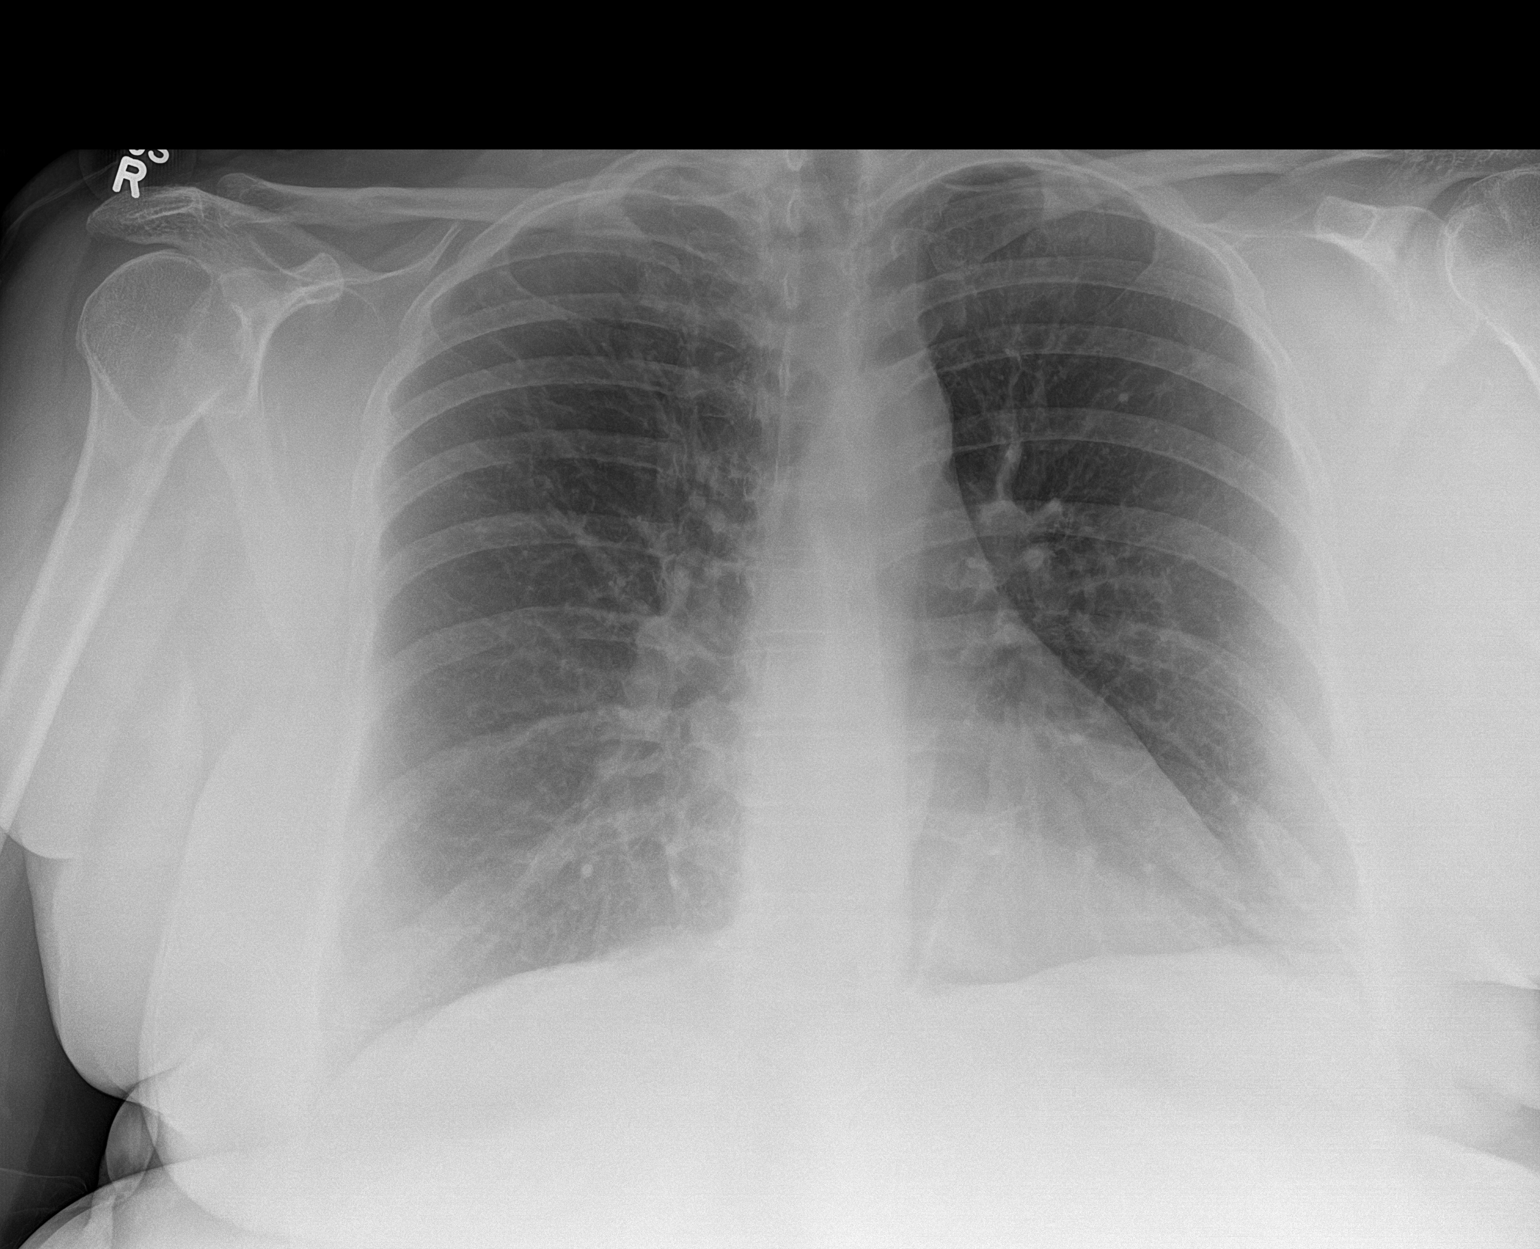

[1 of 1 positions shown; findings below may reference images not displayed]

FINDINGS: Pigtail catheter has been removed from the left chest. No
pneumothorax is identified. The lungs are emphysematous but appear
clear. Heart size is normal. No pneumothorax or pleural effusion.
IMPRESSION: Left chest tube has been removed. Negative for pneumothorax. No
acute disease.

## 2019-08-27 MED ORDER — ERYTHROMYCIN 5 MG/GM OP OINT: TOPICAL_OINTMENT | OPHTHALMIC | 0 refills | Status: DC

## 2019-08-29 IMAGING — DX DG CHEST 1V PORT
1 series · 1 of 1 positions shown · non-contrast
Comparison: 4136 hours on the same day

CLINICAL DATA: Chest tube placement for pneumothorax.

EXAM:
PORTABLE CHEST 1 VIEW

[chest ap]
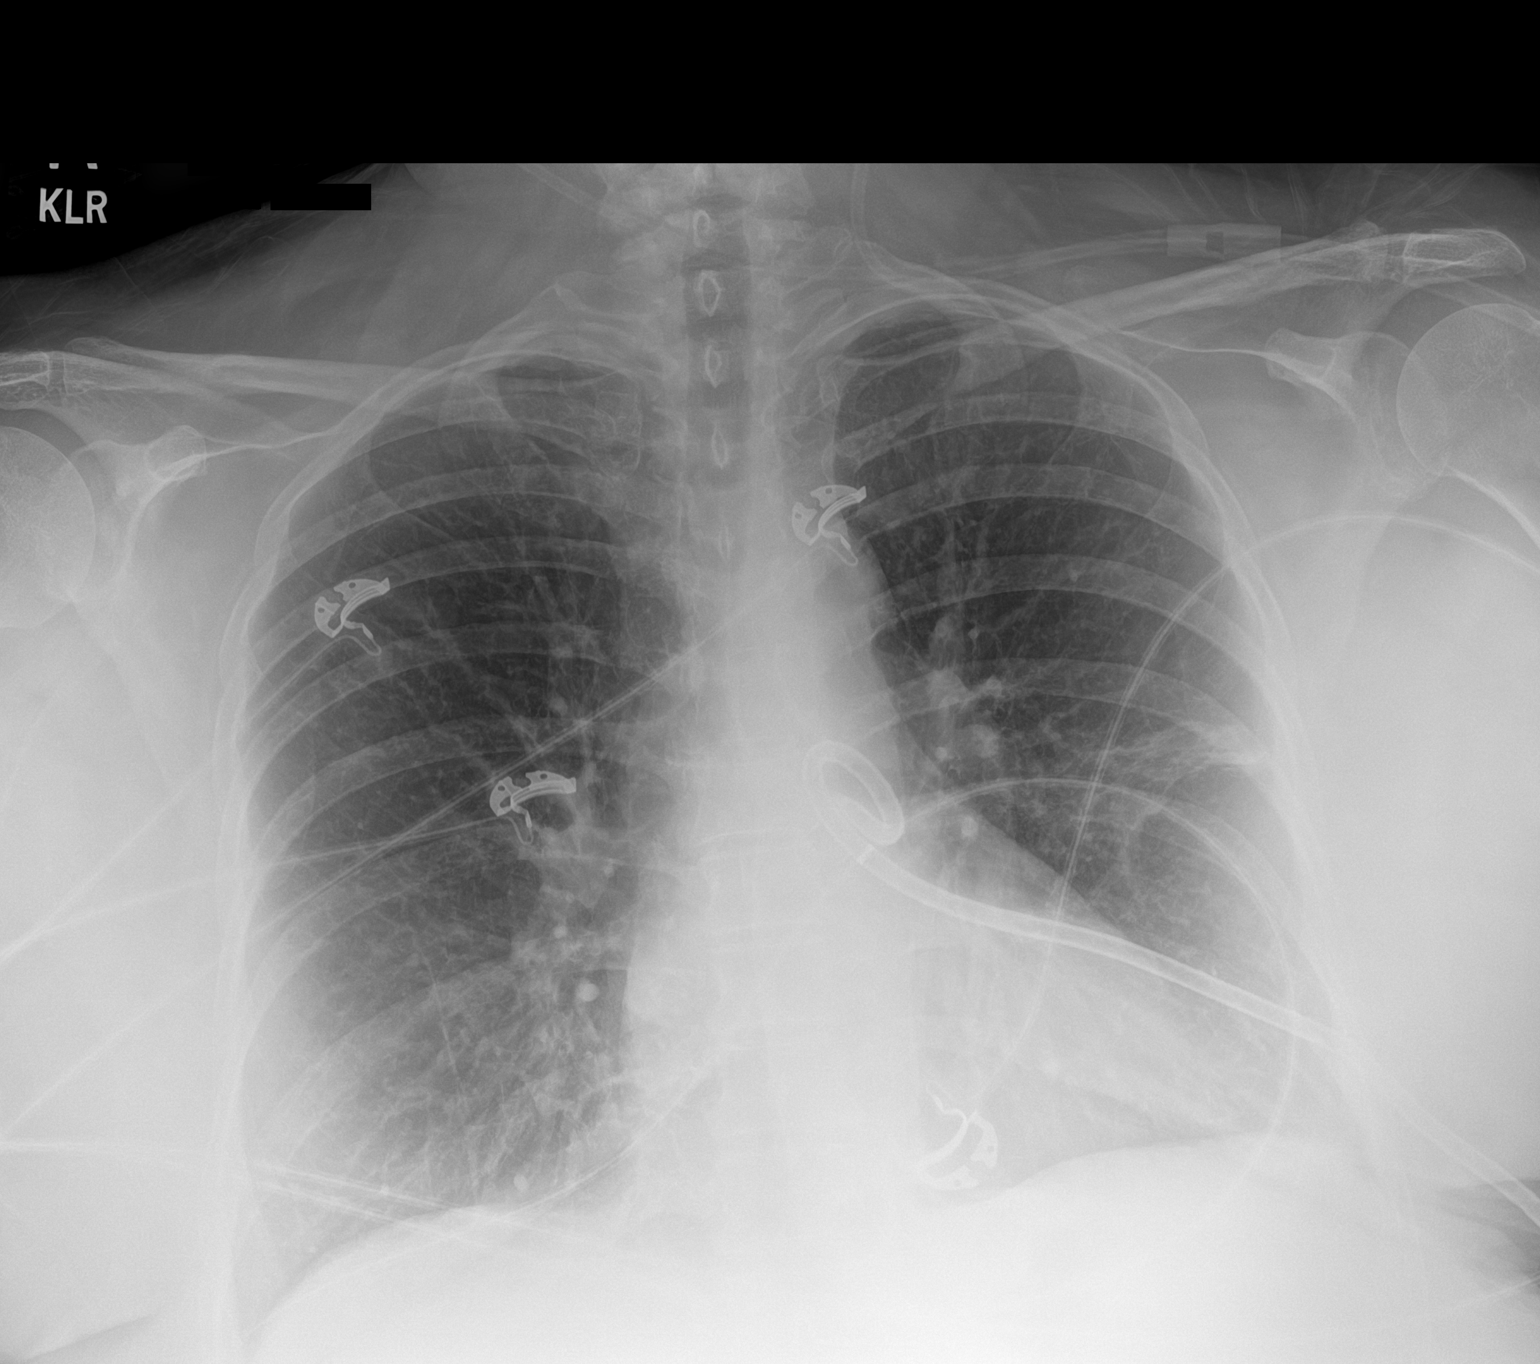

[1 of 1 positions shown; findings below may reference images not displayed]

FINDINGS: Portable AP semi upright view of the chest. New pigtail catheter
projects over the medial left mid thorax with re-expansion of the
left upper lobe. No apparent pneumothorax on current exam.
Subsegmental atelectasis is seen in the left mid lung. Right lung is
clear. No mediastinal shift. Heart and mediastinal contours are
stable.
IMPRESSION: New left-sided chest tube projects over the medial mid left thorax
with interval improvement and re-expansion of the left upper lobe. A
pneumothorax is currently not identified.

## 2019-09-01 IMAGING — CR DG CHEST 2V
1 series · 2 of 2 positions shown · non-contrast
Comparison: One-view chest x-ray 04/20/2018

CLINICAL DATA: Left-sided chest tube.  Lung cancer.

EXAM:
CHEST - 2 VIEW

[Series 1: dg chest 2 view · 0.14mm/px · 2 of 2 slices shown]
[im 1/2]
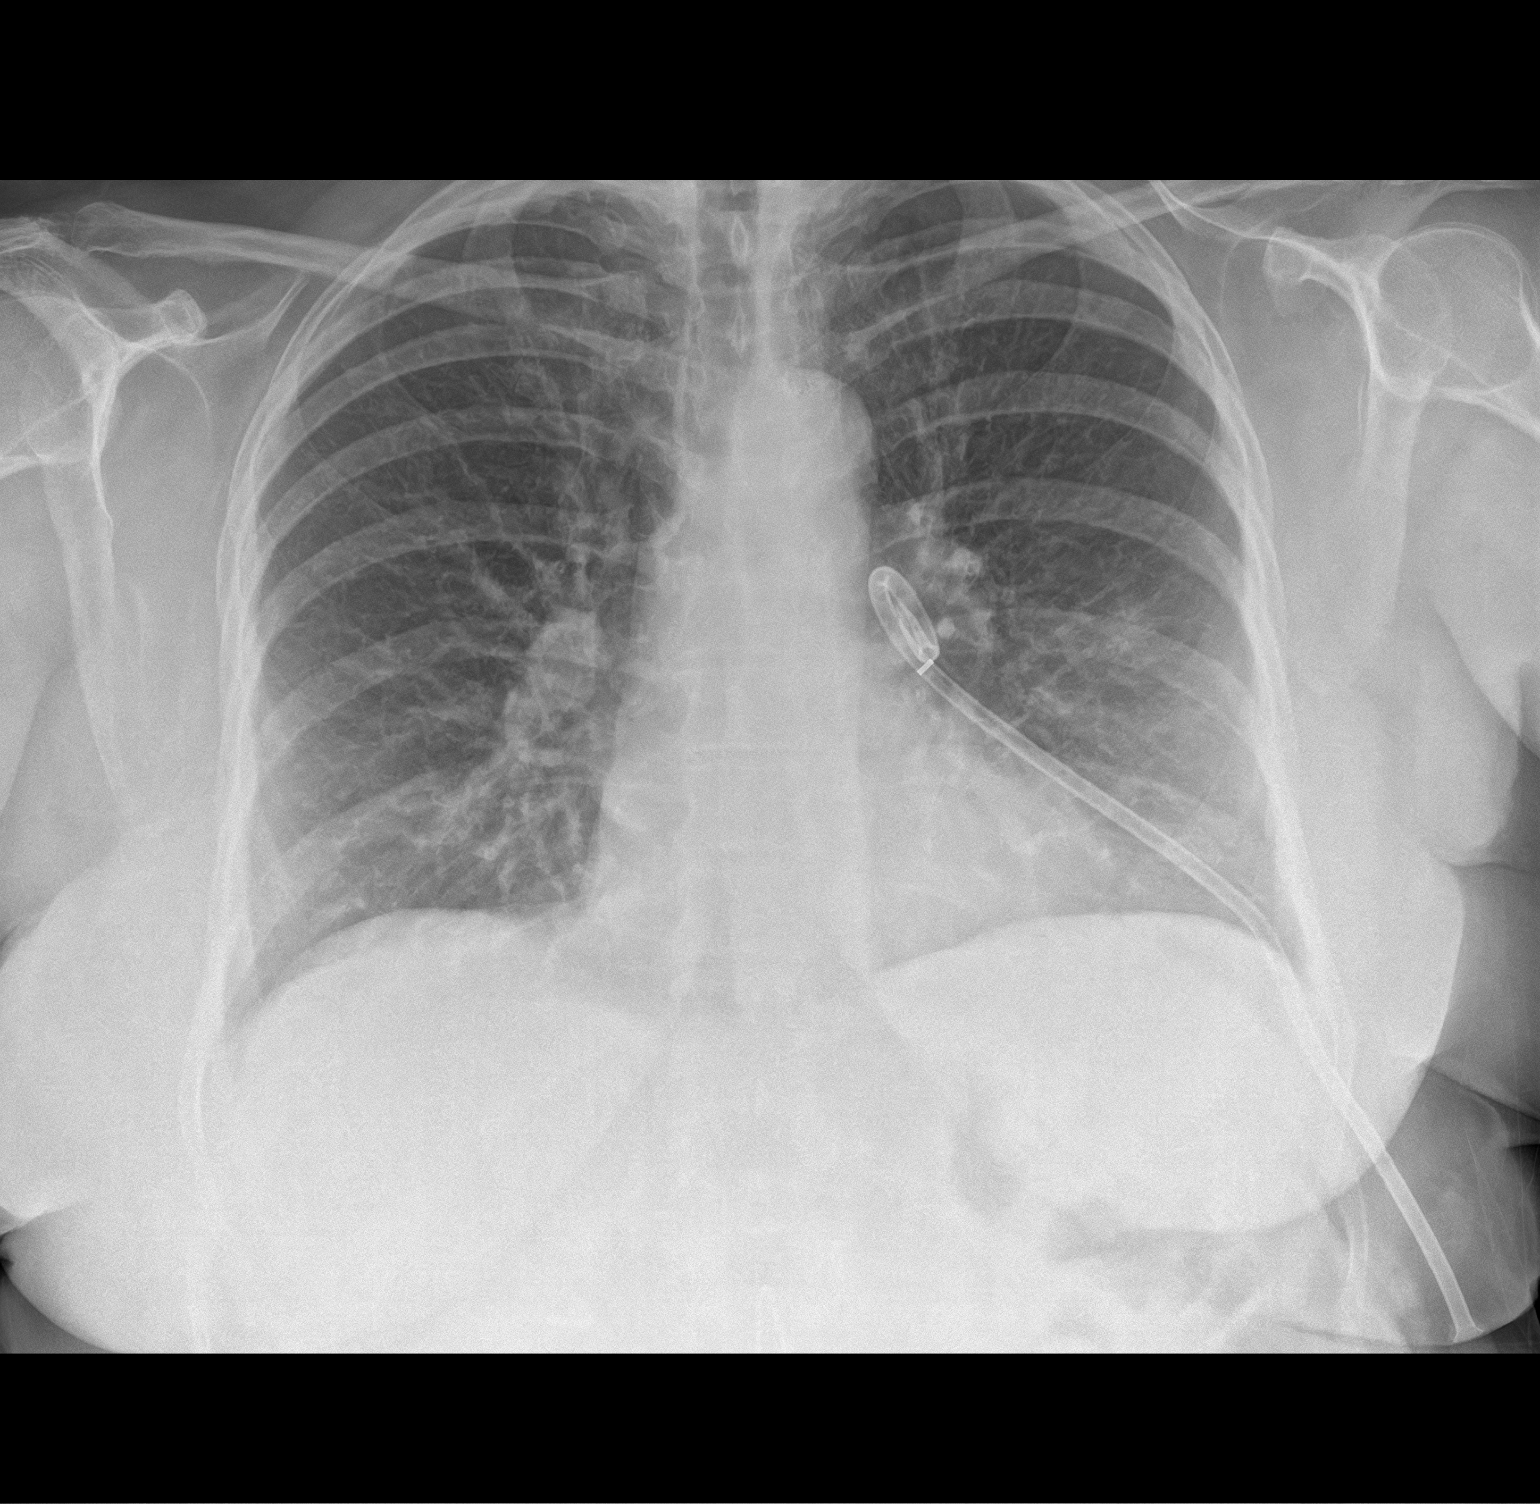
[im 2/2]
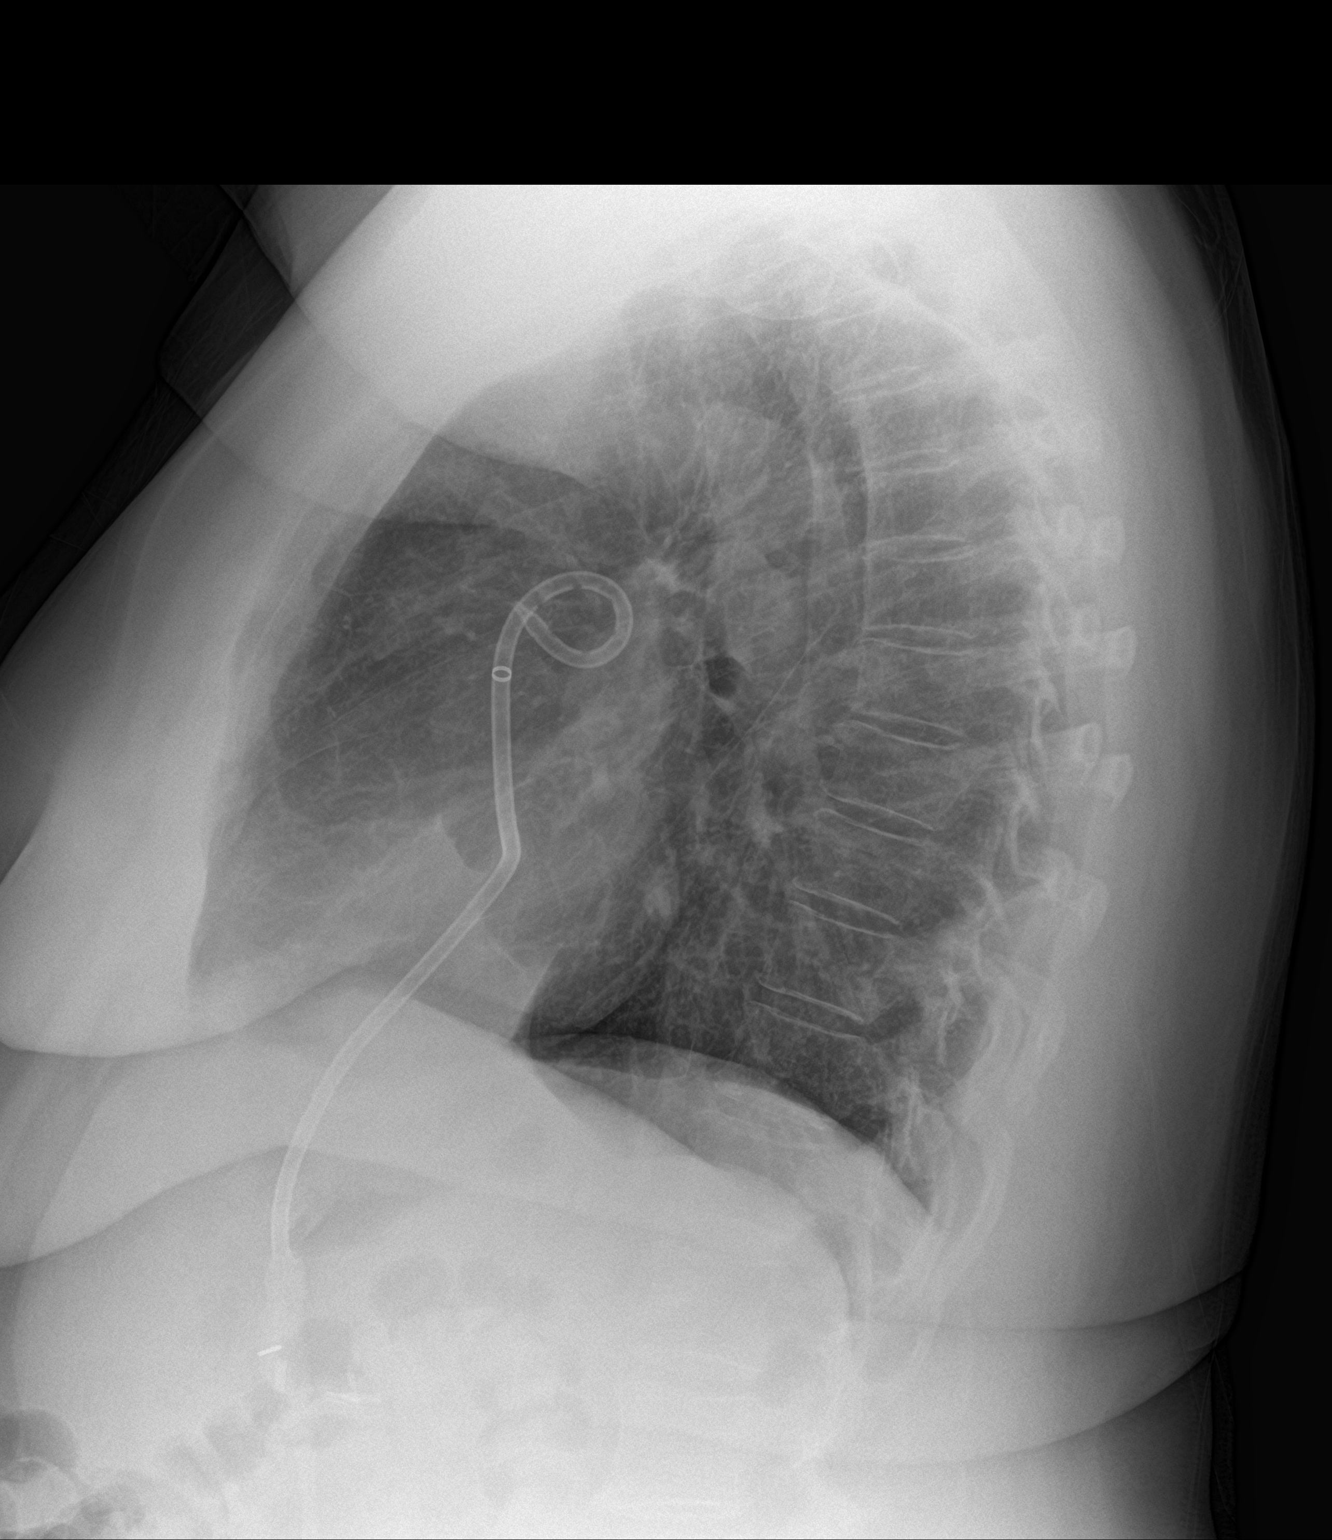

[2 of 2 positions shown; findings below may reference images not displayed]

FINDINGS: The heart size is normal. There is no edema or effusion. A
left-sided pigtail chest tube is in place. There is no pneumothorax.

Known ground-glass nodule in the left lower lobe is again seen. No
new nodule or airspace disease is present. The visualized soft
tissues and bony thorax are unremarkable.
IMPRESSION: 1. Left chest tube in stable position.
2. No pneumothorax.

## 2019-09-02 IMAGING — DX DG CHEST 1V PORT
1 series · 1 of 1 positions shown · non-contrast
Comparison: 04/21/2018

CLINICAL DATA: Follow-up left pneumothorax.

EXAM:
PORTABLE CHEST 1 VIEW

[chest ap]
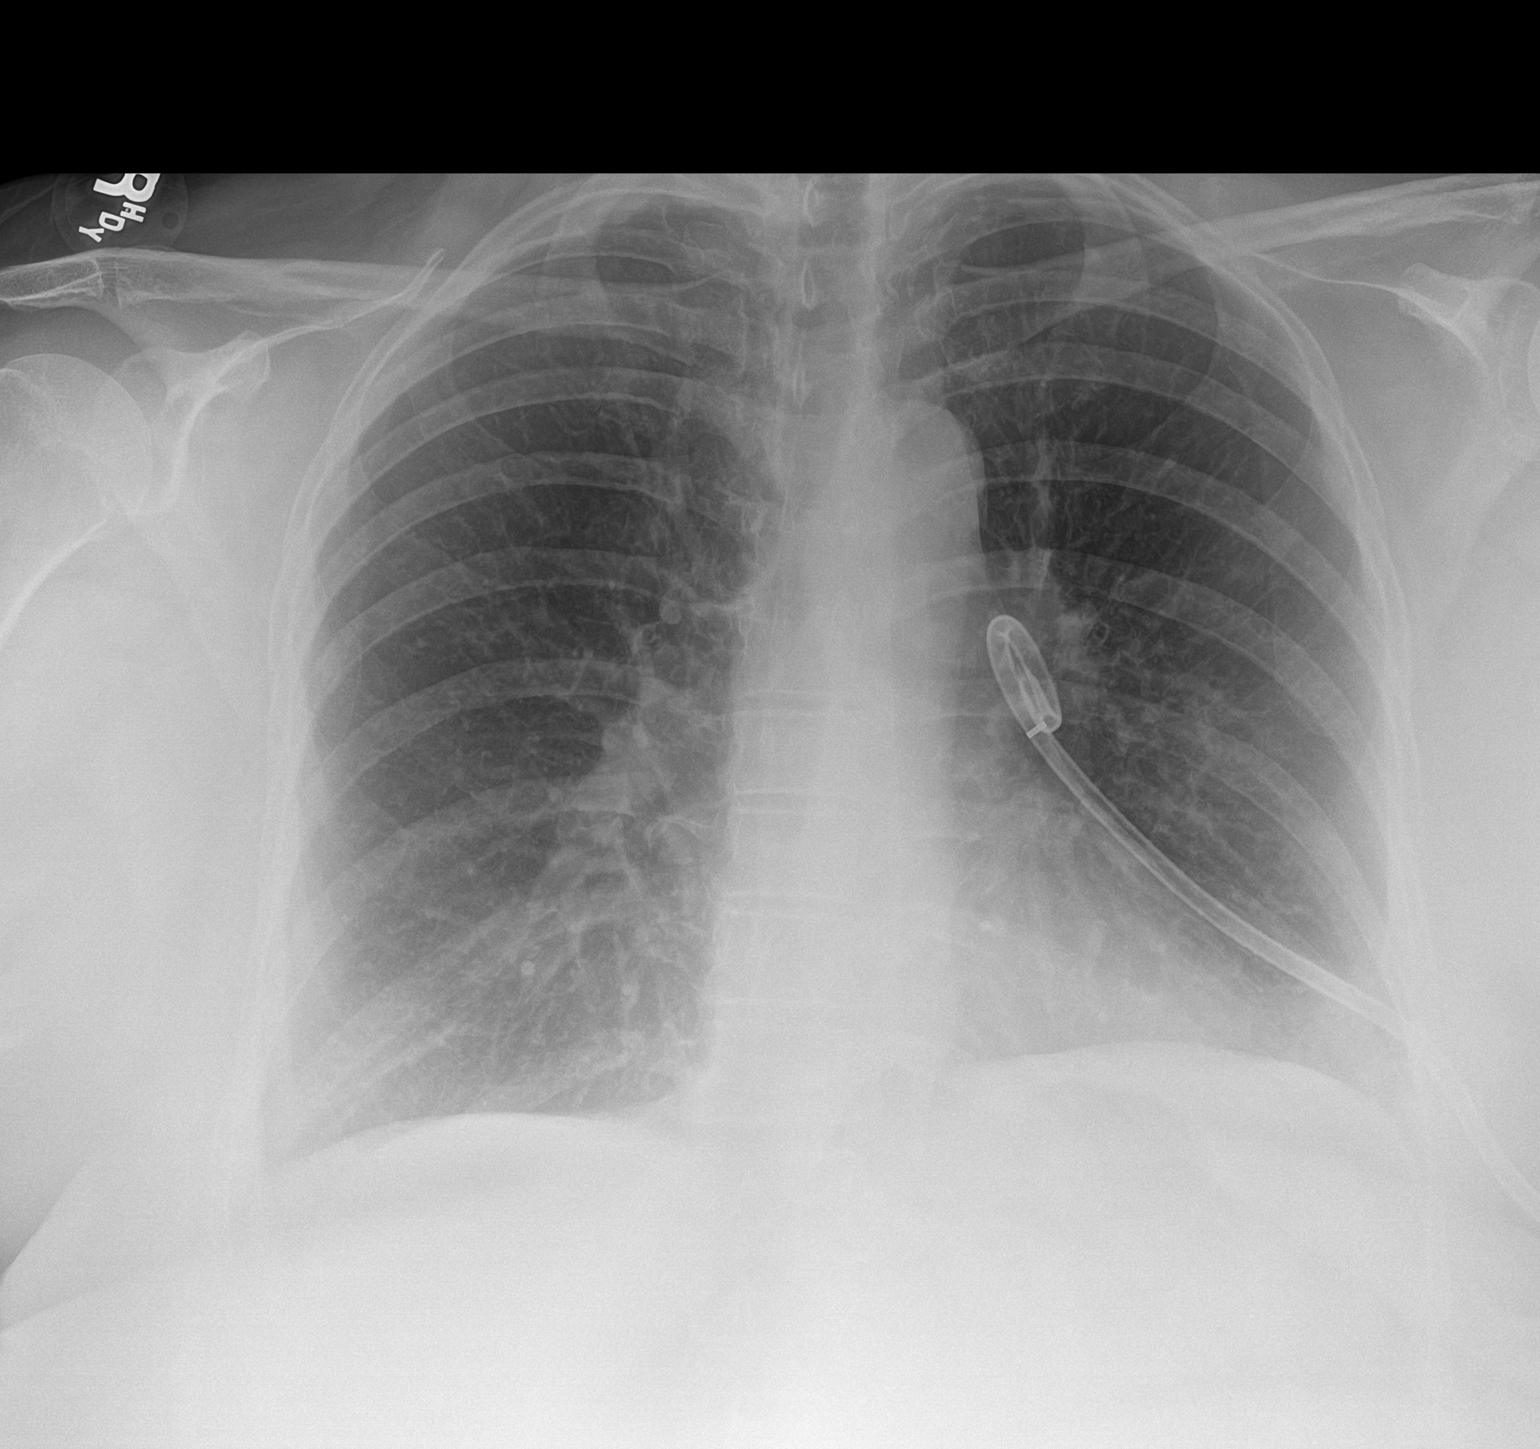

[1 of 1 positions shown; findings below may reference images not displayed]

FINDINGS: The heart size and mediastinal contours are within normal limits.
Left pleural catheter remains in place and no pneumothorax is
visualized. No evidence of pulmonary infiltrate or pleural effusion.
IMPRESSION: Left pleural catheter remains in place. No pneumothorax or other
acute findings.

## 2019-09-03 IMAGING — DX DG CHEST 1V PORT
1 series · 1 of 1 positions shown · non-contrast
Comparison: Portable chest x-ray April 22, 2018

CLINICAL DATA: Follow-up pneumothorax

EXAM:
PORTABLE CHEST 1 VIEW

[chest ap]
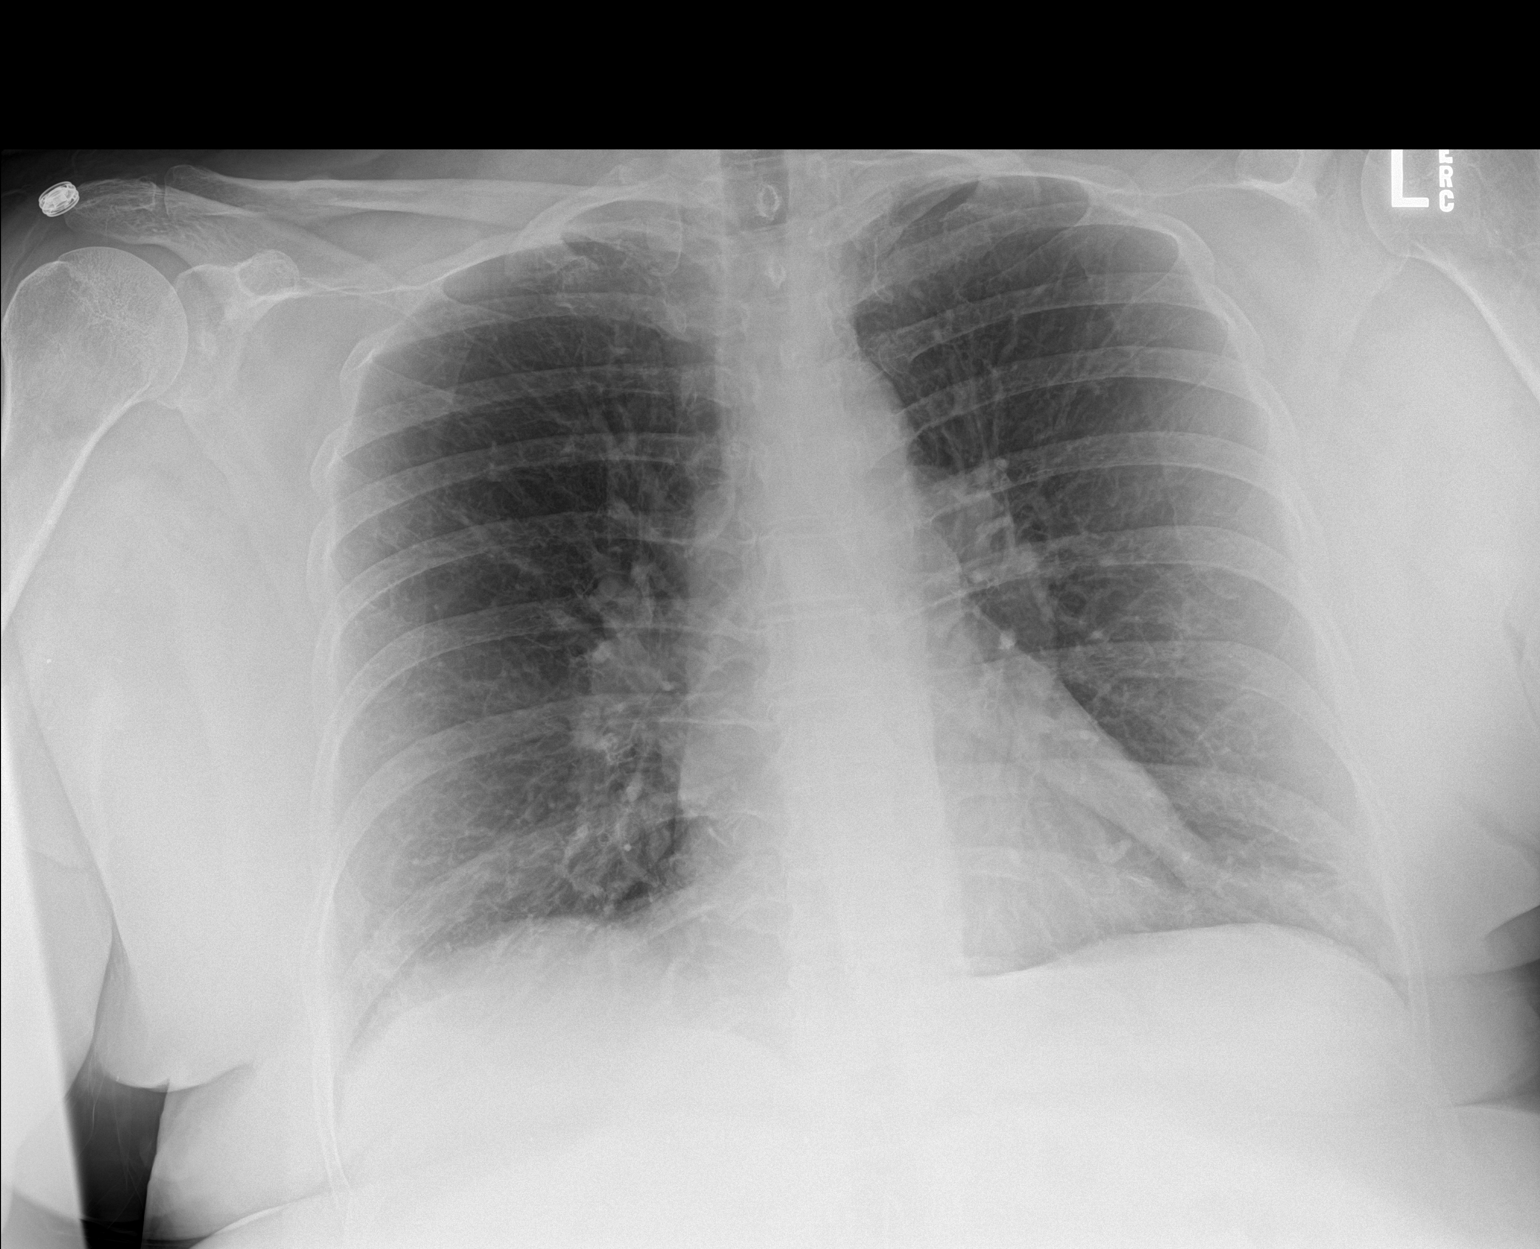

[1 of 1 positions shown; findings below may reference images not displayed]

FINDINGS: There has been interval removal of the pigtail pleural catheter on
the left. No recurrent pneumothorax is observed. There is no pleural
effusion. The interstitial markings of both lungs are mildly
prominent though stable. The heart and pulmonary vascularity are
normal. The bony thorax is unremarkable.
IMPRESSION: No recurrent pneumothorax following removal of the left chest tube.
No acute cardiopulmonary abnormality.

## 2019-09-04 ENCOUNTER — Encounter: Payer: Self-pay | Admitting: Physician Assistant

## 2019-09-04 DIAGNOSIS — R109 Unspecified abdominal pain: Secondary | ICD-10-CM

## 2019-09-04 DIAGNOSIS — G8929 Other chronic pain: Secondary | ICD-10-CM

## 2019-09-06 ENCOUNTER — Telehealth: Payer: Self-pay | Admitting: Physician Assistant

## 2019-09-06 NOTE — Telephone Encounter (Signed)
Patient is calling to request Anderson [facility] Burnette's email address to send a file that will not upload on mychart. Please advise [phone]

## 2019-09-06 NOTE — Addendum Note (Signed)
Addended by: Mar Daring on: 09/06/2019 04:19 PM   Modules accepted: Orders

## 2019-09-09 NOTE — Telephone Encounter (Signed)
Michelle Pitts,  Can you call her and offer her an appt. I feel she may need 3min.  JB

## 2019-09-12 ENCOUNTER — Encounter: Payer: Self-pay | Admitting: Physician Assistant

## 2019-09-12 ENCOUNTER — Encounter: Payer: Self-pay | Admitting: *Deleted

## 2019-09-12 ENCOUNTER — Ambulatory Visit: Payer: Medicare PPO | Admitting: Physician Assistant

## 2019-09-12 ENCOUNTER — Other Ambulatory Visit: Payer: Self-pay

## 2019-09-12 VITALS — BP 158/79 | HR 83 | Temp 97.2°F | Resp 16 | Wt 258.0 lb

## 2019-09-12 DIAGNOSIS — M791 Myalgia, unspecified site: Secondary | ICD-10-CM

## 2019-09-12 DIAGNOSIS — H0012 Chalazion right lower eyelid: Secondary | ICD-10-CM | POA: Diagnosis not present

## 2019-09-12 DIAGNOSIS — M546 Pain in thoracic spine: Secondary | ICD-10-CM | POA: Diagnosis not present

## 2019-09-12 DIAGNOSIS — L603 Nail dystrophy: Secondary | ICD-10-CM

## 2019-09-12 DIAGNOSIS — G8929 Other chronic pain: Secondary | ICD-10-CM

## 2019-09-12 NOTE — Patient Instructions (Signed)
Chalazion  A chalazion is a swelling or lump on the eyelid. It can affect the upper eyelid or the lower eyelid. What are the causes? This condition may be caused by:  Long-lasting (chronic) inflammation of the eyelid glands.  A blocked oil gland in the eyelid. What are the signs or symptoms? Symptoms of this condition include:  Swelling of the eyelid. The swelling may spread to areas around the eye.  A hard lump on the eyelid.  Blurry vision. The lump on the eyelid may make it hard to see out of the eye. How is this diagnosed? This condition is diagnosed with an examination of the eye. How is this treated? This condition is treated by applying a warm compress to the eyelid. If the condition does not improve, it may be treated with:  Medicine that is injected into the chalazion by a health care provider.  Surgery.  Medicine that is applied to the eye. Follow these instructions at home: Managing pain and swelling  Apply a warm, moist compress to the eyelid 4-6 times a day for 10-15 minutes at a time. This will help to open any blocked glands and to reduce redness and swelling.  Apply over-the-counter and prescription medicines only as told by your health care provider. General instructions  Do not touch the chalazion.  Do not try to remove the pus. Do not squeeze the chalazion or stick it with a pin or needle.  Do not rub your eyes.  Wash your hands often. Dry your hands with a clean towel.  Keep your face, scalp, and eyebrows clean.  Avoid wearing eye makeup.  If the chalazion does not break open (rupture) on its own, return to your health care provider.  Keep all follow-up appointments as told by your health care provider. This is important. Contact a health care provider if:  Your eyelid has not improved in 4 weeks.  Your eyelid is getting worse.  You have a fever.  The chalazion does not rupture on its own after a month of home treatment. Get help right  away if:  You have pain in your eye.  Your vision changes.  The chalazion becomes painful or red.  The chalazion gets bigger. Summary  A chalazion is a swelling or lump on the upper or lower eyelid.  It may be caused by chronic inflammation or a blocked oil gland.  Apply a warm, moist compress to the eyelid 4-6 times a day for 10-15 minutes at a time.  Keep your face, scalp, and eyebrows clean. This information is not intended to replace advice given to you by your health care provider. Make sure you discuss any questions you have with your health care provider. Document Revised: 01/18/2018 Document Reviewed: 01/18/2018 Elsevier Patient Education  [place].

## 2019-09-12 NOTE — Progress Notes (Signed)
Patient: Michelle Pitts Female    DOB: 1950-11-15   69 y.o.   MRN: [id number] Visit Date: 09/12/2019  Today's Provider: Mar Daring, PA-C   Chief Complaint  Patient presents with  . Follow-up   Subjective:     HPI  Michelle Pitts is a 69 yr old female that comes to the office today for a "cyst" on her eyelid that has not responded to conservative measures. She has tried warm compresses, erythromycin ointment and has seen an ophthalmologist. The ophthalmologist said he couldn't see anything.   She also reports just feeling off, feeling like she is having chronic inflammation everywhere and also has had nail changes. She does have family history of autoimmune disorders and personally has history of 3 primary cancers (vulvular, breast and lung).   Allergies  Allergen Reactions  . Codeine Nausea And Vomiting  . Gramineae Pollens     sinus  . Hydrochlorothiazide Swelling and Other (See Comments)    Swelling of tongue.  [place] [Levofloxacin In D5w] Hives, Swelling and Other (See Comments)    Leg swelling      Current Outpatient Medications:  .  erythromycin ophthalmic ointment, Apply to affected eye once daily at bedtime x 5-7 days, Disp: 3.5 g, Rfl: 0 .  Fluocinolone Acetonide 0.01 % OIL, Place 1 drop in ear(s) daily., Disp: 20 mL, Rfl: 0 .  ibuprofen (ADVIL) 600 MG tablet, Take 1 tablet (600 mg total) by mouth every 8 (eight) hours as needed., Disp: 90 tablet, Rfl: 1 .  metoprolol succinate (TOPROL-XL) 25 MG 24 hr tablet, Take 1 tablet (25 mg total) by mouth daily., Disp: 90 tablet, Rfl: 3 .  Multiple Vitamins-Minerals (VITAMIN D3 COMPLETE PO), Take by mouth., Disp: , Rfl:   Review of Systems  Constitutional: Negative.   HENT: Negative.   Eyes: Negative for photophobia, pain, discharge, redness, itching and visual disturbance.       Small lump on right lower eyelid  Respiratory: Negative.   Cardiovascular: Negative.   Gastrointestinal: Negative.     Musculoskeletal: Positive for back pain.  Neurological: Positive for numbness.    Social History   Tobacco Use  . Smoking status: Former Smoker    Packs/day: 1.00    Years: 50.00    Pack years: 50.00    Types: Cigarettes    Quit date: 09/15/2014    Years since quitting: 4.9  . Smokeless tobacco: Never Used  Substance Use Topics  . Alcohol use: Yes    Alcohol/week: 14.0 standard drinks    Types: 14 Cans of beer per week      Objective:   BP (!) 158/79 (BP Location: Left Arm, Patient Position: Sitting, Cuff Size: Large)   Pulse 83   Temp (!) 97.2 F (36.2 C) (Temporal)   Resp 16   Wt 258 lb (117 kg)   BMI 43.60 kg/m  Vitals:   09/12/19 1652  BP: (!) 158/79  Pulse: 83  Resp: 16  Temp: (!) 97.2 F (36.2 C)  TempSrc: Temporal  Weight: 258 lb (117 kg)  Body mass index is 43.6 kg/m.     Physical Exam Vitals reviewed.  Constitutional:      Appearance: She is well-developed.  HENT:     Head: Normocephalic and atraumatic.  Eyes:     General: Vision grossly intact. No scleral icterus.    Extraocular Movements: Extraocular movements intact.     Conjunctiva/sclera: Conjunctivae normal.     Pupils: Pupils  are equal, round, and reactive to light.   Pulmonary:     Effort: Pulmonary effort is normal. No respiratory distress.  Musculoskeletal:     Cervical back: Normal range of motion and neck supple.  Psychiatric:        Mood and Affect: Mood normal.        Behavior: Behavior normal.        Thought Content: Thought content normal.        Judgment: Judgment normal.              No results found for any visits on 09/12/19.     Assessment & Plan    1. Myalgia Progressively worsening slowly. Family history of autoimmune disorders. Will check labs as below and f/u pending results. - CBC w/Diff/Platelet - Comprehensive Metabolic Panel (CMET) - TSH - ANA,IFA RA Diag Pnl w/rflx Tit/Patn - Sed Rate (ESR) - C-reactive protein  2. Chronic left-sided  thoracic back pain Has been responding to Gabapentin and Ibuprofen. If she does not take it comes back. Felt to be secondary to radiation therapy causing nerve pain. Has seen Neurosurgery and not felt to be from thoracic spine. MRI is in [place]. Referral has been placed to Physical medicine to see if other long term pain management options are available or if sticking with Gabapentin since it is working is best option. Will check labs as below and f/u pending results. - CBC w/Diff/Platelet - Comprehensive Metabolic Panel (CMET) - TSH - ANA,IFA RA Diag Pnl w/rflx Tit/Patn - Sed Rate (ESR) - C-reactive protein  3. Chalazion of right lower eyelid Suspected to be small chalazion or sebaceous cyst. Referral placed for [facility] as it has not responded to conservative treatments.   4. Brittle nails Will check labs as below and f/u pending results. - CBC w/Diff/Platelet - Comprehensive Metabolic Panel (CMET) - TSH - ANA,IFA RA Diag Pnl w/rflx Tit/Patn - Sed Rate (ESR) - C-reactive protein     Mar Daring, PA-C  [facility]

## 2019-09-16 ENCOUNTER — Encounter: Payer: Self-pay | Admitting: Physician Assistant

## 2019-09-17 ENCOUNTER — Telehealth: Payer: Self-pay

## 2019-09-17 DIAGNOSIS — M791 Myalgia, unspecified site: Secondary | ICD-10-CM

## 2019-09-17 LAB — C-REACTIVE PROTEIN: CRP: 12 mg/L — ABNORMAL HIGH (ref 0–10)

## 2019-09-17 LAB — CBC WITH DIFFERENTIAL/PLATELET
Basophils Absolute: 0.1 10*3/uL (ref 0.0–0.2)
Basos: 1 %
EOS (ABSOLUTE): 0.1 10*3/uL (ref 0.0–0.4)
Eos: 2 %
Hematocrit: 39.7 % (ref 34.0–46.6)
Hemoglobin: 13.2 g/dL (ref 11.1–15.9)
Immature Grans (Abs): 0 10*3/uL (ref 0.0–0.1)
Immature Granulocytes: 0 %
Lymphocytes Absolute: 1.2 10*3/uL (ref 0.7–3.1)
Lymphs: 26 %
MCH: 31.1 pg (ref 26.6–33.0)
MCHC: 33.2 g/dL (ref 31.5–35.7)
MCV: 93 fL (ref 79–97)
Monocytes Absolute: 0.4 10*3/uL (ref 0.1–0.9)
Monocytes: 9 %
Neutrophils Absolute: 2.8 10*3/uL (ref 1.4–7.0)
Neutrophils: 62 %
Platelets: 213 10*3/uL (ref 150–450)
RBC: 4.25 x10E6/uL (ref 3.77–5.28)
RDW: 13 % (ref 11.7–15.4)
WBC: 4.5 10*3/uL (ref 3.4–10.8)

## 2019-09-17 LAB — COMPREHENSIVE METABOLIC PANEL
ALT: 37 IU/L — ABNORMAL HIGH (ref 0–32)
AST: 30 IU/L (ref 0–40)
Albumin/Globulin Ratio: 1.8 (ref 1.2–2.2)
Albumin: 4.3 g/dL (ref 3.8–4.8)
Alkaline Phosphatase: 140 IU/L — ABNORMAL HIGH (ref 39–117)
BUN/Creatinine Ratio: 15 (ref 12–28)
BUN: 12 mg/dL (ref 8–27)
Bilirubin Total: 0.4 mg/dL (ref 0.0–1.2)
CO2: 22 mmol/L (ref 20–29)
Calcium: 10 mg/dL (ref 8.7–10.3)
Chloride: 102 mmol/L (ref 96–106)
Creatinine, Ser: 0.81 mg/dL (ref 0.57–1.00)
GFR calc Af Amer: 85 mL/min/{1.73_m2} (ref >59)
GFR calc non Af Amer: 74 mL/min/{1.73_m2} (ref >59)
Globulin, Total: 2.4 g/dL (ref 1.5–4.5)
Glucose: 107 mg/dL — ABNORMAL HIGH (ref 65–99)
Potassium: 5 mmol/L (ref 3.5–5.2)
Sodium: 139 mmol/L (ref 134–144)
Total Protein: 6.7 g/dL (ref 6.0–8.5)

## 2019-09-17 LAB — SEDIMENTATION RATE: Sed Rate: 24 mm/hr (ref 0–40)

## 2019-09-17 LAB — ANA,IFA RA DIAG PNL W/RFLX TIT/PATN
ANA Titer 1: NEGATIVE
Cyclic Citrullin Peptide Ab: 57 units — ABNORMAL HIGH (ref 0–19)
Rheumatoid fact SerPl-aCnc: <10 IU/mL (ref 0.0–13.9)

## 2019-09-17 LAB — TSH: TSH: 3 u[IU]/mL (ref 0.450–4.500)

## 2019-09-17 NOTE — Telephone Encounter (Signed)
-----   Message from Mar Daring, [place] sent at 09/17/2019  2:04 PM EST ----- Blood count is normal. Kidney function is normal. Liver enzymes are improving. Sodium, potassium and calcium are normal. Thyroid is normal. Autoimmune panel tested negative and rheumatoid factor was negative. However the CCP antibodies was moderately positive. This lab can be indicative of rheumatoid arthritis. Also one of the inflammatory markers was very slightly elevated. Sed rate was normal (this is another inflammatory marker). I would recommend a referral to a rheumatologist for further evaluation.

## 2019-09-17 NOTE — Telephone Encounter (Signed)
Comment seen by patient Michelle Pitts on 09/17/2019 3:08 PM EST

## 2019-09-17 NOTE — Telephone Encounter (Signed)
Patient agreed to referral.

## 2020-01-01 ENCOUNTER — Other Ambulatory Visit: Payer: Self-pay | Admitting: General Surgery

## 2020-01-01 DIAGNOSIS — C50312 Malignant neoplasm of lower-inner quadrant of left female breast: Secondary | ICD-10-CM

## 2020-01-13 ENCOUNTER — Other Ambulatory Visit: Payer: Self-pay | Admitting: Physician Assistant

## 2020-01-13 DIAGNOSIS — M546 Pain in thoracic spine: Secondary | ICD-10-CM

## 2020-02-10 ENCOUNTER — Ambulatory Visit
Admission: RE | Admit: 2020-02-10 | Discharge: 2020-02-10 | Disposition: A | Discharge status: Home / Self Care | Payer: Medicare PPO | Source: Ambulatory Visit | Attending: General Surgery | Admitting: General Surgery

## 2020-02-10 DIAGNOSIS — Z853 Personal history of malignant neoplasm of breast: Secondary | ICD-10-CM | POA: Diagnosis not present

## 2020-02-10 DIAGNOSIS — Z1231 Encounter for screening mammogram for malignant neoplasm of breast: Secondary | ICD-10-CM | POA: Diagnosis not present

## 2020-02-10 DIAGNOSIS — C50312 Malignant neoplasm of lower-inner quadrant of left female breast: Secondary | ICD-10-CM

## 2020-02-12 ENCOUNTER — Telehealth: Payer: Self-pay

## 2020-02-12 ENCOUNTER — Ambulatory Visit: Payer: Self-pay | Admitting: Physician Assistant

## 2020-02-12 NOTE — Telephone Encounter (Signed)
Apt scheduled for 02/13/2020; pt advised.   Thanks,   -Mickel Baas

## 2020-02-12 NOTE — Telephone Encounter (Signed)
She can go on my schedule at 3 pm tomorrow. Leave her 40 min due to acuity.  If symptoms worsen she needs to go to [facility] or ER

## 2020-02-12 NOTE — Telephone Encounter (Signed)
Copied from [place] [id number]. Topic: General - Other >> Feb 12, 2020 12:06 PM Erick Blinks wrote: Reason for CRM: Pt called back to report that she received an automated message regarding [employer], she also states that the message includes a phone number to call to resolve this. This phone number connected her to the [facility] in [place], [phone].

## 2020-02-12 NOTE — Telephone Encounter (Signed)
Please advise 

## 2020-02-12 NOTE — Telephone Encounter (Signed)
Pt. Called to report swelling in bilat LE's from feet to above knees, with Left > right, for past 7 days. Denied any redness, drainage, warmth, or pain of legs.  Denied facial or hand swelling.  Stated she has some shortness of breath with activity, that improves with rest.  Reported she has COPD, and has SOB with increased humidity.   Pt. Also reported episodes of falling asleep without intentionally trying to fall asleep.  She will wake up after about 10 min. To as long as 2.5 hrs.  Awakens somewhat confused.  Reported the episodes are occurring daily, and have increased in frequency. Stated she has had it occur more than once per day, at times.  Denied any associated symptoms with falling asleep unintentionally; denied sweating, dizziness, heart palpitations, or any other complaints.  Advised pt. That due to the severity of leg swelling, she should be evaluated today.  Further advised that she should go to UC or the ER.  Pt. Refused to go to either UC or ER today.  Due to SOB, the decision tree directed pt. To do a MyChart Video visit.  Pt. Stated she does not have the capability to doing a video visit.  Noted that [facility] Malling does not have any available appts. for MyChart visit until 02/21/20.  Call placed to the office; unable to connect with office staff during lunch.  Advised pt. Will send triage note to office with high priority.  Encouraged pt. To await call back from office for further recommendations.  Also advised to call back if her symptoms worsen.  Verb. Understanding.      Reason for Disposition . SEVERE leg swelling (e.g., swelling extends above knee, entire leg is swollen, weeping fluid)  Answer Assessment - Initial Assessment Questions 1. ONSET: "When did the swelling start?" (e.g., minutes, hours, days)     About 7 days  2. LOCATION: "What part of the leg is swollen?"  "Are both legs swollen or just one leg?"     Bilat. Feet, ankles, legs to above the knees; with left > right 3.  SEVERITY: "How bad is the swelling?" (e.g., localized; mild, moderate, severe)  - Localized - small area of swelling localized to one leg  - MILD pedal edema - swelling limited to foot and ankle, pitting edema < 1/4 inch (6 mm) deep, rest and elevation eliminate most or all swelling  - MODERATE edema - swelling of lower leg to knee, pitting edema > 1/4 inch (6 mm) deep, rest and elevation only partially reduce swelling  - SEVERE edema - swelling extends above knee, facial or hand swelling present      Severe in legs ; denied swelling in face or hands  4. REDNESS: "Does the swelling look red or infected?"     Denied redness 5. PAIN: "Is the swelling painful to touch?" If Yes, ask: "How painful is it?"   (Scale 1-10; mild, moderate or severe)     Denied pain 6. FEVER: "Do you have a fever?" If Yes, ask: "What is it, how was it measured, and when did it start?"      denied 7. CAUSE: "What do you think is causing the leg swelling?"     Unknown- happened once before as reaction to medication. 8. MEDICAL HISTORY: "Do you have a history of heart failure, kidney disease, liver failure, or cancer?"     COPD; hx of breast and lung cancer 9. RECURRENT SYMPTOM: "Have you had leg swelling before?" If Yes, ask: "  When was the last time?" "What happened that time?"     See # 7 10. OTHER SYMPTOMS: "Do you have any other symptoms?" (e.g., chest pain, difficulty breathing)       Some shortness of breath with activity; stated happens with increased humidity; recovers with rest; denied chest discomfort. ; reported she sits down and falls asleep and may be asleep for anywhere from 10 min-2.5 hrs.; stated is a little confused when she awakens, due to not remembering going to sleep.  11. PREGNANCY: "Is there any chance you are pregnant?" "When was your last menstrual period?"       N/a  Protocols used: LEG SWELLING AND EDEMA-A-AH

## 2020-02-13 ENCOUNTER — Encounter: Payer: Self-pay | Admitting: Physician Assistant

## 2020-02-13 ENCOUNTER — Other Ambulatory Visit: Payer: Self-pay

## 2020-02-13 ENCOUNTER — Ambulatory Visit: Payer: Medicare PPO | Admitting: Physician Assistant

## 2020-02-13 VITALS — BP 128/85 | HR 97 | Temp 97.4°F | Resp 16 | Wt 263.9 lb

## 2020-02-13 DIAGNOSIS — I700 Atherosclerosis of aorta: Secondary | ICD-10-CM

## 2020-02-13 DIAGNOSIS — R400 Somnolence: Secondary | ICD-10-CM

## 2020-02-13 DIAGNOSIS — C3431 Malignant neoplasm of lower lobe, right bronchus or lung: Secondary | ICD-10-CM | POA: Diagnosis not present

## 2020-02-13 DIAGNOSIS — J411 Mucopurulent chronic bronchitis: Secondary | ICD-10-CM

## 2020-02-13 DIAGNOSIS — F17201 Nicotine dependence, unspecified, in remission: Secondary | ICD-10-CM

## 2020-02-13 DIAGNOSIS — J449 Chronic obstructive pulmonary disease, unspecified: Secondary | ICD-10-CM

## 2020-02-13 DIAGNOSIS — Z6841 Body Mass Index (BMI) 40.0 and over, adult: Secondary | ICD-10-CM

## 2020-02-13 DIAGNOSIS — M7989 Other specified soft tissue disorders: Secondary | ICD-10-CM | POA: Diagnosis not present

## 2020-02-13 MED ORDER — FUROSEMIDE 20 MG PO TABS: 20.0000 mg | ORAL_TABLET | Freq: Every day | ORAL | 3 refills | Status: DC

## 2020-02-13 NOTE — Patient Instructions (Signed)

## 2020-02-13 NOTE — Telephone Encounter (Signed)
She should contact [employer] over this

## 2020-02-13 NOTE — Progress Notes (Signed)
Established patient visit   Patient: Michelle Pitts   DOB: 11/18/1950   69 y.o. Female  MRN: [id number] Visit Date: 02/13/2020  Today's healthcare provider: Mar Daring, PA-C   Chief Complaint  Patient presents with  . Leg Swelling  . Sleeping Problem   Subjective    HPI  Leg Swelling:Patient with c/o bilateral swelling in lower extremities. Associated symptoms: SOB with activities, improves with rest. Denied any redness,drainage, warmth or pain.  Patient with hx of COPD.  She also c/o having episodes of falling asleep without trying to fall asleep. She will wake up after 10 minutes to as long as 2.5 hours. Reports this been happening but now this episodes are more frequently.Denied dizziness, lightheadedness or visual disturbances.  Patient Active Problem List   Diagnosis Date Noted  . S/P bilateral oophorectomy 06/27/2018  . Aortic atherosclerosis ([place]) 06/21/2018  . Malignant neoplasm of lung ([place]) 06/21/2018  . Abnormal EKG 06/21/2018  . Coronary artery calcification seen on CT scan 06/21/2018  . Pneumothorax on left 04/18/2018  . 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 Pneumothorax 04/13/2018  . Pneumothorax after biopsy 04/12/2018  . Lung nodule 03/19/2018  . Bilateral leg numbness 11/10/2017  . Obesity, Class III, BMI 40-49.9 (morbid obesity) ([place]) 11/10/2017  . DNR no code (do not resuscitate) 02/08/2016  . Personal history of tobacco use, presenting hazards to health 11/18/2015  . Shortness of breath 07/24/2015  . Malignant neoplasm of lower-inner quadrant of left female breast ([place]) 07/22/2015  . Tachycardia 07/22/2015  . Anxiety 05/15/2015  . Colon polyp 05/15/2015  . CAFL (chronic airflow limitation) ([place]) 05/15/2015  . Elevated blood sugar 05/15/2015  . Abnormal liver enzymes 05/15/2015  . Family history of diabetes mellitus 05/15/2015  . BP (high blood pressure) 05/15/2015  . Cannot sleep 05/15/2015  .  Apnea, sleep 05/15/2015  . Avitaminosis D 05/15/2015  . 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 Anxiety disorder 05/15/2015  . Chronic obstructive pulmonary disease ([place]) 05/15/2015  . Gastro-esophageal reflux disease without esophagitis 05/15/2015  . 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 Adult hypothyroidism 05/15/2015  . Pure hypercholesterolemia 05/15/2015  . Tobacco abuse, in remission 03/09/2015   Past Medical History:  Diagnosis Date  . Anxiety   . Breast cancer ([place]) 01/23/2014   Left breast, T1c, N0, M0. ER/PR +; Her 2 neu not overexpressed. Oncotype DX: 13,Low risk.9% over 10 years.   . Cancer ([place]) 1995   vulvar, [facility]  . COPD (chronic obstructive pulmonary disease) (H[facility])    bronchitis  . Dysrhythmia   . GERD (gastroesophageal reflux disease)   . History of colon polyps   . Hypercholesterolemia   . Hypertension   . Hypothyroidism   . Insomnia   . Lung cancer ([place])   . 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 Personal history of radiation therapy   . Personal history of tobacco use, presenting hazards to health 11/18/2015  . Sleep apnea   . Sleep apnea   . SOB (shortness of breath) on exertion   . Spinal stenosis   . Thyroid disease   . Vitamin D deficiency        Medications: Outpatient Medications Prior to Visit  Medication Sig  . gabapentin (NEURONTIN) 300 MG capsule Take by mouth.  Marland Kitchen ibuprofen (ADVIL) 600 MG tablet TAKE 1 TABLET(600 MG) BY MOUTH EVERY 8 HOURS AS NEEDED  . metoprolol succinate (TOPROL-XL) 25 MG 24 hr tablet Take  1 tablet (25 mg total) by mouth daily.  . Multiple Vitamins-Minerals (VITAMIN D3 COMPLETE PO) Take by mouth.  . [DISCONTINUED] erythromycin ophthalmic ointment Apply to affected eye once daily at bedtime x 5-7 days  . [DISCONTINUED] Fluocinolone Acetonide 0.01 % OIL Place 1 drop in ear(s) daily.   No facility-administered medications prior to visit.    Review of Systems  Constitutional: Positive for fatigue.  Respiratory:  Positive for shortness of breath. Negative for cough and chest tightness.   Cardiovascular: Positive for leg swelling. Negative for chest pain and palpitations.  Musculoskeletal: Positive for back pain.  Neurological: Positive for weakness.  Psychiatric/Behavioral: Positive for sleep disturbance.    Last CBC Lab Results  Component Value Date   WBC 6.2 02/13/2020   HGB 13.5 02/13/2020   HCT 39.3 02/13/2020   MCV 94 02/13/2020   MCH 32.4 02/13/2020   RDW 12.6 02/13/2020   PLT 216 58/04/9832   Last metabolic panel Lab Results  Component Value Date   GLUCOSE 109 (H) 02/13/2020   NA 143 02/13/2020   K 4.6 02/13/2020   CL 102 02/13/2020   CO2 24 02/13/2020   BUN 14 02/13/2020   CREATININE 0.91 02/13/2020   GFRNONAA 64 02/13/2020   GFRAA 74 02/13/2020   CALCIUM 10.0 02/13/2020   PROT 6.7 09/13/2019   ALBUMIN 4.3 09/13/2019   LABGLOB 2.4 09/13/2019   AGRATIO 1.8 09/13/2019   BILITOT 0.4 09/13/2019   ALKPHOS 140 (H) 09/13/2019   AST 30 09/13/2019   ALT 37 (H) 09/13/2019   ANIONGAP 9 10/11/2018      Objective    BP 128/85 (BP Location: Left Arm, Patient Position: Sitting, Cuff Size: Large)   Pulse 97   Temp (!) 97.4 F (36.3 C) (Temporal)   Resp 16   Wt 263 lb 14.4 oz (119.7 kg)   SpO2 92%   BMI 44.60 kg/m  BP Readings from Last 3 Encounters:  02/13/20 128/85  09/12/19 (!) 158/79  07/26/19 (!) 152/80   Wt Readings from Last 3 Encounters:  02/13/20 263 lb 14.4 oz (119.7 kg)  09/12/19 258 lb (117 kg)  07/26/19 257 lb (116.6 kg)      Physical Exam Vitals reviewed.  Constitutional:      General: She is not in acute distress.    Appearance: She is well-developed. She is not diaphoretic.  Neck:     Thyroid: No thyromegaly.     Vascular: No JVD.     Trachea: No tracheal deviation.  Cardiovascular:     Rate and Rhythm: Normal rate and regular rhythm.     Pulses: Normal pulses.     Heart sounds: Normal heart sounds. No murmur heard.  No friction rub. No  gallop.   Pulmonary:     Effort: Pulmonary effort is normal. No respiratory distress.     Breath sounds: Normal breath sounds. No wheezing or rales.  Musculoskeletal:     Cervical back: Normal range of motion and neck supple.     Right lower leg: Edema present.     Left lower leg: Edema present.  Lymphadenopathy:     Cervical: No cervical adenopathy.  Skin:    General: Skin is warm and dry.     Capillary Refill: Capillary refill takes less than 2 seconds.     Findings: No erythema or rash.  Neurological:     General: No focal deficit present.     Mental Status: Mental status is at baseline.  Psychiatric:  Mood and Affect: Mood normal.        Behavior: Behavior normal.        Thought Content: Thought content normal.        Judgment: Judgment normal.       Results for orders placed or performed in visit on 02/13/20  CBC w/Diff/Platelet  Result Value Ref Range   WBC 6.2 3.4 - 10.8 x10E3/uL   RBC 4.17 3.77 - 5.28 x10E6/uL   Hemoglobin 13.5 11.1 - 15.9 g/dL   Hematocrit 39.3 34.0 - 46.6 %   MCV 94 79 - 97 fL   MCH 32.4 26.6 - 33.0 pg   MCHC 34.4 31 - 35 g/dL   RDW 12.6 11.7 - 15.4 %   Platelets 216 150 - 450 x10E3/uL   Neutrophils 68 Not Estab. %   Lymphs 23 Not Estab. %   Monocytes 7 Not Estab. %   Eos 1 Not Estab. %   Basos 1 Not Estab. %   Neutrophils Absolute 4.2 1 - 7 x10E3/uL   Lymphocytes Absolute 1.4 0 - 3 x10E3/uL   Monocytes Absolute 0.4 0 - 0 x10E3/uL   EOS (ABSOLUTE) 0.1 0.0 - 0.4 x10E3/uL   Basophils Absolute 0.0 0 - 0 x10E3/uL   Immature Granulocytes 0 Not Estab. %   Immature Grans (Abs) 0.0 0.0 - 0.1 x10E3/uL  B Nat Peptide  Result Value Ref Range   BNP 16.4 0.0 - 100.0 pg/mL  Basic Metabolic Panel (BMET)  Result Value Ref Range   Glucose 109 (H) 65 - 99 mg/dL   BUN 14 8 - 27 mg/dL   Creatinine, Ser 0.91 0.57 - 1.00 mg/dL   GFR calc non Af Amer 64 >59 mL/min/1.73   GFR calc Af Amer 74 >59 mL/min/1.73   BUN/Creatinine Ratio 15 12 - 28    Sodium 143 134 - 144 mmol/L   Potassium 4.6 3.5 - 5.2 mmol/L   Chloride 102 96 - 106 mmol/L   CO2 24 20 - 29 mmol/L   Calcium 10.0 8.7 - 10.3 mg/dL    Assessment & Plan     1. Mucopurulent chronic bronchitis (HCC) EKG today shows sinus rhythm with a rate of 90. RSR noted in V1 and nonspecific T waves.  Patient has multiple risk factors for pulmonary strain possibly causing heart strain vs new heart failure. Advised to use compression stockings, elevate legs when at rest. Will give furosemide as below for leg swelling and edema. Will f/u in 2-4 weeks.  - CBC w/Diff/Platelet - B Nat Peptide - Basic Metabolic Panel (BMET)  2. CAFL (chronic airflow limitation) ([place]) See above medical treatment plan. - CBC w/Diff/Platelet - B Nat Peptide - Basic Metabolic Panel (BMET)  3. 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 - CBC w/Diff/Platelet - B Nat Peptide - Basic Metabolic Panel (BMET)  4. Leg swelling See above medical treatment plan. - CBC w/Diff/Platelet - B Nat Peptide - Basic Metabolic Panel (BMET) - EKG 12-Lead - furosemide (LASIX) 20 MG tablet; Take 1 tablet (20 mg total) by mouth daily.  Dispense: 30 tablet; Refill: 3  5. Aortic atherosclerosis (HCC) Not on statin. - CBC w/Diff/Platelet - B Nat Peptide - Basic Metabolic Panel (BMET) - EKG 12-Lead  6. Class 3 severe obesity due to excess calories with serious comorbidity and body mass index (BMI) of 40.0 to 44.9 in adult [facility]) Counseled patient on healthy lifestyle modifications including dieting and exercise.   7. Tobacco abuse,  in remission Still doing well not smoking. Encouraged and congratulated.  - CBC w/Diff/Platelet - B Nat Peptide - Basic Metabolic Panel (BMET)  8. Uncontrolled daytime somnolence See above medical treatment plan. Discussed sleep study. Patient declines repeat at this time. Reports she has had in the past and was found to have OSA but unable to  tolerate CPAP mask.  - EKG 12-Lead   Return if symptoms worsen or fail to improve.      Reynolds Bowl, PA-C, have reviewed all documentation for this visit. The documentation on 02/23/20 for the exam, diagnosis, procedures, and orders are all accurate and complete.   Rubye Beach  [facility] [phone] (phone) [phone] (fax)  [place]

## 2020-02-14 ENCOUNTER — Encounter: Payer: Self-pay | Admitting: Physician Assistant

## 2020-02-14 ENCOUNTER — Telehealth: Payer: Self-pay

## 2020-02-14 LAB — CBC WITH DIFFERENTIAL/PLATELET
Basophils Absolute: 0 10*3/uL (ref 0.0–0.2)
Basos: 1 %
EOS (ABSOLUTE): 0.1 10*3/uL (ref 0.0–0.4)
Eos: 1 %
Hematocrit: 39.3 % (ref 34.0–46.6)
Hemoglobin: 13.5 g/dL (ref 11.1–15.9)
Immature Grans (Abs): 0 10*3/uL (ref 0.0–0.1)
Immature Granulocytes: 0 %
Lymphocytes Absolute: 1.4 10*3/uL (ref 0.7–3.1)
Lymphs: 23 %
MCH: 32.4 pg (ref 26.6–33.0)
MCHC: 34.4 g/dL (ref 31.5–35.7)
MCV: 94 fL (ref 79–97)
Monocytes Absolute: 0.4 10*3/uL (ref 0.1–0.9)
Monocytes: 7 %
Neutrophils Absolute: 4.2 10*3/uL (ref 1.4–7.0)
Neutrophils: 68 %
Platelets: 216 10*3/uL (ref 150–450)
RBC: 4.17 x10E6/uL (ref 3.77–5.28)
RDW: 12.6 % (ref 11.7–15.4)
WBC: 6.2 10*3/uL (ref 3.4–10.8)

## 2020-02-14 LAB — BASIC METABOLIC PANEL
BUN/Creatinine Ratio: 15 (ref 12–28)
BUN: 14 mg/dL (ref 8–27)
CO2: 24 mmol/L (ref 20–29)
Calcium: 10 mg/dL (ref 8.7–10.3)
Chloride: 102 mmol/L (ref 96–106)
Creatinine, Ser: 0.91 mg/dL (ref 0.57–1.00)
GFR calc Af Amer: 74 mL/min/{1.73_m2} (ref >59)
GFR calc non Af Amer: 64 mL/min/{1.73_m2} (ref >59)
Glucose: 109 mg/dL — ABNORMAL HIGH (ref 65–99)
Potassium: 4.6 mmol/L (ref 3.5–5.2)
Sodium: 143 mmol/L (ref 134–144)

## 2020-02-14 LAB — BRAIN NATRIURETIC PEPTIDE: BNP: 16.4 pg/mL (ref 0.0–100.0)

## 2020-02-14 NOTE — Telephone Encounter (Signed)
-----   Message from Mar Daring, [place] sent at 02/14/2020  3:54 PM EDT ----- BNP is normal. Most likely not heart failure. May want to call cancer center and see about moving Chest CT due to symptoms of the increased shortness of breath

## 2020-02-14 NOTE — Telephone Encounter (Signed)
-----   Message from Mar Daring, [place] sent at 02/14/2020  8:19 AM EDT ----- Blood count and kidney function is normal. BNP (cardiac enzyme) is still pending at this time. Will result once I get it.

## 2020-02-14 NOTE — Telephone Encounter (Signed)
Written by Mar Daring, PA-C on 02/14/2020 3:54 PM EDT View Past Comments Seen by patient Michelle Pitts on 02/14/2020 4:17 PM

## 2020-02-14 NOTE — Telephone Encounter (Signed)
BNP is normal. Most likely not heart failure. May want to call cancer center and see about moving Chest CT due to symptoms of the increased shortness of breath  Written by Mar Daring, PA-C on 02/14/2020 3:54 PM EDT View Past Comments Seen by patient Michelle Pitts on 02/14/2020 4:17 PM

## 2020-02-20 NOTE — Progress Notes (Signed)
Subjective:   Michelle Pitts is a 69 y.o. female who presents for Medicare Annual (Subsequent) preventive examination.  Review of Systems    N/A  Cardiac Risk Factors include: advanced age (>60men, >53 women);dyslipidemia;hypertension;obesity (BMI >30kg/m2);sedentary lifestyle     Objective:    Today's Vitals   03/02/20 1330  BP: (!) 136/59  Pulse: 87  Temp: (!) 97.3 F (36.3 C)  TempSrc: Temporal  SpO2: 96%  Weight: 266 lb 3.2 oz (120.7 kg)  Height: 5\' 5"  (1.651 m)  PainSc: 0-No pain   Body mass index is 44.3 kg/m.  Advanced Directives 03/02/2020 04/24/2019 02/19/2019 10/18/2018 07/19/2018 04/26/2018 04/18/2018  Does Patient Have a Medical Advance Directive? Yes No No No No No No  Type of Paramedic of [place];Living will - - - - - -  Copy of [place] in Chart? No - copy requested - - - - - -  Would patient like information on creating a medical advance directive? - No - Patient declined Yes (MAU/Ambulatory/Procedural Areas - Information given) No - Patient declined No - Patient declined No - Patient declined No - Patient declined    Current Medications (verified) Outpatient Encounter Medications as of 03/02/2020  Medication Sig  . gabapentin (NEURONTIN) 300 MG capsule Take by mouth.  Marland Kitchen ibuprofen (ADVIL) 600 MG tablet TAKE 1 TABLET(600 MG) BY MOUTH EVERY 8 HOURS AS NEEDED  . metoprolol succinate (TOPROL-XL) 25 MG 24 hr tablet Take 1 tablet (25 mg total) by mouth daily.  . Multiple Vitamins-Minerals (VITAMIN D3 COMPLETE PO) Take by mouth daily. Dose unknown  . furosemide (LASIX) 20 MG tablet Take 1 tablet (20 mg total) by mouth daily. (Patient not taking: Reported on 03/02/2020)   No facility-administered encounter medications on file as of 03/02/2020.    Allergies (verified) Codeine, Gramineae pollens, Hydrochlorothiazide, and Levaquin [levofloxacin in d5w]   History: Past Medical History:  Diagnosis Date  . Anxiety   .  Breast cancer ([place]) 01/23/2014   Left breast, T1c, N0, M0. ER/PR +; Her 2 neu not overexpressed. Oncotype DX: 13,Low risk.9% over 10 years.   . Cancer ([place]) 1995   vulvar, [facility]  . COPD (chronic obstructive pulmonary disease) ([facility])    bronchitis  . Dysrhythmia   . GERD (gastroesophageal reflux disease)   . History of colon polyps   . Hypercholesterolemia   . Hypertension   . Hypothyroidism   . Insomnia   . Lung cancer ([place])   . 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 Personal history of radiation therapy   . Personal history of tobacco use, presenting hazards to health 11/18/2015  . Sleep apnea   . Sleep apnea   . SOB (shortness of breath) on exertion   . Spinal stenosis   . Thyroid disease   . Vitamin D deficiency    Past Surgical History:  Procedure Laterality Date  . BLADDER SUSPENSION  2001  . BREAST BIOPSY Right   . BREAST BIOPSY Left   . BREAST CYST ASPIRATION Left 1985  . BREAST EXCISIONAL BIOPSY Right 1975   x 2  . BREAST SURGERY Left 01/23/2014   T1c, N0; Er/ PR positive, Her 2 negative   2 years anti-estrogen RX. Oncotype DX: 13,Low risk.9% over 10 years  . BREAST SURGERY Left 04/10/2014   Debridement of fat necrosis, left breast cancer wide excision site.   . CHOLECYSTECTOMY    . COLONOSCOPY  2014   Dr Tiffany Kocher  . COLONOSCOPY WITH PROPOFOL N/A  10/10/2016   Procedure: COLONOSCOPY WITH PROPOFOL;  Surgeon: Manya Silvas, MD;  Location: [facility]COPY;  Service: Endoscopy;  Laterality: N/A;  . ELECTROMAGNETIC NAVIGATION BROCHOSCOPY N/A 04/12/2018   Procedure: ELECTROMAGNETIC NAVIGATION BRONCHOSCOPY;  Surgeon: Flora Lipps, MD;  Location: [facility] ORS;  Service: Cardiopulmonary;  Laterality: N/A;  . LAPAROSCOPIC BILATERAL SALPINGO OOPHERECTOMY Bilateral 06/27/2018   Procedure: LAPAROSCOPIC BILATERAL SALPINGO OOPHORECTOMY;  Surgeon: Homero Fellers, MD;  Location: [facility] ORS;  Service: Gynecology;  Laterality: Bilateral;  . ROTATOR CUFF REPAIR Right 1995  . VAGINAL  HYSTERECTOMY  1978   partial  . VULVECTOMY PARTIAL  1995  . wide excision debriedment Left 04/10/14   Family History  Problem Relation Age of Onset  . Diabetes Father   . COPD Mother   . Heart failure Sister   . Emphysema Sister   . Diabetes Sister   . Hypertension Sister   . Emphysema Brother   . Lung cancer Brother   . Crohn's disease Sister   . Hypertension Sister   . Hypertension Sister   . Emphysema Brother   . Cancer Other        great great paternal aunt, ? age  . Cancer Maternal Grandfather 58       ? source  . Cancer Paternal Grandfather 55       ? source  . Breast cancer Neg Hx    Social History   Socioeconomic History  . Marital status: Divorced    Spouse name: Not on file  . Number of children: 2  . Years of education: Not on file  . Highest education level: Some college, no degree  Occupational History  . Occupation: retired  Tobacco Use  . Smoking status: Former Smoker    Packs/day: 1.00    Years: 50.00    Pack years: 50.00    Types: Cigarettes    Quit date: 09/15/2014    Years since quitting: 5.4  . Smokeless tobacco: Never Used  Vaping Use  . Vaping Use: Never used  Substance and Sexual Activity  . Alcohol use: Yes    Alcohol/week: 14.0 standard drinks    Types: 14 Cans of beer per week    Comment: occasionally wine  . Drug use: No  . Sexual activity: Not Currently    Birth control/protection: Surgical  Other Topics Concern  . Not on file  Social History Narrative  . Not on file   Social Determinants of Health   Financial Resource Strain: Low Risk   . Difficulty of Paying Living Expenses: Not hard at all  Food Insecurity: No Food Insecurity  . Worried About Charity fundraiser in the Last Year: Never true  . Ran Out of Food in the Last Year: Never true  Transportation Needs: No Transportation Needs  . Lack of Transportation (Medical): No  . Lack of Transportation (Non-Medical): No  Physical Activity: Inactive  . Days of Exercise per  Week: 0 days  . Minutes of Exercise per Session: 0 min  Stress: No Stress Concern Present  . Feeling of Stress : Not at all  Social Connections: Socially Isolated  . Frequency of Communication with Friends and Family: Twice a week  . Frequency of Social Gatherings with Friends and Family: More than three times a week  . Attends Religious Services: Never  . Active Member of Clubs or Organizations: No  . Attends Archivist Meetings: Never  . Marital Status: Divorced    Tobacco Counseling Counseling given: Not Answered  Clinical Intake:  Pre-visit preparation completed: Yes  Pain : No/denies pain Pain Score: 0-No pain     Nutritional Status: BMI > 30  Obese Nutritional Risks: None Diabetes: No  How often do you need to have someone help you when you read instructions, pamphlets, or other written materials from your doctor or pharmacy?: 1 - Never  Diabetic? No  Interpreter Needed?: No  Information entered by :: [facility], LPN   Activities of Daily Living In your present state of health, do you have any difficulty performing the following activities: 03/02/2020  Hearing? Y  Comment Has hearing loss in right ear. Does not wear hearing aids.  Vision? N  Difficulty concentrating or making decisions? N  Walking or climbing stairs? N  Dressing or bathing? N  Doing errands, shopping? N  Preparing Food and eating ? N  Using the Toilet? N  In the past six months, have you accidently leaked urine? Y  Comment Had a previous bladder tact.  Do you have problems with loss of bowel control? N  Managing your Medications? N  Managing your Finances? N  Housekeeping or managing your Housekeeping? N  Some recent data might be hidden    Patient Care Team: Mar Daring, PA-C as PCP - General (Family Medicine) Bary Castilla, Forest Gleason, MD (General Surgery) Noreene Filbert, MD as Referring Physician (Radiation Oncology) Pa, [place] (Optometry)  Indicate any  recent Medical Services you may have received from other than [facility] providers in the past year (date may be approximate).     Assessment:   This is a routine wellness examination for Michelle Pitts.  Hearing/Vision screen No exam data present  Dietary issues and exercise activities discussed: Current Exercise Habits: The patient does not participate in regular exercise at present, Exercise limited by: None identified  Goals    . Have 3 meals a day     Recommend eating 3 small meals a day with 2 healthy snacks in between.     . Reduce alcohol intake     Recommend to cut back on alcohol intake to no more than 1 drink a day.       Depression Screen PHQ 2/9 Scores 03/02/2020 02/19/2019 02/14/2018 06/02/2017 02/02/2017 02/08/2016 02/11/2015  PHQ - 2 Score 0 0 0 0 0 4 0  PHQ- 9 Score - - - - - 10 -    Fall Risk Fall Risk  03/02/2020 02/19/2019 02/12/2019 02/14/2018 06/02/2017  Falls in the past year? 0 0 0 No No  Number falls in past yr: 0 - 0 - -  Injury with Fall? 0 - - - -    Any stairs in or around the home? Yes  If so, are there any without handrails? No  Home free of loose throw rugs in walkways, pet beds, electrical cords, etc? Yes  Adequate lighting in your home to reduce risk of falls? Yes   ASSISTIVE DEVICES UTILIZED TO PREVENT FALLS:  Life alert? No  Use of a cane, walker or w/c? No  Grab bars in the bathroom? No  Shower chair or bench in shower? No  Elevated toilet seat or a handicapped toilet? No   TIMED UP AND GO:  Was the test performed? Yes .  Length of time to ambulate 10 feet: 10 sec.   Gait steady and fast without use of assistive device  Cognitive Function:     6CIT Screen 03/02/2020 02/19/2019 02/02/2017  What Year? 0 points 0 points 0 points  What month?  0 points 0 points 0 points  What time? 0 points 0 points 0 points  Count back from 20 0 points 0 points 0 points  Months in reverse 0 points 0 points 0 points  Repeat phrase 0 points 0 points 2 points  Total Score  0 0 2    Immunizations Immunization History  Administered Date(s) Administered  . Fluad Quad(high Dose 65+) 05/14/2019  . Influenza, High Dose Seasonal PF 06/25/2015, 06/02/2016, 05/17/2017, 05/01/2018  . PFIZER SARS-COV-2 Vaccination 10/01/2019, 10/22/2019  . Pneumococcal Conjugate-13 05/16/2014  . Pneumococcal Polysaccharide-23 06/16/2007, 02/08/2016  . Zoster 09/17/2007  . Zoster Recombinat (Shingrix) 10/12/2018, 12/12/2018    TDAP status: Due, Education has been provided regarding the importance of this vaccine. Advised may receive this vaccine at local pharmacy or Health Dept. Aware to provide a copy of the vaccination record if obtained from local pharmacy or Health Dept. Verbalized acceptance and understanding. Flu Vaccine status: Up to date Pneumococcal vaccine status: Up to date Covid-19 vaccine status: Completed vaccines  Qualifies for Shingles Vaccine? Yes   Zostavax completed Yes   Shingrix Completed?: Yes  Screening Tests Health Maintenance  Topic Date Due  . TETANUS/TDAP  08/15/2026 (Originally 05/14/1970)  . INFLUENZA VACCINE  03/15/2020  . MAMMOGRAM  02/09/2022  . COLONOSCOPY  10/10/2026  . DEXA SCAN  Completed  . COVID-19 Vaccine  Completed  . Hepatitis C Screening  Completed  . PNA vac Low Risk Adult  Completed    Health Maintenance  There are no preventive care reminders to display for this patient.  Colorectal cancer screening: Completed 10/10/16. Repeat every 10 years Mammogram status: Completed 02/10/20. Repeat every year Bone Density status: Completed 05/13/15. Previous DEXA scan was normal. No repeat needed unless advised by a physician.  Lung Cancer Screening: (Low Dose CT Chest recommended if Age 92-80 years, 30 pack-year currently smoking OR have quit w/in 15years.) does qualify, however is up to date. Next scan is scheduled for 04/2020.  Additional Screening:  Hepatitis C Screening: Up to date  Vision Screening: Recommended annual  ophthalmology exams for early detection of glaucoma and other disorders of the eye. Is the patient up to date with their annual eye exam?  Yes  Who is the provider or what is the name of the office in which the patient attends annual eye exams? [place] If pt is not established with a provider, would they like to be referred to a provider to establish care? No .   Dental Screening: Recommended annual dental exams for proper oral hygiene  Community Resource Referral / Chronic Care Management: CRR required this visit?  No   CCM required this visit?  No      Plan:     I have personally reviewed and noted the following in the patient's chart:   . Medical and social history . Use of alcohol, tobacco or illicit drugs  . Current medications and supplements . Functional ability and status . Nutritional status . Physical activity . Advanced directives . List of other physicians . Hospitalizations, surgeries, and ER visits in previous 12 months . Vitals . Screenings to include cognitive, depression, and falls . Referrals and appointments  In addition, I have reviewed and discussed with patient certain preventive protocols, quality metrics, and best practice recommendations. A written personalized care plan for preventive services as well as general preventive health recommendations were provided to patient.     Michelle Pitts, [place]   [id number]   Nurse Notes: None.

## 2020-02-21 ENCOUNTER — Ambulatory Visit: Payer: Medicare PPO | Admitting: Physician Assistant

## 2020-03-02 ENCOUNTER — Other Ambulatory Visit: Payer: Self-pay

## 2020-03-02 ENCOUNTER — Ambulatory Visit (INDEPENDENT_AMBULATORY_CARE_PROVIDER_SITE_OTHER): Payer: Medicare PPO | Admitting: Physician Assistant

## 2020-03-02 ENCOUNTER — Encounter: Payer: Self-pay | Admitting: Physician Assistant

## 2020-03-02 ENCOUNTER — Ambulatory Visit (INDEPENDENT_AMBULATORY_CARE_PROVIDER_SITE_OTHER): Payer: Medicare PPO

## 2020-03-02 VITALS — BP 136/59 | HR 87 | Temp 97.3°F | Ht 65.0 in | Wt 266.2 lb

## 2020-03-02 DIAGNOSIS — R748 Abnormal levels of other serum enzymes: Secondary | ICD-10-CM | POA: Diagnosis not present

## 2020-03-02 DIAGNOSIS — Z0000 Encounter for general adult medical examination without abnormal findings: Secondary | ICD-10-CM

## 2020-03-02 DIAGNOSIS — R200 Anesthesia of skin: Secondary | ICD-10-CM

## 2020-03-02 DIAGNOSIS — Z6841 Body Mass Index (BMI) 40.0 and over, adult: Secondary | ICD-10-CM

## 2020-03-02 MED ORDER — GABAPENTIN 300 MG PO CAPS: 300.0000 mg | ORAL_CAPSULE | Freq: Every day | ORAL | 1 refills | Status: DC

## 2020-03-02 NOTE — Patient Instructions (Addendum)
Michelle Pitts , Thank you for taking time to come for your Medicare Wellness Visit. I appreciate your ongoing commitment to your health goals. Please review the following plan we discussed and let me know if I can assist you in the future.   Screening recommendations/referrals: Colonoscopy: Up to date, due 09/2026 Mammogram: Up to date, due 01/2021 Bone Density: Previous DEXA scan was normal. No repeat needed unless advised by a physician. Recommended yearly ophthalmology/optometry visit for glaucoma screening and checkup Recommended yearly dental visit for hygiene and checkup  Vaccinations: Influenza vaccine: Done 05/14/19 Pneumococcal vaccine: Completed series Tdap vaccine: Currently due, declined today.  Shingles vaccine: Completed series    Advanced directives: Please bring a copy of your POA (Power of Attorney) and/or Living Will to your next appointment once completed.   Conditions/risks identified: Recommend to cut back on alcohol intake to no more than 1 drink a day. Continue to eat 3 small meals a day with two healthy snacks in between.   Next appointment: 2:00 PM today with Fenton Malling. Declined scheduling an AWV for 2022 at this time.    Preventive Care 20 Years and Older, Female Preventive care refers to lifestyle choices and visits with your health care provider that can promote health and wellness. What does preventive care include?  A yearly physical exam. This is also called an annual well check.  Dental exams once or twice a year.  Routine eye exams. Ask your health care provider how often you should have your eyes checked.  Personal lifestyle choices, including:  Daily care of your teeth and gums.  Regular physical activity.  Eating a healthy diet.  Avoiding tobacco and drug use.  Limiting alcohol use.  Practicing safe sex.  Taking low-dose aspirin every day.  Taking vitamin and mineral supplements as recommended by your health care provider. What  happens during an annual well check? The services and screenings done by your health care provider during your annual well check will depend on your age, overall health, lifestyle risk factors, and family history of disease. Counseling  Your health care provider may ask you questions about your:  Alcohol use.  Tobacco use.  Drug use.  Emotional well-being.  Home and relationship well-being.  Sexual activity.  Eating habits.  History of falls.  Memory and ability to understand (cognition).  Work and work Statistician.  Reproductive health. Screening  You may have the following tests or measurements:  Height, weight, and BMI.  Blood pressure.  Lipid and cholesterol levels. These may be checked every 5 years, or more frequently if you are over 84 years old.  Skin check.  Lung cancer screening. You may have this screening every year starting at age 80 if you have a 30-pack-year history of smoking and currently smoke or have quit within the past 15 years.  Fecal occult blood test (FOBT) of the stool. You may have this test every year starting at age 68.  Flexible sigmoidoscopy or colonoscopy. You may have a sigmoidoscopy every 5 years or a colonoscopy every 10 years starting at age 27.  Hepatitis C blood test.  Hepatitis B blood test.  Sexually transmitted disease (STD) testing.  Diabetes screening. This is done by checking your blood sugar (glucose) after you have not eaten for a while (fasting). You may have this done every 1-3 years.  Bone density scan. This is done to screen for osteoporosis. You may have this done starting at age 73.  Mammogram. This may be done every 1-2  years. Talk to your health care provider about how often you should have regular mammograms. Talk with your health care provider about your test results, treatment options, and if necessary, the need for more tests. Vaccines  Your health care provider may recommend certain vaccines, such  as:  Influenza vaccine. This is recommended every year.  Tetanus, diphtheria, and acellular pertussis (Tdap, Td) vaccine. You may need a Td booster every 10 years.  Zoster vaccine. You may need this after age 32.  Pneumococcal 13-valent conjugate (PCV13) vaccine. One dose is recommended after age 33.  Pneumococcal polysaccharide (PPSV23) vaccine. One dose is recommended after age 9. Talk to your health care provider about which screenings and vaccines you need and how often you need them. This information is not intended to replace advice given to you by your health care provider. Make sure you discuss any questions you have with your health care provider. Document Released: 08/28/2015 Document Revised: 04/20/2016 Document Reviewed: 06/02/2015 Elsevier Interactive Patient Education  2017 [employer] Prevention in the Home Falls can cause injuries. They can happen to people of all ages. There are many things you can do to make your home safe and to help prevent falls. What can I do on the outside of my home?  Regularly fix the edges of walkways and driveways and fix any cracks.  Remove anything that might make you trip as you walk through a door, such as a raised step or threshold.  Trim any bushes or trees on the path to your home.  Use bright outdoor lighting.  Clear any walking paths of anything that might make someone trip, such as rocks or tools.  Regularly check to see if handrails are loose or broken. Make sure that both sides of any steps have handrails.  Any raised decks and porches should have guardrails on the edges.  Have any leaves, snow, or ice cleared regularly.  Use sand or salt on walking paths during winter.  Clean up any spills in your garage right away. This includes oil or grease spills. What can I do in the bathroom?  Use night lights.  Install grab bars by the toilet and in the tub and shower. Do not use towel bars as grab bars.  Use  non-skid mats or decals in the tub or shower.  If you need to sit down in the shower, use a plastic, non-slip stool.  Keep the floor dry. Clean up any water that spills on the floor as soon as it happens.  Remove soap buildup in the tub or shower regularly.  Attach bath mats securely with double-sided non-slip rug tape.  Do not have throw rugs and other things on the floor that can make you trip. What can I do in the bedroom?  Use night lights.  Make sure that you have a light by your bed that is easy to reach.  Do not use any sheets or blankets that are too big for your bed. They should not hang down onto the floor.  Have a firm chair that has side arms. You can use this for support while you get dressed.  Do not have throw rugs and other things on the floor that can make you trip. What can I do in the kitchen?  Clean up any spills right away.  Avoid walking on wet floors.  Keep items that you use a lot in easy-to-reach places.  If you need to reach something above you, use a strong step  stool that has a grab bar.  Keep electrical cords out of the way.  Do not use floor polish or wax that makes floors slippery. If you must use wax, use non-skid floor wax.  Do not have throw rugs and other things on the floor that can make you trip. What can I do with my stairs?  Do not leave any items on the stairs.  Make sure that there are handrails on both sides of the stairs and use them. Fix handrails that are broken or loose. Make sure that handrails are as long as the stairways.  Check any carpeting to make sure that it is firmly attached to the stairs. Fix any carpet that is loose or worn.  Avoid having throw rugs at the top or bottom of the stairs. If you do have throw rugs, attach them to the floor with carpet tape.  Make sure that you have a light switch at the top of the stairs and the bottom of the stairs. If you do not have them, ask someone to add them for you. What  else can I do to help prevent falls?  Wear shoes that:  Do not have high heels.  Have rubber bottoms.  Are comfortable and fit you well.  Are closed at the toe. Do not wear sandals.  If you use a stepladder:  Make sure that it is fully opened. Do not climb a closed stepladder.  Make sure that both sides of the stepladder are locked into place.  Ask someone to hold it for you, if possible.  Clearly mark and make sure that you can see:  Any grab bars or handrails.  First and last steps.  Where the edge of each step is.  Use tools that help you move around (mobility aids) if they are needed. These include:  Canes.  Walkers.  Scooters.  Crutches.  Turn on the lights when you go into a dark area. Replace any light bulbs as soon as they burn out.  Set up your furniture so you have a clear path. Avoid moving your furniture around.  If any of your floors are uneven, fix them.  If there are any pets around you, be aware of where they are.  Review your medicines with your doctor. Some medicines can make you feel dizzy. This can increase your chance of falling. Ask your doctor what other things that you can do to help prevent falls. This information is not intended to replace advice given to you by your health care provider. Make sure you discuss any questions you have with your health care provider. Document Released: 05/28/2009 Document Revised: 01/07/2016 Document Reviewed: 09/05/2014 Elsevier Interactive Patient Education  2017 [employer].

## 2020-03-02 NOTE — Progress Notes (Signed)
Complete physical exam   Patient: Michelle Pitts   DOB: [birth year]   69 y.o. Female  MRN: [id number] Visit Date: 03/02/2020  Today's healthcare provider: Mar Daring, PA-C   Chief Complaint  Patient presents with  . Annual Exam   Subjective    Michelle Pitts is a 69 y.o. female who presents today for a complete physical exam.  She reports consuming a general diet. The patient does not participate in regular exercise at present. She generally feels well. She reports sleeping well. She does not have additional problems to discuss today.  HPI  Had AWV with NHA today.   Past Medical History:  Diagnosis Date  . Anxiety   . Breast cancer ([place]) 01/23/2014   Left breast, T1c, N0, M0. ER/PR +; Her 2 neu not overexpressed. Oncotype DX: 13,Low risk.9% over 10 years.   . Cancer ([place]) 1995   vulvar, [facility]  . COPD (chronic obstructive pulmonary disease) ([facility])    bronchitis  . Dysrhythmia   . GERD (gastroesophageal reflux disease)   . History of colon polyps   . Hypercholesterolemia   . Hypertension   . Hypothyroidism   . Insomnia   . Lung cancer ([place])   . 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 Personal history of radiation therapy   . Personal history of tobacco use, presenting hazards to health 11/18/2015  . Sleep apnea   . Sleep apnea   . SOB (shortness of breath) on exertion   . Spinal stenosis   . Thyroid disease   . Vitamin D deficiency    Past Surgical History:  Procedure Laterality Date  . BLADDER SUSPENSION  2001  . BREAST BIOPSY Right   . BREAST BIOPSY Left   . BREAST CYST ASPIRATION Left 1985  . BREAST EXCISIONAL BIOPSY Right 1975   x 2  . BREAST SURGERY Left 01/23/2014   T1c, N0; Er/ PR positive, Her 2 negative   2 years anti-estrogen RX. Oncotype DX: 13,Low risk.9% over 10 years  . BREAST SURGERY Left 04/10/2014   Debridement of fat necrosis, left breast cancer wide excision site.   . CHOLECYSTECTOMY    . COLONOSCOPY  2014   Dr Tiffany Kocher  .  COLONOSCOPY WITH PROPOFOL N/A 10/10/2016   Procedure: COLONOSCOPY WITH PROPOFOL;  Surgeon: Manya Silvas, MD;  Location: [facility] ENDOSCOPY;  Service: Endoscopy;  Laterality: N/A;  . ELECTROMAGNETIC NAVIGATION BROCHOSCOPY N/A 04/12/2018   Procedure: ELECTROMAGNETIC NAVIGATION BRONCHOSCOPY;  Surgeon: Flora Lipps, MD;  Location: [facility] ORS;  Service: Cardiopulmonary;  Laterality: N/A;  . LAPAROSCOPIC BILATERAL SALPINGO OOPHERECTOMY Bilateral 06/27/2018   Procedure: LAPAROSCOPIC BILATERAL SALPINGO OOPHORECTOMY;  Surgeon: Homero Fellers, MD;  Location: [facility] ORS;  Service: Gynecology;  Laterality: Bilateral;  . ROTATOR CUFF REPAIR Right 1995  . VAGINAL HYSTERECTOMY  1978   partial  . VULVECTOMY PARTIAL  1995  . wide excision debriedment Left 04/10/14   Social History   Socioeconomic History  . Marital status: Divorced    Spouse name: Not on file  . Number of children: 2  . Years of education: Not on file  . Highest education level: Some college, no degree  Occupational History  . Occupation: retired  Tobacco Use  . Smoking status: Former Smoker    Packs/day: 1.00    Years: 50.00    Pack years: 50.00    Types: Cigarettes    Quit date: 09/15/2014    Years since quitting: 5.4  . Smokeless tobacco: Never Used  Vaping Use  . Vaping Use: Never used  Substance and Sexual Activity  . Alcohol use: Yes    Alcohol/week: 14.0 standard drinks    Types: 14 Cans of beer per week    Comment: occasionally wine  . Drug use: No  . Sexual activity: Not Currently    Birth control/protection: Surgical  Other Topics Concern  . Not on file  Social History Narrative  . Not on file   Social Determinants of Health   Financial Resource Strain: Low Risk   . Difficulty of Paying Living Expenses: Not hard at all  Food Insecurity: No Food Insecurity  . Worried About Charity fundraiser in the Last Year: Never true  . Ran Out of Food in the Last Year: Never true  Transportation Needs: No  Transportation Needs  . Lack of Transportation (Medical): No  . Lack of Transportation (Non-Medical): No  Physical Activity: Inactive  . Days of Exercise per Week: 0 days  . Minutes of Exercise per Session: 0 min  Stress: No Stress Concern Present  . Feeling of Stress : Not at all  Social Connections: Socially Isolated  . Frequency of Communication with Friends and Family: Twice a week  . Frequency of Social Gatherings with Friends and Family: More than three times a week  . Attends Religious Services: Never  . Active Member of Clubs or Organizations: No  . Attends Archivist Meetings: Never  . Marital Status: Divorced  Human resources officer Violence: Not At Risk  . Fear of Current or Ex-Partner: No  . Emotionally Abused: No  . Physically Abused: No  . Sexually Abused: No   Family Status  Relation Name Status  . Father  Deceased  . Mother  Deceased  . Sister 1 Alive  . Brother  Deceased at age 64  . Sister 2 Alive  . Sister 3 Alive  . Brother  Deceased  . Other  (Not Specified)  . MGF  (Not Specified)  . PGF  (Not Specified)  . Neg Hx  (Not Specified)   Family History  Problem Relation Age of Onset  . Diabetes Father   . COPD Mother   . Heart failure Sister   . Emphysema Sister   . Diabetes Sister   . Hypertension Sister   . Emphysema Brother   . Lung cancer Brother   . Crohn's disease Sister   . Hypertension Sister   . Hypertension Sister   . Emphysema Brother   . Cancer Other        great great paternal aunt, ? age  . Cancer Maternal Grandfather 51       ? source  . Cancer Paternal Grandfather 36       ? source  . Breast cancer Neg Hx    Allergies  Allergen Reactions  . Codeine Nausea And Vomiting  . Gramineae Pollens     sinus  . Hydrochlorothiazide Swelling and Other (See Comments)    Swelling of tongue.  [place]ack [place] [Levofloxacin In D5w] Hives, Swelling and Other (See Comments)    Leg swelling     Patient Care Team: Mar Daring,  PA-C as PCP - General (Family Medicine) Bary Castilla, Forest Gleason, MD (General Surgery) Noreene Filbert, MD as Referring Physician (Radiation Oncology) Pa, Henry (Optometry)   Medications: Outpatient Medications Prior to Visit  Medication Sig  . furosemide (LASIX) 20 MG tablet Take 1 tablet (20 mg total) by mouth daily. (Patient not taking: Reported on 03/02/2020)  .  gabapentin (NEURONTIN) 300 MG capsule Take by mouth.  Marland Kitchen ibuprofen (ADVIL) 600 MG tablet TAKE 1 TABLET(600 MG) BY MOUTH EVERY 8 HOURS AS NEEDED  . metoprolol succinate (TOPROL-XL) 25 MG 24 hr tablet Take 1 tablet (25 mg total) by mouth daily.  . Multiple Vitamins-Minerals (VITAMIN D3 COMPLETE PO) Take by mouth daily. Dose unknown   No facility-administered medications prior to visit.    Review of Systems  Constitutional: Negative.   HENT: Positive for dental problem.   Eyes: Negative.   Respiratory: Negative.   Cardiovascular: Positive for leg swelling.  Gastrointestinal: Negative.   Endocrine: Positive for heat intolerance.  Genitourinary: Positive for enuresis.  Musculoskeletal: Positive for back pain.  Allergic/Immunologic: Positive for environmental allergies.  Neurological: Negative.   Hematological: Negative.   Psychiatric/Behavioral: Negative.     Last CBC Lab Results  Component Value Date   WBC 6.2 02/13/2020   HGB 13.5 02/13/2020   HCT 39.3 02/13/2020   MCV 94 02/13/2020   MCH 32.4 02/13/2020   RDW 12.6 02/13/2020   PLT 216 62/37/6283   Last metabolic panel Lab Results  Component Value Date   GLUCOSE 109 (H) 02/13/2020   NA 143 02/13/2020   K 4.6 02/13/2020   CL 102 02/13/2020   CO2 24 02/13/2020   BUN 14 02/13/2020   CREATININE 0.91 02/13/2020   GFRNONAA 64 02/13/2020   GFRAA 74 02/13/2020   CALCIUM 10.0 02/13/2020   PROT 6.9 03/02/2020   ALBUMIN 4.6 03/02/2020   LABGLOB 2.4 09/13/2019   AGRATIO 1.8 09/13/2019   BILITOT 0.5 03/02/2020   ALKPHOS 141 (H) 03/02/2020   AST 40  03/02/2020   ALT 54 (H) 03/02/2020   ANIONGAP 9 10/11/2018      Objective     BP 136/59Important (BP Location: Right Arm)  Pulse 87  Temp 97.3 F (36.3 C)Important (Temporal)  Ht 5\' 5"  (1.651 m)  Wt 266 lb 3.2 oz (120.7 kg)  SpO2 96%  BMI 44.30 kg/m  BSA 2.35 m  BP Readings from Last 3 Encounters:  03/02/20 (!) 136/59  02/13/20 128/85  09/12/19 (!) 158/79   Wt Readings from Last 3 Encounters:  03/02/20 266 lb 3.2 oz (120.7 kg)  02/13/20 263 lb 14.4 oz (119.7 kg)  09/12/19 258 lb (117 kg)      Physical Exam Vitals reviewed.  Constitutional:      General: She is not in acute distress.    Appearance: Normal appearance. She is well-developed. She is obese. She is not ill-appearing or diaphoretic.  HENT:     Head: Normocephalic and atraumatic.     Right Ear: Tympanic membrane, ear canal and external ear normal.     Left Ear: Tympanic membrane, ear canal and external ear normal.     Nose: Nose normal.     Mouth/Throat:     Mouth: Mucous membranes are moist.     Pharynx: Oropharynx is clear. No oropharyngeal exudate.  Eyes:     General: No scleral icterus.       Right eye: No discharge.        Left eye: No discharge.     Extraocular Movements: Extraocular movements intact.     Conjunctiva/sclera: Conjunctivae normal.     Pupils: Pupils are equal, round, and reactive to light.  Neck:     Thyroid: No thyromegaly.     Vascular: No carotid bruit or JVD.     Trachea: No tracheal deviation.  Cardiovascular:     Rate and Rhythm: Normal  rate and regular rhythm.     Pulses: Normal pulses.     Heart sounds: Normal heart sounds. No murmur heard.  No friction rub. No gallop.   Pulmonary:     Effort: Pulmonary effort is normal. No respiratory distress.     Breath sounds: Normal breath sounds. No wheezing or rales.  Chest:     Chest wall: No tenderness.  Abdominal:     General: Bowel sounds are normal. There is no distension.     Palpations: Abdomen is soft. There  is no mass.     Tenderness: There is no abdominal tenderness. There is no guarding or rebound.  Musculoskeletal:        General: No tenderness. Normal range of motion.     Cervical back: Normal range of motion and neck supple.  Lymphadenopathy:     Cervical: No cervical adenopathy.  Skin:    General: Skin is warm and dry.     Capillary Refill: Capillary refill takes less than 2 seconds.     Findings: No rash.  Neurological:     General: No focal deficit present.     Mental Status: She is alert and oriented to person, place, and time. Mental status is at baseline.  Psychiatric:        Mood and Affect: Mood normal.        Behavior: Behavior normal.        Thought Content: Thought content normal.        Judgment: Judgment normal.     Last depression screening scores PHQ 2/9 Scores 03/02/2020 02/19/2019 02/14/2018  PHQ - 2 Score 0 0 0  PHQ- 9 Score - - -   Last fall risk screening Fall Risk  03/02/2020  Falls in the past year? 0  Number falls in past yr: 0  Injury with Fall? 0   Last Audit-C alcohol use screening Alcohol Use Disorder Test (AUDIT) 03/02/2020  1. How often do you have a drink containing alcohol? 4  2. How many drinks containing alcohol do you have on a typical day when you are drinking? 0  3. How often do you have six or more drinks on one occasion? 0  AUDIT-C Score 4  4. How often during the last year have you found that you were not able to stop drinking once you had started? 0  5. How often during the last year have you failed to do what was normally expected from you because of drinking? 0  6. How often during the last year have you needed a first drink in the morning to get yourself going after a heavy drinking session? 0  7. How often during the last year have you had a feeling of guilt of remorse after drinking? 0  8. How often during the last year have you been unable to remember what happened the night before because you had been drinking? 0  9. Have you or  someone else been injured as a result of your drinking? 0  10. Has a relative or friend or a doctor or another health worker been concerned about your drinking or suggested you cut down? 0  Alcohol Use Disorder Identification Test Final Score (AUDIT) 4  Alcohol Brief Interventions/Follow-up AUDIT Score <7 follow-up not indicated   A score of 3 or more in women, and 4 or more in men indicates increased risk for alcohol abuse, EXCEPT if all of the points are from question 1   No results found for any  visits on 03/02/20.  Assessment & Plan    Routine Health Maintenance and Physical Exam  Exercise Activities and Dietary recommendations Goals    . Have 3 meals a day     Recommend eating 3 small meals a day with 2 healthy snacks in between.     . Reduce alcohol intake     Recommend to cut back on alcohol intake to no more than 1 drink a day.        Immunization History  Administered Date(s) Administered  . Fluad Quad(high Dose 65+) 05/14/2019  . Influenza, High Dose Seasonal PF 06/25/2015, 06/02/2016, 05/17/2017, 05/01/2018  . PFIZER SARS-COV-2 Vaccination 10/01/2019, 10/22/2019  . Pneumococcal Conjugate-13 05/16/2014  . Pneumococcal Polysaccharide-23 06/16/2007, 02/08/2016  . Zoster 09/17/2007  . Zoster Recombinat (Shingrix) 10/12/2018, 12/12/2018    Health Maintenance  Topic Date Due  . TETANUS/TDAP  08/15/2026 (Originally 05/14/1970)  . INFLUENZA VACCINE  03/15/2020  . MAMMOGRAM  02/09/2022  . COLONOSCOPY  10/10/2026  . DEXA SCAN  Completed  . COVID-19 Vaccine  Completed  . Hepatitis C Screening  Completed  . PNA vac Low Risk Adult  Completed    Discussed health benefits of physical activity, and encouraged her to engage in regular exercise appropriate for her age and condition.  1. Annual physical exam Normal physical exam today. Will check labs as below and f/u pending lab results. If labs are stable and WNL she will not need to have these rechecked for one year at her  next annual physical exam. She is to call the office in the meantime if she has any acute issue, questions or concerns. - Hemoglobin A1c - Lipid panel  2. Class 3 severe obesity due to excess calories with serious comorbidity and body mass index (BMI) of 40.0 to 44.9 in adult [facility]) Counseled patient on healthy lifestyle modifications including dieting and exercise.  - Hemoglobin A1c - Lipid panel  3. Abnormal liver enzymes Will check labs as below and f/u pending results. - Hepatic function panel  4. Bilateral leg numbness Patient only taking one gabapentin at bedtime. Updating dose. - gabapentin (NEURONTIN) 300 MG capsule; Take 1 capsule (300 mg total) by mouth daily.  Dispense: 90 capsule; Refill: 1  No follow-ups on file.     Reynolds Bowl, PA-C, have reviewed all documentation for this visit. The documentation on 03/04/20 for the exam, diagnosis, procedures, and orders are all accurate and complete.   Rubye Beach  [facility] [phone] (phone) [phone] (fax)  [place]

## 2020-03-02 NOTE — Patient Instructions (Signed)
Health Maintenance After Age 71 After age 71, you are at a higher risk for certain long-term diseases and infections as well as injuries from falls. Falls are a major cause of broken bones and head injuries in people who are older than age 71. Getting regular preventive care can help to keep you healthy and well. Preventive care includes getting regular testing and making lifestyle changes as recommended by your health care provider. Talk with your health care provider about:  Which screenings and tests you should have. A screening is a test that checks for a disease when you have no symptoms.  A diet and exercise plan that is right for you. What should I know about screenings and tests to prevent falls? Screening and testing are the best ways to find a health problem early. Early diagnosis and treatment give you the best chance of managing medical conditions that are common after age 71. Certain conditions and lifestyle choices may make you more likely to have a fall. Your health care provider may recommend:  Regular vision checks. Poor vision and conditions such as cataracts can make you more likely to have a fall. If you wear glasses, make sure to get your prescription updated if your vision changes.  Medicine review. Work with your health care provider to regularly review all of the medicines you are taking, including over-the-counter medicines. Ask your health care provider about any side effects that may make you more likely to have a fall. Tell your health care provider if any medicines that you take make you feel dizzy or sleepy.  Osteoporosis screening. Osteoporosis is a condition that causes the bones to get weaker. This can make the bones weak and cause them to break more easily.  Blood pressure screening. Blood pressure changes and medicines to control blood pressure can make you feel dizzy.  Strength and balance checks. Your health care provider may recommend certain tests to check your  strength and balance while standing, walking, or changing positions.  Foot health exam. Foot pain and numbness, as well as not wearing proper footwear, can make you more likely to have a fall.  Depression screening. You may be more likely to have a fall if you have a fear of falling, feel emotionally low, or feel unable to do activities that you used to do.  Alcohol use screening. Using too much alcohol can affect your balance and may make you more likely to have a fall. What actions can I take to lower my risk of falls? General instructions  Talk with your health care provider about your risks for falling. Tell your health care provider if: ? You fall. Be sure to tell your health care provider about all falls, even ones that seem minor. ? You feel dizzy, sleepy, or off-balance.  Take over-the-counter and prescription medicines only as told by your health care provider. These include any supplements.  Eat a healthy diet and maintain a healthy weight. A healthy diet includes low-fat dairy products, low-fat (lean) meats, and fiber from whole grains, beans, and lots of fruits and vegetables. Home safety  Remove any tripping hazards, such as rugs, cords, and clutter.  Install safety equipment such as grab bars in bathrooms and safety rails on stairs.  Keep rooms and walkways well-lit. Activity   Follow a regular exercise program to stay fit. This will help you maintain your balance. Ask your health care provider what types of exercise are appropriate for you.  If you need a cane or   walker, use it as recommended by your health care provider.  Wear supportive shoes that have nonskid soles. Lifestyle  Do not drink alcohol if your health care provider tells you not to drink.  If you drink alcohol, limit how much you have: ? 0-1 drink a day for women. ? 0-2 drinks a day for men.  Be aware of how much alcohol is in your drink. In the [place] one drink equals one typical bottle of beer (12  oz), one-half glass of wine (5 oz), or one shot of hard liquor (1 oz).  Do not use any products that contain nicotine or tobacco, such as cigarettes and e-cigarettes. If you need help quitting, ask your health care provider. Summary  Having a healthy lifestyle and getting preventive care can help to protect your health and wellness after age 71.  Screening and testing are the best way to find a health problem early and help you avoid having a fall. Early diagnosis and treatment give you the best chance for managing medical conditions that are more common for people who are older than age 71.  Falls are a major cause of broken bones and head injuries in people who are older than age 71. Take precautions to prevent a fall at home.  Work with your health care provider to learn what changes you can make to improve your health and wellness and to prevent falls. This information is not intended to replace advice given to you by your health care provider. Make sure you discuss any questions you have with your health care provider. Document Revised: 11/22/2018 Document Reviewed: 06/14/2017 Elsevier Patient Education  2020 [employer]vier Inc[employer]  

## 2020-03-03 ENCOUNTER — Telehealth: Payer: Self-pay

## 2020-03-03 LAB — LIPID PANEL
Chol/HDL Ratio: 2.7 ratio (ref 0.0–4.4)
Cholesterol, Total: 215 mg/dL — ABNORMAL HIGH (ref 100–199)
HDL: 80 mg/dL (ref >39)
LDL Chol Calc (NIH): 117 mg/dL — ABNORMAL HIGH (ref 0–99)
Triglycerides: 101 mg/dL (ref 0–149)
VLDL Cholesterol Cal: 18 mg/dL (ref 5–40)

## 2020-03-03 LAB — HEPATIC FUNCTION PANEL
ALT: 54 IU/L — ABNORMAL HIGH (ref 0–32)
AST: 40 IU/L (ref 0–40)
Albumin: 4.6 g/dL (ref 3.8–4.8)
Alkaline Phosphatase: 141 IU/L — ABNORMAL HIGH (ref 48–121)
Bilirubin Total: 0.5 mg/dL (ref 0.0–1.2)
Bilirubin, Direct: 0.12 mg/dL (ref 0.00–0.40)
Total Protein: 6.9 g/dL (ref 6.0–8.5)

## 2020-03-03 LAB — HEMOGLOBIN A1C
Est. average glucose Bld gHb Est-mCnc: 111 mg/dL
Hgb A1c MFr Bld: 5.5 % (ref 4.8–5.6)

## 2020-03-03 NOTE — Telephone Encounter (Signed)
Written by Mar Daring, PA-C on 03/03/2020 11:22 AM EDT Seen by patient Michelle Pitts on 03/03/2020 1:01 PM

## 2020-03-03 NOTE — Telephone Encounter (Signed)
-----   Message from Mar Daring, [place] sent at 03/03/2020 11:22 AM EDT ----- A1c/sugar is normal. Cholesterol has improved slightly compared to last year. Liver enzymes are elevated but essentially stable compared to previous 4 years.

## 2020-04-02 ENCOUNTER — Encounter: Payer: Self-pay | Admitting: Physician Assistant

## 2020-04-02 NOTE — Telephone Encounter (Signed)
Michelle Pitts,  Can we try to email this to her email in [place] message?  Thanks,  JB

## 2020-04-10 ENCOUNTER — Encounter: Payer: Self-pay | Admitting: Physician Assistant

## 2020-04-10 DIAGNOSIS — I10 Essential (primary) hypertension: Secondary | ICD-10-CM

## 2020-04-10 MED ORDER — METOPROLOL SUCCINATE ER 25 MG PO TB24: 25.0000 mg | ORAL_TABLET | Freq: Every day | ORAL | 3 refills | Status: DC

## 2020-04-15 ENCOUNTER — Ambulatory Visit
Admission: RE | Admit: 2020-04-15 | Discharge: 2020-04-15 | Disposition: A | Discharge status: Home / Self Care | Payer: Medicare PPO | Source: Ambulatory Visit | Attending: Radiation Oncology | Admitting: Radiation Oncology

## 2020-04-15 ENCOUNTER — Other Ambulatory Visit: Payer: Self-pay

## 2020-04-15 DIAGNOSIS — C3432 Malignant neoplasm of lower lobe, left bronchus or lung: Secondary | ICD-10-CM | POA: Diagnosis present

## 2020-04-15 LAB — POCT I-STAT CREATININE: Creatinine, Ser: 0.9 mg/dL (ref 0.44–1.00)

## 2020-04-15 MED ORDER — IOHEXOL 300 MG/ML  SOLN
75.0000 mL | Freq: Once | INTRAMUSCULAR | Status: AC | PRN
  Administered 2020-04-15: 75 mL via INTRAVENOUS

## 2020-04-16 ENCOUNTER — Encounter: Payer: Self-pay | Admitting: Physician Assistant

## 2020-04-16 DIAGNOSIS — I700 Atherosclerosis of aorta: Secondary | ICD-10-CM

## 2020-04-16 DIAGNOSIS — I2510 Atherosclerotic heart disease of native coronary artery without angina pectoris: Secondary | ICD-10-CM

## 2020-04-16 NOTE — Addendum Note (Signed)
Addended by: Mar Daring on: 04/16/2020 07:31 PM   Modules accepted: Orders

## 2020-04-21 ENCOUNTER — Other Ambulatory Visit: Payer: Self-pay

## 2020-04-21 ENCOUNTER — Encounter: Payer: Self-pay | Admitting: Cardiovascular Disease

## 2020-04-21 ENCOUNTER — Ambulatory Visit: Payer: Medicare PPO | Admitting: Cardiovascular Disease

## 2020-04-21 VITALS — BP 140/80 | HR 94 | Ht 64.5 in | Wt 262.2 lb

## 2020-04-21 DIAGNOSIS — I700 Atherosclerosis of aorta: Secondary | ICD-10-CM | POA: Diagnosis not present

## 2020-04-21 DIAGNOSIS — R0602 Shortness of breath: Secondary | ICD-10-CM

## 2020-04-21 DIAGNOSIS — I2510 Atherosclerotic heart disease of native coronary artery without angina pectoris: Secondary | ICD-10-CM

## 2020-04-21 DIAGNOSIS — E7800 Pure hypercholesterolemia, unspecified: Secondary | ICD-10-CM

## 2020-04-21 MED ORDER — EZETIMIBE 10 MG PO TABS: 10.0000 mg | ORAL_TABLET | Freq: Every day | ORAL | 11 refills | Status: DC

## 2020-04-21 NOTE — Patient Instructions (Addendum)
Medication Instructions:  1) Please start zetia 10 mg- take 1 tablet once a day for cholesterol  If you need a refill on your cardiac medications before your next appointment, please call your pharmacy.    Lab work: No new labs needed   If you have labs (blood work) drawn today and your tests are completely normal, you will receive your results only by: Marland Kitchen MyChart Message (if you have MyChart) OR . A paper copy in the mail If you have any lab test that is abnormal or we need to change your treatment, we will call you to review the results.   Testing/Procedures: - Your physician has requested that you have an echocardiogram. Echocardiography is a painless test that uses sound waves to create images of your heart. It provides your doctor with information about the size and shape of your heart and how well your heart's chambers and valves are working. This procedure takes approximately one hour. There are no restrictions for this procedure. There is a possibility that an IV may need to be started during your test to inject an image enhancing agent. This is done to obtain more optimal pictures of your heart. Therefore we ask that you do at least drink some water prior to coming in to hydrate your veins.   Follow-Up: At [facility], you and your health needs are our priority.  As part of our continuing mission to provide you with exceptional heart care, we have created designated Provider Care Teams.  These Care Teams include your primary Cardiologist (physician) and Advanced Practice Providers (APPs -  Physician Assistants and Nurse Practitioners) who all work together to provide you with the care you need, when you need it.  . You will need a follow up appointment in 12 months  . Providers on your designated Care Team:   . Murray Hodgkins, NP . Christell Faith, PA-C . Marrianne Mood, PA-C  Any Other Special Instructions Will Be Listed Below (If Applicable).  COVID-19 Vaccine Information  can be found at: ShippingScam.co.uk For questions related to vaccine distribution or appointments, please email vaccine@[email or web address] .[email or web address] or call [phone])[phone].    Echocardiogram An echocardiogram is a procedure that uses painless sound waves (ultrasound) to produce an image of the heart. Images from an echocardiogram can provide important information about:  Signs of coronary artery disease (CAD).  Aneurysm detection. An aneurysm is a weak or damaged part of an artery wall that bulges out from the normal force of blood pumping through the body.  Heart size and shape. Changes in the size or shape of the heart can be associated with certain conditions, including heart failure, aneurysm, and CAD.  Heart muscle function.  Heart valve function.  Signs of a past heart attack.  Fluid buildup around the heart.  Thickening of the heart muscle.  A tumor or infectious growth around the heart valves. Tell a health care provider about:  Any allergies you have.  All medicines you are taking, including vitamins, herbs, eye drops, creams, and over-the-counter medicines.  Any blood disorders you have.  Any surgeries you have had.  Any medical conditions you have.  Whether you are pregnant or may be pregnant. What are the risks? Generally, this is a safe procedure. However, problems may occur, including:  Allergic reaction to dye (contrast) that may be used during the procedure. What happens before the procedure? No specific preparation is needed. You may eat and drink normally. What happens during the procedure?   An IV tube may  be inserted into one of your veins.  You may receive contrast through this tube. A contrast is an injection that improves the quality of the pictures from your heart.  A gel will be applied to your chest.  A wand-like tool (transducer) will be moved over your chest. The gel will help to transmit the  sound waves from the transducer.  The sound waves will harmlessly bounce off of your heart to allow the heart images to be captured in real-time motion. The images will be recorded on a computer. The procedure may vary among health care providers and hospitals. What happens after the procedure?  You may return to your normal, everyday life, including diet, activities, and medicines, unless your health care provider tells you not to do that. Summary  An echocardiogram is a procedure that uses painless sound waves (ultrasound) to produce an image of the heart.  Images from an echocardiogram can provide important information about the size and shape of your heart, heart muscle function, heart valve function, and fluid buildup around your heart.  You do not need to do anything to prepare before this procedure. You may eat and drink normally.  After the echocardiogram is completed, you may return to your normal, everyday life, unless your health care provider tells you not to do that. This information is not intended to replace advice given to you by your health care provider. Make sure you discuss any questions you have with your health care provider. Document Revised: 11/22/2018 Document Reviewed: 09/03/2016 Elsevier Patient Education  [employer].

## 2020-04-21 NOTE — Progress Notes (Signed)
Cardiology Office Note  Date:  04/21/2020   ID:  [name], DOB 08/13/1951, MRN [id number]  PCP:  Mar Daring, PA-C   Chief Complaint  Patient presents with  . New Patient (Initial Visit)    Ref by Dr. Marlyn Corporal for CHF; Meds verbally reviewed with patient.    HPI:  [name] is a 69 year old woman with past medical history of Breast cancer Lung cancer, XRT Statin intolerance XRT in 2015, breast XRT 2019, lung Who presents by referral from Michelle Pitts for aortic atherosclerosis on CT scan Coronary calcification  Previously seen at [facility] December 2016 Notes indicating history of tachycardia, hypertension Mild exertional shortness of breath Had 24-hour monitor December 2016 normal rhythm, PVCs, 3 beat run SVT  Echo December 2016 normal ejection fraction, mild AI Was on metoprolol at the time  Long discussion concerning her radiation all of which has been on the left Radiation for breast cancer on the left 2015, More radiation on the left for lung cancer 2019 Has been told she has some scarring, vertebral fractures  CT scan images pulled up showing mild aortic atherosclerosis, mild coronary calcification  Long discussion concerning shortness of breath symptoms She is sedentary, deconditioned, weight has been trending higher over the past year Denies significant swelling Took several Lasix, did not seem to make any difference with her shortness of breath BNP running low  EKG personally reviewed by myself on todays visit Shows normal sinus rhythm rate 94 bpm nonspecific T wave abnormality no significant ST-T wave changes  PMH:   has a past medical history of Anxiety, Breast cancer ([place]) (01/23/2014), Cancer ([place]) (1995), COPD (chronic obstructive pulmonary disease) ([place]), Dysrhythmia, GERD (gastroesophageal reflux disease), History of colon polyps, Hypercholesterolemia, Hypertension, Hypothyroidism, Insomnia, Lung cancer ([place]), Papilloma of  breast (01/09/2014), Personal history of radiation therapy, Personal history of tobacco use, presenting hazards to health (11/18/2015), Sleep apnea, Sleep apnea, SOB (shortness of breath) on exertion, Spinal stenosis, Thyroid disease, and Vitamin D deficiency.  PSH:    Past Surgical History:  Procedure Laterality Date  . BLADDER SUSPENSION  2001  . BREAST BIOPSY Right   . BREAST BIOPSY Left   . BREAST CYST ASPIRATION Left 1985  . BREAST EXCISIONAL BIOPSY Right 1975   x 2  . BREAST SURGERY Left 01/23/2014   T1c, N0; Er/ PR positive, Her 2 negative   2 years anti-estrogen RX. Oncotype DX: 13,Low risk.9% over 10 years  . BREAST SURGERY Left 04/10/2014   Debridement of fat necrosis, left breast cancer wide excision site.   . CHOLECYSTECTOMY    . COLONOSCOPY  2014   Dr Tiffany Kocher  . COLONOSCOPY WITH PROPOFOL N/A 10/10/2016   Procedure: COLONOSCOPY WITH PROPOFOL;  Surgeon: Manya Silvas, MD;  Location: [facility] ENDOSCOPY;  Service: Endoscopy;  Laterality: N/A;  . ELECTROMAGNETIC NAVIGATION BROCHOSCOPY N/A 04/12/2018   Procedure: ELECTROMAGNETIC NAVIGATION BRONCHOSCOPY;  Surgeon: Flora Lipps, MD;  Location: [facility] ORS;  Service: Cardiopulmonary;  Laterality: N/A;  . LAPAROSCOPIC BILATERAL SALPINGO OOPHERECTOMY Bilateral 06/27/2018   Procedure: LAPAROSCOPIC BILATERAL SALPINGO OOPHORECTOMY;  Surgeon: Homero Fellers, MD;  Location: [facility] ORS;  Service: Gynecology;  Laterality: Bilateral;  . ROTATOR CUFF REPAIR Right 1995  . VAGINAL HYSTERECTOMY  1978   partial  . VULVECTOMY PARTIAL  1995  . wide excision debriedment Left 04/10/14    Current Outpatient Medications  Medication Sig Dispense Refill  . gabapentin (NEURONTIN) 300 MG capsule Take 1 capsule (300 mg total) by mouth daily. 90 capsule 1  .  ibuprofen (ADVIL) 600 MG tablet TAKE 1 TABLET(600 MG) BY MOUTH EVERY 8 HOURS AS NEEDED 90 tablet 1  . metoprolol succinate (TOPROL-XL) 25 MG 24 hr tablet Take 1 tablet (25 mg total) by mouth daily. 90  tablet 3  . Multiple Vitamins-Minerals (VITAMIN D3 COMPLETE PO) Take by mouth daily. Dose unknown    . ezetimibe (ZETIA) 10 MG tablet Take 1 tablet (10 mg total) by mouth daily. 30 tablet 11  . furosemide (LASIX) 20 MG tablet Take 1 tablet (20 mg total) by mouth daily. (Patient not taking: Reported on 03/02/2020) 30 tablet 3   No current facility-administered medications for this visit.    Allergies:   Codeine, Gramineae pollens, Hydrochlorothiazide, and Levaquin [levofloxacin in d5w]   Social History:  The patient  reports that she quit smoking about 5 years ago. Her smoking use included cigarettes. She has a 50.00 pack-year smoking history. She has never used smokeless tobacco. She reports current alcohol use of about 14.0 standard drinks of alcohol per week. She reports that she does not use drugs.   Family History:   family history includes COPD in her mother; Cancer in an other family member; Cancer (age of onset: 69) in her maternal grandfather; Cancer (age of onset: 38) in her paternal grandfather; Crohn's disease in her sister; Diabetes in her father and sister; Emphysema in her brother, brother, and sister; Heart failure in her sister; Hypertension in her sister, sister, and sister; Lung cancer in her brother.    Review of Systems: Review of Systems  Constitutional: Negative.   HENT: Negative.   Respiratory: Negative.   Cardiovascular: Negative.   Gastrointestinal: Negative.   Musculoskeletal: Negative.   Neurological: Negative.   Psychiatric/Behavioral: Negative.   All other systems reviewed and are negative.   PHYSICAL EXAM: VS:  BP 140/80 (BP Location: Right Arm, Patient Position: Sitting, Cuff Size: Large)   Pulse 94   Ht 5' 4.5" (1.638 m)   Wt 262 lb 4 oz (119 kg)   SpO2 97%   BMI 44.32 kg/m  , BMI Body mass index is 44.32 kg/m. GEN: Well nourished, well developed, in no acute distress HEENT: normal Neck: no JVD, carotid bruits, or masses Cardiac: RRR; no murmurs,  rubs, or gallops,no edema  Respiratory:  clear to auscultation bilaterally, normal work of breathing GI: soft, nontender, nondistended, + BS MS: no deformity or atrophy Skin: warm and dry, no rash Neuro:  Strength and sensation are intact Psych: euthymic mood, full affect   Recent Labs: 09/13/2019: TSH 3.000 02/13/2020: BNP 16.4; BUN 14; Hemoglobin 13.5; Platelets 216; Potassium 4.6; Sodium 143 03/02/2020: ALT 54 04/15/2020: Creatinine, Ser 0.90    Lipid Panel Lab Results  Component Value Date   CHOL 215 (H) 03/02/2020   HDL 80 03/02/2020   LDLCALC 117 (H) 03/02/2020   TRIG 101 03/02/2020      Wt Readings from Last 3 Encounters:  04/21/20 262 lb 4 oz (119 kg)  03/02/20 266 lb 3.2 oz (120.7 kg)  02/13/20 263 lb 14.4 oz (119.7 kg)      ASSESSMENT AND PLAN:  Problem List Items Addressed This Visit      Cardiology Problems   Aortic atherosclerosis (HCC) - Primary   Relevant Medications   ezetimibe (ZETIA) 10 MG tablet   Other Relevant Orders   EKG 12-Lead   Coronary artery calcification seen on CT scan   Relevant Medications   ezetimibe (ZETIA) 10 MG tablet   Other Relevant Orders   EKG 12-Lead  Pure hypercholesterolemia   Relevant Medications   ezetimibe (ZETIA) 10 MG tablet     Other   Shortness of breath   Relevant Orders   ECHOCARDIOGRAM COMPLETE     Aortic atherosclerosis Mild, images reviewed with her on CT Does not want statin given prior myalgias She is willing to start Zetia 10 mg daily  Coronary calcification Mild on CT, Offered the option for stress Myoview, she has declined at this time Zetia as above  Shortness of breath Likely multifactorial including morbid obesity, deconditioning, emphysema which is mild on CT and fibrosis from radiation Recommended weight loss, walking program Echocardiogram ordered to rule out pulmonary hypertension (less likely in the setting of normal BNP) No strong indication for Lasix at this time, will wait on  results of her echo  Morbid obesity  We have encouraged continued exercise, careful diet management in an effort to lose weight.   Lung cancer/breast cancer  significant radiation on the left Discussed how radiation can accelerate underlying aortic atherosclerosis and coronary calcifications Stressed importance of aggressive lipid control   Long discussion concerning shortness of breath symptoms, CT scan images reviewed in detail  Total encounter time more than 45 minutes  Greater than 50% was spent in counseling and coordination of care with the patient    Signed, Esmond Plants, M.D., Ph.D. [place], [place]

## 2020-04-22 ENCOUNTER — Ambulatory Visit: Payer: Medicare Other | Admitting: Radiation Oncology

## 2020-04-29 ENCOUNTER — Encounter: Payer: Self-pay | Admitting: Radiation Oncology

## 2020-04-29 ENCOUNTER — Ambulatory Visit
Admission: RE | Admit: 2020-04-29 | Discharge: 2020-04-29 | Disposition: A | Discharge status: Home / Self Care | Payer: Medicare PPO | Source: Ambulatory Visit | Attending: Radiation Oncology | Admitting: Radiation Oncology

## 2020-04-29 ENCOUNTER — Telehealth: Payer: Self-pay | Admitting: Internal Medicine

## 2020-04-29 ENCOUNTER — Other Ambulatory Visit: Payer: Self-pay

## 2020-04-29 ENCOUNTER — Encounter: Payer: Self-pay | Admitting: Physician Assistant

## 2020-04-29 VITALS — BP 162/70 | HR 89 | Temp 97.7°F | Resp 20 | Wt 259.3 lb

## 2020-04-29 DIAGNOSIS — Z923 Personal history of irradiation: Secondary | ICD-10-CM | POA: Diagnosis not present

## 2020-04-29 DIAGNOSIS — E669 Obesity, unspecified: Secondary | ICD-10-CM | POA: Diagnosis not present

## 2020-04-29 DIAGNOSIS — C3432 Malignant neoplasm of lower lobe, left bronchus or lung: Secondary | ICD-10-CM

## 2020-04-29 DIAGNOSIS — R05 Cough: Secondary | ICD-10-CM | POA: Diagnosis not present

## 2020-04-29 DIAGNOSIS — Z85118 Personal history of other malignant neoplasm of bronchus and lung: Secondary | ICD-10-CM | POA: Diagnosis present

## 2020-04-29 DIAGNOSIS — Z6841 Body Mass Index (BMI) 40.0 and over, adult: Secondary | ICD-10-CM | POA: Insufficient documentation

## 2020-04-29 NOTE — Progress Notes (Signed)
Radiation Oncology Follow up Note  Name: Michelle Pitts   Date:   04/29/2020 MRN:  [id number] DOB: 1951-01-28    This 69 y.o. female presents to the clinic today for 2-year follow-up status post SBRT to left lower lobe for stage I non-small cell lung cancer.  REFERRING PROVIDER: Florian Buff*  HPI: Patient is a 69 year old female now out 2 years having completed SBRT to her left lower lobe for stage I non-small cell lung cancer.  Seen today in routine follow-up she is doing fairly well..  She does have on recent CT scan which I have reviewed unchanged posttreatment appearance of the left lower lobe with a bandlike post radiation scarring no evidence of recurrent metastatic disease.  Derm new mildly displaced acute to subacute appearing fractures of the lateral left sixth and posterior left seventh rib.  She has no history of trauma.  She also has on MRI scan back in December what radiologist describing is post radiation changes of the bone marrow T5-T7 although this area received no radiation as well as her ribs.  She has a mild nonproductive cough.  She is obese  COMPLICATIONS OF TREATMENT: none  FOLLOW UP COMPLIANCE: keeps appointments   PHYSICAL EXAM:  BP (!) 162/70 (BP Location: Left Arm, Patient Position: Sitting)   Pulse 89   Temp 97.7 F (36.5 C) (Tympanic)   Resp 20   Wt 259 lb 4.8 oz (117.6 kg)   BMI 43.82 kg/m  Obese appearing female in NAD.  Well-developed well-nourished patient in NAD. HEENT reveals PERLA, EOMI, discs not visualized.  Oral cavity is clear. No oral mucosal lesions are identified. Neck is clear without evidence of cervical or supraclavicular adenopathy. Lungs are clear to A&P. Cardiac examination is essentially unremarkable with regular rate and rhythm without murmur rub or thrill. Abdomen is benign with no organomegaly or masses noted. Motor sensory and DTR levels are equal and symmetric in the upper and lower extremities. Cranial nerves II through  XII are grossly intact. Proprioception is intact. No peripheral adenopathy or edema is identified. No motor or sensory levels are noted. Crude visual fields are within normal range.  RADIOLOGY RESULTS: MRI scans and CT scans reviewed compatible with above-stated findings  PLAN: Present time she is doing well with no evidence of disease.  Unclear of the etiology of findings on MRI scan of her spine as well as her rib fractures those areas on my review received extremely low doses of radiation not enough to cause fracture or any bone marrow changes.  I have asked to see her back in 1 year for follow-up.  Patient knows to call with any concerns.  I would like to take this opportunity to thank you for allowing me to participate in the care of your patient.Noreene Filbert, MD

## 2020-04-29 NOTE — Telephone Encounter (Signed)
04/29/2020 Left vm for pt informing her of f/u MD appt that was made per Lennox Grumbles on 07/29/20 @ 2:45 srw

## 2020-04-30 ENCOUNTER — Encounter: Payer: Self-pay | Admitting: Physician Assistant

## 2020-04-30 ENCOUNTER — Other Ambulatory Visit: Payer: Self-pay | Admitting: *Deleted

## 2020-04-30 DIAGNOSIS — J411 Mucopurulent chronic bronchitis: Secondary | ICD-10-CM

## 2020-04-30 DIAGNOSIS — C3432 Malignant neoplasm of lower lobe, left bronchus or lung: Secondary | ICD-10-CM

## 2020-04-30 DIAGNOSIS — J449 Chronic obstructive pulmonary disease, unspecified: Secondary | ICD-10-CM

## 2020-04-30 DIAGNOSIS — C3431 Malignant neoplasm of lower lobe, right bronchus or lung: Secondary | ICD-10-CM

## 2020-04-30 DIAGNOSIS — J95811 Postprocedural pneumothorax: Secondary | ICD-10-CM

## 2020-05-06 ENCOUNTER — Ambulatory Visit: Payer: Medicare PPO

## 2020-05-07 ENCOUNTER — Ambulatory Visit (INDEPENDENT_AMBULATORY_CARE_PROVIDER_SITE_OTHER): Payer: Medicare PPO

## 2020-05-07 ENCOUNTER — Other Ambulatory Visit: Payer: Self-pay

## 2020-05-07 DIAGNOSIS — Z23 Encounter for immunization: Secondary | ICD-10-CM

## 2020-05-07 NOTE — Telephone Encounter (Signed)
Michelle Pitts,  Patient is asking about the referral to Pulmonology. I can see it has been authorized, but couldn't see past that. Could you update her on the status please?  JB

## 2020-05-13 NOTE — Telephone Encounter (Signed)
Michelle Pitts,  I am so sorry. Patient declines to go to [facility]. Can we refer to another pulmonologist. She is open to anywhere. Do you need a whole new referral? I don't want to bog the system down with repeat referrals if I don't have to.  JB

## 2020-05-15 ENCOUNTER — Other Ambulatory Visit: Payer: Self-pay | Admitting: Surgery

## 2020-05-15 ENCOUNTER — Ambulatory Visit (INDEPENDENT_AMBULATORY_CARE_PROVIDER_SITE_OTHER): Payer: Medicare PPO

## 2020-05-15 ENCOUNTER — Other Ambulatory Visit: Payer: Self-pay

## 2020-05-15 DIAGNOSIS — R0602 Shortness of breath: Secondary | ICD-10-CM | POA: Diagnosis not present

## 2020-05-15 LAB — ECHOCARDIOGRAM COMPLETE
AR max vel: 2.17 cm2
AV Area VTI: 1.96 cm2
AV Area mean vel: 2.16 cm2
AV Mean grad: 5 mmHg
AV Peak grad: 11.3 mmHg
Ao pk vel: 1.68 m/s
Area-P 1/2: 5.09 cm2

## 2020-05-15 MED ORDER — PERFLUTREN LIPID MICROSPHERE
1.0000 mL | INTRAVENOUS | Status: AC | PRN
  Administered 2020-05-15: 2 mL via INTRAVENOUS

## 2020-05-17 ENCOUNTER — Encounter: Payer: Self-pay | Admitting: Physician Assistant

## 2020-05-18 ENCOUNTER — Encounter (INDEPENDENT_AMBULATORY_CARE_PROVIDER_SITE_OTHER): Payer: Self-pay

## 2020-05-21 ENCOUNTER — Ambulatory Visit
Admission: RE | Admit: 2020-05-21 | Discharge: 2020-05-21 | Disposition: A | Discharge status: Home / Self Care | Payer: Medicare PPO | Source: Ambulatory Visit | Attending: Surgery | Admitting: Surgery

## 2020-05-21 ENCOUNTER — Other Ambulatory Visit: Payer: Self-pay

## 2020-05-21 ENCOUNTER — Other Ambulatory Visit
Admission: RE | Admit: 2020-05-21 | Discharge: 2020-05-21 | Disposition: A | Discharge status: Home / Self Care | Payer: Medicare PPO | Source: Home / Self Care | Attending: Surgery | Admitting: Surgery

## 2020-05-21 DIAGNOSIS — R9431 Abnormal electrocardiogram [ECG] [EKG]: Secondary | ICD-10-CM | POA: Diagnosis not present

## 2020-05-21 DIAGNOSIS — Z01818 Encounter for other preprocedural examination: Secondary | ICD-10-CM | POA: Insufficient documentation

## 2020-05-21 LAB — VITAMIN B12: Vitamin B-12: 328 pg/mL (ref 180–914)

## 2020-05-21 LAB — CBC WITH DIFFERENTIAL/PLATELET
Abs Immature Granulocytes: 0.02 10*3/uL (ref 0.00–0.07)
Basophils Absolute: 0.1 10*3/uL (ref 0.0–0.1)
Basophils Relative: 1 %
Eosinophils Absolute: 0.1 10*3/uL (ref 0.0–0.5)
Eosinophils Relative: 1 %
HCT: 40.3 % (ref 36.0–46.0)
Hemoglobin: 13.5 g/dL (ref 12.0–15.0)
Immature Granulocytes: 0 %
Lymphocytes Relative: 21 %
Lymphs Abs: 1.2 10*3/uL (ref 0.7–4.0)
MCH: 31.6 pg (ref 26.0–34.0)
MCHC: 33.5 g/dL (ref 30.0–36.0)
MCV: 94.4 fL (ref 80.0–100.0)
Monocytes Absolute: 0.5 10*3/uL (ref 0.1–1.0)
Monocytes Relative: 8 %
Neutro Abs: 3.9 10*3/uL (ref 1.7–7.7)
Neutrophils Relative %: 69 %
Platelets: 213 10*3/uL (ref 150–400)
RBC: 4.27 MIL/uL (ref 3.87–5.11)
RDW: 13.1 % (ref 11.5–15.5)
WBC: 5.7 10*3/uL (ref 4.0–10.5)
nRBC: 0 % (ref 0.0–0.2)

## 2020-05-21 LAB — LIPID PANEL
Cholesterol: 205 mg/dL — ABNORMAL HIGH (ref 0–200)
HDL: 71 mg/dL (ref >40)
LDL Cholesterol: 115 mg/dL — ABNORMAL HIGH (ref 0–99)
Total CHOL/HDL Ratio: 2.9 RATIO
Triglycerides: 96 mg/dL (ref <150)
VLDL: 19 mg/dL (ref 0–40)

## 2020-05-21 LAB — COMPREHENSIVE METABOLIC PANEL
ALT: 40 U/L (ref 0–44)
AST: 32 U/L (ref 15–41)
Albumin: 4.3 g/dL (ref 3.5–5.0)
Alkaline Phosphatase: 114 U/L (ref 38–126)
Anion gap: 11 (ref 5–15)
BUN: 15 mg/dL (ref 8–23)
CO2: 28 mmol/L (ref 22–32)
Calcium: 9.6 mg/dL (ref 8.9–10.3)
Chloride: 103 mmol/L (ref 98–111)
Creatinine, Ser: 0.76 mg/dL (ref 0.44–1.00)
GFR calc non Af Amer: >60 mL/min (ref >60)
Glucose, Bld: 115 mg/dL — ABNORMAL HIGH (ref 70–99)
Potassium: 4.4 mmol/L (ref 3.5–5.1)
Sodium: 142 mmol/L (ref 135–145)
Total Bilirubin: 0.7 mg/dL (ref 0.3–1.2)
Total Protein: 7.5 g/dL (ref 6.5–8.1)

## 2020-05-21 LAB — HEMOGLOBIN A1C
Hgb A1c MFr Bld: 5.6 % (ref 4.8–5.6)
Mean Plasma Glucose: 114.02 mg/dL

## 2020-05-21 LAB — TSH: TSH: 2.361 u[IU]/mL (ref 0.350–4.500)

## 2020-05-21 LAB — IRON: Iron: 101 ug/dL (ref 28–170)

## 2020-05-22 LAB — CALCITRIOL (1,25 DI-OH VIT D): Vit D, 1,25-Dihydroxy: 62.3 pg/mL (ref 19.9–79.3)

## 2020-05-23 LAB — H. PYLORI BREATH TEST: H. pylori UBiT: NEGATIVE

## 2020-05-25 LAB — VITAMIN B1: Vitamin B1 (Thiamine): 103 nmol/L (ref 66.5–200.0)

## 2020-06-01 ENCOUNTER — Encounter: Payer: Self-pay | Admitting: Dietician

## 2020-06-01 ENCOUNTER — Other Ambulatory Visit: Payer: Self-pay

## 2020-06-01 ENCOUNTER — Encounter: Payer: Medicare PPO | Attending: Surgery | Admitting: Dietician

## 2020-06-01 VITALS — Ht 64.5 in | Wt 262.6 lb

## 2020-06-01 DIAGNOSIS — Z713 Dietary counseling and surveillance: Secondary | ICD-10-CM | POA: Diagnosis not present

## 2020-06-01 DIAGNOSIS — Z6841 Body Mass Index (BMI) 40.0 and over, adult: Secondary | ICD-10-CM | POA: Insufficient documentation

## 2020-06-01 DIAGNOSIS — E785 Hyperlipidemia, unspecified: Secondary | ICD-10-CM | POA: Diagnosis not present

## 2020-06-01 DIAGNOSIS — E669 Obesity, unspecified: Secondary | ICD-10-CM | POA: Diagnosis present

## 2020-06-01 NOTE — Patient Instructions (Signed)
   Reduce fluids when eating.   Practice chewing food more, work up to 30 chews for each bite of food.  Have an evening snack daily to eat at least 2 times a day. Consider what food might work to eat a few bites late morning.

## 2020-06-01 NOTE — Progress Notes (Signed)
Nutrition Assessment for Bariatric Surgery   Appointment start time: 1330   end time: 1430  Planned Surgery: Sleeve Gastrectomy  Anthropometrics: Weight: 262.6lbs Height: 5'4" BMI: 44.38   Patient's Goal Weight: 150lbs  Clinical: Medical History: mild COPD, HTN, hyperlipidemia, sleep apnea, history of breast and lung cancer Medications and Supplements: ezetimibe, gabapentin, ibuprofen, metoprolol, Vitamin D3 Relevant labs: 05/21/20 -- HbA1C 5.6%, glucose 115, total cholesterol 205, Triglycerides 96, LDL 115, HDL 71 Notable symptoms: patient reports decreased breathing capacity due to previous cancer treatments Drug allergies: codeine, hydrochlorothiazide, levaquin Food allergies: none known  Lifestyle and Dietary History:  Dieting/ weight history:   Patient reports weight gain of >60lbs since having cancer treatments in 2016.   Tried herbalife in 2020, did not work well.     Has worked with RD, [employer] watchers x2; bariatric clinic x2; -- all worked temporarily, but regained weight.   Also took phentermine and Phen-fen with short term success;   Other diets including slimfast shakes.  Disordered or emotional eating history: none  She reports lack of hunger during the day, and has eaten one meal daily for multiple years. She periodically tries to eat a breakfast, but might feel nauseated or gag on the food. She is sensitive to sweet taste, and does not eat or like sweet foods or drinks.   Physical activity: limited due to back, lung tissue scarring from radiation therapy  Dietary Recall:  Daily pattern: 1 meals and 0-1 snacks. Dining out: 7 meals per week. Breakfast:  No food, drinks coffee with sugar substitute  Snack: none Lunch: 3pm out -- salmon, broccoli, salad occ 1/2 potato; grilled chicken, salad, baked potato; hamburger steak, pinto beans, fried okra; veg plate -- broc casserole, peas and corn, squash casserole < 1 cup portions. Does not usually cook due to challenge  of cooking for one, does not like leftovers. Snack: none Supper:sometimes none, sometimes [place] "balanced break"-- cheese, nuts, dried fruit (150kcal); small pkg chips  Snack: none Beverages: water 3-4 glasses during meal, 1c coffee, 1 beer or wine   Nutrition Intervention:  Instructed patient on pre-op diet goals and importance of close adherence to bariatric diet after surgery to avoid side effects and complications.   Discussed stages of the bariatric diet after surgery as well as the importance of adequate protein and fluid intake.   Provided overview of 2-week pre-op diet.   Instructed on importance of eating at regular intervals post-op to maximize nutritional intake and absorption, and reduce risk of deficiencies; discussed options for gradually adding more meals/ snacks during the day.  Discussed less-sweet options for protein drinks for pre-op and post-op diets.   Nutrition Diagnosis: [place]-3.3 Overweight/ obesity related to history of excess calories and inadequate physical activity as evidenced by patient with current BMI of 44.38, working on dietary changes for weight loss and adequate nutrition prior to bariatric surgery.  Teaching method(s) utilized: Acupuncturist provided: . Pre-op Goals . Diet Stage Template . Visit summary with goals and instructions  Learning Readiness: Change in progress  Barriers to learning/ implementing lifestyle change: none  Demonstrated degree of understanding via: Teach Back  Summary:  Patient has begun making diet and lifestyle changes in effort to lose weight and prepare for bariatric surgery.   Patient's goal for weight loss is improving breathing and stamina and reducing risk of other health issues.  She has solid support from family (son).   She agrees to work on reducing fluids during meals, chewing foods  more, and eating at regular intervals prior to surgery.   She is motivated to follow the bariatric diet  after surgery. From a nutrition standpoint, she is ready to proceed with the bariatric surgery program.    Plan:  Patient commits to returning for 6 monthly supervised weight loss visits prior to surgery.   She will plan to return for post-op RD visits beginning 2 weeks after surgery.

## 2020-06-03 ENCOUNTER — Encounter: Payer: Self-pay | Admitting: Dietician

## 2020-06-03 ENCOUNTER — Encounter: Payer: Medicare PPO | Admitting: Dietician

## 2020-06-03 ENCOUNTER — Other Ambulatory Visit: Payer: Self-pay

## 2020-06-03 VITALS — Ht 64.5 in | Wt 261.7 lb

## 2020-06-03 DIAGNOSIS — Z713 Dietary counseling and surveillance: Secondary | ICD-10-CM | POA: Diagnosis not present

## 2020-06-03 NOTE — Progress Notes (Signed)
Supervised Weight Loss for Bariatric Surgery Appt start time: 1330 end time:  1345.  SWL Appointment #1 of 6   Anthropometrics Start Weight at [facility]: 262.6lbs  (06/01/20) Weight: 261.7lbs Height: 5'4.5"  BMI: 44.23  Clinical Medications: no changes since previous visit Health/ medical history changes: no changes since previous visit  Dietary/ Lifestyle Progress:  Has begun reducing water intake immediately before and during meals.   Reports feeling heavy after eating her one daily meal.    Dietary intake: Eating Pattern: 1 meals and 0-1snacks daily Dining out:   Breakfast: coffee with sugar substitute  Snack: none  Lunch: 4pm steak and cheese quesadilla with guac, lettuce, refried beans, rice (small portions) Snack: none Dinner: none yesterday, not hungry Snack: none Beverages: water, 1 c coffee in am  Usual physical activity: limited due to back pain and scar tissue in lung(s)    Intervention:    Nutrition counseling for weight loss prior to upcoming bariatric surgery.  Discussed reasons for the need to avoid mealtime beverages after surgery.   Reviewed benefits of eating at regular intervals.                Nutritional Diagnosis:  [place]-3.3 Overweight/obesity related to history of excess calories and inadequate physical activity as evidenced by patient with current BMI of 44.23, working on dietary changes for weight loss and adequate nutrition prior to bariatric surgery.               Teaching Method Utilized:  Auditory   Learning Readiness:   Change in progress  Barriers to learning/adherence to lifestyle change: none  Demonstrated degree of understanding via:  Teach Back   Plan:  follow up 07/06/20 at 1:30pm.

## 2020-07-06 ENCOUNTER — Other Ambulatory Visit: Payer: Self-pay

## 2020-07-06 ENCOUNTER — Encounter: Payer: Self-pay | Admitting: Dietician

## 2020-07-06 ENCOUNTER — Encounter: Payer: Medicare PPO | Attending: Surgery | Admitting: Dietician

## 2020-07-06 VITALS — Ht 64.5 in | Wt 256.2 lb

## 2020-07-06 DIAGNOSIS — E119 Type 2 diabetes mellitus without complications: Secondary | ICD-10-CM | POA: Diagnosis not present

## 2020-07-06 DIAGNOSIS — Z6841 Body Mass Index (BMI) 40.0 and over, adult: Secondary | ICD-10-CM | POA: Diagnosis present

## 2020-07-06 DIAGNOSIS — Z713 Dietary counseling and surveillance: Secondary | ICD-10-CM | POA: Insufficient documentation

## 2020-07-06 NOTE — Patient Instructions (Signed)
·   Keep working to gradually eat more often during the day, ideally every 3-5 hours -- start with consistently eating at least 2 times a day; can be a light meal or snack rather than large/ cooked meals   Emphasize lean protein foods and low-carb veggies, keep starchy foods to controlled portions, between 1/2 cup to 3/4 cup ideally.

## 2020-07-06 NOTE — Progress Notes (Signed)
Supervised Weight Loss for Bariatric Surgery Appt start time: 1330 end time:  1400.  SWL Appointment 2 of 6   Anthropometrics Start Weight at [facility]: 261.7lbs  (06/03/20) Weight: 256.2lbs Height: 5'4.5"  BMI: 43.3  Clinical Medications: reconciled list in record; no changes since previous visit Health/ medical history changes: increased personal stress due to son's health  Dietary/ Lifestyle Progress:  Patient reports she has reduced fluid intake before and during meals.   She has been ill with GI virus for past 2 days, now feeling better.   Son has had 2 hospitalizations in the past several weeks, due to congestive heart failure, patient has been with him most of the time. She provides some meals for him; he also has Type 2 diabetes.    Dietary intake: Eating Pattern: 1-22meals and 0snacks daily Dining out: increased recently  Breakfast: occasional pkg crackers or ham biscuit about 2x in past month. On way to hospital  Snack: none  Lunch: none Snack: none Dinner: 5-6pm -- 11/21 chicken, green beans, baked potato; steak, salad, baked potato; more fast food in past 2 weeks due to son's hospitalization  Snack: none Beverages: 1 c coffee in am, water  Usual physical activity: limited due to back pain, scar tissue in lungs, and recently caring for son    Intervention:    Nutrition counseling for weight loss prior to upcoming bariatric surgery.  Discussed nutritional balance for meals and used plate method to instruct on basic meal planning. Advised small portions of starchy foods and emphasis on lean protein foods and low carb vegetables.   Discussed some restaurant options for balanced meals.   Reviewed goals for daily frequency of meals/ snacks.                Nutritional Diagnosis:  [place]-3.3 Overweight/obesity related to history of excess calories and inadequate physical activity as evidenced by patient with current BMI of 43.3, following dietary guidelines for weight loss  prior to bariatric surgery.               Teaching Method Utilized:  Visual Auditory Hands on  Materials provided:  Plate planner with food lists, basic meal pattern  Sample menus  Visit summary with goals/ instructions  Learning Readiness:   Change in progress  Barriers to learning/adherence to lifestyle change: none  Demonstrated degree of understanding via:  Teach Back   Plan:  08/05/20 at 1:30pm.

## 2020-07-12 ENCOUNTER — Other Ambulatory Visit: Payer: Self-pay | Admitting: Physician Assistant

## 2020-07-12 DIAGNOSIS — M546 Pain in thoracic spine: Secondary | ICD-10-CM

## 2020-07-12 NOTE — Telephone Encounter (Signed)
Requested Prescriptions  Pending Prescriptions Disp Refills  . ibuprofen (ADVIL) 600 MG tablet [Pharmacy Med Name: IBUPROFEN 600MG  TABLETS] 90 tablet 1    Sig: TAKE 1 TABLET(600 MG) BY MOUTH EVERY 8 HOURS AS NEEDED     Analgesics:  NSAIDS Passed - 07/12/2020  3:31 PM      Passed - Cr in normal range and within 360 days    Creatinine  Date Value Ref Range Status  07/14/2014 0.96 0.60 - 1.30 mg/dL Final   Creatinine, Ser  Date Value Ref Range Status  05/21/2020 0.76 0.44 - 1.00 mg/dL Final         Passed - HGB in normal range and within 360 days    Hemoglobin  Date Value Ref Range Status  05/21/2020 13.5 12.0 - 15.0 g/dL Final  02/13/2020 13.5 11.1 - 15.9 g/dL Final         Passed - Patient is not pregnant      Passed - Valid encounter within last 12 months    Recent Outpatient Visits          4 months ago Annual physical exam   [place]   5 months ago Mucopurulent chronic bronchitis [facility])   [place], [place], [place]   10 months ago [place], [place]   11 months ago Pneumothorax after biopsy   [facility] [place], [place]   12 months ago Eczema of both external [place], [place], [place]

## 2020-07-23 ENCOUNTER — Ambulatory Visit: Payer: Medicare PPO | Attending: Specialist

## 2020-07-23 DIAGNOSIS — G4761 Periodic limb movement disorder: Secondary | ICD-10-CM | POA: Insufficient documentation

## 2020-07-23 DIAGNOSIS — G4733 Obstructive sleep apnea (adult) (pediatric): Secondary | ICD-10-CM | POA: Insufficient documentation

## 2020-07-24 ENCOUNTER — Other Ambulatory Visit: Payer: Self-pay

## 2020-07-29 ENCOUNTER — Ambulatory Visit: Payer: Medicare PPO | Admitting: Internal Medicine

## 2020-08-05 ENCOUNTER — Other Ambulatory Visit: Payer: Self-pay

## 2020-08-05 ENCOUNTER — Encounter: Payer: Self-pay | Admitting: Dietician

## 2020-08-05 ENCOUNTER — Encounter: Payer: Medicare PPO | Attending: Surgery | Admitting: Dietician

## 2020-08-05 VITALS — Ht 64.5 in | Wt 262.6 lb

## 2020-08-05 DIAGNOSIS — Z6841 Body Mass Index (BMI) 40.0 and over, adult: Secondary | ICD-10-CM | POA: Insufficient documentation

## 2020-08-05 NOTE — Patient Instructions (Signed)
·   Start looking for suitable options for protein shakes to have after surgery and for pre-op diet. Can mix protein powder into broth, strained soup, even coffee.   Plan to have something to eat at least 3 times a day, maybe start with mid-late morning, then an afternoon meal, then a light meal like a sandwich or salad in the evenings.  A snack that contains at least 2 food groups can count as a meal -- protein like nuts or cheese or peanut butter + fruit or whole grain crackers or low fat popcorn.

## 2020-08-05 NOTE — Progress Notes (Signed)
Supervised Weight Loss for Bariatric Surgery Appt start time: 1330 end time:  1415.  SWL Appointment 3 of 6   Anthropometrics Start Weight at [facility]: 262.6lbs  (06/01/20) Weight: 262.6lbs Height: 5'4.5"   BMI: 44.38  Clinical Medications: reconciled list in medical record; inhaler has been added. Health/ medical history changes: no changes  Dietary/ Lifestyle Progress: . Patient reports recent sleep study, no results yet. . She has been eating more takeout food recently, taking son to multiple MD appts; which has led to some weight gain. . Does not eat sweets or drink sugar sweetened drinks.  . Breathing capacity is limited, has oxygen to use when doing household chores or walking for any distance. Does not use at night per MD guidance.   Dietary intake: Eating Pattern: 64meals and 0-2 snacks daily Dining out: several  Breakfast: usually none; drinks 1cup coffee with splenda  Snack: none  Lunch: often 3-4pm -- meat + veg + starch (potato); sandwich and fries from fast food; 2 hot dogs and fries; likes salmon broccoli and/or salad, baked potato Snack: none Dinner: often none; sometimes hot dog (no bun); 1/2 cheese sandwich Snack: sometimes snack size bag chips, trying to decrease Beverages: water, coffee in am  Usual physical activity: limited due to back pain, scar tissue in lungs    Intervention:   . Nutrition counseling for weight loss prior to upcoming bariatric surgery.               Nutritional Diagnosis:  [place]-3.3 Overweight/obesity related to history of excess calories and inadequate physical activity as evidenced by patient with current BMI of 44.38, following dietary guidelines for weight loss prior to bariatric surgery.               Teaching Method Utilized:  Visual Auditory   Learning Readiness:   Change in progress  Barriers to learning/adherence to lifestyle change: none  Demonstrated degree of understanding via:  Teach Back   Plan:  return on 09/03/19  at 1:30pm.

## 2020-08-10 ENCOUNTER — Ambulatory Visit: Payer: Medicare PPO | Attending: Internal Medicine

## 2020-08-10 DIAGNOSIS — Z23 Encounter for immunization: Secondary | ICD-10-CM

## 2020-08-10 NOTE — Progress Notes (Signed)
   Covid-19 Vaccination Clinic  Name:  Michelle Pitts    MRN: [id number] DOB: 27-Mar-1951  08/10/2020  Michelle Pitts was observed post Covid-19 immunization for 30 minutes based on pre-vaccination screening without incident. She was provided with Vaccine Information Sheet and instruction to access the V-Safe system.   Michelle Pitts was instructed to call 911 with any severe reactions post vaccine: Marland Kitchen Difficulty breathing  . Swelling of face and throat  . A fast heartbeat  . A bad rash all over body  . Dizziness and weakness   Immunizations Administered    Name Date Dose VIS Date Route   Pfizer COVID-19 Vaccine 08/10/2020  2:50 PM 0.3 mL 06/03/2020 Intramuscular   Manufacturer: [place]   Lot: [id number]   [place]: [id number]

## 2020-08-27 ENCOUNTER — Telehealth: Payer: Self-pay | Admitting: *Deleted

## 2020-08-27 NOTE — Telephone Encounter (Signed)
Left vm for patient to see if she would be willing to change her apts on 09/01/20 to 04/28/21 (coordinated with apts with Dr. Baruch Gouty).

## 2020-08-27 NOTE — Telephone Encounter (Signed)
Patient returned my phone call. She states that she was informed by Dr. Massie Maroon and by Dr. Bary Castilla that the apt with Dr. B is medically necessary to determine why she has rib fractures. Pt states that last apt with Dr. B that she had to be rushed to the ER and never had follow-up with Dr. Rogue Bussing. Patient denies any falls to contribute to her rib fractures.  Pt states that she doesn't want to really follow-up with Dr. Massie Maroon anymore and is willing to follow-up with Dr. Rogue Bussing. She is ok with not having an apt at this time.  Please advise if you still want patient to wait until Sept to be evaluated or if you still want to see her next week.   Hayley. Can you coordinate this discussion with the providers and contact patient back with the plan of care.

## 2020-08-28 NOTE — Telephone Encounter (Signed)
Dr. Jacinto Reap - Please advise

## 2020-08-28 NOTE — Telephone Encounter (Signed)
I don't mind helping coordinate appts for this patient if it was to coordinate for treatment and/or remove any barriers to treatment but since her follow up appts only need to be adjusted based on Dr. Sharmaine Base recommendation then I would say that this would be better served with scheduling and not navigation.

## 2020-09-01 ENCOUNTER — Inpatient Hospital Stay: Payer: Medicare PPO | Admitting: Internal Medicine

## 2020-09-01 ENCOUNTER — Encounter: Payer: Self-pay | Admitting: Physician Assistant

## 2020-09-01 DIAGNOSIS — R200 Anesthesia of skin: Secondary | ICD-10-CM

## 2020-09-01 MED ORDER — GABAPENTIN 300 MG PO CAPS: 300.0000 mg | ORAL_CAPSULE | Freq: Two times a day (BID) | ORAL | 1 refills | Status: DC | PRN

## 2020-09-02 ENCOUNTER — Encounter: Payer: Self-pay | Admitting: Dietician

## 2020-09-02 ENCOUNTER — Other Ambulatory Visit: Payer: Self-pay

## 2020-09-02 ENCOUNTER — Encounter: Payer: Medicare PPO | Attending: Surgery | Admitting: Dietician

## 2020-09-02 DIAGNOSIS — Z6841 Body Mass Index (BMI) 40.0 and over, adult: Secondary | ICD-10-CM | POA: Insufficient documentation

## 2020-09-02 NOTE — Patient Instructions (Signed)
   Start looking for suitable options for protein shakes and protein powder (unflavored) to have after surgery and for pre-op diet. Can mix protein powder into broth, strained soup, even coffee.   Plan to have something to eat at least 3 times a day, every 3-5 hours while awake. Maybe start with mid-late morning, then an afternoon meal, then a light meal like a sandwich or salad in the evenings.  A snack that contains at least 2 food groups can count as a meal -- protein like nuts or cheese or peanut butter + fruit or whole grain crackers or low fat popcorn.

## 2020-09-02 NOTE — Progress Notes (Signed)
Supervised Weight Loss for Bariatric Surgery Appt start time: 1330 end time:  1400.  SWL Appointment 4 of 6   Anthropometrics Start Weight at [facility]: 262.6lbs  (06/01/20) Weight: 259.7lbs Height: 5'4.5"  BMI: 43.89  Clinical Medications: reconciled list in medical record Health/ medical history changes: no changes per patient  Dietary/ Lifestyle Progress: . Patient had follow-up from sleep test, any treatment deferred until after bariatric surgery, if still needed.  Mindi Curling MD appointments have decreased, patient is cooking more; she is preparing meals to take to her son also. Marland Kitchen She has not needed supplemental oxygen recently, although physical activity has been low recently. . She reports dislike for sweet tasting foods and beverages, so finding suitable protein drinks is a challenge.  . She continues to struggle to increase frequency of eating during the day, due to lack of hunger.   Dietary intake: Eating Pattern: 1-2 meals and 0-1snacks daily Dining out:   Breakfast: coffee with splenda; tried slimfast shake once; breakfast pizza 2 times  Snack: none  Lunch: 3-4pm if no breakfast; meat + veg + starch sometimes salmon once a week; pork chop with broccoli casserole or squash casserole (no starch); green beans; chicken +mashed potatoes + salad Snack: none or small amount chips Dinner: hot dog with 1/2 slice cheese and no bread; sometimes skips Snack: snack bag of chips, or "balanced breaks" pack with cheese, nuts, dried fruit Beverages: coffee, water  Usual physical activity: limited, none in past week    Intervention:   . Nutrition counseling for weight loss prior to upcoming bariatric surgery. . Reviewed post-op protein and fluid goals and need for several meals and snacks daily to ensure adequate nutrition. . Discussed options for increasing frequency of meals and snacks; discussed challenge of consuming adequate nutrients with 1-2 meals daily. . Reviewed options for protein  supplements especially for savory vs sweet flavors. Encouraged patient to try adding unflavored protein powder into broth or soup. . Patient will continue to work on nutrition goals established at the previous visit.               Nutritional Diagnosis:  [place]-3.3 Overweight/obesity related to history of excess calories and inadequate physical activity as evidenced by patient with current BMI of 43.89, following dietary guidelines for weight loss prior to bariatric surgery.               Teaching Method Utilized:  Animator provided:  Visit summary with goals/ instructions  Learning Readiness:   Change in progress  Barriers to learning/adherence to lifestyle change: none  Demonstrated degree of understanding via:  Teach Back   Plan:  09/30/20 at 1:30pm.

## 2020-09-30 ENCOUNTER — Encounter: Payer: Self-pay | Admitting: Dietician

## 2020-09-30 ENCOUNTER — Other Ambulatory Visit: Payer: Self-pay

## 2020-09-30 ENCOUNTER — Encounter: Payer: Medicare PPO | Attending: Surgery | Admitting: Dietician

## 2020-09-30 VITALS — Ht 64.5 in | Wt 261.6 lb

## 2020-09-30 DIAGNOSIS — Z6841 Body Mass Index (BMI) 40.0 and over, adult: Secondary | ICD-10-CM | POA: Diagnosis present

## 2020-09-30 NOTE — Patient Instructions (Signed)
   Test out smoothie recipe combinations to find some that work well.   Look for unflavored protein powder to add to smoothies or broth/ soup.  Continue to plenty of vegetables and fruits, lean protein foods, and limit portions or starchy foods.

## 2020-09-30 NOTE — Progress Notes (Signed)
Supervised Weight Loss for Bariatric Surgery Appt start time: 1330 end time:  1400.  SWL Appointment 5 of 6   Anthropometrics Start Weight at [facility]: 262.6lbs  (06/01/20) Weight: 261.6lbs Height: 5'4.5"   BMI: 44.21  Clinical Medications: reconciled list in medical record Health/ medical history changes: no changes per patient  Dietary/ Lifestyle Progress: . Patient reports having a small amount of food at breakfast more often, and has recently begun using smaller, portioned plates to reduce overall food portions. . She voices concern about loose skin after weight loss; advised her to discuss options with bariatric MD. . She reports psychologic appts scheduled for 09/2020 and 10/2020. Marland Kitchen She continues to cook meals for her son; doing some batch cooking of meats and varying sides from day to day.  . Exercise remains limited due to back issues/ pain.   Dietary intake: Eating Pattern: 2-3 meals and 0-1snacks daily Dining out: 2-3 times a week  Breakfast: coffee with splenda; occasionally eggs, today ate deviled eggs (2 halves)  Snack: none  Lunch: 3-4pm occ as late as 5pm. 09/29/20 pork chop + rice and broccoli; fish 2-3x a week + veg + starch Snack: none Dinner: "balance break" pack with [place] cheese, nuts, dried fruit 180kcal Snack: none; working to reduce frequency of eating chips and keeps to [employer] or less Beverages: coffee, water 2-3 bottles daily, 1 beer  Usual physical activity: none recently -- ADLs    Intervention:   . Nutrition counseling for weight loss prior to upcoming bariatric surgery. . Reviewed and further discussed protein shake/ meal replacement options to avoid sweet taste per patient preference. . Discussed timeline for pre-op diet and liquid diet post-op.  . Discussed points for thought prior to surgery to ensure adequate mental and emotional preparation. She will also discuss with psychology professional.                Nutritional Diagnosis:  [place]-3.3  Overweight/obesity related to history of excess calories and inadequate physical activity as evidenced by patient with current BMI of 44.7, following dietary guidelines for weight loss prior to bariatric surgery.               Teaching Method Utilized:  Visual Auditory Hands on  Materials provided:  Ready or Not (AND)  Low-carb smoothie recipe sheet  Visit summary with goals/ instructions  Learning Readiness:   Change in progress  Barriers to learning/adherence to lifestyle change: none  Demonstrated degree of understanding via:  Teach Back   Plan:  10/28/20 at 1:30pm.

## 2020-10-03 ENCOUNTER — Ambulatory Visit: Payer: Self-pay

## 2020-10-07 ENCOUNTER — Ambulatory Visit: Payer: Medicare PPO | Admitting: Psychology

## 2020-10-08 ENCOUNTER — Encounter: Payer: Self-pay | Admitting: Physician Assistant

## 2020-10-08 ENCOUNTER — Ambulatory Visit
Admission: RE | Admit: 2020-10-08 | Discharge: 2020-10-08 | Disposition: A | Discharge status: Home / Self Care | Payer: Medicare PPO | Source: Ambulatory Visit | Attending: Physician Assistant | Admitting: Physician Assistant

## 2020-10-08 ENCOUNTER — Ambulatory Visit (INDEPENDENT_AMBULATORY_CARE_PROVIDER_SITE_OTHER): Payer: Medicare PPO | Admitting: Physician Assistant

## 2020-10-08 ENCOUNTER — Other Ambulatory Visit: Payer: Self-pay

## 2020-10-08 ENCOUNTER — Ambulatory Visit
Admission: RE | Admit: 2020-10-08 | Discharge: 2020-10-08 | Disposition: A | Discharge status: Home / Self Care | Payer: Medicare PPO | Attending: Physician Assistant | Admitting: Physician Assistant

## 2020-10-08 VITALS — BP 143/78 | HR 95 | Temp 98.7°F | Resp 20 | Wt 264.9 lb

## 2020-10-08 DIAGNOSIS — J3089 Other allergic rhinitis: Secondary | ICD-10-CM

## 2020-10-08 DIAGNOSIS — M8560 Other cyst of bone, unspecified site: Secondary | ICD-10-CM | POA: Diagnosis present

## 2020-10-08 DIAGNOSIS — I700 Atherosclerosis of aorta: Secondary | ICD-10-CM

## 2020-10-08 DIAGNOSIS — R739 Hyperglycemia, unspecified: Secondary | ICD-10-CM

## 2020-10-08 DIAGNOSIS — E039 Hypothyroidism, unspecified: Secondary | ICD-10-CM | POA: Diagnosis not present

## 2020-10-08 DIAGNOSIS — I10 Essential (primary) hypertension: Secondary | ICD-10-CM

## 2020-10-08 DIAGNOSIS — R748 Abnormal levels of other serum enzymes: Secondary | ICD-10-CM

## 2020-10-08 DIAGNOSIS — F419 Anxiety disorder, unspecified: Secondary | ICD-10-CM

## 2020-10-08 DIAGNOSIS — J0140 Acute pansinusitis, unspecified: Secondary | ICD-10-CM

## 2020-10-08 DIAGNOSIS — E7800 Pure hypercholesterolemia, unspecified: Secondary | ICD-10-CM | POA: Diagnosis not present

## 2020-10-08 MED ORDER — AMOXICILLIN-POT CLAVULANATE 875-125 MG PO TABS: 1.0000 | ORAL_TABLET | Freq: Two times a day (BID) | ORAL | 0 refills | Status: DC

## 2020-10-08 MED ORDER — FLUTICASONE PROPIONATE 50 MCG/ACT NA SUSP: 2.0000 | Freq: Every day | NASAL | 6 refills | Status: DC

## 2020-10-08 MED ORDER — ALPRAZOLAM 0.5 MG PO TABS: 0.5000 mg | ORAL_TABLET | Freq: Every evening | ORAL | 5 refills | Status: AC | PRN

## 2020-10-08 NOTE — Progress Notes (Signed)
Established patient visit   Patient: Michelle Pitts   DOB: August 05, 1951   70 y.o. Female  MRN: [id number] Visit Date: 10/08/2020  Today's healthcare provider: Mar Daring, PA-C   Chief Complaint  Patient presents with  . Stress   Subjective    HPI  Stress: Patient complains of increased stress. She reports that her sons health is declining and she is worried for him.  Sinus drainage: Patient complains of intermittent post nasal drainage for the past 2 months. She states she constantly has to clear her throat. She denies any fever or sore throat. She reports her baseline SOB is unchanged.  Patient Active Problem List   Diagnosis Date Noted  . S/P bilateral oophorectomy 06/27/2018  . Aortic atherosclerosis ([place]) 06/21/2018  . Malignant neoplasm of lung ([place]) 06/21/2018  . Abnormal EKG 06/21/2018  . Coronary artery calcification seen on CT scan 06/21/2018  . Pneumothorax on left 04/18/2018  . 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 Pneumothorax 04/13/2018  . Pneumothorax after biopsy 04/12/2018  . Lung nodule 03/19/2018  . Bilateral leg numbness 11/10/2017  . Obesity, Class III, BMI 40-49.9 (morbid obesity) ([place]) 11/10/2017  . DNR no code (do not resuscitate) 02/08/2016  . Personal history of tobacco use, presenting hazards to health 11/18/2015  . Shortness of breath 07/24/2015  . Malignant neoplasm of lower-inner quadrant of left female breast ([place]) 07/22/2015  . Tachycardia 07/22/2015  . Anxiety 05/15/2015  . Colon polyp 05/15/2015  . CAFL (chronic airflow limitation) ([place]) 05/15/2015  . Elevated blood sugar 05/15/2015  . Abnormal liver enzymes 05/15/2015  . Family history of diabetes mellitus 05/15/2015  . BP (high blood pressure) 05/15/2015  . Cannot sleep 05/15/2015  . Apnea, sleep 05/15/2015  . Avitaminosis D 05/15/2015  . 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 Anxiety disorder 05/15/2015  . Chronic obstructive  pulmonary disease ([place]) 05/15/2015  . Gastro-esophageal reflux disease without esophagitis 05/15/2015  . 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 Adult hypothyroidism 05/15/2015  . Pure hypercholesterolemia 05/15/2015  . Tobacco abuse, in remission 03/09/2015   Past Medical History:  Diagnosis Date  . Anxiety   . Breast cancer ([place]) 01/23/2014   Left breast, T1c, N0, M0. ER/PR +; Her 2 neu not overexpressed. Oncotype DX: 13,Low risk.9% over 10 years.   . Cancer ([place]) 1995   vulvar, [facility]  . COPD (chronic obstructive pulmonary disease) ([facility])    bronchitis  . Dysrhythmia   . GERD (gastroesophageal reflux disease)   . History of colon polyps   . Hypercholesterolemia   . Hypertension   . Hypothyroidism   . Insomnia   . Lung cancer ([place])   . 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 Personal history of radiation therapy   . Personal history of tobacco use, presenting hazards to health 11/18/2015  . Sleep apnea   . Sleep apnea   . SOB (shortness of breath) on exertion   . Spinal stenosis   . Thyroid disease   . Vitamin D deficiency        Medications: Outpatient Medications Prior to Visit  Medication Sig  . albuterol (VENTOLIN HFA) 108 (90 Base) MCG/ACT inhaler Inhale into the lungs.  . Cholecalciferol (VITAMIN D3) 50 MCG (2000 UT) TABS Take by mouth.  . ezetimibe (ZETIA) 10 MG tablet Take 1 tablet (10 mg total) by mouth daily.  . Fluticasone-Umeclidin-Vilant (TRELEGY ELLIPTA) 100-62.5-25 MCG/INH AEPB Inhale into the lungs.  Marland Kitchen  gabapentin (NEURONTIN) 300 MG capsule Take 1 capsule (300 mg total) by mouth 2 (two) times daily as needed. For pain  . ibuprofen (ADVIL) 600 MG tablet TAKE 1 TABLET(600 MG) BY MOUTH EVERY 8 HOURS AS NEEDED  . metoprolol succinate (TOPROL-XL) 25 MG 24 hr tablet Take 1 tablet (25 mg total) by mouth daily.   No facility-administered medications prior to visit.    Review of Systems  Constitutional: Negative for appetite change, chills, fatigue and fever.   HENT: Positive for congestion, postnasal drip and sinus pressure. Negative for rhinorrhea and sinus pain.   Respiratory: Negative for chest tightness and shortness of breath.   Cardiovascular: Negative for chest pain and palpitations.  Gastrointestinal: Negative for abdominal pain, nausea and vomiting.  Musculoskeletal: Positive for back pain (mid thoracic pain ).  Neurological: Negative for dizziness and weakness.  Psychiatric/Behavioral: The patient is nervous/anxious.        Increased stress    Last CBC Lab Results  Component Value Date   WBC 5.7 10/08/2020   HGB 13.0 10/08/2020   HCT 38.7 10/08/2020   MCV 94 10/08/2020   MCH 31.4 10/08/2020   RDW 12.3 10/08/2020   PLT 236 12/75/1700   Last metabolic panel Lab Results  Component Value Date   GLUCOSE 96 10/08/2020   NA 143 10/08/2020   K 5.0 10/08/2020   CL 106 10/08/2020   CO2 23 10/08/2020   BUN 24 10/08/2020   CREATININE 1.25 (H) 10/08/2020   GFRNONAA 43 (L) 10/08/2020   GFRAA 50 (L) 10/08/2020   CALCIUM 9.7 10/08/2020   PROT 6.7 10/08/2020   ALBUMIN 4.5 10/08/2020   LABGLOB 2.2 10/08/2020   AGRATIO 2.0 10/08/2020   BILITOT 0.3 10/08/2020   ALKPHOS 164 (H) 10/08/2020   AST 30 10/08/2020   ALT 44 (H) 10/08/2020   ANIONGAP 11 05/21/2020   Last lipids Lab Results  Component Value Date   CHOL 214 (H) 10/08/2020   HDL 73 10/08/2020   LDLCALC 110 (H) 10/08/2020   TRIG 180 (H) 10/08/2020   CHOLHDL 2.9 05/21/2020       Objective    BP (!) 143/78 (BP Location: Right Arm, Patient Position: Sitting, Cuff Size: Large)   Pulse 95   Temp 98.7 F (37.1 C) (Oral)   Resp 20   Wt 264 lb 14.4 oz (120.2 kg)   SpO2 96% Comment: room air  BMI 44.77 kg/m  BP Readings from Last 3 Encounters:  10/08/20 (!) 143/78  04/29/20 (!) 162/70  04/21/20 140/80   Wt Readings from Last 3 Encounters:  10/08/20 264 lb 14.4 oz (120.2 kg)  09/30/20 261 lb 9.6 oz (118.7 kg)  09/02/20 259 lb 11.2 oz (117.8 kg)       Physical  Exam Vitals reviewed.  Constitutional:      General: She is not in acute distress.    Appearance: Normal appearance. She is well-developed and well-nourished. She is obese. She is not ill-appearing or diaphoretic.  HENT:     Head: Normocephalic and atraumatic.     Right Ear: Hearing, tympanic membrane, ear canal and external ear normal.     Left Ear: Hearing, tympanic membrane, ear canal and external ear normal.     Nose: Congestion present.     Mouth/Throat:     Mouth: Oropharynx is clear and moist and mucous membranes are normal. Mucous membranes are moist.     Pharynx: Oropharynx is clear. Uvula midline. No oropharyngeal exudate or posterior oropharyngeal erythema.  Eyes:  General: No scleral icterus.       Right eye: No discharge.        Left eye: No discharge.     Extraocular Movements: Extraocular movements intact.     Conjunctiva/sclera: Conjunctivae normal.     Pupils: Pupils are equal, round, and reactive to light.  Neck:     Thyroid: No thyromegaly.     Trachea: No tracheal deviation.  Cardiovascular:     Rate and Rhythm: Normal rate and regular rhythm.     Pulses: Normal pulses.     Heart sounds: Normal heart sounds. No murmur heard. No friction rub. No gallop.   Pulmonary:     Effort: Pulmonary effort is normal. No respiratory distress.     Breath sounds: Normal breath sounds. No stridor. No wheezing or rales.  Musculoskeletal:     Cervical back: Normal range of motion and neck supple. No tenderness.  Lymphadenopathy:     Cervical: No cervical adenopathy.  Skin:    General: Skin is warm and dry.  Neurological:     General: No focal deficit present.     Mental Status: She is alert. Mental status is at baseline.  Psychiatric:        Mood and Affect: Mood normal.        Thought Content: Thought content normal.      No results found for any visits on 10/08/20.  Assessment & Plan     1. Primary hypertension Stable. Continue current treatment plan. Will  check labs as below and f/u pending results. - CBC w/Diff/Platelet - Comprehensive Metabolic Panel (CMET) - TSH - Lipid Panel With LDL/HDL Ratio - HgB A1c  2. Aortic atherosclerosis ([facility]) Stable. Will check labs as below and f/u pending results. - CBC w/Diff/Platelet - Comprehensive Metabolic Panel (CMET) - TSH - Lipid Panel With LDL/HDL Ratio - HgB A1c  3. Adult hypothyroidism Stable. Continue current medical treatment plan. Will check labs as below and f/u pending results. - CBC w/Diff/Platelet - Comprehensive Metabolic Panel (CMET) - TSH  4. Pure hypercholesterolemia Stable. Continue current medical treatment plan. Will check labs as below and f/u pending results. - CBC w/Diff/Platelet - Comprehensive Metabolic Panel (CMET) - TSH - Lipid Panel With LDL/HDL Ratio - HgB A1c  5. Elevated blood sugar Diet controlled. Will check labs as below and f/u pending results. - CBC w/Diff/Platelet - Comprehensive Metabolic Panel (CMET) - TSH - Lipid Panel With LDL/HDL Ratio - HgB A1c  6. Anxiety Stable. Diagnosis pulled for medication refill. Continue current medical treatment plan. - ALPRAZolam (XANAX) 0.5 MG tablet; Take 1 tablet (0.5 mg total) by mouth at bedtime as needed for anxiety.  Dispense: 30 tablet; Refill: 5  7. Abnormal liver enzymes Diet controlled. Will check labs as below and f/u pending results. - CBC w/Diff/Platelet - Comprehensive Metabolic Panel (CMET) - TSH - Lipid Panel With LDL/HDL Ratio - HgB A1c  8. Bone cyst Noted on thoracic spine MRI with fractures of the left 6th and 7th ribs. Patient having worsening pain. Will repeat imaging as below. F/U pending results.  - DG Thoracic Spine W/Swimmers; Future - DG Ribs Unilateral Left; Future  9. Environmental and seasonal allergies Stable. Diagnosis pulled for medication refill. Continue current medical treatment plan. - fluticasone (FLONASE) 50 MCG/ACT nasal spray; Place 2 sprays into both nostrils  daily.  Dispense: 16 g; Refill: 6  10. Acute non-recurrent pansinusitis Worsening symptoms that have not responded to OTC medications. Will give augmentin as below. Continue allergy medications.  Stay well hydrated and get plenty of rest. Call if no symptom improvement or if symptoms worsen. - amoxicillin-clavulanate (AUGMENTIN) 875-125 MG tablet; Take 1 tablet by mouth 2 (two) times daily.  Dispense: 20 tablet; Refill: 0   No follow-ups on file.      Reynolds Bowl, PA-C, have reviewed all documentation for this visit. The documentation on 10/13/20 for the exam, diagnosis, procedures, and orders are all accurate and complete.   Rubye Beach  [facility] [phone] (phone) [phone] (fax)  [place]

## 2020-10-09 LAB — CBC WITH DIFFERENTIAL/PLATELET
Basophils Absolute: 0.1 10*3/uL (ref 0.0–0.2)
Basos: 1 %
EOS (ABSOLUTE): 0.1 10*3/uL (ref 0.0–0.4)
Eos: 1 %
Hematocrit: 38.7 % (ref 34.0–46.6)
Hemoglobin: 13 g/dL (ref 11.1–15.9)
Immature Grans (Abs): 0 10*3/uL (ref 0.0–0.1)
Immature Granulocytes: 0 %
Lymphocytes Absolute: 1.2 10*3/uL (ref 0.7–3.1)
Lymphs: 22 %
MCH: 31.4 pg (ref 26.6–33.0)
MCHC: 33.6 g/dL (ref 31.5–35.7)
MCV: 94 fL (ref 79–97)
Monocytes Absolute: 0.6 10*3/uL (ref 0.1–0.9)
Monocytes: 10 %
Neutrophils Absolute: 3.7 10*3/uL (ref 1.4–7.0)
Neutrophils: 66 %
Platelets: 236 10*3/uL (ref 150–450)
RBC: 4.14 x10E6/uL (ref 3.77–5.28)
RDW: 12.3 % (ref 11.7–15.4)
WBC: 5.7 10*3/uL (ref 3.4–10.8)

## 2020-10-09 LAB — LIPID PANEL WITH LDL/HDL RATIO
Cholesterol, Total: 214 mg/dL — ABNORMAL HIGH (ref 100–199)
HDL: 73 mg/dL (ref >39)
LDL Chol Calc (NIH): 110 mg/dL — ABNORMAL HIGH (ref 0–99)
LDL/HDL Ratio: 1.5 ratio (ref 0.0–3.2)
Triglycerides: 180 mg/dL — ABNORMAL HIGH (ref 0–149)
VLDL Cholesterol Cal: 31 mg/dL (ref 5–40)

## 2020-10-09 LAB — COMPREHENSIVE METABOLIC PANEL
ALT: 44 IU/L — ABNORMAL HIGH (ref 0–32)
AST: 30 IU/L (ref 0–40)
Albumin/Globulin Ratio: 2 (ref 1.2–2.2)
Albumin: 4.5 g/dL (ref 3.7–4.7)
Alkaline Phosphatase: 164 IU/L — ABNORMAL HIGH (ref 44–121)
BUN/Creatinine Ratio: 19 (ref 12–28)
BUN: 24 mg/dL (ref 8–27)
Bilirubin Total: 0.3 mg/dL (ref 0.0–1.2)
CO2: 23 mmol/L (ref 20–29)
Calcium: 9.7 mg/dL (ref 8.7–10.3)
Chloride: 106 mmol/L (ref 96–106)
Creatinine, Ser: 1.25 mg/dL — ABNORMAL HIGH (ref 0.57–1.00)
GFR calc Af Amer: 50 mL/min/{1.73_m2} — ABNORMAL LOW (ref >59)
GFR calc non Af Amer: 43 mL/min/{1.73_m2} — ABNORMAL LOW (ref >59)
Globulin, Total: 2.2 g/dL (ref 1.5–4.5)
Glucose: 96 mg/dL (ref 65–99)
Potassium: 5 mmol/L (ref 3.5–5.2)
Sodium: 143 mmol/L (ref 134–144)
Total Protein: 6.7 g/dL (ref 6.0–8.5)

## 2020-10-09 LAB — TSH: TSH: 3.16 u[IU]/mL (ref 0.450–4.500)

## 2020-10-09 LAB — HEMOGLOBIN A1C
Est. average glucose Bld gHb Est-mCnc: 114 mg/dL
Hgb A1c MFr Bld: 5.6 % (ref 4.8–5.6)

## 2020-10-11 ENCOUNTER — Encounter: Payer: Self-pay | Admitting: Physician Assistant

## 2020-10-11 DIAGNOSIS — S2232XG Fracture of one rib, left side, subsequent encounter for fracture with delayed healing: Secondary | ICD-10-CM

## 2020-10-13 ENCOUNTER — Encounter: Payer: Self-pay | Admitting: Physician Assistant

## 2020-10-21 ENCOUNTER — Ambulatory Visit (INDEPENDENT_AMBULATORY_CARE_PROVIDER_SITE_OTHER): Payer: Medicare PPO | Admitting: Psychology

## 2020-10-21 DIAGNOSIS — F509 Eating disorder, unspecified: Secondary | ICD-10-CM

## 2020-10-26 ENCOUNTER — Ambulatory Visit: Payer: Medicare PPO | Admitting: Psychology

## 2020-10-26 ENCOUNTER — Ambulatory Visit (INDEPENDENT_AMBULATORY_CARE_PROVIDER_SITE_OTHER): Payer: Medicare PPO | Admitting: Psychology

## 2020-10-26 DIAGNOSIS — F509 Eating disorder, unspecified: Secondary | ICD-10-CM

## 2020-10-28 ENCOUNTER — Encounter: Payer: Medicare PPO | Attending: Surgery | Admitting: Dietician

## 2020-10-28 ENCOUNTER — Encounter: Payer: Self-pay | Admitting: Dietician

## 2020-10-28 ENCOUNTER — Other Ambulatory Visit: Payer: Self-pay

## 2020-10-28 VITALS — Ht 64.5 in | Wt 266.0 lb

## 2020-10-28 DIAGNOSIS — Z79899 Other long term (current) drug therapy: Secondary | ICD-10-CM | POA: Diagnosis not present

## 2020-10-28 DIAGNOSIS — Z6841 Body Mass Index (BMI) 40.0 and over, adult: Secondary | ICD-10-CM | POA: Diagnosis present

## 2020-10-28 DIAGNOSIS — G4733 Obstructive sleep apnea (adult) (pediatric): Secondary | ICD-10-CM | POA: Diagnosis not present

## 2020-10-28 DIAGNOSIS — I10 Essential (primary) hypertension: Secondary | ICD-10-CM | POA: Diagnosis not present

## 2020-10-28 NOTE — Patient Instructions (Signed)
   Reduce portions of foods, especially starchy foods.   Eat at least a small meal or snack 3-4 times a day, every 3-5 hours. A simple, light meal can be fruit and nuts, or deli meat and cheese with raw veggies, 1 slice of bread and 1 slice of cheese.

## 2020-10-28 NOTE — Progress Notes (Signed)
Supervised Weight Loss for Bariatric Surgery Appt start time: 1330 end time:  1415.  SWL Appointment 6 of 6   Anthropometrics Start Weight at [facility]: 262.6lbs  (06/01/20) Weight: 266.0lbs Height: 5'4.5"    BMI: 44.95  Clinical Medications: no changes Health/ medical history changes: increased indigestion  Dietary/ Lifestyle Progress: . Patient states she has been eating her dinner later in the evenings, 6:30-7pm; sleep has been disrupted with indigestion/ heartburn . 2 rib fractures which occurred last year have not yet healed; sees orthopedist 3/21.  Marland Kitchen Has also had UTI in the past month. Pain has improved but she still feels her bladder is not completely emptying when urinating. . Patient feels weight gain of about 4.5lbs is due to less activity due to UTI and eating more calories later in the day    Dietary intake: Eating Pattern: 2 meals and 0-1snacks daily Dining out: 2-3 times a week  Breakfast: coffee with splenda; sometimes with food ie cheese toast; eggs; ate out once  Snack: none  Lunch: sometimes skips when eating breakfast Snack: none Dinner: 6-7pm recently fish 2-3 x a week with veg and starch; stir-fried shrimp steak and veggies (restaurant);  Snack: usually none Beverages: coffee, water  Usual physical activity: ADLs    Intervention:   . Nutrition counseling for weight loss prior to upcoming bariatric surgery. . Reviewed progress since previous visit; patient has evaluated possible causes of weight gain. She voices understanding of need to make changes in eating pattern and food choices before and after surgery . Discussed importance of gradual changes prior to surgery to ease transition to bariatric diet and improve post-op weight loss success. . Discussed pre-op diet purpose and overview . Reviewed basic food groups and basic balanced meal planning               Nutritional Diagnosis:  [place]-3.3 Overweight/obesity related to history of excess calories and  inadequate physical activity as evidenced by patient with current BMI of 44.95, working on  dietary changes for weight loss prior to bariatric surgery.               Teaching Method Utilized:  Visual Auditory Hands on  Materials provided:  Visit summary with goals/ instructions  Learning Readiness:   Change in progress  Barriers to learning/adherence to lifestyle change: none  Demonstrated degree of understanding via:  Teach Back   Plan:   Follow goals as listed in "patient instrutions"   schedule pre-op class at least 2 weeks prior to surgery.

## 2020-10-30 ENCOUNTER — Encounter: Payer: Self-pay | Admitting: Physician Assistant

## 2020-10-30 ENCOUNTER — Telehealth: Payer: Self-pay

## 2020-10-30 DIAGNOSIS — Z6841 Body Mass Index (BMI) 40.0 and over, adult: Secondary | ICD-10-CM

## 2020-10-30 NOTE — Telephone Encounter (Signed)
Copied from [place] [phone]. Topic: Referral - Status >> Oct 30, 2020  3:07 PM Yvette Rack wrote: Reason for CRM: April with [facility] requests updated referral for weight loss and bariatric surgery. April stated pt insurance has given a deadline of next Friday to have updated referral. Fax# [phone]  Cb# [phone]

## 2020-11-02 NOTE — Telephone Encounter (Signed)
I have not received any form yet but placed a new referral in system

## 2020-11-11 ENCOUNTER — Encounter: Payer: Self-pay | Admitting: Internal Medicine

## 2020-11-11 ENCOUNTER — Other Ambulatory Visit: Payer: Self-pay

## 2020-11-11 ENCOUNTER — Inpatient Hospital Stay: Payer: Medicare PPO | Attending: Internal Medicine | Admitting: Internal Medicine

## 2020-11-11 VITALS — BP 145/96 | HR 79 | Temp 97.5°F | Resp 16 | Ht 64.5 in | Wt 266.0 lb

## 2020-11-11 DIAGNOSIS — R911 Solitary pulmonary nodule: Secondary | ICD-10-CM

## 2020-11-11 DIAGNOSIS — E039 Hypothyroidism, unspecified: Secondary | ICD-10-CM | POA: Insufficient documentation

## 2020-11-11 DIAGNOSIS — E559 Vitamin D deficiency, unspecified: Secondary | ICD-10-CM | POA: Insufficient documentation

## 2020-11-11 DIAGNOSIS — G4730 Sleep apnea, unspecified: Secondary | ICD-10-CM | POA: Diagnosis not present

## 2020-11-11 DIAGNOSIS — I129 Hypertensive chronic kidney disease with stage 1 through stage 4 chronic kidney disease, or unspecified chronic kidney disease: Secondary | ICD-10-CM | POA: Insufficient documentation

## 2020-11-11 DIAGNOSIS — Z79899 Other long term (current) drug therapy: Secondary | ICD-10-CM | POA: Diagnosis not present

## 2020-11-11 DIAGNOSIS — Z86010 Personal history of colonic polyps: Secondary | ICD-10-CM | POA: Insufficient documentation

## 2020-11-11 DIAGNOSIS — Z85118 Personal history of other malignant neoplasm of bronchus and lung: Secondary | ICD-10-CM | POA: Insufficient documentation

## 2020-11-11 DIAGNOSIS — N183 Chronic kidney disease, stage 3 unspecified: Secondary | ICD-10-CM | POA: Insufficient documentation

## 2020-11-11 DIAGNOSIS — K219 Gastro-esophageal reflux disease without esophagitis: Secondary | ICD-10-CM | POA: Diagnosis not present

## 2020-11-11 DIAGNOSIS — Z853 Personal history of malignant neoplasm of breast: Secondary | ICD-10-CM | POA: Diagnosis not present

## 2020-11-11 DIAGNOSIS — J449 Chronic obstructive pulmonary disease, unspecified: Secondary | ICD-10-CM | POA: Insufficient documentation

## 2020-11-11 DIAGNOSIS — E7800 Pure hypercholesterolemia, unspecified: Secondary | ICD-10-CM | POA: Diagnosis not present

## 2020-11-11 DIAGNOSIS — C3431 Malignant neoplasm of lower lobe, right bronchus or lung: Secondary | ICD-10-CM | POA: Diagnosis present

## 2020-11-11 DIAGNOSIS — Z923 Personal history of irradiation: Secondary | ICD-10-CM | POA: Diagnosis not present

## 2020-11-11 DIAGNOSIS — Z809 Family history of malignant neoplasm, unspecified: Secondary | ICD-10-CM | POA: Insufficient documentation

## 2020-11-11 DIAGNOSIS — Z87891 Personal history of nicotine dependence: Secondary | ICD-10-CM | POA: Insufficient documentation

## 2020-11-11 DIAGNOSIS — C3432 Malignant neoplasm of lower lobe, left bronchus or lung: Secondary | ICD-10-CM

## 2020-11-11 NOTE — Assessment & Plan Note (Addendum)
#  Stage I non-small cell lung cancer [atypical cells on cytology] -s/p SBRT.  CT scan-September 2021-no evidence of recurrence.  #I would recommend continued surveillance with imaging in 6 months; patient will be due for a CT scan at this time.  Just been ordered.  # COPD-stable.  Patient not symptomatic..[Dr.Fleming]- not on trelegy/albutrol- defer to pulmonary.  # CKD-Stage III- GFR 43 [feb 2022]- STABLE>   # DISPOSITION: will call with results # CT scan chest in 1 week # follow up TBD- Dr.B  # 25 minutes face-to-face with the patient discussing the above plan of care; more than 50% of time spent on prognosis/ natural history; counseling and coordination.   Cc; hayley

## 2020-11-11 NOTE — Assessment & Plan Note (Deleted)
#   18 mm left lower lobe lung groundglass opacity incidentally noted PET scan shows no evidence of hypermetabolism.  Highly suspicious for slow-growing adenocarcinoma. S/p Bronch- atypical cells on cytology; no obvious malignancy noted however. Await eval with Dr.Crystal for possible role for radiation given high clinical suspicion for malignancy.  #Shortness of breath worsened-especially given the recent pneumothorax; I would recommend STAT CXR  # COPD-moderate to severe- stable/see above.  #Follow-up to be decided  Addendum: Chest x-ray done showed moderate-sized left pneumothorax; patient recommended to go to the emergency room.  Discussed with Dr. Genevive Bi; is to evaluate the patient in the floor.  Discussed with ER physician-plan is to placement/or IR.  #I independently reviewed the imaging-which shows moderate-sized left-sided pneumothorax.  Plan as above.

## 2020-11-11 NOTE — Progress Notes (Signed)
[place] NOTE  Patient Care Team: Rubye Beach as PCP - General (Family Medicine) Bary Castilla, Forest Gleason, MD (General Surgery) Noreene Filbert, MD as Referring Physician (Radiation Oncology) Pa, [place] (Optometry)  CHIEF COMPLAINTS/PURPOSE OF CONSULTATION:  Lung mass.  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  female history of COPD/prior history of smoking stage I non-small cell lung cancer status post SBRT is here for follow-up.  Patient denies any new shortness of breath or cough.  Patient continues to have intermittent pain in her right ribs/refracture not any worse.  She has been followed by orthopedics.  As per patient also had suggested stimulator.  Patient is in fact awaiting gastric sleeve surgery on April 25 for weight loss.   Review of Systems  Constitutional: Positive for malaise/fatigue. Negative for chills, diaphoresis, fever and weight loss.  HENT: Negative for nosebleeds and sore throat.   Eyes: Negative for double vision.  Respiratory: Positive for cough and shortness of breath. Negative for hemoptysis, sputum production and wheezing.   Cardiovascular: Negative for chest pain, palpitations, orthopnea and leg swelling.  Gastrointestinal: Negative for abdominal pain, blood in stool, constipation, diarrhea, heartburn, melena,  nausea and vomiting.  Genitourinary: Negative for dysuria, frequency and urgency.  Musculoskeletal: Positive for joint pain. Negative for back pain.  Skin: Negative.  Negative for itching and rash.  Neurological: Negative for dizziness, tingling, focal weakness, weakness and headaches.  Endo/Heme/Allergies: Does not bruise/bleed easily.  Psychiatric/Behavioral: Negative for depression. The patient is not nervous/anxious and does not have insomnia.      MEDICAL HISTORY:  Past Medical History:  Diagnosis Date  . Anxiety   . Breast cancer ([place]) 01/23/2014   Left breast, T1c, N0, M0. ER/PR +; Her 2 neu not overexpressed. Oncotype DX: 13,Low risk.9% over 10 years.   . Cancer ([place]) 1995   vulvar, [facility]  . COPD (chronic obstructive pulmonary disease) (HCC)    bronchitis  . Dysrhythmia   . GERD (gastroesophageal reflux disease)   . History of colon polyps   . Hypercholesterolemia   . Hypertension   . Hypothyroidism   . Insomnia   . Lung cancer ([place])   . 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 Personal history of radiation therapy   . Personal history of tobacco use, presenting hazards to health 11/18/2015  . Sleep apnea   . Sleep apnea   . SOB (shortness of breath) on exertion   . Spinal stenosis   . Thyroid disease   . Vitamin D deficiency     SURGICAL HISTORY: Past Surgical History:  Procedure Laterality Date  . BLADDER SUSPENSION  2001  . BREAST BIOPSY Right   . BREAST BIOPSY Left   . BREAST CYST ASPIRATION Left 1985  . BREAST EXCISIONAL BIOPSY Right 1975   x 2  . BREAST SURGERY Left 01/23/2014   T1c, N0; Er/ PR positive,  Her 2 negative   2 years anti-estrogen RX. Oncotype DX: 13,Low risk.9% over 10 years  . BREAST SURGERY Left 04/10/2014   Debridement of fat necrosis, left breast cancer wide excision site.   . CHOLECYSTECTOMY    . COLONOSCOPY  2014   Dr Tiffany Kocher  . COLONOSCOPY WITH PROPOFOL N/A 10/10/2016   Procedure: COLONOSCOPY WITH PROPOFOL;  Surgeon: Manya Silvas, MD;   Location: [facility] ENDOSCOPY;  Service: Endoscopy;  Laterality: N/A;  . ELECTROMAGNETIC NAVIGATION BROCHOSCOPY N/A 04/12/2018   Procedure: ELECTROMAGNETIC NAVIGATION BRONCHOSCOPY;  Surgeon: Flora Lipps, MD;  Location: [facility] ORS;  Service: Cardiopulmonary;  Laterality: N/A;  . LAPAROSCOPIC BILATERAL SALPINGO OOPHERECTOMY Bilateral 06/27/2018   Procedure: LAPAROSCOPIC BILATERAL SALPINGO OOPHORECTOMY;  Surgeon: Homero Fellers, MD;  Location: [facility] ORS;  Service: Gynecology;  Laterality: Bilateral;  . ROTATOR CUFF REPAIR Right 1995  . VAGINAL HYSTERECTOMY  1978   partial  . VULVECTOMY PARTIAL  1995  . wide excision debriedment Left 04/10/14    SOCIAL HISTORY: 50-pack-year history of smoking.  Quit 2016.  No alcohol abuse. Social History   Socioeconomic History  . Marital status: Divorced    Spouse name: Not on file  . Number of children: 2  . Years of education: Not on file  . Highest education level: Some college, no degree  Occupational History  . Occupation: retired  Tobacco Use  . Smoking status: Former Smoker    Packs/day: 1.00    Years: 50.00    Pack years: 50.00    Types: Cigarettes    Quit date: 09/15/2014    Years since quitting: 6.1  . Smokeless tobacco: Never Used  Vaping Use  . Vaping Use: Never used  Substance and Sexual Activity  . Alcohol use: Yes    Alcohol/week: 7.0 standard drinks    Types: 7 Cans of beer per week    Comment: usually beer, occasionally wine  . Drug use: No  . Sexual activity: Not Currently    Birth control/protection: Surgical  Other Topics Concern  . Not on file  Social History Narrative  . Not on file   Social Determinants of Health   Financial Resource Strain: Low Risk   . Difficulty of Paying Living Expenses: Not hard at all  Food Insecurity: No Food Insecurity  . Worried About Charity fundraiser in the Last Year: Never true  . Ran Out of Food in the Last Year: Never true  Transportation Needs: No Transportation Needs  . Lack of  Transportation (Medical): No  . Lack of Transportation (Non-Medical): No  Physical Activity: Inactive  . Days of Exercise per Week: 0 days  . Minutes of Exercise per Session: 0 min  Stress: No Stress Concern Present  . Feeling of Stress : Not at all  Social Connections: Socially Isolated  . Frequency of Communication with Friends and Family: Twice a week  . Frequency of Social Gatherings with Friends and Family: More than three times a week  . Attends Religious Services: Never  . Active Member of Clubs or Organizations: No  . Attends Archivist Meetings: Never  . Marital Status: Divorced  Human resources officer Violence: Not At Risk  . Fear of Current or Ex-Partner: No  . Emotionally Abused: No  . Physically Abused: No  . Sexually Abused: No    FAMILY HISTORY: Family History  Problem Relation Age of Onset  . Diabetes Father   . COPD Mother   . Heart failure Sister   . Emphysema Sister   .  Diabetes Sister   . Hypertension Sister   . Emphysema Brother   . Lung cancer Brother   . Crohn's disease Sister   . Hypertension Sister   . Hypertension Sister   . Emphysema Brother   . Cancer Other        great great paternal aunt, ? age  . Cancer Maternal Grandfather 26       ? source  . Cancer Paternal Grandfather 21       ? source  . Breast cancer Neg Hx     ALLERGIES:  is allergic to codeine, gramineae pollens, hydrochlorothiazide, and levaquin [levofloxacin in d5w].  MEDICATIONS:  Current Outpatient Medications  Medication Sig Dispense Refill  . albuterol (VENTOLIN HFA) 108 (90 Base) MCG/ACT inhaler Inhale into the lungs.    . ALPRAZolam (XANAX) 0.5 MG tablet Take 1 tablet (0.5 mg total) by mouth at bedtime as needed for anxiety. 30 tablet 5  . Cholecalciferol (VITAMIN D3) 50 MCG (2000 UT) TABS Take by mouth.    . ezetimibe (ZETIA) 10 MG tablet Take 1 tablet (10 mg total) by mouth daily. 30 tablet 11  . fluticasone (FLONASE) 50 MCG/ACT nasal spray Place 2 sprays into  both nostrils daily. 16 g 6  . Fluticasone-Umeclidin-Vilant (TRELEGY ELLIPTA) 100-62.5-25 MCG/INH AEPB Inhale into the lungs.    . gabapentin (NEURONTIN) 300 MG capsule Take 1 capsule (300 mg total) by mouth 2 (two) times daily as needed. For pain 180 capsule 1  . ibuprofen (ADVIL) 600 MG tablet TAKE 1 TABLET(600 MG) BY MOUTH EVERY 8 HOURS AS NEEDED 90 tablet 1  . metoprolol succinate (TOPROL-XL) 25 MG 24 hr tablet Take 1 tablet (25 mg total) by mouth daily. 90 tablet 3   No current facility-administered medications for this visit.      Marland Kitchen  PHYSICAL EXAMINATION: ECOG PERFORMANCE STATUS: 0 - Asymptomatic  Vitals:   11/11/20 1454  BP: (!) 145/96  Pulse: 79  Resp: 16  Temp: (!) 97.5 F (36.4 C)  SpO2: 98%   Filed Weights   11/11/20 1454  Weight: 266 lb (120.7 kg)    Physical Exam Constitutional:      Comments: She is in a wheelchair.  She is alone.  HENT:     Head: Normocephalic and atraumatic.     Mouth/Throat:     Pharynx: No oropharyngeal exudate.  Eyes:     Pupils: Pupils are equal, round, and reactive to light.  Cardiovascular:     Rate and Rhythm: Normal rate and regular rhythm.  Pulmonary:     Effort: No respiratory distress.     Breath sounds: No wheezing.  Abdominal:     General: Bowel sounds are normal. There is no distension.     Palpations: Abdomen is soft. There is no mass.     Tenderness: There is no abdominal tenderness. There is no guarding or rebound.  Musculoskeletal:        General: No tenderness. Normal range of motion.     Cervical back: Normal range of motion and neck supple.  Skin:    General: Skin is warm.  Neurological:     Mental Status: She is alert and oriented to person, place, and time.  Psychiatric:        Mood and Affect: Affect normal.      LABORATORY DATA:  I have reviewed the data as listed Lab Results  Component Value Date   WBC 5.7 10/08/2020   HGB 13.0 10/08/2020   HCT 38.7 10/08/2020  MCV 94 10/08/2020   PLT 236  10/08/2020   Recent Labs    02/13/20 1548 03/02/20 1456 04/15/20 1326 05/21/20 1403 10/08/20 1451  NA 143  --   --  142 143  K 4.6  --   --  4.4 5.0  CL 102  --   --  103 106  CO2 24  --   --  28 23  GLUCOSE 109*  --   --  115* 96  BUN 14  --   --  15 24  CREATININE 0.91  --  0.90 0.76 1.25*  CALCIUM 10.0  --   --  9.6 9.7  GFRNONAA 64  --   --  >60 43*  GFRAA 74  --   --   --  50*  PROT  --  6.9  --  7.5 6.7  ALBUMIN  --  4.6  --  4.3 4.5  AST  --  40  --  32 30  ALT  --  54*  --  40 44*  ALKPHOS  --  141*  --  114 164*  BILITOT  --  0.5  --  0.7 0.3  BILIDIR  --  0.12  --   --   --     RADIOGRAPHIC STUDIES: I have personally reviewed the radiological images as listed and agreed with the findings in the report. No results found.  ASSESSMENT & PLAN:   Malignant neoplasm of bronchus of right lower lobe (HCC) #Stage I non-small cell lung cancer [atypical cells on cytology] -s/p SBRT.  CT scan-September 2021-no evidence of recurrence.  #I would recommend continued surveillance with imaging in 6 months; patient will be due for a CT scan at this time.  Just been ordered.  # COPD-stable.  Patient not symptomatic..[Dr.Fleming]- not on trelegy/albutrol- defer to pulmonary.  # CKD-Stage III- GFR 43 [feb 2022]- STABLE>   # DISPOSITION: will call with results # CT scan chest in 1 week # follow up TBD- Dr.B  # 25 minutes face-to-face with the patient discussing the above plan of care; more than 50% of time spent on prognosis/ natural history; counseling and coordination.   Cc; hayley   All questions were answered. The patient knows to call the clinic with any problems, questions or concerns.    Cammie Sickle, MD 11/11/2020 4:39 PM

## 2020-11-11 NOTE — Progress Notes (Signed)
States she is going to have weight loss surgery on 12/07/20.

## 2020-11-19 ENCOUNTER — Ambulatory Visit
Admission: RE | Admit: 2020-11-19 | Discharge: 2020-11-19 | Disposition: A | Discharge status: Home / Self Care | Payer: Medicare PPO | Source: Ambulatory Visit | Attending: Internal Medicine | Admitting: Internal Medicine

## 2020-11-19 ENCOUNTER — Other Ambulatory Visit: Payer: Self-pay

## 2020-11-19 DIAGNOSIS — C3432 Malignant neoplasm of lower lobe, left bronchus or lung: Secondary | ICD-10-CM | POA: Insufficient documentation

## 2020-11-20 ENCOUNTER — Other Ambulatory Visit (HOSPITAL_COMMUNITY): Payer: Self-pay

## 2020-11-23 ENCOUNTER — Encounter: Payer: Medicare PPO | Attending: Surgery | Admitting: Skilled Nursing Facility1

## 2020-11-23 ENCOUNTER — Other Ambulatory Visit: Payer: Self-pay

## 2020-11-24 NOTE — Progress Notes (Signed)
Pre-Operative Nutrition Class:    Patient was seen on 11/23/2020 for Pre-Operative Bariatric Surgery Education at the [facility].    Surgery date: 12/07/2020 Surgery type: sleeve Start weight at [facility]: 262 Weight today: 269  Samples given per MNT protocol. Patient educated on appropriate usage: ProcareMultivitamin Lot [id number] Exp:05/22   procareVitamins Calcium  Lot #[id number] Exp:08/22  Protein 20Powder/Shake Lot #[id number] ccp 2505 Exp:04/23  The following the learning objectives were met by the patient during this course:  Identify Pre-Op Dietary Goals and will begin 2 weeks pre-operatively  Identify appropriate sources of fluids and proteins   State protein recommendations and appropriate sources pre and post-operatively  Identify Post-Operative Dietary Goals and will follow for 2 weeks post-operatively  Identify appropriate multivitamin and calcium sources  Describe the need for physical activity post-operatively and will follow MD recommendations  State when to call healthcare provider regarding medication questions or post-operative complications  When having a diagnosis of diabetes understanding hypoglycemia symptoms and the inclusion of 1 complex carbohydrate per meal  Handouts given during class include:  Pre-Op Bariatric Surgery Diet Handout  Protein Shake Handout  Post-Op Bariatric Surgery Nutrition Handout  BELT Program Information Flyer  Support Group Information Flyer  [facility] Bariatric Supplements Price List  Follow-Up Plan: Patient will follow-up at [facility] 2 weeks post operatively for diet advancement per MD.

## 2020-11-25 ENCOUNTER — Telehealth: Payer: Self-pay | Admitting: Internal Medicine

## 2020-11-25 DIAGNOSIS — C3432 Malignant neoplasm of lower lobe, left bronchus or lung: Secondary | ICD-10-CM | POA: Insufficient documentation

## 2020-11-25 NOTE — Telephone Encounter (Signed)
Dr. Baruch Gouty has already ordered the CT so I will just do the 6 mth appt

## 2020-11-25 NOTE — Telephone Encounter (Signed)
On 4/08-spoke to patient regarding the results of the CT scan negative for any recurrence.  C-please schedule follow-up in 6 months-MD; labs CBC CMP; CT chest prior

## 2020-11-25 NOTE — Addendum Note (Signed)
Addended by: Gloris Ham on: 11/25/2020 10:39 AM   Modules accepted: Orders

## 2020-11-26 NOTE — Progress Notes (Signed)
Sent message, via epic in basket, requesting orders in epic from surgeon.  

## 2020-12-02 NOTE — Patient Instructions (Addendum)
DUE TO COVID-19 ONLY ONE VISITOR IS ALLOWED TO COME WITH YOU AND STAY IN THE WAITING ROOM ONLY DURING PRE OP AND PROCEDURE DAY OF SURGERY. THE 1 VISITOR  MAY VISIT WITH YOU AFTER SURGERY IN YOUR PRIVATE ROOM DURING VISITING HOURS ONLY!  YOU NEED TO HAVE A COVID 19 TEST ON: 12/03/20  , THIS TEST MUST BE DONE BEFORE SURGERY,  COVID TESTING SITE [address] [place] [address], IT IS ON THE RIGHT GOING OUT [address] APPROXIMATELY  2 MINUTES PAST [employer] ON THE RIGHT. ONCE YOUR COVID TEST IS COMPLETED,  PLEASE BEGIN THE QUARANTINE INSTRUCTIONS AS OUTLINED IN YOUR HANDOUT.                Michelle Pitts    Your procedure is scheduled on: 12/07/20   Report to [facility] Main  Entrance   Report to short stay at: 5:15 AM     Call this number if you have problems the morning of surgery [phone]    Remember: Do not eat food or drink liquids :After Midnight.   BRUSH YOUR TEETH MORNING OF SURGERY AND RINSE YOUR MOUTH OUT, NO CHEWING GUM CANDY OR MINTS.    Take these medicines the morning of surgery with A SIP OF WATER: gabapentin,metoprolol.Use flonase and inhalers as usual.                               You may not have any metal on your body including hair pins and              piercings  Do not wear jewelry, make-up, lotions, powders or perfumes, deodorant             Do not wear nail polish on your fingernails.  Do not shave  48 hours prior to surgery.    Do not bring valuables to the hospital. [place].  Contacts, dentures or bridgework may not be worn into surgery.  Leave suitcase in the car. After surgery it may be brought to your room.     Patients discharged the day of surgery will not be allowed to drive home. IF YOU ARE HAVING SURGERY AND GOING HOME THE SAME DAY, YOU MUST HAVE AN ADULT TO DRIVE YOU HOME AND BE WITH YOU FOR 24 HOURS. YOU MAY GO HOME BY TAXI OR UBER OR ORTHERWISE, BUT AN ADULT  MUST ACCOMPANY YOU HOME AND STAY WITH YOU FOR 24 HOURS.  Name and phone number of your driver:  Special Instructions: N/A              Please read over the following fact sheets you were given: _____________________________________________________________________             [facility] - Preparing for Surgery Before surgery, you can play an important role.  Because skin is not sterile, your skin needs to be as free of germs as possible.  You can reduce the number of germs on your skin by washing with CHG (chlorahexidine gluconate) soap before surgery.  CHG is an antiseptic cleaner which kills germs and bonds with the skin to continue killing germs even after washing. Please DO NOT use if you have an allergy to CHG or antibacterial soaps.  If your skin becomes reddened/irritated stop using the CHG and inform your nurse  when you arrive at Short Stay. Do not shave (including legs and underarms) for at least 48 hours prior to the first CHG shower.  You may shave your face/neck. Please follow these instructions carefully:  1.  Shower with CHG Soap the night before surgery and the  morning of Surgery.  2.  If you choose to wash your hair, wash your hair first as usual with your  normal  shampoo.  3.  After you shampoo, rinse your hair and body thoroughly to remove the  shampoo.                           4.  Use CHG as you would any other liquid soap.  You can apply chg directly  to the skin and wash                       Gently with a scrungie or clean washcloth.  5.  Apply the CHG Soap to your body ONLY FROM THE NECK DOWN.   Do not use on face/ open                           Wound or open sores. Avoid contact with eyes, ears mouth and genitals (private parts).                       Wash face,  Genitals (private parts) with your normal soap.             6.  Wash thoroughly, paying special attention to the area where your surgery  will be performed.  7.  Thoroughly rinse your body with warm water  from the neck down.  8.  DO NOT shower/wash with your normal soap after using and rinsing off  the CHG Soap.                9.  Pat yourself dry with a clean towel.            10.  Wear clean pajamas.            11.  Place clean sheets on your bed the night of your first shower and do not  sleep with pets. Day of Surgery : Do not apply any lotions/deodorants the morning of surgery.  Please wear clean clothes to the hospital/surgery center.  FAILURE TO FOLLOW THESE INSTRUCTIONS MAY RESULT IN THE CANCELLATION OF YOUR SURGERY PATIENT SIGNATURE_________________________________  NURSE SIGNATURE__________________________________  ________________________________________________________________________

## 2020-12-03 ENCOUNTER — Encounter (HOSPITAL_COMMUNITY): Payer: Self-pay

## 2020-12-03 ENCOUNTER — Other Ambulatory Visit (HOSPITAL_COMMUNITY)
Admission: RE | Admit: 2020-12-03 | Discharge: 2020-12-03 | Disposition: A | Discharge status: Home / Self Care | Payer: Medicare PPO | Source: Ambulatory Visit | Attending: Surgery | Admitting: Surgery

## 2020-12-03 ENCOUNTER — Other Ambulatory Visit: Payer: Self-pay

## 2020-12-03 ENCOUNTER — Encounter (HOSPITAL_COMMUNITY)
Admission: RE | Admit: 2020-12-03 | Discharge: 2020-12-03 | Disposition: A | Discharge status: Home / Self Care | Payer: Medicare PPO | Source: Ambulatory Visit | Attending: Surgery | Admitting: Surgery

## 2020-12-03 DIAGNOSIS — Z01812 Encounter for preprocedural laboratory examination: Secondary | ICD-10-CM | POA: Diagnosis not present

## 2020-12-03 DIAGNOSIS — I10 Essential (primary) hypertension: Secondary | ICD-10-CM | POA: Insufficient documentation

## 2020-12-03 DIAGNOSIS — J449 Chronic obstructive pulmonary disease, unspecified: Secondary | ICD-10-CM | POA: Diagnosis not present

## 2020-12-03 DIAGNOSIS — Z79899 Other long term (current) drug therapy: Secondary | ICD-10-CM | POA: Diagnosis not present

## 2020-12-03 DIAGNOSIS — Z20822 Contact with and (suspected) exposure to covid-19: Secondary | ICD-10-CM | POA: Insufficient documentation

## 2020-12-03 DIAGNOSIS — Z7901 Long term (current) use of anticoagulants: Secondary | ICD-10-CM | POA: Insufficient documentation

## 2020-12-03 DIAGNOSIS — Z87891 Personal history of nicotine dependence: Secondary | ICD-10-CM | POA: Insufficient documentation

## 2020-12-03 HISTORY — DX: Anemia, unspecified: D64.9

## 2020-12-03 HISTORY — DX: Dyspnea, unspecified: R06.00

## 2020-12-03 LAB — CBC
HCT: 40.4 % (ref 36.0–46.0)
Hemoglobin: 13 g/dL (ref 12.0–15.0)
MCH: 31.3 pg (ref 26.0–34.0)
MCHC: 32.2 g/dL (ref 30.0–36.0)
MCV: 97.3 fL (ref 80.0–100.0)
Platelets: 233 10*3/uL (ref 150–400)
RBC: 4.15 MIL/uL (ref 3.87–5.11)
RDW: 13 % (ref 11.5–15.5)
WBC: 7.2 10*3/uL (ref 4.0–10.5)
nRBC: 0 % (ref 0.0–0.2)

## 2020-12-03 LAB — BASIC METABOLIC PANEL
Anion gap: 7 (ref 5–15)
BUN: 27 mg/dL — ABNORMAL HIGH (ref 8–23)
CO2: 27 mmol/L (ref 22–32)
Calcium: 9.7 mg/dL (ref 8.9–10.3)
Chloride: 107 mmol/L (ref 98–111)
Creatinine, Ser: 0.91 mg/dL (ref 0.44–1.00)
GFR, Estimated: >60 mL/min (ref >60)
Glucose, Bld: 103 mg/dL — ABNORMAL HIGH (ref 70–99)
Potassium: 4.4 mmol/L (ref 3.5–5.1)
Sodium: 141 mmol/L (ref 135–145)

## 2020-12-03 LAB — SARS CORONAVIRUS 2 (TAT 6-24 HRS): SARS Coronavirus 2: NEGATIVE

## 2020-12-03 NOTE — Progress Notes (Signed)
COVID Vaccine Completed: Yes Date COVID Vaccine completed: 08/10/20 [place] COVID vaccine manufacturer: [employer]     PCP - Fenton Malling: PA-C Cardiologist -   Chest x-ray - CT chest: 11/19/20 EKG - 05/21/20 Stress Test -  ECHO - 05/15/20  Cardiac Cath -  Pacemaker/ICD device last checked: A1-C: 5.6 @ 10/08/20  Sleep Study - Yes CPAP - No  Fasting Blood Sugar -  Checks Blood Sugar _____ times a day  Blood Thinner Instructions: Aspirin Instructions: Last Dose:  Anesthesia review: Hx: Dysrhythmias,COPD,HTN,OSA(No CPAP)  Patient denies shortness of breath, fever, cough and chest pain at PAT appointment   Patient verbalized understanding of instructions that were given to them at the PAT appointment. Patient was also instructed that they will need to review over the PAT instructions again at home before surgery.

## 2020-12-04 NOTE — Anesthesia Preprocedure Evaluation (Addendum)
Anesthesia Evaluation  Patient identified by MRN, date of birth, ID band Patient awake    Reviewed: Allergy & Precautions, NPO status , Patient's Chart, lab work & pertinent test results  Airway Mallampati: II  TM Distance: >3 FB Neck ROM: Full    Dental no notable dental hx.    Pulmonary sleep apnea , former smoker,    Pulmonary exam normal breath sounds clear to auscultation       Cardiovascular hypertension, Normal cardiovascular exam Rhythm:Regular Rate:Normal     Neuro/Psych negative neurological ROS  negative psych ROS   GI/Hepatic Neg liver ROS, GERD  ,  Endo/Other  Hypothyroidism Morbid obesity  Renal/GU negative Renal ROS  negative genitourinary   Musculoskeletal negative musculoskeletal ROS (+)   Abdominal   Peds negative pediatric ROS (+)  Hematology negative hematology ROS (+)   Anesthesia Other Findings   Reproductive/Obstetrics negative OB ROS                            Anesthesia Physical Anesthesia Plan  ASA: III  Anesthesia Plan: General   Post-op Pain Management:    Induction: Intravenous  PONV Risk Score and Plan: 3 and Ondansetron, Dexamethasone and Treatment may vary due to age or medical condition  Airway Management Planned: Oral ETT  Additional Equipment:   Intra-op Plan:   Post-operative Plan: Extubation in OR  Informed Consent: I have reviewed the patients History and Physical, chart, labs and discussed the procedure including the risks, benefits and alternatives for the proposed anesthesia with the patient or authorized representative who has indicated his/her understanding and acceptance.     Dental advisory given  Plan Discussed with: CRNA and Surgeon  Anesthesia Plan Comments: (See APP note by Durel Salts, FNP )       Anesthesia Quick Evaluation

## 2020-12-04 NOTE — Progress Notes (Signed)
Anesthesia Chart Review:   Case: [id number] Date/Time: 12/07/20 0715   Procedures:      LAPAROSCOPIC GASTRIC SLEEVE RESECTION (N/A )     UPPER GI ENDOSCOPY (N/A )   Anesthesia type: General   Pre-op diagnosis: MORBID OBESITY   Location: [facility] / [facility]   Surgeons: Johnathan Hausen, MD      DISCUSSION: Pt is 70 years old with hx dysrhythmia (unspecified - evaluated at [facility] in 2016 for tachycardia- notes in care everywhere), HTN, COPD, OSA, lung cancer, breast cancer, anemia. Former smoker (quit 2016).    PROVIDERS: - PCP is Mar Daring, PA-C - Oncologist is Charlaine Dalton, MD. Last office visit 11/11/20 for routine surveillance - Saw cardiologist Ida Rogue, MD on 04/21/20 for eval coronary calcifications on CT. Calcifications felt to be mild. Pt declined offer of stress test. Echo showed normal cardiac function    LABS: Labs reviewed: Acceptable for surgery. (all labs ordered are listed, but only abnormal results are displayed)  Labs Reviewed  BASIC METABOLIC PANEL - Abnormal; Notable for the following components:      Result Value   Glucose, Bld 103 (*)    BUN 27 (*)    All other components within normal limits  CBC     IMAGES: CT chest 11/20/20:  1. No significant change in post treatment appearance of the left lower lobe, with bandlike post radiation scarring about a nodule measuring approximately 1.2 x 1.0 cm. 2. 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 3. Emphysema. 4. Coronary artery disease. 5. Hepatic steatosis.   EKG 05/21/20: NSR. Cannot rule out Anterior infarct. Appears stable when compared with EKG 06/18/18   CV: Echo 05/15/20:  1. Left ventricular ejection fraction, by estimation, is 55 to 60%. The left ventricle has normal function. The left ventricle has no regional wall motion abnormalities. Left ventricular diastolic parameters are consistent with Grade I diastolic  dysfunction (impaired relaxation).  2. Right ventricular systolic function is normal. The right ventricular size is normal. Tricuspid regurgitation signal is inadequate for assessing PA pressure.  3. Left atrial size was mildly dilated.    Holter monitor 07/28/15 (done at [facility], following results from note by Dr. Saralyn Pilar in care everywhere 08/11/15):  - normal at 91 beats per minute with occasional PVCs, and one 3 beat run of SVT   Past Medical History:  Diagnosis Date  . Anemia   . Anxiety   . Breast cancer ([place]) 01/23/2014   Left breast, T1c, N0, M0. ER/PR +; Her 2 neu not overexpressed. Oncotype DX: 13,Low risk.9% over 10 years.   . Cancer ([place]) 1995   vulvar, [facility]  . COPD (chronic obstructive pulmonary disease) (HCC)    bronchitis  . Dyspnea    with exertion  . Dysrhythmia   . GERD (gastroesophageal reflux disease)   . History of colon polyps   . Hypercholesterolemia   . Hypertension   . Hypothyroidism   . Insomnia   . Lung cancer ([place])   . 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 Personal history of radiation therapy   . Personal history of tobacco use, presenting hazards to health 11/18/2015  . Sleep apnea    NO CPAP  . Sleep apnea   . SOB (shortness of breath) on exertion   . Spinal stenosis   . Thyroid disease   . Vitamin D deficiency     Past Surgical History:  Procedure Laterality Date  . BLADDER  SUSPENSION  2001  . BREAST BIOPSY Right   . BREAST BIOPSY Left   . BREAST CYST ASPIRATION Left 1985  . BREAST EXCISIONAL BIOPSY Right 1975   x 2  . BREAST SURGERY Left 01/23/2014   T1c, N0; Er/ PR positive, Her 2 negative   2 years anti-estrogen RX. Oncotype DX: 13,Low risk.9% over 10 years  . BREAST SURGERY Left 04/10/2014   Debridement of fat necrosis, left breast cancer wide excision site.   . CHOLECYSTECTOMY    . COLONOSCOPY  2014   Dr Tiffany Kocher  . COLONOSCOPY WITH PROPOFOL N/A 10/10/2016   Procedure: COLONOSCOPY WITH PROPOFOL;  Surgeon: Manya Silvas, MD;   Location: [facility] ENDOSCOPY;  Service: Endoscopy;  Laterality: N/A;  . ELECTROMAGNETIC NAVIGATION BROCHOSCOPY N/A 04/12/2018   Procedure: ELECTROMAGNETIC NAVIGATION BRONCHOSCOPY;  Surgeon: Flora Lipps, MD;  Location: [facility] ORS;  Service: Cardiopulmonary;  Laterality: N/A;  . LAPAROSCOPIC BILATERAL SALPINGO OOPHERECTOMY Bilateral 06/27/2018   Procedure: LAPAROSCOPIC BILATERAL SALPINGO OOPHORECTOMY;  Surgeon: Homero Fellers, MD;  Location: [facility] ORS;  Service: Gynecology;  Laterality: Bilateral;  . ROTATOR CUFF REPAIR Right 1995  . VAGINAL HYSTERECTOMY  1978   partial  . VULVECTOMY PARTIAL  1995  . wide excision debriedment Left 04/10/14    MEDICATIONS: . albuterol (VENTOLIN HFA) 108 (90 Base) MCG/ACT inhaler  . ALPRAZolam (XANAX) 0.5 MG tablet  . Cholecalciferol (VITAMIN D3) 50 MCG (2000 UT) TABS  . ezetimibe (ZETIA) 10 MG tablet  . fluticasone (FLONASE) 50 MCG/ACT nasal spray  . Fluticasone-Umeclidin-Vilant (TRELEGY ELLIPTA) 100-62.5-25 MCG/INH AEPB  . gabapentin (NEURONTIN) 300 MG capsule  . ibuprofen (ADVIL) 600 MG tablet  . metoprolol succinate (TOPROL-XL) 25 MG 24 hr tablet   No current facility-administered medications for this encounter.    If no changes, I anticipate pt can proceed with surgery as scheduled.   Willeen Cass, PhD, FNP-BC [facility]/Anesthesiology Phone: [phone] 12/04/2020 1:15 PM

## 2020-12-04 NOTE — H&P (Signed)
Chief Complaint:  Obesity and BMI 45  History of Present Illness:  Michelle Pitts is an 70 y.o. female who was seen last year and subjected to 6 months supervised diet management.  She was seen back in the office on March 24 at which time her questions were answered and she was prepared for sleeve gastrectomy.    Past Medical History:  Diagnosis Date  . Anemia   . Anxiety   . Breast cancer ([place]) 01/23/2014   Left breast, T1c, N0, M0. ER/PR +; Her 2 neu not overexpressed. Oncotype DX: 13,Low risk.9% over 10 years.   . Cancer ([place]) 1995   vulvar, [facility]  . COPD (chronic obstructive pulmonary disease) (HCC)    bronchitis  . Dyspnea    with exertion  . Dysrhythmia   . GERD (gastroesophageal reflux disease)   . History of colon polyps   . Hypercholesterolemia   . Hypertension   . Hypothyroidism   . Insomnia   . Lung cancer ([place])   . 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 Personal history of radiation therapy   . Personal history of tobacco use, presenting hazards to health 11/18/2015  . Sleep apnea    NO CPAP  . Sleep apnea   . SOB (shortness of breath) on exertion   . Spinal stenosis   . Thyroid disease   . Vitamin D deficiency     Past Surgical History:  Procedure Laterality Date  . BLADDER SUSPENSION  2001  . BREAST BIOPSY Right   . BREAST BIOPSY Left   . BREAST CYST ASPIRATION Left 1985  . BREAST EXCISIONAL BIOPSY Right 1975   x 2  . BREAST SURGERY Left 01/23/2014   T1c, N0; Er/ PR positive, Her 2 negative   2 years anti-estrogen RX. Oncotype DX: 13,Low risk.9% over 10 years  . BREAST SURGERY Left 04/10/2014   Debridement of fat necrosis, left breast cancer wide excision site.   . CHOLECYSTECTOMY    . COLONOSCOPY  2014   Dr Tiffany Kocher  . COLONOSCOPY WITH PROPOFOL N/A 10/10/2016   Procedure: COLONOSCOPY WITH PROPOFOL;  Surgeon: Manya Silvas, MD;  Location: [facility] ENDOSCOPY;  Service: Endoscopy;  Laterality: N/A;  . ELECTROMAGNETIC NAVIGATION BROCHOSCOPY N/A 04/12/2018    Procedure: ELECTROMAGNETIC NAVIGATION BRONCHOSCOPY;  Surgeon: Flora Lipps, MD;  Location: [facility] ORS;  Service: Cardiopulmonary;  Laterality: N/A;  . LAPAROSCOPIC BILATERAL SALPINGO OOPHERECTOMY Bilateral 06/27/2018   Procedure: LAPAROSCOPIC BILATERAL SALPINGO OOPHORECTOMY;  Surgeon: Homero Fellers, MD;  Location: [facility] ORS;  Service: Gynecology;  Laterality: Bilateral;  . ROTATOR CUFF REPAIR Right 1995  . VAGINAL HYSTERECTOMY  1978   partial  . VULVECTOMY PARTIAL  1995  . wide excision debriedment Left 04/10/14    No current facility-administered medications for this encounter.   Current Outpatient Medications  Medication Sig Dispense Refill  . albuterol (VENTOLIN HFA) 108 (90 Base) MCG/ACT inhaler Inhale 1-2 puffs into the lungs every 6 (six) hours as needed for wheezing or shortness of breath.    . ALPRAZolam (XANAX) 0.5 MG tablet Take 1 tablet (0.5 mg total) by mouth at bedtime as needed for anxiety. 30 tablet 5  . Cholecalciferol (VITAMIN D3) 50 MCG (2000 UT) TABS Take 2,000 Units by mouth daily.    Marland Kitchen ezetimibe (ZETIA) 10 MG tablet Take 1 tablet (10 mg total) by mouth daily. 30 tablet 11  . fluticasone (FLONASE) 50 MCG/ACT nasal spray Place 2 sprays into both nostrils daily. 16 g 6  . Fluticasone-Umeclidin-Vilant (TRELEGY ELLIPTA) 100-62.5-25  MCG/INH AEPB Inhale 1 puff into the lungs daily.    Marland Kitchen gabapentin (NEURONTIN) 300 MG capsule Take 1 capsule (300 mg total) by mouth 2 (two) times daily as needed. For pain (Patient taking differently: Take 300 mg by mouth in the morning.) 180 capsule 1  . ibuprofen (ADVIL) 600 MG tablet TAKE 1 TABLET(600 MG) BY MOUTH EVERY 8 HOURS AS NEEDED (Patient taking differently: Take 600 mg by mouth every 8 (eight) hours as needed for moderate pain.) 90 tablet 1  . metoprolol succinate (TOPROL-XL) 25 MG 24 hr tablet Take 1 tablet (25 mg total) by mouth daily. 90 tablet 3   Codeine, Gramineae pollens, Hydrochlorothiazide, and Levaquin [levofloxacin in  d5w] Family History  Problem Relation Age of Onset  . Diabetes Father   . COPD Mother   . Heart failure Sister   . Emphysema Sister   . Diabetes Sister   . Hypertension Sister   . Emphysema Brother   . Lung cancer Brother   . Crohn's disease Sister   . Hypertension Sister   . Hypertension Sister   . Emphysema Brother   . Cancer Other        great great paternal aunt, ? age  . Cancer Maternal Grandfather 63       ? source  . Cancer Paternal Grandfather 76       ? source  . Breast cancer Neg Hx    Social History:   reports that she quit smoking about 6 years ago. Her smoking use included cigarettes. She has a 50.00 pack-year smoking history. She has never used smokeless tobacco. She reports current alcohol use of about 7.0 standard drinks of alcohol per week. She reports that she does not use drugs.   REVIEW OF SYSTEMS : Negative except for see problem list  Physical Exam:   There were no vitals taken for this visit. There is no height or weight on file to calculate BMI.  Gen:  WDWN WF NAD  Neurological: Alert and oriented to person, place, and time. Motor and sensory function is grossly intact  Head: Normocephalic and atraumatic.  Eyes: Conjunctivae are normal. Pupils are equal, round, and reactive to light. No scleral icterus.  Neck: Normal range of motion. Neck supple. No tracheal deviation or thyromegaly present.  Cardiovascular:  SR without murmurs or gallops.  No carotid bruits Breast:  Prior left breast cancer with lumpectomy and RT Respiratory: Effort normal.  No respiratory distress. No chest wall tenderness. Breath sounds normal.  No wheezes, rales or rhonchi.  Abdomen:  Prior lap chole GU:  Not examined Musculoskeletal: Normal range of motion. Extremities are nontender. No cyanosis, edema or clubbing noted Lymphadenopathy: No cervical, preauricular, postauricular or axillary adenopathy is present Skin: Skin is warm and dry. No rash noted. No diaphoresis. No  erythema. No pallor. Pscyh: Normal mood and affect. Behavior is normal. Judgment and thought content normal.   LABORATORY RESULTS: Results for orders placed or performed during the hospital encounter of 12/03/20 (from the past 48 hour(s))  SARS CORONAVIRUS 2 (TAT 6-24 HRS) Nasopharyngeal Nasopharyngeal Swab     Status: None   Collection Time: 12/03/20  2:45 PM   Specimen: Nasopharyngeal Swab  Result Value Ref Range   SARS Coronavirus 2 NEGATIVE NEGATIVE    Comment: (NOTE) SARS-CoV-2 target nucleic acids are NOT DETECTED.  The SARS-CoV-2 RNA is generally detectable in upper and lower respiratory specimens during the acute phase of infection. Negative results do not preclude SARS-CoV-2 infection, do not rule  out co-infections with other pathogens, and should not be used as the sole basis for treatment or other patient management decisions. Negative results must be combined with clinical observations, patient history, and epidemiological information. The expected result is Negative.  Fact Sheet for Patients: SugarRoll.be  Fact Sheet for Healthcare Providers: https://www.woods-mathews.com/  This test is not yet approved or cleared by the [place] FDA and  has been authorized for detection and/or diagnosis of SARS-CoV-2 by FDA under an Emergency Use Authorization (EUA). This EUA will remain  in effect (meaning this test can be used) for the duration of the COVID-19 declaration under Se ction 564(b)(1) of the Act, 21 U.S.C. section 360bbb-3(b)(1), unless the authorization is terminated or revoked sooner.  Performed at [facility], [place] [address] [place], [place] [address]      RADIOLOGY RESULTS: No results found.  Problem List: Patient Active Problem List   Diagnosis Date Noted  . Primary cancer of left lower lobe of lung ([place]) 11/25/2020  . S/P bilateral oophorectomy 06/27/2018  . Aortic atherosclerosis ([place]) 06/21/2018   . Malignant neoplasm of lung ([place]) 06/21/2018  . Abnormal EKG 06/21/2018  . Coronary artery calcification seen on CT scan 06/21/2018  . Pneumothorax on left 04/18/2018  . Pneumothorax 04/13/2018  . Pneumothorax after biopsy 04/12/2018  . Bilateral leg numbness 11/10/2017  . Obesity, Class III, BMI 40-49.9 (morbid obesity) ([place]) 11/10/2017  . DNR no code (do not resuscitate) 02/08/2016  . Personal history of tobacco use, presenting hazards to health 11/18/2015  . Shortness of breath 07/24/2015  . Malignant neoplasm of lower-inner quadrant of left female breast ([place]) 07/22/2015  . Tachycardia 07/22/2015  . Anxiety 05/15/2015  . Colon polyp 05/15/2015  . CAFL (chronic airflow limitation) ([place]) 05/15/2015  . Elevated blood sugar 05/15/2015  . Abnormal liver enzymes 05/15/2015  . Family history of diabetes mellitus 05/15/2015  . BP (high blood pressure) 05/15/2015  . Cannot sleep 05/15/2015  . Apnea, sleep 05/15/2015  . Avitaminosis D 05/15/2015  . 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 Anxiety disorder 05/15/2015  . Chronic obstructive pulmonary disease ([place]) 05/15/2015  . Gastro-esophageal reflux disease without esophagitis 05/15/2015  . 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 Adult hypothyroidism 05/15/2015  . Pure hypercholesterolemia 05/15/2015  . Tobacco abuse, in remission 03/09/2015    Assessment & Plan: 70 year old with hx of breast, lung and vulvar cancer with obesity and COPD for lap sleeve gastrectomy.  She understands risks and indications for the procedure      Matt B. Hassell Done, MD, [facility] [phone] beeper [phone]  12/04/2020 4:11 PM

## 2020-12-05 NOTE — Progress Notes (Signed)
Patient ID: Michelle Pitts, female   DOB: [birth year], 70 y.o.   MRN: [id number]  Reason for Consult: Referral   Referred by Florian Buff*  Subjective:     HPI:  Michelle Pitts is a 70 y.o. female referred for bilateral ovarian cysts  Gynecological History  No LMP recorded. Patient has had a hysterectomy.   History of fibroids, polyps, or ovarian cysts? : no  History of PCOS? no Hstory of Endometriosis? no History of abnormal pap smears? no Have you had any sexually transmitted infections in the past? no  Obstetrical History  Past Medical History:  Diagnosis Date  . Anemia   . Anxiety   . Breast cancer ([place]) 01/23/2014   Left breast, T1c, N0, M0. ER/PR +; Her 2 neu not overexpressed. Oncotype DX: 13,Low risk.9% over 10 years.   . Cancer ([place]) 1995   vulvar, [facility]  . COPD (chronic obstructive pulmonary disease) (HCC)    bronchitis  . Dyspnea    with exertion  . Dysrhythmia   . GERD (gastroesophageal reflux disease)   . History of colon polyps   . Hypercholesterolemia   . Hypertension   . Hypothyroidism   . Insomnia   . Lung cancer ([place])   . 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 Personal history of radiation therapy   . Personal history of tobacco use, presenting hazards to health 11/18/2015  . Sleep apnea    NO CPAP  . Sleep apnea   . SOB (shortness of breath) on exertion   . Spinal stenosis   . Thyroid disease   . Vitamin D deficiency    Family History  Problem Relation Age of Onset  . Diabetes Father   . COPD Mother   . Heart failure Sister   . Emphysema Sister   . Diabetes Sister   . Hypertension Sister   . Emphysema Brother   . Lung cancer Brother   . Crohn's disease Sister   . Hypertension Sister   . Hypertension Sister   . Emphysema Brother   . Cancer Other        great great paternal aunt, ? age  . Cancer Maternal Grandfather 95       ? source  . Cancer Paternal Grandfather 79       ? source  . Breast cancer Neg Hx     Past Surgical History:  Procedure Laterality Date  . BLADDER SUSPENSION  2001  . BREAST BIOPSY Right   . BREAST BIOPSY Left   . BREAST CYST ASPIRATION Left 1985  . BREAST EXCISIONAL BIOPSY Right 1975   x 2  . BREAST SURGERY Left 01/23/2014   T1c, N0; Er/ PR positive, Her 2 negative   2 years anti-estrogen RX. Oncotype DX: 13,Low risk.9% over 10 years  . BREAST SURGERY Left 04/10/2014   Debridement of fat necrosis, left breast cancer wide excision site.   . CHOLECYSTECTOMY    . COLONOSCOPY  2014   Dr Tiffany Kocher  . COLONOSCOPY WITH PROPOFOL N/A 10/10/2016   Procedure: COLONOSCOPY WITH PROPOFOL;  Surgeon: Manya Silvas, MD;  Location: [facility] ENDOSCOPY;  Service: Endoscopy;  Laterality: N/A;  . ELECTROMAGNETIC NAVIGATION BROCHOSCOPY N/A 04/12/2018   Procedure: ELECTROMAGNETIC NAVIGATION BRONCHOSCOPY;  Surgeon: Flora Lipps, MD;  Location: [facility] ORS;  Service: Cardiopulmonary;  Laterality: N/A;  . LAPAROSCOPIC BILATERAL SALPINGO OOPHERECTOMY Bilateral 06/27/2018   Procedure: LAPAROSCOPIC BILATERAL SALPINGO OOPHORECTOMY;  Surgeon: Homero Fellers, MD;  Location: [facility] ORS;  Service: Gynecology;  Laterality: Bilateral;  . ROTATOR CUFF REPAIR Right 1995  . VAGINAL HYSTERECTOMY  1978   partial  . VULVECTOMY PARTIAL  1995  . wide excision debriedment Left 04/10/14    Short Social History:  Social History   Tobacco Use  . Smoking status: Former Smoker    Packs/day: 1.00    Years: 50.00    Pack years: 50.00    Types: Cigarettes    Quit date: 09/15/2014    Years since quitting: 6.2  . Smokeless tobacco: Never Used  Substance Use Topics  . Alcohol use: Yes    Alcohol/week: 7.0 standard drinks    Types: 7 Cans of beer per week    Comment: usually beer, occasionally wine. Daily    Allergies  Allergen Reactions  . Codeine Nausea And Vomiting  . Gramineae Pollens     sinus  . Hydrochlorothiazide Swelling and Other (See Comments)    Swelling of tongue.  [place] [Levofloxacin In  D5w] Hives, Swelling and Other (See Comments)    Leg swelling     Current Outpatient Medications  Medication Sig Dispense Refill  . albuterol (VENTOLIN HFA) 108 (90 Base) MCG/ACT inhaler Inhale 1-2 puffs into the lungs every 6 (six) hours as needed for wheezing or shortness of breath.    . ALPRAZolam (XANAX) 0.5 MG tablet Take 1 tablet (0.5 mg total) by mouth at bedtime as needed for anxiety. 30 tablet 5  . Cholecalciferol (VITAMIN D3) 50 MCG (2000 UT) TABS Take 2,000 Units by mouth daily.    Marland Kitchen ezetimibe (ZETIA) 10 MG tablet Take 1 tablet (10 mg total) by mouth daily. 30 tablet 11  . fluticasone (FLONASE) 50 MCG/ACT nasal spray Place 2 sprays into both nostrils daily. 16 g 6  . Fluticasone-Umeclidin-Vilant (TRELEGY ELLIPTA) 100-62.5-25 MCG/INH AEPB Inhale 1 puff into the lungs daily.    Marland Kitchen gabapentin (NEURONTIN) 300 MG capsule Take 1 capsule (300 mg total) by mouth 2 (two) times daily as needed. For pain (Patient taking differently: Take 300 mg by mouth in the morning.) 180 capsule 1  . ibuprofen (ADVIL) 600 MG tablet TAKE 1 TABLET(600 MG) BY MOUTH EVERY 8 HOURS AS NEEDED (Patient taking differently: Take 600 mg by mouth every 8 (eight) hours as needed for moderate pain.) 90 tablet 1  . metoprolol succinate (TOPROL-XL) 25 MG 24 hr tablet Take 1 tablet (25 mg total) by mouth daily. 90 tablet 3   No current facility-administered medications for this visit.    Review of Systems  Constitutional: Negative for chills, fatigue, fever and unexpected weight change.  HENT: Negative for trouble swallowing.  Eyes: Negative for loss of vision.  Respiratory: Negative for cough, shortness of breath and wheezing.  Cardiovascular: Negative for chest pain, leg swelling, palpitations and syncope.  GI: Negative for abdominal pain, blood in stool, diarrhea, nausea and vomiting.  GU: Negative for difficulty urinating, dysuria, frequency and hematuria.  Musculoskeletal: Negative for back pain, leg pain and joint  pain.  Skin: Negative for rash.  Neurological: Negative for dizziness, headaches, light-headedness, numbness and seizures.  Psychiatric: Negative for behavioral problem, confusion, depressed mood and sleep disturbance.        Objective:  Objective   Vitals:   05/21/18 1434  BP: 124/78  Weight: 250 lb (113.4 kg)  Height: 5\' 4"  (1.626 m)   Body mass index is 42.91 kg/m.  Physical Exam Vitals and nursing note reviewed. Exam conducted with a chaperone present.  Constitutional:      Appearance: Normal appearance.  She is well-developed.  HENT:     Head: Normocephalic and atraumatic.  Eyes:     Extraocular Movements: Extraocular movements intact.     Pupils: Pupils are equal, round, and reactive to light.  Cardiovascular:     Rate and Rhythm: Normal rate and regular rhythm.  Pulmonary:     Effort: Pulmonary effort is normal. No respiratory distress.     Breath sounds: Normal breath sounds.  Abdominal:     General: Abdomen is flat.     Palpations: Abdomen is soft.  Genitourinary:    Comments: External: abormal appearing vulva, left labia biopsied. No lesions noted.  Speculum examination: Normal appearing cuff Bimanual examination: Bilateral adnexal masses. No adnexal tenderness. Pelvis not fixed.  Musculoskeletal:        General: No signs of injury.  Skin:    General: Skin is warm and dry.  Neurological:     Mental Status: She is alert and oriented to person, place, and time.  Psychiatric:        Behavior: Behavior normal.        Thought Content: Thought content normal.        Judgment: Judgment normal.     Assessment/Plan:    70 yo 1. Bilateral complex ovarian cysts- will obtain pelvic US, check ROMA 2. Vulvar lesion- biopsied today  Follow up planned.    Adrian Prows MD [facility], [facility] 12/05/2020 5:14 PM

## 2020-12-06 MED ORDER — BUPIVACAINE LIPOSOME 1.3 % IJ SUSP
20.0000 mL | Freq: Once | INTRAMUSCULAR | Status: DC
  Filled 2020-12-06: qty 20

## 2020-12-07 ENCOUNTER — Encounter (HOSPITAL_COMMUNITY)
Admission: RE | Disposition: A | Discharge status: Home / Self Care | Payer: Self-pay | Source: Home / Self Care | Attending: Surgery

## 2020-12-07 ENCOUNTER — Inpatient Hospital Stay (HOSPITAL_COMMUNITY): Payer: Medicare PPO | Admitting: Certified Registered"

## 2020-12-07 ENCOUNTER — Encounter (HOSPITAL_COMMUNITY): Payer: Self-pay | Admitting: Surgery

## 2020-12-07 ENCOUNTER — Inpatient Hospital Stay (HOSPITAL_COMMUNITY): Payer: Medicare PPO | Admitting: Emergency Medicine

## 2020-12-07 ENCOUNTER — Inpatient Hospital Stay (HOSPITAL_COMMUNITY)
Admission: RE | Admit: 2020-12-07 | Discharge: 2020-12-09 | DRG: 621 | Disposition: A | Discharge status: Home / Self Care | Payer: Medicare PPO | Attending: General Surgery | Admitting: General Surgery

## 2020-12-07 ENCOUNTER — Other Ambulatory Visit: Payer: Self-pay

## 2020-12-07 DIAGNOSIS — Z90722 Acquired absence of ovaries, bilateral: Secondary | ICD-10-CM

## 2020-12-07 DIAGNOSIS — Z9049 Acquired absence of other specified parts of digestive tract: Secondary | ICD-10-CM

## 2020-12-07 DIAGNOSIS — F419 Anxiety disorder, unspecified: Secondary | ICD-10-CM | POA: Diagnosis present

## 2020-12-07 DIAGNOSIS — Z90711 Acquired absence of uterus with remaining cervical stump: Secondary | ICD-10-CM

## 2020-12-07 DIAGNOSIS — Z833 Family history of diabetes mellitus: Secondary | ICD-10-CM | POA: Diagnosis not present

## 2020-12-07 DIAGNOSIS — Z8249 Family history of ischemic heart disease and other diseases of the circulatory system: Secondary | ICD-10-CM | POA: Diagnosis not present

## 2020-12-07 DIAGNOSIS — Z85118 Personal history of other malignant neoplasm of bronchus and lung: Secondary | ICD-10-CM

## 2020-12-07 DIAGNOSIS — Z79899 Other long term (current) drug therapy: Secondary | ICD-10-CM | POA: Diagnosis not present

## 2020-12-07 DIAGNOSIS — I10 Essential (primary) hypertension: Secondary | ICD-10-CM | POA: Diagnosis present

## 2020-12-07 DIAGNOSIS — E039 Hypothyroidism, unspecified: Secondary | ICD-10-CM | POA: Diagnosis present

## 2020-12-07 DIAGNOSIS — Z6841 Body Mass Index (BMI) 40.0 and over, adult: Secondary | ICD-10-CM

## 2020-12-07 DIAGNOSIS — Z9884 Bariatric surgery status: Secondary | ICD-10-CM

## 2020-12-07 DIAGNOSIS — Z8544 Personal history of malignant neoplasm of other female genital organs: Secondary | ICD-10-CM | POA: Diagnosis not present

## 2020-12-07 DIAGNOSIS — G4730 Sleep apnea, unspecified: Secondary | ICD-10-CM | POA: Diagnosis present

## 2020-12-07 DIAGNOSIS — Z825 Family history of asthma and other chronic lower respiratory diseases: Secondary | ICD-10-CM

## 2020-12-07 DIAGNOSIS — J449 Chronic obstructive pulmonary disease, unspecified: Secondary | ICD-10-CM | POA: Diagnosis present

## 2020-12-07 DIAGNOSIS — Z87891 Personal history of nicotine dependence: Secondary | ICD-10-CM | POA: Diagnosis not present

## 2020-12-07 DIAGNOSIS — Z923 Personal history of irradiation: Secondary | ICD-10-CM | POA: Diagnosis not present

## 2020-12-07 DIAGNOSIS — K219 Gastro-esophageal reflux disease without esophagitis: Secondary | ICD-10-CM | POA: Diagnosis present

## 2020-12-07 DIAGNOSIS — Z801 Family history of malignant neoplasm of trachea, bronchus and lung: Secondary | ICD-10-CM | POA: Diagnosis not present

## 2020-12-07 DIAGNOSIS — Z853 Personal history of malignant neoplasm of breast: Secondary | ICD-10-CM

## 2020-12-07 DIAGNOSIS — E7800 Pure hypercholesterolemia, unspecified: Secondary | ICD-10-CM | POA: Diagnosis present

## 2020-12-07 HISTORY — PX: LAPAROSCOPIC GASTRIC SLEEVE RESECTION: SHX5895

## 2020-12-07 HISTORY — PX: UPPER GI ENDOSCOPY: SHX6162

## 2020-12-07 LAB — COMPREHENSIVE METABOLIC PANEL
ALT: 50 U/L — ABNORMAL HIGH (ref 0–44)
AST: 44 U/L — ABNORMAL HIGH (ref 15–41)
Albumin: 4.2 g/dL (ref 3.5–5.0)
Alkaline Phosphatase: 121 U/L (ref 38–126)
Anion gap: 8 (ref 5–15)
BUN: 22 mg/dL (ref 8–23)
CO2: 25 mmol/L (ref 22–32)
Calcium: 9.4 mg/dL (ref 8.9–10.3)
Chloride: 108 mmol/L (ref 98–111)
Creatinine, Ser: 0.89 mg/dL (ref 0.44–1.00)
GFR, Estimated: >60 mL/min (ref >60)
Glucose, Bld: 113 mg/dL — ABNORMAL HIGH (ref 70–99)
Potassium: 4.6 mmol/L (ref 3.5–5.1)
Sodium: 141 mmol/L (ref 135–145)
Total Bilirubin: 1.2 mg/dL (ref 0.3–1.2)
Total Protein: 7.5 g/dL (ref 6.5–8.1)

## 2020-12-07 LAB — TYPE AND SCREEN
ABO/RH(D): A NEG
Antibody Screen: NEGATIVE

## 2020-12-07 LAB — CBC
HCT: 41.3 % (ref 36.0–46.0)
Hemoglobin: 13.1 g/dL (ref 12.0–15.0)
MCH: 31.7 pg (ref 26.0–34.0)
MCHC: 31.7 g/dL (ref 30.0–36.0)
MCV: 100 fL (ref 80.0–100.0)
Platelets: 152 10*3/uL (ref 150–400)
RBC: 4.13 MIL/uL (ref 3.87–5.11)
RDW: 13.3 % (ref 11.5–15.5)
WBC: 9.7 10*3/uL (ref 4.0–10.5)
nRBC: 0 % (ref 0.0–0.2)

## 2020-12-07 LAB — SURGICAL PATHOLOGY

## 2020-12-07 SURGERY — GASTRECTOMY, SLEEVE, LAPAROSCOPIC
Anesthesia: General

## 2020-12-07 MED ORDER — CHLORHEXIDINE GLUCONATE 0.12 % MT SOLN
15.0000 mL | Freq: Once | OROMUCOSAL | Status: AC
  Administered 2020-12-07: 15 mL via OROMUCOSAL

## 2020-12-07 MED ORDER — METOPROLOL TARTRATE 5 MG/5ML IV SOLN
3.0000 mg | Freq: Once | INTRAVENOUS | Status: AC
  Administered 2020-12-07: 3 mg via INTRAVENOUS

## 2020-12-07 MED ORDER — PANTOPRAZOLE SODIUM 40 MG IV SOLR
40.0000 mg | Freq: Every day | INTRAVENOUS | Status: DC
  Administered 2020-12-07 – 2020-12-08 (×2): 40 mg via INTRAVENOUS
  Filled 2020-12-07 (×2): qty 40

## 2020-12-07 MED ORDER — HYDRALAZINE HCL 20 MG/ML IJ SOLN
10.0000 mg | Freq: Once | INTRAMUSCULAR | Status: AC
  Administered 2020-12-07: 10 mg via INTRAVENOUS

## 2020-12-07 MED ORDER — HEPARIN SODIUM (PORCINE) 5000 UNIT/ML IJ SOLN
5000.0000 [IU] | Freq: Three times a day (TID) | INTRAMUSCULAR | Status: DC
  Administered 2020-12-07 – 2020-12-09 (×6): 5000 [IU] via SUBCUTANEOUS
  Filled 2020-12-07 (×6): qty 1

## 2020-12-07 MED ORDER — ACETAMINOPHEN 500 MG PO TABS
1000.0000 mg | ORAL_TABLET | ORAL | Status: AC
  Administered 2020-12-07: 1000 mg via ORAL
  Filled 2020-12-07: qty 2

## 2020-12-07 MED ORDER — SUGAMMADEX SODIUM 500 MG/5ML IV SOLN
INTRAVENOUS | Status: AC
  Filled 2020-12-07: qty 5

## 2020-12-07 MED ORDER — HEPARIN SODIUM (PORCINE) 5000 UNIT/ML IJ SOLN
5000.0000 [IU] | INTRAMUSCULAR | Status: AC
  Administered 2020-12-07: 5000 [IU] via SUBCUTANEOUS
  Filled 2020-12-07: qty 1

## 2020-12-07 MED ORDER — PROPOFOL 10 MG/ML IV BOLUS
INTRAVENOUS | Status: AC
  Filled 2020-12-07: qty 20

## 2020-12-07 MED ORDER — SODIUM CHLORIDE 0.9 % IV SOLN
2.0000 g | INTRAVENOUS | Status: AC
  Administered 2020-12-07: 2 g via INTRAVENOUS
  Filled 2020-12-07: qty 2

## 2020-12-07 MED ORDER — ROCURONIUM BROMIDE 10 MG/ML (PF) SYRINGE
PREFILLED_SYRINGE | INTRAVENOUS | Status: DC | PRN
  Administered 2020-12-07: 80 mg via INTRAVENOUS

## 2020-12-07 MED ORDER — KCL IN DEXTROSE-NACL 20-5-0.45 MEQ/L-%-% IV SOLN
INTRAVENOUS | Status: DC
  Filled 2020-12-07 (×4): qty 1000

## 2020-12-07 MED ORDER — METOPROLOL TARTRATE 5 MG/5ML IV SOLN: 5.0000 mg | Freq: Four times a day (QID) | INTRAVENOUS | Status: DC | PRN

## 2020-12-07 MED ORDER — FENTANYL CITRATE (PF) 100 MCG/2ML IJ SOLN
INTRAMUSCULAR | Status: DC | PRN
  Administered 2020-12-07 (×2): 50 ug via INTRAVENOUS

## 2020-12-07 MED ORDER — HYDROMORPHONE HCL 1 MG/ML IJ SOLN
0.2500 mg | INTRAMUSCULAR | Status: DC | PRN
  Administered 2020-12-07 (×2): 0.5 mg via INTRAVENOUS

## 2020-12-07 MED ORDER — OXYCODONE HCL 5 MG/5ML PO SOLN
5.0000 mg | Freq: Four times a day (QID) | ORAL | Status: DC | PRN
  Filled 2020-12-07: qty 5

## 2020-12-07 MED ORDER — ONDANSETRON HCL 4 MG/2ML IJ SOLN
4.0000 mg | Freq: Once | INTRAMUSCULAR | Status: AC | PRN
  Administered 2020-12-07: 4 mg via INTRAVENOUS

## 2020-12-07 MED ORDER — PROPOFOL 10 MG/ML IV BOLUS
INTRAVENOUS | Status: DC | PRN
  Administered 2020-12-07: 150 mg via INTRAVENOUS

## 2020-12-07 MED ORDER — ACETAMINOPHEN 160 MG/5ML PO SOLN: 1000.0000 mg | Freq: Three times a day (TID) | ORAL | Status: DC

## 2020-12-07 MED ORDER — MIDAZOLAM HCL 2 MG/2ML IJ SOLN
INTRAMUSCULAR | Status: DC | PRN
  Administered 2020-12-07: 2 mg via INTRAVENOUS

## 2020-12-07 MED ORDER — LACTATED RINGERS IV SOLN: INTRAVENOUS | Status: DC

## 2020-12-07 MED ORDER — ONDANSETRON HCL 4 MG/2ML IJ SOLN
INTRAMUSCULAR | Status: AC
  Filled 2020-12-07: qty 2

## 2020-12-07 MED ORDER — HYDRALAZINE HCL 20 MG/ML IJ SOLN
INTRAMUSCULAR | Status: AC
  Filled 2020-12-07: qty 1

## 2020-12-07 MED ORDER — AMISULPRIDE (ANTIEMETIC) 5 MG/2ML IV SOLN: 10.0000 mg | Freq: Once | INTRAVENOUS | Status: DC | PRN

## 2020-12-07 MED ORDER — METOPROLOL SUCCINATE ER 25 MG PO TB24
25.0000 mg | ORAL_TABLET | Freq: Every day | ORAL | Status: DC
  Administered 2020-12-08 – 2020-12-09 (×2): 25 mg via ORAL
  Filled 2020-12-07 (×2): qty 1

## 2020-12-07 MED ORDER — APREPITANT 40 MG PO CAPS
40.0000 mg | ORAL_CAPSULE | ORAL | Status: AC
  Administered 2020-12-07: 40 mg via ORAL
  Filled 2020-12-07: qty 1

## 2020-12-07 MED ORDER — LIDOCAINE 2% (20 MG/ML) 5 ML SYRINGE
INTRAMUSCULAR | Status: DC | PRN
  Administered 2020-12-07: 60 mg via INTRAVENOUS

## 2020-12-07 MED ORDER — SUGAMMADEX SODIUM 200 MG/2ML IV SOLN
INTRAVENOUS | Status: DC | PRN
  Administered 2020-12-07: 450 mg via INTRAVENOUS

## 2020-12-07 MED ORDER — ACETAMINOPHEN 500 MG PO TABS
1000.0000 mg | ORAL_TABLET | Freq: Three times a day (TID) | ORAL | Status: DC
  Administered 2020-12-07 – 2020-12-08 (×3): 1000 mg via ORAL
  Filled 2020-12-07 (×3): qty 2

## 2020-12-07 MED ORDER — BUPIVACAINE LIPOSOME 1.3 % IJ SUSP
INTRAMUSCULAR | Status: DC | PRN
  Administered 2020-12-07: 20 mL

## 2020-12-07 MED ORDER — GABAPENTIN 300 MG PO CAPS
300.0000 mg | ORAL_CAPSULE | ORAL | Status: DC
  Filled 2020-12-07: qty 1

## 2020-12-07 MED ORDER — SODIUM CHLORIDE (PF) 0.9 % IJ SOLN
INTRAMUSCULAR | Status: DC | PRN
  Administered 2020-12-07: 10 mL

## 2020-12-07 MED ORDER — SODIUM CHLORIDE (PF) 0.9 % IJ SOLN
INTRAMUSCULAR | Status: AC
  Filled 2020-12-07: qty 10

## 2020-12-07 MED ORDER — UMECLIDINIUM BROMIDE 62.5 MCG/INH IN AEPB
1.0000 | INHALATION_SPRAY | Freq: Every day | RESPIRATORY_TRACT | Status: DC
  Filled 2020-12-07: qty 7

## 2020-12-07 MED ORDER — LIDOCAINE 2% (20 MG/ML) 5 ML SYRINGE
INTRAMUSCULAR | Status: DC | PRN
  Administered 2020-12-07: 1.5 mg/kg/h via INTRAVENOUS

## 2020-12-07 MED ORDER — ALBUTEROL SULFATE (2.5 MG/3ML) 0.083% IN NEBU: 3.0000 mL | INHALATION_SOLUTION | Freq: Four times a day (QID) | RESPIRATORY_TRACT | Status: DC | PRN

## 2020-12-07 MED ORDER — DEXAMETHASONE SODIUM PHOSPHATE 10 MG/ML IJ SOLN
INTRAMUSCULAR | Status: DC | PRN
  Administered 2020-12-07: 4 mg via INTRAVENOUS

## 2020-12-07 MED ORDER — LACTATED RINGERS IR SOLN
Status: DC | PRN
  Administered 2020-12-07: 1000 mL

## 2020-12-07 MED ORDER — FENTANYL CITRATE (PF) 100 MCG/2ML IJ SOLN: 12.5000 ug | INTRAMUSCULAR | Status: DC | PRN

## 2020-12-07 MED ORDER — ENSURE MAX PROTEIN PO LIQD
2.0000 [oz_av] | ORAL | Status: DC
  Administered 2020-12-08 (×2): 2 [oz_av] via ORAL

## 2020-12-07 MED ORDER — HYDROMORPHONE HCL 1 MG/ML IJ SOLN
INTRAMUSCULAR | Status: AC
  Filled 2020-12-07: qty 1

## 2020-12-07 MED ORDER — FLUTICASONE PROPIONATE 50 MCG/ACT NA SUSP
2.0000 | Freq: Every day | NASAL | Status: DC
  Administered 2020-12-09: 2 via NASAL
  Filled 2020-12-07: qty 16

## 2020-12-07 MED ORDER — MIDAZOLAM HCL 2 MG/2ML IJ SOLN
INTRAMUSCULAR | Status: AC
  Filled 2020-12-07: qty 2

## 2020-12-07 MED ORDER — DEXAMETHASONE SODIUM PHOSPHATE 10 MG/ML IJ SOLN
INTRAMUSCULAR | Status: AC
  Filled 2020-12-07: qty 1

## 2020-12-07 MED ORDER — LIDOCAINE HCL 2 % IJ SOLN
INTRAMUSCULAR | Status: AC
  Filled 2020-12-07: qty 20

## 2020-12-07 MED ORDER — FENTANYL CITRATE (PF) 100 MCG/2ML IJ SOLN
INTRAMUSCULAR | Status: AC
  Filled 2020-12-07: qty 2

## 2020-12-07 MED ORDER — ONDANSETRON HCL 4 MG/2ML IJ SOLN: 4.0000 mg | INTRAMUSCULAR | Status: DC | PRN

## 2020-12-07 MED ORDER — CHLORHEXIDINE GLUCONATE CLOTH 2 % EX PADS: 6.0000 | MEDICATED_PAD | Freq: Once | CUTANEOUS | Status: DC

## 2020-12-07 MED ORDER — FLUTICASONE-UMECLIDIN-VILANT 100-62.5-25 MCG/INH IN AEPB: 1.0000 | INHALATION_SPRAY | Freq: Every day | RESPIRATORY_TRACT | Status: DC

## 2020-12-07 MED ORDER — METOPROLOL TARTRATE 5 MG/5ML IV SOLN
INTRAVENOUS | Status: AC
  Filled 2020-12-07: qty 5

## 2020-12-07 MED ORDER — ONDANSETRON HCL 4 MG/2ML IJ SOLN
INTRAMUSCULAR | Status: DC | PRN
  Administered 2020-12-07: 4 mg via INTRAVENOUS

## 2020-12-07 MED ORDER — FLUTICASONE FUROATE-VILANTEROL 100-25 MCG/INH IN AEPB
1.0000 | INHALATION_SPRAY | Freq: Every day | RESPIRATORY_TRACT | Status: DC
  Filled 2020-12-07: qty 28

## 2020-12-07 MED ORDER — ORAL CARE MOUTH RINSE: 15.0000 mL | Freq: Once | OROMUCOSAL | Status: AC

## 2020-12-07 SURGICAL SUPPLY — 62 items
APPLICATOR COTTON TIP 6 STRL (MISCELLANEOUS) IMPLANT
APPLICATOR COTTON TIP 6IN STRL (MISCELLANEOUS)
APPLIER CLIP 5 13 M/L LIGAMAX5 (MISCELLANEOUS)
APPLIER CLIP ROT 10 11.4 M/L (STAPLE)
APPLIER CLIP ROT 13.4 12 LRG (CLIP)
BAG LAPAROSCOPIC 12 15 PORT 16 (BASKET) ×1 IMPLANT
BAG RETRIEVAL 12/15 (BASKET) ×2
BLADE SURG 15 STRL LF DISP TIS (BLADE) ×1 IMPLANT
BLADE SURG 15 STRL SS (BLADE) ×1
CABLE HIGH FREQUENCY MONO STRZ (ELECTRODE) ×2 IMPLANT
CLIP APPLIE 5 13 M/L LIGAMAX5 (MISCELLANEOUS) IMPLANT
CLIP APPLIE ROT 10 11.4 M/L (STAPLE) IMPLANT
CLIP APPLIE ROT 13.4 12 LRG (CLIP) IMPLANT
COVER WAND RF STERILE (DRAPES) IMPLANT
DECANTER SPIKE VIAL GLASS SM (MISCELLANEOUS) ×2 IMPLANT
DERMABOND ADVANCED (GAUZE/BANDAGES/DRESSINGS) ×1
DERMABOND ADVANCED .7 DNX12 (GAUZE/BANDAGES/DRESSINGS) ×1 IMPLANT
DEVICE SUT QUICK LOAD TK 5 (STAPLE) IMPLANT
DEVICE SUT TI-KNOT TK 5X26 (MISCELLANEOUS) IMPLANT
DEVICE SUTURE ENDOST 10MM (ENDOMECHANICALS) IMPLANT
DISSECTOR BLUNT TIP ENDO 5MM (MISCELLANEOUS) IMPLANT
ELECT REM PT RETURN 15FT ADLT (MISCELLANEOUS) ×2 IMPLANT
GAUZE SPONGE 4X4 12PLY STRL (GAUZE/BANDAGES/DRESSINGS) IMPLANT
GLOVE BIOGEL M 8.0 STRL (GLOVE) ×2 IMPLANT
GOWN STRL REUS W/TWL XL LVL3 (GOWN DISPOSABLE) ×8 IMPLANT
GRASPER SUT TROCAR 14GX15 (MISCELLANEOUS) ×2 IMPLANT
HANDLE STAPLE EGIA 4 XL (STAPLE) ×2 IMPLANT
KIT BASIN OR (CUSTOM PROCEDURE TRAY) ×2 IMPLANT
KIT TURNOVER KIT A (KITS) ×2 IMPLANT
MARKER SKIN DUAL TIP RULER LAB (MISCELLANEOUS) ×2 IMPLANT
MAT PREVALON FULL STRYKER (MISCELLANEOUS) ×2 IMPLANT
NEEDLE SPNL 22GX3.5 QUINCKE BK (NEEDLE) ×2 IMPLANT
PACK CARDIOVASCULAR III (CUSTOM PROCEDURE TRAY) ×2 IMPLANT
RELOAD TRI 45 ART MED THCK BLK (STAPLE) ×2 IMPLANT
RELOAD TRI 45 ART MED THCK PUR (STAPLE) ×2 IMPLANT
RELOAD TRI 60 ART MED THCK BLK (STAPLE) ×2 IMPLANT
RELOAD TRI 60 ART MED THCK PUR (STAPLE) ×6 IMPLANT
SCISSORS LAP 5X45 EPIX DISP (ENDOMECHANICALS) IMPLANT
SET IRRIG TUBING LAPAROSCOPIC (IRRIGATION / IRRIGATOR) ×2 IMPLANT
SET TUBE SMOKE EVAC HIGH FLOW (TUBING) ×2 IMPLANT
SHEARS HARMONIC ACE PLUS 45CM (MISCELLANEOUS) ×2 IMPLANT
SLEEVE ADV FIXATION 5X100MM (TROCAR) ×4 IMPLANT
SLEEVE GASTRECTOMY 36FR VISIGI (MISCELLANEOUS) ×2 IMPLANT
SOL ANTI FOG 6CC (MISCELLANEOUS) ×1 IMPLANT
SOLUTION ANTI FOG 6CC (MISCELLANEOUS) ×1
SPONGE LAP 18X18 RF (DISPOSABLE) ×2 IMPLANT
STAPLER VISISTAT 35W (STAPLE) IMPLANT
SUT MNCRL AB 4-0 PS2 18 (SUTURE) ×4 IMPLANT
SUT SURGIDAC NAB ES-9 0 48 120 (SUTURE) IMPLANT
SUT VICRYL 0 TIES 12 18 (SUTURE) ×2 IMPLANT
SYR 10ML ECCENTRIC (SYRINGE) ×2 IMPLANT
SYR 20ML LL LF (SYRINGE) ×2 IMPLANT
SYR 50ML LL SCALE MARK (SYRINGE) ×2 IMPLANT
TOWEL OR 17X26 10 PK STRL BLUE (TOWEL DISPOSABLE) ×2 IMPLANT
TOWEL OR NON WOVEN STRL DISP B (DISPOSABLE) ×2 IMPLANT
TRAY FOLEY MTR SLVR 16FR STAT (SET/KITS/TRAYS/PACK) IMPLANT
TROCAR ADV FIXATION 5X100MM (TROCAR) ×2 IMPLANT
TROCAR BLADELESS 15MM (ENDOMECHANICALS) ×2 IMPLANT
TROCAR BLADELESS OPT 5 100 (ENDOMECHANICALS) ×2 IMPLANT
TUBE CALIBRATION LAPBAND (TUBING) IMPLANT
TUBING CONNECTING 10 (TUBING) ×4 IMPLANT
TUBING ENDO SMARTCAP (MISCELLANEOUS) ×2 IMPLANT

## 2020-12-07 NOTE — Progress Notes (Signed)
Discussed post op day goals with patient including ambulation, IS, diet progression, pain, and nausea control.  BSTOP education provided including BSTOP information guide, "Guide for Pain Management after your Bariatric Procedure".  Questions answered. 

## 2020-12-07 NOTE — Transfer of Care (Signed)
Immediate Anesthesia Transfer of Care Note  Patient: Michelle Pitts  Procedure(s) Performed: LAPAROSCOPIC GASTRIC SLEEVE RESECTION (N/A ) UPPER GI ENDOSCOPY (N/A )  Patient Location: PACU  Anesthesia Type:General  Level of Consciousness: awake  Airway & Oxygen Therapy: Patient Spontanous Breathing  Post-op Assessment: Report given to RN and Post -op Vital signs reviewed and stable  Post vital signs: Reviewed and stable  Last Vitals:  Vitals Value Taken Time  BP 172/98 12/07/20 0913  Temp    Pulse 71 12/07/20 0914  Resp 17 12/07/20 0914  SpO2 91 % 12/07/20 0914  Vitals shown include unvalidated device data.  Last Pain:  Vitals:   12/07/20 0613  TempSrc:   PainSc: 0-No pain         Complications: No complications documented.

## 2020-12-07 NOTE — Discharge Instructions (Signed)
GASTRIC BYPASS / SLEEVE  Home Care Instructions  These instructions are to help you care for yourself when you go home.  Call: If you have any problems. . Call [phone] and ask for the surgeon on call . If you have an emergency related to your surgery please use the ER at [place].  . Tell the ER staff that you are a new post-op gastric bypass or gastric sleeve patient   Signs and symptoms to report: . Severe vomiting or nausea o If you cannot handle clear liquids for longer than 1 day, call your surgeon  . Abdominal pain which does not get better after taking your pain medication . Fever greater than 100.4 F and chills . Heart rate over 100 beats a minute . Trouble breathing . Chest pain .  Redness, swelling, drainage, or foul odor at incision (surgical) sites .  If your incisions open or pull apart . Swelling or pain in calf (lower leg) . Diarrhea (Loose bowel movements that happen often), frequent watery, uncontrolled bowel movements . Constipation, (no bowel movements for 3 days) if this happens:  o Take Milk of Magnesia, 2 tablespoons by mouth, 3 times a day for 2 days if needed o Stop taking Milk of Magnesia once you have had a bowel movement o Call your doctor if constipation continues Or o Take Miralax  (instead of Milk of Magnesia) following the label instructions o Stop taking Miralax once you have had a bowel movement o Call your doctor if constipation continues . Anything you think is "abnormal for you"   Normal side effects after surgery: . Unable to sleep at night or unable to concentrate . Irritability . Being tearful (crying) or depressed These are common complaints, possibly related to your anesthesia, stress of surgery and change in lifestyle, that usually go away a few weeks after surgery.  If these feelings continue, call your medical doctor.  Wound Care: You may have surgical glue, steri-strips, or staples over your incisions after surgery . Surgical  glue:  Looks like a clear film over your incisions and will wear off a little at a time . Steri-strips : Adhesive strips of tape over your incisions. You may notice a yellowish color on the skin under the steri-strips. This is used to make the   steri-strips stick better. Do not pull the steri-strips off - let them fall off . Staples: Staples may be removed before you leave the hospital o If you go home with staples, call [facility] at for an appointment with your surgeon's nurse to have staples removed 10 days after surgery, [phone] . Showering: You may shower two (2) days after your surgery unless your surgeon tells you differently o Wash gently around incisions with warm soapy water, rinse well, and gently pat dry  o If you have a drain (tube from your incision), you may need someone to hold this while you shower  o No tub baths until staples are removed and incisions are healed     Medications: . Medications should be liquid or crushed if larger than the size of a dime . Extended release pills (medication that releases a little bit at a time through the day) should not be crushed . Depending on the size and number of medications you take, you may need to space (take a few throughout the day)/change the time you take your medications so that you do not over-fill your pouch (smaller stomach) . Make sure you follow-up with   your primary care physician to make medication changes needed during rapid weight loss and life-style changes . If you have diabetes, follow up with the doctor that orders your diabetes medication(s) within one week after surgery and check your blood sugar regularly. . Do not drive while taking narcotics (pain medications) . DO NOT take NSAID'S (Examples of NSAID's include ibuprofen, naproxen)  Diet:                    First 2 Weeks  You will see the nutritionist about two (2) weeks after your surgery. The nutritionist will increase the types of foods you can  eat if you are handling liquids well: . If you have severe vomiting or nausea and cannot handle clear liquids lasting longer than 1 day, call your surgeon  Protein Shake . Drink at least 2 ounces of shake 5-6 times per day . Each serving of protein shakes (usually 8 - 12 ounces) should have a minimum of:  o 15 grams of protein  o And no more than 5 grams of carbohydrate  . Goal for protein each day: o Men = 80 grams per day o Women = 60 grams per day . Protein powder may be added to fluids such as non-fat milk or Lactaid milk or Soy milk (limit to 35 grams added protein powder per serving)  Hydration . Slowly increase the amount of water and other clear liquids as tolerated (See Acceptable Fluids) . Slowly increase the amount of protein shake as tolerated  .  Sip fluids slowly and throughout the day . May use sugar substitutes in small amounts (no more than 6 - 8 packets per day; i.e. Splenda)  Fluid Goal . The first goal is to drink at least 8 ounces of protein shake/drink per day (or as directed by the nutritionist);  See handout from pre-op Bariatric Education Class for examples of protein shake/drink.   o Slowly increase the amount of protein shake you drink as tolerated o You may find it easier to slowly sip shakes throughout the day o It is important to get your proteins in first . Your fluid goal is to drink 64 - 100 ounces of fluid daily o It may take a few weeks to build up to this . 32 oz (or more) should be clear liquids  And  . 32 oz (or more) should be full liquids (see below for examples) . Liquids should not contain sugar, caffeine, or carbonation  Clear Liquids: . Water or Sugar-free flavored water (i.e. Fruit H2O, Propel) . Decaffeinated coffee or tea (sugar-free) . Shruthi Northrup Lite, Wyler's Lite, Minute Maid Lite . Sugar-free Jell-O . Bouillon or broth . Sugar-free Popsicle:   *Less than 20 calories each; Limit 1 per day  Full Liquids: Protein Shakes/Drinks + 2  choices per day of other full liquids . Full liquids must be: o No More Than 12 grams of Carbs per serving  o No More Than 3 grams of Fat per serving . Strained low-fat cream soup . Non-Fat milk . Fat-free Lactaid Milk . Sugar-free yogurt (Dannon Lite & Fit, Greek yogurt)      Vitamins and Minerals . Start 1 day after surgery unless otherwise directed by your surgeon . Bariatric Specific Complete Multivitamins . Chewable Calcium Citrate with Vitamin D-3 (Example: 3 Chewable Calcium Plus 600 with Vitamin D-3) o Take 500 mg three (3) times a day for a total of 1500 mg each day o Do not take all 3 doses   of calcium at one time as it may cause constipation, and you can only absorb 500 mg  at a time  o Do not mix multivitamins containing iron with calcium supplements; take 2 hours apart  . Menstruating women and those at risk for anemia (a blood disease that causes weakness) may need extra iron o Talk with your doctor to see if you need more iron . If you need extra iron: Total daily Iron recommendation (including Vitamins) is 50 to 100 mg Iron/day . Do not stop taking or change any vitamins or minerals until you talk to your nutritionist or surgeon . Your nutritionist and/or surgeon must approve all vitamin and mineral supplements   Activity and Exercise: It is important to continue walking at home.  Limit your physical activity as instructed by your doctor.  During this time, use these guidelines: . Do not lift anything greater than ten (10) pounds for at least two (2) weeks . Do not go back to work or drive until your surgeon says you can . You may have sex when you feel comfortable  o It is VERY important for female patients to use a reliable birth control method; fertility often increases after surgery  o Do not get pregnant for at least 18 months . Start exercising as soon as your doctor tells you that you can o Make sure your doctor approves any physical activity . Start with a  simple walking program . Walk 5-15 minutes each day, 7 days per week.  . Slowly increase until you are walking 30-45 minutes per day Consider joining our BELT program. [phone] or email [email or web address]   Special Instructions Things to remember:  . Use your CPAP when sleeping if this applies to you, do not stop the use of CPAP unless directed by physician after a sleep study . [facility] has a free Bariatric Surgery Support Group that meets monthly, the 3rd Thursday, 6 pm.  Please review discharge information for date and location of this meeting. . It is very important to keep all follow up appointments with your surgeon, nutritionist, primary care physician, and behavioral health practitioner o After the first year, please follow up with your bariatric surgeon and nutritionist at least once a year in order to maintain best weight loss results   [facility]: [phone] [facility]: [phone] Bariatric Nurse Coordinator: [phone]      

## 2020-12-07 NOTE — Progress Notes (Signed)
PHARMACY CONSULT FOR:  Risk Assessment for Post-Discharge VTE Following Bariatric Surgery  Post-Discharge VTE Risk Assessment: This patient's probability of 30-day post-discharge VTE is increased due to the factors marked:   Female   x Age >/=60 years    BMI >/=50 kg/m2    CHF    Dyspnea at Rest    Paraplegia   x Non-gastric-band surgery    Operation Time >/=3 hr    Return to OR     Length of Stay >/= 3 d   Hx of VTE   Hypercoagulable condition   Significant venous stasis       Predicted probability of 30-day post-discharge VTE: 0.31% using [facility] calculator  Other patient-specific factors to consider:   Recommendation for Discharge: No pharmacologic prophylaxis post-discharge .   Michelle Pitts is a 70 y.o. female who underwent laparoscopic gastric sleeve resection on 12/07/20   Case start: 0753 Case end: 0901   Allergies  Allergen Reactions  . Codeine Nausea And Vomiting  . Gramineae Pollens     sinus  . Hydrochlorothiazide Swelling and Other (See Comments)    Swelling of tongue.  Mack Hook [Levofloxacin In D5w] Hives, Swelling and Other (See Comments)    Leg swelling     Patient Measurements: Height: 5' 4.5" (163.8 cm) Weight: 120.8 kg (266 lb 6.4 oz) IBW/kg (Calculated) : 55.85 Body mass index is 45.02 kg/m.  Recent Labs    12/07/20 0630  CREATININE 0.89  ALBUMIN 4.2  PROT 7.5  AST 44*  ALT 50*  ALKPHOS 121  BILITOT 1.2   Estimated Creatinine Clearance: 75 mL/min (by C-G formula based on SCr of 0.89 mg/dL).    Past Medical History:  Diagnosis Date  . Anemia   . Anxiety   . Breast cancer ([place]) 01/23/2014   Left breast, T1c, N0, M0. ER/PR +; Her 2 neu not overexpressed. Oncotype DX: 13,Low risk.9% over 10 years.   . Cancer ([place]) 1995   vulvar, [facility]  . COPD (chronic obstructive pulmonary disease) (HCC)    bronchitis  . Dyspnea    with exertion  . Dysrhythmia   . GERD (gastroesophageal reflux disease)   . History of  colon polyps   . Hypercholesterolemia   . Hypertension   . Hypothyroidism   . Insomnia   . Lung cancer ([place])   . 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 Personal history of radiation therapy   . Personal history of tobacco use, presenting hazards to health 11/18/2015  . Sleep apnea    NO CPAP  . Sleep apnea   . SOB (shortness of breath) on exertion   . Spinal stenosis   . Thyroid disease   . Vitamin D deficiency      Medications Prior to Admission  Medication Sig Dispense Refill Last Dose  . albuterol (VENTOLIN HFA) 108 (90 Base) MCG/ACT inhaler Inhale 1-2 puffs into the lungs every 6 (six) hours as needed for wheezing or shortness of breath.   12/07/2020 at 0300  . Cholecalciferol (VITAMIN D3) 50 MCG (2000 UT) TABS Take 2,000 Units by mouth daily.   12/06/2020 at Unknown time  . ezetimibe (ZETIA) 10 MG tablet Take 1 tablet (10 mg total) by mouth daily. 30 tablet 11 12/06/2020 at Unknown time  . fluticasone (FLONASE) 50 MCG/ACT nasal spray Place 2 sprays into both nostrils daily. 16 g 6 12/07/2020 at 0300  . Fluticasone-Umeclidin-Vilant (TRELEGY ELLIPTA) 100-62.5-25 MCG/INH AEPB Inhale 1 puff into the lungs daily.   12/07/2020 at  0300  . gabapentin (NEURONTIN) 300 MG capsule Take 1 capsule (300 mg total) by mouth 2 (two) times daily as needed. For pain (Patient taking differently: Take 300 mg by mouth in the morning.) 180 capsule 1 12/07/2020 at 0300  . ibuprofen (ADVIL) 600 MG tablet TAKE 1 TABLET(600 MG) BY MOUTH EVERY 8 HOURS AS NEEDED (Patient taking differently: Take 600 mg by mouth every 8 (eight) hours as needed for moderate pain.) 90 tablet 1 11/23/2020 at Unknown time  . metoprolol succinate (TOPROL-XL) 25 MG 24 hr tablet Take 1 tablet (25 mg total) by mouth daily. 90 tablet 3 12/07/2020 at 0300  . ALPRAZolam (XANAX) 0.5 MG tablet Take 1 tablet (0.5 mg total) by mouth at bedtime as needed for anxiety. 30 tablet 5 More than a month at Unknown time       Joycelyn Rua 12/07/2020,9:29  AM

## 2020-12-07 NOTE — Anesthesia Postprocedure Evaluation (Signed)
Anesthesia Post Note  Patient: Michelle Pitts  Procedure(s) Performed: LAPAROSCOPIC GASTRIC SLEEVE RESECTION (N/A ) UPPER GI ENDOSCOPY (N/A )     Patient location during evaluation: PACU Anesthesia Type: General Level of consciousness: awake and alert Pain management: pain level controlled Vital Signs Assessment: post-procedure vital signs reviewed and stable Respiratory status: spontaneous breathing, nonlabored ventilation, respiratory function stable and patient connected to nasal cannula oxygen Cardiovascular status: blood pressure returned to baseline and stable Postop Assessment: no apparent nausea or vomiting Anesthetic complications: no   No complications documented.  Last Vitals:  Vitals:   12/07/20 1020 12/07/20 1046  BP: (!) 178/85 (!) 167/79  Pulse: 73 79  Resp: 18 (!) 8  Temp:    SpO2: 97% 99%    Last Pain:  Vitals:   12/07/20 1015  TempSrc:   PainSc: Asleep                 [name] S

## 2020-12-07 NOTE — Interval H&P Note (Signed)
History and Physical Interval Note:  12/07/2020 7:21 AM  Michelle Pitts  has presented today for surgery, with the diagnosis of MORBID OBESITY.  The various methods of treatment have been discussed with the patient and family. After consideration of risks, benefits and other options for treatment, the patient has consented to  Procedure(s): LAPAROSCOPIC GASTRIC SLEEVE RESECTION (N/A) UPPER GI ENDOSCOPY (N/A) as a surgical intervention.  The patient's history has been reviewed, patient examined, no change in status, stable for surgery.  I have reviewed the patient's chart and labs.  Questions were answered to the patient's satisfaction.     [name]

## 2020-12-07 NOTE — Op Note (Signed)
07 December 2020  Surgeon: Kaylyn Lim, MD, FACS  Asst:  Gurney Maxin, MD, FACS  Anes:  General endotracheal  Procedure: Laparoscopic sleeve gastrectomy and upper endoscopy  Diagnosis: Morbid obesity  Complications: None noted  EBL:   minimal cc  Description of Procedure:  The patient was take to OR 1 and given general anesthesia.  The abdomen was prepped with Chloroprep and draped sterilely.  A timeout was performed.  Access to the abdomen was achieved with a 5 mm Optiview through the left upper quadrant.  Following insufflation, the state of the abdomen was found to be free of adhesions and no metastatic disease was seen.  The patient has a past history of vulvar, breast and lung cancer.  The ViSiGi 36Fr tube was inserted to deflate the stomach and was pulled back into the esophagus.    The pylorus was identified and we measured 5 cm back and marked the antrum.  At that point we began dissection to take down the greater curvature of the stomach using the Harmonic scalpel.  This dissection was taken all the way up to the left crus. No hiatal hernia was observed and the UGI was normal.   Posterior attachments of the stomach were also taken down.    The ViSiGi tube was then passed into the antrum and suction applied so that it was snug along the lessor curvature.  The "crow's foot" or incisura was identified.  The sleeve gastrectomy was begun using the [employer] stapler beginning with a 4.5 black load with tRS and then a 6 cm black load with TRS and then purple 6 cm loads with TRS.  When the sleeve was complete the tube was taken off suction and insufflated briefly.  The tube was withdrawn.  Upper endoscopy was then performed by Dr. Kieth Brightly.     The specimen was extracted through the 15 trocar site using the bag technique.  Local was provided by infiltrating with 30 cc Exparel (diluted) placed at the beginning as a TAP block and closed 4-0 Monocryl and Dermabond.    Matt B.  Hassell Done, [place], [facility], [place]

## 2020-12-07 NOTE — Anesthesia Procedure Notes (Signed)
Procedure Name: Intubation Date/Time: 12/07/2020 7:36 AM Performed by: Niel Hummer, CRNA Pre-anesthesia Checklist: Patient identified, Emergency Drugs available, Suction available and Patient being monitored Patient Re-evaluated:Patient Re-evaluated prior to induction Oxygen Delivery Method: Circle system utilized Preoxygenation: Pre-oxygenation with 100% oxygen Induction Type: IV induction Ventilation: Mask ventilation without difficulty and Oral airway inserted - appropriate to patient size Laryngoscope Size: Mac and 4 Grade View: Grade I Tube type: Oral Tube size: 7.0 mm Number of attempts: 1 Airway Equipment and Method: Stylet Placement Confirmation: ETT inserted through vocal cords under direct vision and positive ETCO2 Secured at: 21 cm Tube secured with: Tape Dental Injury: Teeth and Oropharynx as per pre-operative assessment

## 2020-12-07 NOTE — Op Note (Signed)
Preoperative diagnosis: laparoscopic sleeve gastrectomy  Postoperative diagnosis: Same   Procedure: Upper endoscopy   Surgeon: Vernisha Bacote, M.D.  Anesthesia: Gen.   Indications for procedure: This patient was undergoing a laparoscopic sleeve gastrectomy.   Description of procedure: The endoscopy was placed in the mouth and into the oropharynx and under endoscopic vision it was advanced to the esophagogastric junction. The pouch was insufflated and no bleeding or bubbles were seen. The GEJ was identified at 36cm from the teeth. No bleeding or leaks were detected. The scope was withdrawn without difficulty.   Avalynn Bowe, M.D. General, Bariatric, & Minimally Invasive Surgery [facility]    

## 2020-12-07 NOTE — Progress Notes (Signed)
Started water at [employer]

## 2020-12-08 ENCOUNTER — Other Ambulatory Visit (HOSPITAL_COMMUNITY): Payer: Self-pay

## 2020-12-08 ENCOUNTER — Encounter (HOSPITAL_COMMUNITY): Payer: Self-pay | Admitting: Surgery

## 2020-12-08 LAB — CBC WITH DIFFERENTIAL/PLATELET
Abs Immature Granulocytes: 0.03 10*3/uL (ref 0.00–0.07)
Basophils Absolute: 0 10*3/uL (ref 0.0–0.1)
Basophils Relative: 0 %
Eosinophils Absolute: 0 10*3/uL (ref 0.0–0.5)
Eosinophils Relative: 0 %
HCT: 36.3 % (ref 36.0–46.0)
Hemoglobin: 11.6 g/dL — ABNORMAL LOW (ref 12.0–15.0)
Immature Granulocytes: 0 %
Lymphocytes Relative: 10 %
Lymphs Abs: 0.8 10*3/uL (ref 0.7–4.0)
MCH: 31.4 pg (ref 26.0–34.0)
MCHC: 32 g/dL (ref 30.0–36.0)
MCV: 98.4 fL (ref 80.0–100.0)
Monocytes Absolute: 0.5 10*3/uL (ref 0.1–1.0)
Monocytes Relative: 7 %
Neutro Abs: 5.9 10*3/uL (ref 1.7–7.7)
Neutrophils Relative %: 83 %
Platelets: 216 10*3/uL (ref 150–400)
RBC: 3.69 MIL/uL — ABNORMAL LOW (ref 3.87–5.11)
RDW: 12.9 % (ref 11.5–15.5)
WBC: 7.2 10*3/uL (ref 4.0–10.5)
nRBC: 0 % (ref 0.0–0.2)

## 2020-12-08 MED ORDER — ALPRAZOLAM 0.5 MG PO TABS: 0.5000 mg | ORAL_TABLET | Freq: Two times a day (BID) | ORAL | Status: DC | PRN

## 2020-12-08 MED ORDER — GABAPENTIN 300 MG PO CAPS
300.0000 mg | ORAL_CAPSULE | Freq: Three times a day (TID) | ORAL | Status: DC
  Administered 2020-12-08 – 2020-12-09 (×3): 300 mg via ORAL
  Filled 2020-12-08 (×3): qty 1

## 2020-12-08 MED ORDER — ACETAMINOPHEN 160 MG/5ML PO SOLN: 650.0000 mg | Freq: Four times a day (QID) | ORAL | Status: DC | PRN

## 2020-12-08 NOTE — Progress Notes (Signed)
Progress Note: Metabolic and Bariatric Surgery Service   Chief Complaint/Subjective: Abdominal pain, feels like tylenol got stuck earlier today  Objective: Vital signs in last 24 hours: Temp:  [97.4 F (36.3 C)-98.5 F (36.9 C)] 98.3 F (36.8 C) (04/26 0503) Pulse Rate:  [61-102] 99 (04/26 0503) Resp:  [7-23] 18 (04/26 0503) BP: (152-218)/(68-100) 152/68 (04/26 0503) SpO2:  [92 %-100 %] 100 % (04/26 0503)    Intake/Output from previous day: 04/25 0701 - 04/26 0700 In: 2585 [P.O.:540; I.V.:1945; IV Piggyback:100] Out: 2950 [Urine:2900; Blood:50] Intake/Output this shift: Total I/O In: -  Out: 200 [Urine:200]  Lungs: nonlabored  Cardiovascular: RRR  Abd: soft, incisions c/d/i  Extremities: no edema  Neuro: AOx4  Lab Results: CBC  Recent Labs    12/07/20 1337 12/08/20 0442  WBC 9.7 7.2  HGB 13.1 11.6*  HCT 41.3 36.3  PLT 152 216   BMET Recent Labs    12/07/20 0630  NA 141  K 4.6  CL 108  CO2 25  GLUCOSE 113*  BUN 22  CREATININE 0.89  CALCIUM 9.4   PT/INR No results for input(s): LABPROT, INR in the last 72 hours. ABG No results for input(s): PHART, HCO3 in the last 72 hours.  Invalid input(s): PCO2, PO2  Studies/Results:  Anti-infectives: Anti-infectives (From admission, onward)   Start     Dose/Rate Route Frequency Ordered Stop   12/07/20 0600  cefoTEtan (CEFOTAN) 2 g in sodium chloride 0.9 % 100 mL IVPB        2 g 200 mL/hr over 30 Minutes Intravenous On call to O.R. 12/07/20 0516 12/07/20 0807      Medications: Scheduled Meds: . acetaminophen  1,000 mg Oral Q8H   Or  . acetaminophen (TYLENOL) oral liquid 160 mg/5 mL  1,000 mg Oral Q8H  . fluticasone  2 spray Each Nare Daily  . fluticasone furoate-vilanterol  1 puff Inhalation Daily   And  . umeclidinium bromide  1 puff Inhalation Daily  . heparin injection (subcutaneous)  5,000 Units Subcutaneous Q8H  . metoprolol succinate  25 mg Oral Daily  . pantoprazole (PROTONIX) IV  40  mg Intravenous QHS  . Ensure Max Protein  2 oz Oral Q2H   Continuous Infusions: . dextrose 5 % and 0.45 % NaCl with KCl 20 mEq/L 75 mL/hr at 12/07/20 1305   PRN Meds:.albuterol, fentaNYL (SUBLIMAZE) injection, metoprolol tartrate, ondansetron (ZOFRAN) IV, oxyCODONE  Assessment/Plan: Patient Active Problem List   Diagnosis Date Noted  . 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 Primary cancer of left lower lobe of lung ([place]) 11/25/2020  . S/P bilateral oophorectomy 06/27/2018  . Aortic atherosclerosis ([place]) 06/21/2018  . Malignant neoplasm of lung ([place]) 06/21/2018  . Abnormal EKG 06/21/2018  . Coronary artery calcification seen on CT scan 06/21/2018  . Pneumothorax on left 04/18/2018  . Pneumothorax 04/13/2018  . Pneumothorax after biopsy 04/12/2018  . Bilateral leg numbness 11/10/2017  . Obesity, Class III, BMI 40-49.9 (morbid obesity) ([place]) 11/10/2017  . DNR no code (do not resuscitate) 02/08/2016  . Personal history of tobacco use, presenting hazards to health 11/18/2015  . Shortness of breath 07/24/2015  . Malignant neoplasm of lower-inner quadrant of left female breast ([place]) 07/22/2015  . Tachycardia 07/22/2015  . Anxiety 05/15/2015  . Colon polyp 05/15/2015  . CAFL (chronic airflow limitation) ([place]) 05/15/2015  . Elevated blood sugar 05/15/2015  . Abnormal liver enzymes 05/15/2015  . Family history of diabetes mellitus 05/15/2015  . BP (high blood pressure) 05/15/2015  .  Cannot sleep 05/15/2015  . Apnea, sleep 05/15/2015  . Avitaminosis D 05/15/2015  . 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 Anxiety disorder 05/15/2015  . Chronic obstructive pulmonary disease ([place]) 05/15/2015  . Gastro-esophageal reflux disease without esophagitis 05/15/2015  . 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 Adult hypothyroidism 05/15/2015  . Pure hypercholesterolemia 05/15/2015  . 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

## 2020-12-08 NOTE — Progress Notes (Signed)
Nutrition Education Note ° °Received consult for diet education for patient s/p bariatric surgery. ° °Discussed 2 week post op diet with pt. Emphasized that liquids must be non carbonated, non caffeinated, and sugar free. Fluid goals discussed. Pt to follow up with outpatient bariatric RD for further diet progression after 2 weeks. Multivitamins and minerals also reviewed. Teach back method used, pt expressed understanding, expect good compliance. ° °If nutrition issues arise, please consult RD. ° °Ciria Bernardini, MS, RD, LDN °Inpatient Clinical Dietitian °Contact information available via Amion ° ° °

## 2020-12-08 NOTE — Progress Notes (Signed)
Patient alert and oriented, Post op day 1.  Provided support and encouragement.  Encouraged pulmonary toilet, ambulation and small sips of liquids.  All questions answered.  Will continue to monitor. 

## 2020-12-08 NOTE — Progress Notes (Signed)
Patient alert and oriented, pain is controlled. Patient is tolerating fluids, advanced to protein shake today, patient is tolerating well.  Reviewed Gastric sleeve discharge instructions with patient and patient is able to articulate understanding.  Provided information on BELT program, Support Group and [facility] outpatient pharmacy. All questions answered, will continue to monitor.  Pt disclosed that she does not have a PCP.  Pt encouraged to make finding a PCP a priority for primary care follow up.  Pt is on HTN medications that will need monitoring during the recovery process.

## 2020-12-09 ENCOUNTER — Other Ambulatory Visit (HOSPITAL_COMMUNITY): Payer: Self-pay

## 2020-12-09 LAB — CBC WITH DIFFERENTIAL/PLATELET
Abs Immature Granulocytes: 0.03 10*3/uL (ref 0.00–0.07)
Basophils Absolute: 0 10*3/uL (ref 0.0–0.1)
Basophils Relative: 0 %
Eosinophils Absolute: 0 10*3/uL (ref 0.0–0.5)
Eosinophils Relative: 0 %
HCT: 35.4 % — ABNORMAL LOW (ref 36.0–46.0)
Hemoglobin: 11.2 g/dL — ABNORMAL LOW (ref 12.0–15.0)
Immature Granulocytes: 0 %
Lymphocytes Relative: 21 %
Lymphs Abs: 1.5 10*3/uL (ref 0.7–4.0)
MCH: 31.5 pg (ref 26.0–34.0)
MCHC: 31.6 g/dL (ref 30.0–36.0)
MCV: 99.7 fL (ref 80.0–100.0)
Monocytes Absolute: 0.6 10*3/uL (ref 0.1–1.0)
Monocytes Relative: 8 %
Neutro Abs: 5.1 10*3/uL (ref 1.7–7.7)
Neutrophils Relative %: 71 %
Platelets: 189 10*3/uL (ref 150–400)
RBC: 3.55 MIL/uL — ABNORMAL LOW (ref 3.87–5.11)
RDW: 13.2 % (ref 11.5–15.5)
WBC: 7.3 10*3/uL (ref 4.0–10.5)
nRBC: 0 % (ref 0.0–0.2)

## 2020-12-09 MED ORDER — PANTOPRAZOLE SODIUM 40 MG PO TBEC
40.0000 mg | DELAYED_RELEASE_TABLET | Freq: Every day | ORAL | 0 refills | Status: DC
  Filled 2020-12-09: qty 30, 30d supply, fill #0

## 2020-12-09 MED ORDER — ACETAMINOPHEN 500 MG PO TABS: 1000.0000 mg | ORAL_TABLET | Freq: Three times a day (TID) | ORAL | 0 refills | Status: AC

## 2020-12-09 MED ORDER — ONDANSETRON 4 MG PO TBDP
4.0000 mg | ORAL_TABLET | Freq: Four times a day (QID) | ORAL | 0 refills | Status: DC | PRN
  Filled 2020-12-09: qty 20, 10d supply, fill #0

## 2020-12-09 NOTE — Progress Notes (Signed)
Patient was given discharge instructions by Crystal, and all questions were answered.  Patient was stable for discharge and was taken to the main exit by wheelchair.

## 2020-12-09 NOTE — Progress Notes (Signed)
Patient alert and oriented, Post op day 2.  Provided support and encouragement.  Encouraged pulmonary toilet, ambulation and small sips of liquids.  All questions answered.  Will continue to monitor.  24hr fluid recall: 465mL.  Per dehydration protocol, will call pt to f/u within one week post op.

## 2020-12-09 NOTE — Discharge Summary (Signed)
Physician Discharge Summary  Michelle Pitts JJO:[id number] DOB: 07/20/1951 DOA: 12/07/2020  PCP: Mar Daring, PA-C  Admit date: 12/07/2020 Discharge date: 12/09/2020  Recommendations for Outpatient Follow-up:  1.  (include homehealth, outpatient follow-up instructions, specific recommendations for PCP to follow-up on, etc.)   Follow-up Information    Surgery, [place]. Go on 12/30/2020.   Specialty: General Surgery Why: at 9:15am with Dr. Hassell Done.  Please arrive 15 minutes prior to your appointment time.  Thank you. Contact information: [address] [place] [address] [phone]        Carlena Hurl, PA-C. Go on 01/29/2021.   Specialty: General Surgery Why: at 2:00pm for Dr. Hassell Done.  Please arrive 15 minutes prior to your appointment time.  Thank you. Contact information: [place] [address] [phone]              Discharge Diagnoses:  Active Problems:   S/P laparoscopic sleeve gastrectomy   Surgical Procedure: laparoscopic sleeve gastrectomy, upper endoscopy  Discharge Condition: Good Disposition: Home  Diet recommendation: Postoperative sleeve gastrectomy diet (liquids only)  Filed Weights   12/07/20 0550  Weight: 120.8 kg     Hospital Course:  The patient was admitted after undergoing laparoscopic sleeve gastrectomy. POD 0 she ambulated well. POD 1 she was started on the water diet protocol and tolerated 200 ml in the first shift. Once meeting the water amount she was advanced to bariatric protein shakes which they tolerated and were discharged home POD 1.  Treatments: surgery: laparoscopic sleeve gastrectomy  Discharge Instructions  Discharge Instructions    Ambulate hourly while awake   Complete by: As directed    Call MD for:  difficulty breathing, headache or visual disturbances   Complete by: As directed    Call MD for:  persistant dizziness or light-headedness   Complete by: As  directed    Call MD for:  persistant nausea and vomiting   Complete by: As directed    Call MD for:  redness, tenderness, or signs of infection (pain, swelling, redness, odor or green/yellow discharge around incision site)   Complete by: As directed    Call MD for:  severe uncontrolled pain   Complete by: As directed    Call MD for:  temperature >101 F   Complete by: As directed    Diet bariatric full liquid   Complete by: As directed    Discharge wound care:   Complete by: As directed    Remove Bandaids tomorrow, ok to shower tomorrow. Steristrips may fall off in 1-3 weeks.   Incentive spirometry   Complete by: As directed    Perform hourly while awake     Allergies as of 12/09/2020      Reactions   Codeine Nausea And Vomiting   Gramineae Pollens    sinus   Hydrochlorothiazide Swelling, Other (See Comments)   Swelling of tongue.   Levaquin [levofloxacin In D5w] Hives, Swelling, Other (See Comments)   Leg swelling      Medication List    STOP taking these medications   ibuprofen 600 MG tablet Commonly known as: ADVIL     TAKE these medications   acetaminophen 500 MG tablet Commonly known as: TYLENOL Take 2 tablets (1,000 mg total) by mouth every 8 (eight) hours for 5 days.   albuterol 108 (90 Base) MCG/ACT inhaler Commonly known as: VENTOLIN HFA Inhale 1-2 puffs into the lungs every 6 (six) hours as needed for wheezing or  shortness of breath.   ALPRAZolam 0.5 MG tablet Commonly known as: Xanax Take 1 tablet (0.5 mg total) by mouth at bedtime as needed for anxiety.   ezetimibe 10 MG tablet Commonly known as: ZETIA Take 1 tablet (10 mg total) by mouth daily.   fluticasone 50 MCG/ACT nasal spray Commonly known as: FLONASE Place 2 sprays into both nostrils daily.   gabapentin 300 MG capsule Commonly known as: NEURONTIN Take 1 capsule (300 mg total) by mouth 2 (two) times daily as needed. For pain What changed:   when to take this  additional instructions    metoprolol succinate 25 MG 24 hr tablet Commonly known as: TOPROL-XL Take 1 tablet (25 mg total) by mouth daily. Notes to patient: Monitor Blood Pressure Daily and keep a log for primary care physician.  You may need to make changes to your medications with rapid weight loss.     ondansetron 4 MG disintegrating tablet Commonly known as: ZOFRAN-ODT Take 1 tablet (4 mg total) by mouth every 6 (six) hours as needed for nausea or vomiting.   pantoprazole 40 MG tablet Commonly known as: PROTONIX Take 1 tablet (40 mg total) by mouth daily.   Trelegy Ellipta 100-62.5-25 MCG/INH Aepb Generic drug: Fluticasone-Umeclidin-Vilant Inhale 1 puff into the lungs daily.   Vitamin D3 50 MCG (2000 UT) Tabs Take 2,000 Units by mouth daily.            Discharge Care Instructions  (From admission, onward)         Start     Ordered   12/09/20 0000  Discharge wound care:       Comments: Remove Bandaids tomorrow, ok to shower tomorrow. Steristrips may fall off in 1-3 weeks.   12/09/20 4742          Follow-up Information    Surgery, [place]. Go on 12/30/2020.   Specialty: General Surgery Why: at 9:15am with Dr. Hassell Done.  Please arrive 15 minutes prior to your appointment time.  Thank you. Contact information: [address] [place] [address] [phone]        Carlena Hurl, PA-C. Go on 01/29/2021.   Specialty: General Surgery Why: at 2:00pm for Dr. Hassell Done.  Please arrive 15 minutes prior to your appointment time.  Thank you. Contact information: [address] [place] [address] [phone]                The results of significant diagnostics from this hospitalization (including imaging, microbiology, ancillary and laboratory) are listed below for reference.    Significant Diagnostic Studies: CT Chest Wo Contrast  Result Date: 11/20/2020 CLINICAL DATA:  Non-small cell lung cancer, status post SBRT, left rib fractures EXAM: CT CHEST  WITHOUT CONTRAST TECHNIQUE: Multidetector CT imaging of the chest was performed following the standard protocol without IV contrast. COMPARISON:  Rib radiographs, 10/08/2020, CT chest, 04/15/2020 FINDINGS: Cardiovascular: Aortic atherosclerosis. Normal heart size. Left coronary artery calcifications. No pericardial effusion. Mediastinum/Nodes: No enlarged mediastinal, hilar, or axillary lymph nodes. Thyroid gland, trachea, and esophagus demonstrate no significant findings. Lungs/Pleura: Mild centrilobular and paraseptal emphysema. No significant change in post treatment appearance of the left lower lobe, with bandlike scarring and or atelectasis about a nodule measuring approximately 1.2 x 1.0 cm (series 3, image 74). No pleural effusion or pneumothorax. Upper Abdomen: No acute abnormality.  Hepatic steatosis. Musculoskeletal: No chest wall mass or suspicious bone lesions identified. Redemonstrated minimally displaced fractures of the posterolateral left sixth and seventh ribs with  nonunion (series 2, image 56, 65). Superficial subcutaneous inclusion cyst of the midline anterior chest (series 4, image 35). IMPRESSION: 1. No significant change in post treatment appearance of the left lower lobe, with bandlike post radiation scarring about a nodule measuring approximately 1.2 x 1.0 cm. 2. 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 3. Emphysema. 4. Coronary artery disease. 5. Hepatic steatosis. Aortic Atherosclerosis (ICD10-I70.0) and Emphysema (ICD10-J43.9). Electronically Signed   By: Eddie Candle M.D.   On: 11/20/2020 12:15    Labs: Basic Metabolic Panel: Recent Labs  Lab 12/03/20 1338 12/07/20 0630  NA 141 141  K 4.4 4.6  CL 107 108  CO2 27 25  GLUCOSE 103* 113*  BUN 27* 22  CREATININE 0.91 0.89  CALCIUM 9.7 9.4   Liver Function Tests: Recent Labs  Lab 12/07/20 0630  AST 44*  ALT 50*  ALKPHOS 121  BILITOT 1.2  PROT 7.5   ALBUMIN 4.2    CBC: Recent Labs  Lab 12/03/20 1338 12/07/20 1337 12/08/20 0442 12/09/20 0411  WBC 7.2 9.7 7.2 7.3  NEUTROABS  --   --  5.9 5.1  HGB 13.0 13.1 11.6* 11.2*  HCT 40.4 41.3 36.3 35.4*  MCV 97.3 100.0 98.4 99.7  PLT 233 152 216 189    CBG: No results for input(s): GLUCAP in the last 168 hours.  Active Problems:   S/P laparoscopic sleeve gastrectomy   VTE plan: no chemical prophylaxis recommended ([email or web address])  Time coordinating discharge: 15 min

## 2020-12-10 ENCOUNTER — Other Ambulatory Visit (HOSPITAL_COMMUNITY): Payer: Self-pay

## 2020-12-14 ENCOUNTER — Telehealth (HOSPITAL_COMMUNITY): Payer: Self-pay | Admitting: *Deleted

## 2020-12-15 ENCOUNTER — Other Ambulatory Visit (HOSPITAL_COMMUNITY): Payer: Self-pay

## 2020-12-15 ENCOUNTER — Telehealth (HOSPITAL_COMMUNITY): Payer: Self-pay | Admitting: *Deleted

## 2020-12-15 NOTE — Telephone Encounter (Signed)
1.  Tell me about your pain and pain management? Pt denies any pain.   2.  Let's talk about fluid intake.  How much total fluid are you taking in? Pt states that she is getting in at least 64oz of fluid including protein shakes, bottled water, and yogurt.   3.  How much protein have you taken in the last 2 days? Pt states she is meeting her goal of 60g of protein each day with the protein shakes and protein water.   4.  Have you had nausea?  Tell me about when have experienced nausea and what you did to help? Pt denies nausea.   5.  Has the frequency or color changed with your urine? Pt states that she is urinating "fine" with no changes in frequency or urgency.     6.  Tell me what your incisions look like? "Incisions look fine but are itching me so bad".  Pt encouraged not to scratch incisions and to make sure that her hands are clean if she has to rub around them.  Pt denies a fever, chills.  Pt states incisions are not swollen, open, or draining.  Pt encouraged to call [facility] if incisions change.   7.  Have you been passing gas? BM? Pt states that she is having BMs. Last BM 12/14/20.     8.  If a problem or question were to arise who would you call?  Do you know contact numbers for [place], [facility], and [facility]? Pt denies dehydration symptoms.  Pt can describe s/sx of dehydration.  Pt knows to call [facility] for surgical, [facility] for nutrition, and [facility] for non-urgent questions or concerns.   9.  How has the walking going? Pt states she is walking around and able to be active without difficulty.   10.  How are your vitamins and calcium going?  How are you taking them? Pt states that she is taking her supplements and vitamins without difficulty.  Reminded patient that the first 30 days post-operatively are important for successful recovery.  Practice good hand hygiene, wearing a mask when appropriate (since optional in most places), and minimizing exposure to people who live outside of the home, especially if  they are exhibiting any respiratory, GI, or illness-like symptoms.

## 2020-12-22 ENCOUNTER — Other Ambulatory Visit: Payer: Self-pay

## 2020-12-22 ENCOUNTER — Ambulatory Visit: Payer: Medicare PPO | Admitting: Family Medicine

## 2020-12-22 ENCOUNTER — Encounter: Payer: Medicare PPO | Attending: Surgery | Admitting: Skilled Nursing Facility1

## 2020-12-22 ENCOUNTER — Encounter: Payer: Self-pay | Admitting: Family Medicine

## 2020-12-22 DIAGNOSIS — K5900 Constipation, unspecified: Secondary | ICD-10-CM

## 2020-12-22 DIAGNOSIS — E66813 Obesity, class 3: Secondary | ICD-10-CM

## 2020-12-22 DIAGNOSIS — I10 Essential (primary) hypertension: Secondary | ICD-10-CM

## 2020-12-22 DIAGNOSIS — Z713 Dietary counseling and surveillance: Secondary | ICD-10-CM | POA: Insufficient documentation

## 2020-12-22 DIAGNOSIS — Z6841 Body Mass Index (BMI) 40.0 and over, adult: Secondary | ICD-10-CM

## 2020-12-22 DIAGNOSIS — C3432 Malignant neoplasm of lower lobe, left bronchus or lung: Secondary | ICD-10-CM | POA: Diagnosis not present

## 2020-12-22 DIAGNOSIS — E7800 Pure hypercholesterolemia, unspecified: Secondary | ICD-10-CM

## 2020-12-22 DIAGNOSIS — Z9884 Bariatric surgery status: Secondary | ICD-10-CM | POA: Insufficient documentation

## 2020-12-22 NOTE — Progress Notes (Addendum)
Established patient visit   Patient: Michelle Pitts   DOB: 1951-02-21   70 y.o. Female  MRN: [id number] Visit Date: 12/22/2020  Today's healthcare provider: Vernie Murders, PA-C   No chief complaint on file.  Subjective    HPI  Patient is a 70 year ld female who presents for follow up after having bariatric surgery.  Patient had gastric sleeve resection and upper endoscopy on 12/07/20 Patient states she is doing well from the surgery.  Has no complains except significant constipation.  She has tried glycerine suppositories with no relief. She is currently still on liquid protein drinks only.     Past Medical History:  Diagnosis Date  . Anemia   . Anxiety   . Breast cancer ([facility]) 01/23/2014   Left breast, T1c, N0, M0. ER/PR +; Her 2 neu not overexpressed. Oncotype DX: 13,Low risk.9% over 10 years.   . Cancer ([place]) 1995   vulvar, [facility]  . COPD (chronic obstructive pulmonary disease) ([facility])    bronchitis  . Dyspnea    with exertion  . Dysrhythmia   . GERD (gastroesophageal reflux disease)   . History of colon polyps   . Hypercholesterolemia   . Hypertension   . Hypothyroidism   . Insomnia   . Lung cancer ([facility])   . 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 Personal history of radiation therapy   . Personal history of tobacco use, presenting hazards to health 11/18/2015  . Sleep apnea    NO CPAP  . Sleep apnea   . SOB (shortness of breath) on exertion   . Spinal stenosis   . Thyroid disease   . Vitamin D deficiency    Past Surgical History:  Procedure Laterality Date  . BLADDER SUSPENSION  2001  . BREAST BIOPSY Right   . BREAST BIOPSY Left   . BREAST CYST ASPIRATION Left 1985  . BREAST EXCISIONAL BIOPSY Right 1975   x 2  . BREAST SURGERY Left 01/23/2014   T1c, N0; Er/ PR positive, Her 2 negative   2 years anti-estrogen RX. Oncotype DX: 13,Low risk.9% over 10 years  . BREAST SURGERY Left 04/10/2014   Debridement of fat necrosis, left breast cancer wide excision  site.   . CHOLECYSTECTOMY    . COLONOSCOPY  2014   Dr Tiffany Kocher  . COLONOSCOPY WITH PROPOFOL N/A 10/10/2016   Procedure: COLONOSCOPY WITH PROPOFOL;  Surgeon: Manya Silvas, MD;  [facility]COPY;  Service: Endoscopy;  Laterality: N/A;  . ELECTROMAGNETIC NAVIGATION BROCHOSCOPY N/A 04/12/2018   Procedure: ELECTROMAGNETIC NAVIGATION BRONCHOSCOPY;  Surgeon: Flora Lipps, MD;  Location: [facility] ORS;  Service: Cardiopulmonary;  Laterality: N/A;  . LAPAROSCOPIC BILATERAL SALPINGO OOPHERECTOMY Bilateral 06/27/2018   Procedure: LAPAROSCOPIC BILATERAL SALPINGO OOPHORECTOMY;  Surgeon: Homero Fellers, MD;  Location: [facility] ORS;  Service: Gynecology;  Laterality: Bilateral;  . LAPAROSCOPIC GASTRIC SLEEVE RESECTION N/A 12/07/2020   Procedure: LAPAROSCOPIC GASTRIC SLEEVE RESECTION;  Surgeon: Johnathan Hausen, MD;  Location: [facility] ORS;  Service: General;  Laterality: N/A;  . ROTATOR CUFF REPAIR Right 1995  . UPPER GI ENDOSCOPY N/A 12/07/2020   Procedure: UPPER GI ENDOSCOPY;  Surgeon: Johnathan Hausen, MD;  Location: [facility] ORS;  Service: General;  Laterality: N/A;  . VAGINAL HYSTERECTOMY  1978   partial  . VULVECTOMY PARTIAL  1995  . wide excision debriedment Left 04/10/14   . Family History  Problem Relation Age of Onset  . Diabetes Father   . COPD Mother   . Heart failure Sister   .  Emphysema Sister   . Diabetes Sister   . Hypertension Sister   . Emphysema Brother   . Lung cancer Brother   . Crohn's disease Sister   . Hypertension Sister   . Hypertension Sister   . Emphysema Brother   . Cancer Other        great great paternal aunt, ? age  . Cancer Maternal Grandfather 98       ? source  . Cancer Paternal Grandfather 73       ? source  . Breast cancer Neg Hx    Social History   Tobacco Use  . Smoking status: Former Smoker    Packs/day: 1.00    Years: 50.00    Pack years: 50.00    Types: Cigarettes    Quit date: 09/15/2014    Years since quitting: 6.2  . Smokeless tobacco: Never Used   Vaping Use  . Vaping Use: Never used  Substance Use Topics  . Alcohol use: Yes    Alcohol/week: 7.0 standard drinks    Types: 7 Cans of beer per week    Comment: usually beer, occasionally wine. Daily  . Drug use: No    Allergies  Allergen Reactions  . Codeine Nausea And Vomiting  . Gramineae Pollens     sinus  . Hydrochlorothiazide Swelling and Other (See Comments)    Swelling of tongue.  [place] [Levofloxacin In D5w] Hives, Swelling and Other (See Comments)    Leg swelling      Medications: Outpatient Medications Prior to Visit  Medication Sig  . albuterol (VENTOLIN HFA) 108 (90 Base) MCG/ACT inhaler Inhale 1-2 puffs into the lungs every 6 (six) hours as needed for wheezing or shortness of breath.  . ALPRAZolam (XANAX) 0.5 MG tablet Take 1 tablet (0.5 mg total) by mouth at bedtime as needed for anxiety.  Marland Kitchen ezetimibe (ZETIA) 10 MG tablet Take 1 tablet (10 mg total) by mouth daily.  . fluticasone (FLONASE) 50 MCG/ACT nasal spray Place 2 sprays into both nostrils daily.  . Fluticasone-Umeclidin-Vilant (TRELEGY ELLIPTA) 100-62.5-25 MCG/INH AEPB Inhale 1 puff into the lungs daily.  Marland Kitchen gabapentin (NEURONTIN) 300 MG capsule Take 1 capsule (300 mg total) by mouth 2 (two) times daily as needed. For pain (Patient taking differently: Take 300 mg by mouth in the morning.)  . metoprolol succinate (TOPROL-XL) 25 MG 24 hr tablet Take 1 tablet (25 mg total) by mouth daily.  . ondansetron (ZOFRAN-ODT) 4 MG disintegrating tablet Take 1 tablet (4 mg total) by mouth every 6 (six) hours as needed for nausea or vomiting.  . pantoprazole (PROTONIX) 40 MG tablet Take 1 tablet (40 mg total) by mouth daily.  . Cholecalciferol (VITAMIN D3) 50 MCG (2000 UT) TABS Take 2,000 Units by mouth daily.   No facility-administered medications prior to visit.    Review of Systems  Gastrointestinal: Positive for constipation. Negative for abdominal pain, anal bleeding, blood in stool, diarrhea, nausea and  vomiting.       Objective    There were no vitals taken for this visit.     Physical Exam Constitutional:      General: She is not in acute distress.    Appearance: She is well-developed.  HENT:     Head: Normocephalic and atraumatic.     Right Ear: Hearing and tympanic membrane normal.     Left Ear: Hearing and tympanic membrane normal.     Nose: Nose normal.     Mouth/Throat:     Pharynx:  Oropharynx is clear.  Eyes:     General: Lids are normal. No scleral icterus.       Right eye: No discharge.        Left eye: No discharge.     Conjunctiva/sclera: Conjunctivae normal.  Cardiovascular:     Rate and Rhythm: Normal rate and regular rhythm.     Heart sounds: Normal heart sounds.  Pulmonary:     Effort: Pulmonary effort is normal. No respiratory distress.     Breath sounds: Normal breath sounds.  Abdominal:     General: Bowel sounds are normal.     Palpations: Abdomen is soft.     Comments: Well healed from recent gastric bypass surgery.  Musculoskeletal:        General: Normal range of motion.     Cervical back: Neck supple.  Skin:    Findings: No lesion or rash.  Neurological:     Mental Status: She is alert and oriented to person, place, and time.  Psychiatric:        Speech: Speech normal.        Behavior: Behavior normal.        Thought Content: Thought content normal.      No results found for any visits on 12/22/20.  Assessment & Plan     1. S/P laparoscopic sleeve gastrectomy Feeling well without pain or nausea since bariatric surgery 4-25.22. Recheck labs and follow bariatric surgeon's diet plan. - CBC with Differential/Platelet - Comprehensive metabolic panel - Hemoglobin A1c  2. Constipation, unspecified constipation type Onset of rectal fullness and discomfort the past several days. No relief from Glycerine suppository or Preparation-H suppository. Some blood in stool after heavy straining. On DRE, hard large stool palpable. Recommend Fleets  Enema. Should keep appointment with surgeon next week.  3. Class 3 severe obesity due to excess calories with serious comorbidity and body mass index (BMI) of 40.0 to 44.9 in adult [facility]) Had gastric sleeve surgery 12-07-20. - CBC with Differential/Platelet - Comprehensive metabolic panel - Hemoglobin A1c - TSH  4. Primary cancer of left lower lobe of lung ([place]) Continue follow up with oncologist (Dr. Tish Men) - CBC with Differential/Platelet - Comprehensive metabolic panel  5. Essential hypertension Well controlled on the Metoprolol Succinate 25 mg qd. Monitor BP at home and consider discontinuing if BP drops lower. Recheck labs. - CBC with Differential/Platelet - Comprehensive metabolic panel - Lipid panel - TSH  6. Pure hypercholesterolemia Still on Zetia. May be able to stop pending weight loss and lab reports. - Comprehensive metabolic panel - Lipid panel - TSH   No follow-ups on file.      I, Jimma Ortman, PA-C, have reviewed all documentation for this visit. The documentation on 12/22/20 for the exam, diagnosis, procedures, and orders are all accurate and complete.    Vernie Murders, PA-C  [employer] [phone] (phone) [phone] (fax)  [employer]

## 2020-12-22 NOTE — Patient Instructions (Signed)
Constipation, Adult Constipation is when a person has fewer than three bowel movements in a week, has difficulty having a bowel movement, or has stools (feces) that are dry, hard, or larger than normal. Constipation may be caused by an underlying condition. It may become worse with age if a person takes certain medicines and does not take in enough fluids. Follow these instructions at home: Eating and drinking  Follow surgeon's recommendations for diet after bariatric surgery.   General instructions  Exercise regularly or as told by your health care provider. Try to do 150 minutes of moderate exercise each week.  Use the bathroom when you have the urge to go. Do not hold it in.  Take over-the-counter and prescription medicines only as told by your health care provider. This includes any fiber supplements.  During bowel movements: ? Practice deep breathing while relaxing the lower abdomen. ? Practice pelvic floor relaxation.  Watch your condition for any changes. Let your health care provider know about them.  Keep all follow-up visits as told by your health care provider. This is important. Contact a health care provider if:  You have pain that gets worse.  You have a fever.  You do not have a bowel movement after 4 days.  You vomit.  You are not hungry or you lose weight.  You are bleeding from the opening between the buttocks (anus).  You have thin, pencil-like stools. Get help right away if:  You have a fever and your symptoms suddenly get worse.  You leak stool or have blood in your stool.  Your abdomen is bloated.  You have severe pain in your abdomen.  You feel dizzy or you faint. Summary  Constipation is when a person has fewer than three bowel movements in a week, has difficulty having a bowel movement, or has stools (feces) that are dry, hard, or larger than normal.  Eat foods that have a lot of fiber, such as beans, whole grains, and fresh fruits and  vegetables.  Drink enough fluid to keep your urine pale yellow.  Take over-the-counter and prescription medicines only as told by your health care provider. This includes any fiber supplements. This information is not intended to replace advice given to you by your health care provider. Make sure you discuss any questions you have with your health care provider. Document Revised: 06/19/2019 Document Reviewed: 06/19/2019 Elsevier Patient Education  [place].

## 2020-12-23 LAB — COMPREHENSIVE METABOLIC PANEL
ALT: 45 IU/L — ABNORMAL HIGH (ref 0–32)
AST: 42 IU/L — ABNORMAL HIGH (ref 0–40)
Albumin/Globulin Ratio: 2.1 (ref 1.2–2.2)
Albumin: 4.6 g/dL (ref 3.7–4.7)
Alkaline Phosphatase: 189 IU/L — ABNORMAL HIGH (ref 44–121)
BUN/Creatinine Ratio: 27 (ref 12–28)
BUN: 24 mg/dL (ref 8–27)
Bilirubin Total: 0.3 mg/dL (ref 0.0–1.2)
CO2: 22 mmol/L (ref 20–29)
Calcium: 10.4 mg/dL — ABNORMAL HIGH (ref 8.7–10.3)
Chloride: 102 mmol/L (ref 96–106)
Creatinine, Ser: 0.9 mg/dL (ref 0.57–1.00)
Globulin, Total: 2.2 g/dL (ref 1.5–4.5)
Glucose: 94 mg/dL (ref 65–99)
Potassium: 4.7 mmol/L (ref 3.5–5.2)
Sodium: 145 mmol/L — ABNORMAL HIGH (ref 134–144)
Total Protein: 6.8 g/dL (ref 6.0–8.5)
eGFR: 68 mL/min/{1.73_m2} (ref >59)

## 2020-12-23 LAB — LIPID PANEL
Chol/HDL Ratio: 3.4 ratio (ref 0.0–4.4)
Cholesterol, Total: 162 mg/dL (ref 100–199)
HDL: 47 mg/dL (ref >39)
LDL Chol Calc (NIH): 88 mg/dL (ref 0–99)
Triglycerides: 155 mg/dL — ABNORMAL HIGH (ref 0–149)
VLDL Cholesterol Cal: 27 mg/dL (ref 5–40)

## 2020-12-23 LAB — CBC WITH DIFFERENTIAL/PLATELET
Basophils Absolute: 0.1 10*3/uL (ref 0.0–0.2)
Basos: 1 %
EOS (ABSOLUTE): 0.1 10*3/uL (ref 0.0–0.4)
Eos: 2 %
Hematocrit: 39.6 % (ref 34.0–46.6)
Hemoglobin: 13.3 g/dL (ref 11.1–15.9)
Immature Grans (Abs): 0 10*3/uL (ref 0.0–0.1)
Immature Granulocytes: 0 %
Lymphocytes Absolute: 1.3 10*3/uL (ref 0.7–3.1)
Lymphs: 17 %
MCH: 30.9 pg (ref 26.6–33.0)
MCHC: 33.6 g/dL (ref 31.5–35.7)
MCV: 92 fL (ref 79–97)
Monocytes Absolute: 0.6 10*3/uL (ref 0.1–0.9)
Monocytes: 8 %
Neutrophils Absolute: 5.2 10*3/uL (ref 1.4–7.0)
Neutrophils: 72 %
Platelets: 318 10*3/uL (ref 150–450)
RBC: 4.3 x10E6/uL (ref 3.77–5.28)
RDW: 12.1 % (ref 11.7–15.4)
WBC: 7.3 10*3/uL (ref 3.4–10.8)

## 2020-12-23 LAB — HEMOGLOBIN A1C
Est. average glucose Bld gHb Est-mCnc: 114 mg/dL
Hgb A1c MFr Bld: 5.6 % (ref 4.8–5.6)

## 2020-12-23 LAB — TSH: TSH: 3.04 u[IU]/mL (ref 0.450–4.500)

## 2020-12-24 NOTE — Progress Notes (Addendum)
2 Week Post-Operative Nutrition Class   Patient was seen on 12/22/2020 for Post-Operative Nutrition education at the [facility].    Surgery date: 12/07/2020 Surgery type: sleeve Start weight at [facility]: 262 pounds Weight today: 251.5#, 48.5% Fat, 40.2% TBW     The following the learning objectives were met by the patient during this course:  Identifies Phase 3 (Soft, High Proteins) Dietary Goals and will begin from 2 weeks post-operatively to 2 months post-operatively  Identifies appropriate sources of fluids and proteins   Identifies appropriate fat sources and healthy verses unhealthy fat types    States protein recommendations and appropriate sources post-operatively  Identifies the need for appropriate texture modifications, mastication, and bite sizes when consuming solids  Identifies appropriate multivitamin and calcium sources post-operatively  Describes the need for physical activity post-operatively and will follow MD recommendations  States when to call healthcare provider regarding medication questions or post-operative complications   Handouts given during class include:  Phase 3A: Soft, High Protein Diet Handout  Phase 3 High Protein Meals  Healthy Fats   Follow-Up Plan: Patient will follow-up at [facility] in 6 weeks for 2 month post-op nutrition visit for diet advancement per MD.

## 2020-12-28 ENCOUNTER — Encounter: Payer: Self-pay | Admitting: Family Medicine

## 2020-12-28 ENCOUNTER — Telehealth: Payer: Self-pay | Admitting: Skilled Nursing Facility1

## 2020-12-28 NOTE — Telephone Encounter (Signed)
RD called pt to verify fluid intake once starting soft, solid proteins 2 week post-bariatric surgery.   Daily Fluid intake: unknown Daily Protein intake: 60 Bowel Habits: liquid; taking mirilax daily due to fear of constipation   Concerns/issues:   Pt states she had constipation and could not eat or drink anything. Pt states she experiences pain when she eats but not with fluids stating she is chewing her foods until applesauce consistency and ensuring it is moist pt state she feels yogurt feels as though it hangs.   Pt states she does not have any emesis or nausea   Dietitian advised pt to contact her surgeon about her bowels and the feeling of foods having feeling of hung up; hold off on solid proteins until speaking with your surgeon so limited to yogurt and beans and cottage cheese and others on the list marked in post-op class  Dietitian advised pt to log her ounces so she knows how many she is getting in  Dietitian to call pt on Thursday to check in

## 2020-12-31 ENCOUNTER — Other Ambulatory Visit: Payer: Self-pay | Admitting: General Surgery

## 2020-12-31 ENCOUNTER — Telehealth: Payer: Self-pay | Admitting: Skilled Nursing Facility1

## 2020-12-31 DIAGNOSIS — C50312 Malignant neoplasm of lower-inner quadrant of left female breast: Secondary | ICD-10-CM

## 2020-12-31 NOTE — Telephone Encounter (Signed)
Called pt to check in on her progress.   Pt state she was started on an antibiotic due to a UTI.   Pt states her surgeon also advised she start eating non starchy vegetables.   Dietitian advised she follow whatever her surgeons advised her to do.   Pt states she has been drinking 64 fluid ounces but still has not had bowel movements regularly but taking mirilax. Pt states she has been telling her surgeons about her bowel troubles.

## 2021-01-07 ENCOUNTER — Ambulatory Visit
Admission: EM | Admit: 2021-01-07 | Discharge: 2021-01-07 | Disposition: A | Discharge status: Home / Self Care | Payer: Medicare PPO | Attending: Emergency Medicine | Admitting: Emergency Medicine

## 2021-01-07 ENCOUNTER — Other Ambulatory Visit: Payer: Self-pay

## 2021-01-07 DIAGNOSIS — L0291 Cutaneous abscess, unspecified: Secondary | ICD-10-CM

## 2021-01-07 MED ORDER — SULFAMETHOXAZOLE-TRIMETHOPRIM 800-160 MG PO TABS: 1.0000 | ORAL_TABLET | Freq: Two times a day (BID) | ORAL | 0 refills | Status: AC

## 2021-01-07 NOTE — ED Triage Notes (Signed)
Pt sts she have been having a cyst between breast area for several years. sts that the cyst burst earlier this week. sts there was drainage and foul odor.

## 2021-01-07 NOTE — ED Provider Notes (Signed)
Roderic Palau    CSN: [id number] Arrival date & time: 01/07/21  1502      History   Chief Complaint Chief Complaint  Patient presents with  . Cyst    HPI Michelle Pitts is a 70 y.o. female.   Patient presents with an abscess on her chest between her breasts which has been there for 20 years.  She states the area became red and opened a couple of days ago.  It has been draining malodorous purulent drainage.  She denies fever, chills, or other symptoms.  Treatment at home with a bandage.  Her medical history includes sleeve gastrectomy on 12/07/2020, COPD, breast cancer, hypertension.  The history is provided by the patient and medical records.    Past Medical History:  Diagnosis Date  . Anemia   . Anxiety   . Breast cancer ([place]) 01/23/2014   Left breast, T1c, N0, M0. ER/PR +; Her 2 neu not overexpressed. Oncotype DX: 13,Low risk.9% over 10 years.   . Cancer ([place]) 1995   vulvar, [facility]  . COPD (chronic obstructive pulmonary disease) (H[facility])    bronchitis  . Dyspnea    with exertion  . Dysrhythmia   . GERD (gastroesophageal reflux disease)   . History of colon polyps   . Hypercholesterolemia   . Hypertension   . Hypothyroidism   . Insomnia   . Lung cancer ([place])   . 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 Personal history of radiation therapy   . Personal history of tobacco use, presenting hazards to health 11/18/2015  . Sleep apnea    NO CPAP  . Sleep apnea   . SOB (shortness of breath) on exertion   . Spinal stenosis   . Thyroid disease   . Vitamin D deficiency     Patient Active Problem List   Diagnosis Date Noted  . 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 Primary cancer of left lower lobe of lung ([place]) 11/25/2020  . S/P bilateral oophorectomy 06/27/2018  . Aortic atherosclerosis ([place]) 06/21/2018  . Malignant neoplasm of lung ([place]) 06/21/2018  . Abnormal EKG 06/21/2018  . Coronary artery calcification seen on CT scan 06/21/2018  . Pneumothorax  on left 04/18/2018  . Pneumothorax 04/13/2018  . Pneumothorax after biopsy 04/12/2018  . Bilateral leg numbness 11/10/2017  . Obesity, Class III, BMI 40-49.9 (morbid obesity) ([place]) 11/10/2017  . DNR no code (do not resuscitate) 02/08/2016  . Personal history of tobacco use, presenting hazards to health 11/18/2015  . Shortness of breath 07/24/2015  . Malignant neoplasm of lower-inner quadrant of left female breast ([place]) 07/22/2015  . Tachycardia 07/22/2015  . Anxiety 05/15/2015  . Colon polyp 05/15/2015  . CAFL (chronic airflow limitation) ([place]) 05/15/2015  . Elevated blood sugar 05/15/2015  . Abnormal liver enzymes 05/15/2015  . Family history of diabetes mellitus 05/15/2015  . BP (high blood pressure) 05/15/2015  . Cannot sleep 05/15/2015  . Apnea, sleep 05/15/2015  . Avitaminosis D 05/15/2015  . 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 Anxiety disorder 05/15/2015  . Chronic obstructive pulmonary disease ([place]) 05/15/2015  . Gastro-esophageal reflux disease without esophagitis 05/15/2015  . 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 Adult hypothyroidism 05/15/2015  . Pure hypercholesterolemia 05/15/2015  . 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 BLADDER SUSPENSION  2001  . BREAST BIOPSY Right   . BREAST BIOPSY Left   . BREAST CYST ASPIRATION Left 1985  .  BREAST EXCISIONAL BIOPSY Right 1975   x 2  . BREAST SURGERY Left 01/23/2014   T1c, N0; Er/ PR positive, Her 2 negative   2 years anti-estrogen RX. Oncotype DX: 13,Low risk.9% over 10 years  . BREAST SURGERY Left 04/10/2014   Debridement of fat necrosis, left breast cancer wide excision site.   . CHOLECYSTECTOMY    . COLONOSCOPY  2014   Dr Tiffany Kocher  . COLONOSCOPY WITH PROPOFOL N/A 10/10/2016   Procedure: COLONOSCOPY WITH PROPOFOL;  Surgeon: Manya Silvas, MD;  Location[facility] ENDOSCOPY;  Service: Endoscopy;  Laterality: N/A;  . ELECTROMAGNETIC NAVIGATION  BROCHOSCOPY N/A 04/12/2018   Procedure: ELECTROMAGNETIC NAVIGATION BRONCHOSCOPY;  Surgeon: Flora Lipps, MD;  Location: [facility] ORS;  Service: Cardiopulmonary;  Laterality: N/A;  . LAPAROSCOPIC BILATERAL SALPINGO OOPHERECTOMY Bilateral 06/27/2018   Procedure: LAPAROSCOPIC BILATERAL SALPINGO OOPHORECTOMY;  Surgeon: Homero Fellers, MD;  Location: [facility] ORS;  Service: Gynecology;  Laterality: Bilateral;  . LAPAROSCOPIC GASTRIC SLEEVE RESECTION N/A 12/07/2020   Procedure: LAPAROSCOPIC GASTRIC SLEEVE RESECTION;  Surgeon: Johnathan Hausen, MD;  Location: [facility] ORS;  Service: General;  Laterality: N/A;  . ROTATOR CUFF REPAIR Right 1995  . UPPER GI ENDOSCOPY N/A 12/07/2020   Procedure: UPPER GI ENDOSCOPY;  Surgeon: Johnathan Hausen, MD;  Location: [facility] ORS;  Service: General;  Laterality: N/A;  . VAGINAL HYSTERECTOMY  1978   partial  . VULVECTOMY PARTIAL  1995  . wide excision debriedment Left 04/10/14    OB History    Gravida  3   Para  2   Term      Preterm      AB  1   Living        SAB  1   IAB      Ectopic      Multiple      Live Births           Obstetric Comments  1st Menstrual Cycle:  12  1st Pregnancy:  16         Home Medications    Prior to Admission medications   Medication Sig Start Date End Date Taking? Authorizing Provider  sulfamethoxazole-trimethoprim (BACTRIM DS) 800-160 MG tablet Take 1 tablet by mouth 2 (two) times daily for 7 days. 01/07/21 01/14/21 Yes Sharion Balloon, NP  albuterol (VENTOLIN HFA) 108 (90 Base) MCG/ACT inhaler Inhale 1-2 puffs into the lungs every 6 (six) hours as needed for wheezing or shortness of breath. 07/07/20 07/07/21  [provider]  ALPRAZolam Duanne Moron) 0.5 MG tablet Take 1 tablet (0.5 mg total) by mouth at bedtime as needed for anxiety. 10/08/20   Mar Daring, PA-C  Cholecalciferol (VITAMIN D3) 50 MCG (2000 UT) TABS Take 2,000 Units by mouth daily.    [provider]  ezetimibe (ZETIA) 10 MG tablet Take 1  tablet (10 mg total) by mouth daily. 04/21/20   Minna Merritts, MD  fluticasone (FLONASE) 50 MCG/ACT nasal spray Place 2 sprays into both nostrils daily. 10/08/20   Mar Daring, PA-C  Fluticasone-Umeclidin-Vilant (TRELEGY ELLIPTA) 100-62.5-25 MCG/INH AEPB Inhale 1 puff into the lungs daily. 07/07/20   [provider]  gabapentin (NEURONTIN) 300 MG capsule Take 1 capsule (300 mg total) by mouth 2 (two) times daily as needed. For pain Patient taking differently: Take 300 mg by mouth in the morning. 09/01/20   Mar Daring, PA-C  metoprolol succinate (TOPROL-XL) 25 MG 24 hr tablet Take 1 tablet (25 mg total) by mouth daily. 04/10/20   Fenton Malling  M, PA-C  ondansetron (ZOFRAN-ODT) 4 MG disintegrating tablet Take 1 tablet (4 mg total) by mouth every 6 (six) hours as needed for nausea or vomiting. 12/09/20   Kinsinger, Arta Bruce, MD  pantoprazole (PROTONIX) 40 MG tablet Take 1 tablet (40 mg total) by mouth daily. 12/09/20   Kinsinger, Arta Bruce, MD    Family History Family History  Problem Relation Age of Onset  . Diabetes Father   . COPD Mother   . Heart failure Sister   . Emphysema Sister   . Diabetes Sister   . Hypertension Sister   . Emphysema Brother   . Lung cancer Brother   . Crohn's disease Sister   . Hypertension Sister   . Hypertension Sister   . Emphysema Brother   . Cancer Other        great great paternal aunt, ? age  . Cancer Maternal Grandfather 67       ? source  . Cancer Paternal Grandfather 5       ? source  . Breast cancer Neg Hx     Social History Social History   Tobacco Use  . Smoking status: Former Smoker    Packs/day: 1.00    Years: 50.00    Pack years: 50.00    Types: Cigarettes    Quit date: 09/15/2014    Years since quitting: 6.3  . Smokeless tobacco: Never Used  Vaping Use  . Vaping Use: Never used  Substance Use Topics  . Alcohol use: Yes    Alcohol/week: 7.0 standard drinks    Types: 7 Cans of beer per week     Comment: usually beer, occasionally wine. Daily  . Drug use: No     Allergies   Codeine, Gramineae pollens, Hydrochlorothiazide, Levaquin [levofloxacin in d5w], and Tape   Review of Systems Review of Systems  Constitutional: Negative for chills and fever.  Respiratory: Negative for cough and shortness of breath.   Cardiovascular: Negative for chest pain and palpitations.  Gastrointestinal: Negative for abdominal pain and vomiting.  Musculoskeletal: Negative for arthralgias and back pain.  Skin: Positive for color change and wound.  Neurological: Negative for weakness and numbness.  All other systems reviewed and are negative.    Physical Exam Triage Vital Signs ED Triage Vitals  Enc Vitals Group     BP      Pulse      Resp      Temp      Temp src      SpO2      Weight      Height      Head Circumference      Peak Flow      Pain Score      Pain Loc      Pain Edu?      Excl. in [place]?    No data found.  Updated Vital Signs BP 128/79   Pulse (!) 101   Temp 98.3 F (36.8 C) (Oral)   Resp 18   Ht 5' 4.5" (1.638 m)   Wt 247 lb (112 kg)   SpO2 95%   BMI 41.74 kg/m   Visual Acuity Right Eye Distance:   Left Eye Distance:   Bilateral Distance:    Right Eye Near:   Left Eye Near:    Bilateral Near:     Physical Exam Vitals and nursing note reviewed.  Constitutional:      General: She is not in acute distress.    Appearance: She  is well-developed. She is obese. She is not ill-appearing.  HENT:     Head: Normocephalic and atraumatic.     Mouth/Throat:     Mouth: Mucous membranes are moist.  Eyes:     Conjunctiva/sclera: Conjunctivae normal.  Cardiovascular:     Rate and Rhythm: Normal rate and regular rhythm.     Heart sounds: Normal heart sounds.  Pulmonary:     Effort: Pulmonary effort is normal. No respiratory distress.     Breath sounds: Normal breath sounds.  Chest:    Abdominal:     Palpations: Abdomen is soft.     Tenderness: There is no  abdominal tenderness.  Musculoskeletal:     Cervical back: Neck supple.  Skin:    General: Skin is warm and dry.     Findings: Erythema and lesion present.     Comments: Approximately 5 x 6 cm area of induration and erythema on chest between breasts.  No current drainage.  Neurological:     General: No focal deficit present.     Mental Status: She is alert and oriented to person, place, and time.     Gait: Gait normal.  Psychiatric:        Mood and Affect: Mood normal.        Behavior: Behavior normal.      UC Treatments / Results  Labs (all labs ordered are listed, but only abnormal results are displayed) Labs Reviewed - No data to display  EKG   Radiology No results found.  Procedures Procedures (including critical care time)  Medications Ordered in UC Medications - No data to display  Initial Impression / Assessment and Plan / UC Course  I have reviewed the triage vital signs and the nursing notes.  Pertinent labs & imaging results that were available during my care of the patient were reviewed by me and considered in my medical decision making (see chart for details).   Abscess.  No indication for I&D today as the wound is already draining.  Treating with Septra DS.  Wound care instructions discussed.  Instructed patient to schedule an appointment with a general surgeon as soon as possible.  She agrees to plan of care.   Final Clinical Impressions(s) / UC Diagnoses   Final diagnoses:  Abscess     Discharge Instructions     Take the antibiotic as directed.  Keep your wound clean and dry.  Wash it gently with soap and water twice a day.  Apply an antibiotic ointment and bandage.    Follow-up with a general surgeon such as the one listed below.        ED Prescriptions    Medication Sig Dispense Auth. Provider   sulfamethoxazole-trimethoprim (BACTRIM DS) 800-160 MG tablet Take 1 tablet by mouth 2 (two) times daily for 7 days. 14 tablet Sharion Balloon, NP      PDMP not reviewed this encounter.   Sharion Balloon, NP 01/07/21 1535

## 2021-01-07 NOTE — Discharge Instructions (Signed)
Take the antibiotic as directed.  Keep your wound clean and dry.  Wash it gently with soap and water twice a day.  Apply an antibiotic ointment and bandage.    Follow-up with a general surgeon such as the one listed below.

## 2021-01-11 ENCOUNTER — Other Ambulatory Visit: Payer: Self-pay | Admitting: Physician Assistant

## 2021-01-11 DIAGNOSIS — M546 Pain in thoracic spine: Secondary | ICD-10-CM

## 2021-01-11 NOTE — Telephone Encounter (Signed)
I closed this chart on 12-22-20 and apparently did not have the vital signs in the record. I don't know if we could find them to addend to her chart.

## 2021-01-13 ENCOUNTER — Telehealth: Payer: Self-pay | Admitting: Skilled Nursing Facility1

## 2021-01-13 NOTE — Telephone Encounter (Signed)
Pt states she is concerned she has not lost any weight.   Dietitian advised that is normal

## 2021-02-01 ENCOUNTER — Encounter: Payer: Medicare PPO | Attending: Surgery | Admitting: Skilled Nursing Facility1

## 2021-02-01 ENCOUNTER — Other Ambulatory Visit: Payer: Self-pay

## 2021-02-01 NOTE — Progress Notes (Signed)
Bariatric Nutrition Follow-Up Visit Medical Nutrition Therapy    NUTRITION ASSESSMENT    Anthropometrics  Surgery date: 12/07/2020 Surgery type: sleeve Start weight at [facility]: 262 pounds Weight today: pt declined   Clinical  Medical hx: COPD, GERD, anxiety, breast cancer, lung cancer Medications: see list  Labs: A1C 5.6, triglycerides 155, sodium 145   Lifestyle & Dietary Hx  Pt states her surgeon advised her to add in vegetables before her 2 weeks post op appointment.  Pt states her multi has a gross after taste.: Dietitian advised pt to take it at bed time.   Pt states she loves fried okra.  Pt states she has learned drinking with her meals makes her sick.  Pt states she went to a chinese buffet and tried all of the different flavored chickens and got sic off of that.  Pt states she has been carving sweets and ice cream.  Pt states she has not been physically active.  Pt states she found out she has a reoccurrence of lung cancer one week before getting bariatric surgery.   Estimated daily fluid intake: 50-60 oz Estimated daily protein intake: 60+ g Supplements: bari ultra solo + calcium Current average weekly physical activity: ADL's   24-Hr Dietary Recall First Meal: 1 egg + ham or protein shake Snack:   Second Meal: chicken + cabbage Snack:   Third Meal: chicken + broccoli  Snack: deli ham and cheese + mustard Beverages: water, gatorade g2  Post-Op Goals/ Signs/ Symptoms Using straws: no Drinking while eating: no Chewing/swallowing difficulties: no Changes in vision: no Changes to mood/headaches: no Hair loss/changes to skin/nails: no Difficulty focusing/concentrating: no Sweating: no Dizziness/lightheadedness: no Palpitations: no  Carbonated/caffeinated beverages: no N/V/D/C/Gas: no Abdominal pain: no Dumping syndrome: no    NUTRITION DIAGNOSIS  Overweight/obesity ([place]-3.3) related to past poor dietary habits and physical inactivity as evidenced by  completed bariatric surgery and following dietary guidelines for continued weight loss and healthy nutrition status.     NUTRITION INTERVENTION Nutrition counseling (C-1) and education (E-2) to facilitate bariatric surgery goals, including: Diet advancement to the next phase (phase 4) now including non starchy vegetables The importance of consuming adequate calories as well as certain nutrients daily due to the body's need for essential vitamins, minerals, and fats The importance of daily physical activity and to reach a goal of at least 150 minutes of moderate to vigorous physical activity weekly (or as directed by their physician) due to benefits such as increased musculature and improved lab values The importance of intuitive eating specifically learning hunger-satiety cues and understanding the importance of learning a new body: The importance of mindful eating to avoid grazing behaviors  Goals: -Continue to aim for a minimum of 64 fluid ounces 7 days a week with at least 30 ounces being plain water -Eat non-starchy vegetables 2 times a day 7 days a week -Start out with soft cooked vegetables today and tomorrow; if tolerated begin to eat raw vegetables or cooked including salads -Eat your 3 ounces of protein first then start in on your non-starchy vegetables; once you understand how much of your meal leads to satisfaction and not full while still eating 3 ounces of protein and non-starchy vegetables you can eat them in any order  -Continue to aim for 30 minutes of activity at least 5 times a week -Do NOT cook with/add to your food: alfredo sauce, cheese sauce, barbeque sauce, ketchup, fat back, butter, bacon grease, grease, Crisco, OR SUGAR   Handouts Provided Include  Phase 4  Learning Style & Readiness for Change Teaching method utilized: Visual & Auditory  Demonstrated degree of understanding via: Teach Back  Readiness Level: contemplative Barriers to learning/adherence to lifestyle  change: unidentified   RD's Notes for Next Visit Assess adherence to pt chosen goals    MONITORING & EVALUATION Dietary intake, weekly physical activity, body weight  Next Steps Patient is to follow-up in 3-4 months

## 2021-02-08 ENCOUNTER — Other Ambulatory Visit: Payer: Self-pay | Admitting: *Deleted

## 2021-02-08 ENCOUNTER — Encounter: Payer: Self-pay | Admitting: Internal Medicine

## 2021-02-08 DIAGNOSIS — C3432 Malignant neoplasm of lower lobe, left bronchus or lung: Secondary | ICD-10-CM

## 2021-02-18 ENCOUNTER — Other Ambulatory Visit: Payer: Self-pay

## 2021-02-18 ENCOUNTER — Ambulatory Visit
Admission: RE | Admit: 2021-02-18 | Discharge: 2021-02-18 | Disposition: A | Discharge status: Home / Self Care | Payer: Medicare PPO | Source: Ambulatory Visit | Attending: General Surgery | Admitting: General Surgery

## 2021-02-18 DIAGNOSIS — Z1231 Encounter for screening mammogram for malignant neoplasm of breast: Secondary | ICD-10-CM | POA: Insufficient documentation

## 2021-02-18 DIAGNOSIS — C50312 Malignant neoplasm of lower-inner quadrant of left female breast: Secondary | ICD-10-CM

## 2021-02-18 DIAGNOSIS — Z853 Personal history of malignant neoplasm of breast: Secondary | ICD-10-CM | POA: Insufficient documentation

## 2021-04-13 ENCOUNTER — Encounter: Payer: Self-pay | Admitting: *Deleted

## 2021-04-13 ENCOUNTER — Other Ambulatory Visit: Payer: Self-pay

## 2021-04-13 ENCOUNTER — Encounter: Payer: Medicare PPO | Attending: Specialist | Admitting: *Deleted

## 2021-04-13 DIAGNOSIS — J449 Chronic obstructive pulmonary disease, unspecified: Secondary | ICD-10-CM

## 2021-04-13 NOTE — Progress Notes (Signed)
Virtual orientation call completed today. shehas an appointment on Date: 04/28/2021  for EP eval and gym Orientation.  Documentation of diagnosis can be found in [facility] Date: 07/07/2020 .

## 2021-04-23 ENCOUNTER — Other Ambulatory Visit: Payer: Self-pay

## 2021-04-23 ENCOUNTER — Ambulatory Visit
Admission: RE | Admit: 2021-04-23 | Discharge: 2021-04-23 | Disposition: A | Discharge status: Home / Self Care | Payer: Medicare PPO | Source: Ambulatory Visit | Attending: Radiation Oncology | Admitting: Radiation Oncology

## 2021-04-23 DIAGNOSIS — C3432 Malignant neoplasm of lower lobe, left bronchus or lung: Secondary | ICD-10-CM | POA: Diagnosis not present

## 2021-04-23 MED ORDER — IOHEXOL 350 MG/ML SOLN
75.0000 mL | Freq: Once | INTRAVENOUS | Status: AC | PRN
  Administered 2021-04-23: 75 mL via INTRAVENOUS

## 2021-04-26 ENCOUNTER — Telehealth: Payer: Self-pay

## 2021-04-26 NOTE — Telephone Encounter (Signed)
Copied from [place] [id number]. Topic: General - Other >> Apr 26, 2021 10:31 AM Leward Quan A wrote: Reason for CRM: Patient called in needing to discuss the result of a bone density scan from 5 yrs ago 05/13/2015. Was a patient of Anderson Malta please call Ph#  [phone]

## 2021-04-26 NOTE — Telephone Encounter (Signed)
Returned patient's call regarding report of BMD done in 2016. T-score was -0.9 at that time, which is considered normal density. Advised normally repeat test in 2-3 years. Patient voiced understanding.

## 2021-04-27 ENCOUNTER — Other Ambulatory Visit: Payer: Self-pay

## 2021-04-27 ENCOUNTER — Encounter: Payer: Medicare PPO | Attending: Surgery | Admitting: Skilled Nursing Facility1

## 2021-04-27 DIAGNOSIS — Z9884 Bariatric surgery status: Secondary | ICD-10-CM | POA: Diagnosis not present

## 2021-04-28 ENCOUNTER — Ambulatory Visit: Payer: Medicare PPO | Admitting: Radiation Oncology

## 2021-04-28 ENCOUNTER — Encounter: Payer: Medicare PPO | Attending: Specialist

## 2021-04-28 VITALS — Ht 64.8 in | Wt 229.6 lb

## 2021-04-28 DIAGNOSIS — J449 Chronic obstructive pulmonary disease, unspecified: Secondary | ICD-10-CM | POA: Insufficient documentation

## 2021-04-28 NOTE — Progress Notes (Signed)
Pulmonary Individual Treatment Plan  Patient Details  Name: Michelle Pitts MRN: [id number] Date of Birth: May 30, 1951 Referring Provider:   Flowsheet Row Pulmonary Rehab from 04/28/2021 in [facility] Cardiac and Pulmonary Rehab  Referring Provider Wallene Huh MD       Initial Encounter Date:  Flowsheet Row Pulmonary Rehab from 04/28/2021 in [facility] Cardiac and Pulmonary Rehab  Date 04/28/21       Visit Diagnosis: Chronic obstructive pulmonary disease, unspecified COPD type ([place])  Patient's Home Medications on Admission:  Current Outpatient Medications:    albuterol (VENTOLIN HFA) 108 (90 Base) MCG/ACT inhaler, Inhale 1-2 puffs into the lungs every 6 (six) hours as needed for wheezing or shortness of breath., Disp: , Rfl:    ALPRAZolam (XANAX) 0.5 MG tablet, Take 1 tablet (0.5 mg total) by mouth at bedtime as needed for anxiety., Disp: 30 tablet, Rfl: 5   Cholecalciferol (VITAMIN D3) 50 MCG (2000 UT) TABS, Take 2,000 Units by mouth daily., Disp: , Rfl:    ezetimibe (ZETIA) 10 MG tablet, Take 1 tablet (10 mg total) by mouth daily., Disp: 30 tablet, Rfl: 11   fluticasone (FLONASE) 50 MCG/ACT nasal spray, Place 2 sprays into both nostrils daily., Disp: 16 g, Rfl: 6   Fluticasone-Umeclidin-Vilant (TRELEGY ELLIPTA) 100-62.5-25 MCG/INH AEPB, Inhale 1 puff into the lungs daily., Disp: , Rfl:    gabapentin (NEURONTIN) 300 MG capsule, Take 1 capsule (300 mg total) by mouth 2 (two) times daily as needed. For pain (Patient taking differently: Take 300 mg by mouth in the morning.), Disp: 180 capsule, Rfl: 1   metoprolol succinate (TOPROL-XL) 25 MG 24 hr tablet, Take 1 tablet (25 mg total) by mouth daily., Disp: 90 tablet, Rfl: 3   ondansetron (ZOFRAN-ODT) 4 MG disintegrating tablet, Take 1 tablet (4 mg total) by mouth every 6 (six) hours as needed for nausea or vomiting. (Patient not taking: Reported on 04/13/2021), Disp: 20 tablet, Rfl: 0   pantoprazole (PROTONIX) 40 MG tablet, Take 1 tablet (40 mg  total) by mouth daily. (Patient not taking: Reported on 04/13/2021), Disp: 30 tablet, Rfl: 0  Past Medical History: Past Medical History:  Diagnosis Date   Anemia    Anxiety    Breast cancer ([place]) 01/23/2014   Left breast, T1c, N0, M0. ER/PR +; Her 2 neu not overexpressed. Oncotype DX: 13,Low risk.9% over 10 years.    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, [facility]   COPD (chronic obstructive pulmonary disease) (HCC)    bronchitis   Dyspnea    with exertion   Dysrhythmia    GERD (gastroesophageal reflux disease)    History of colon polyps    Hypercholesterolemia    Hypertension    Hypothyroidism    Insomnia    Lung cancer ([place])    Papilloma of breast 01/09/2014   Personal history of radiation therapy    Personal history of tobacco use, presenting hazards to health 11/18/2015   Sleep apnea    NO CPAP   Sleep apnea    SOB (shortness of breath) on exertion    Spinal stenosis    Thyroid disease    Vitamin D deficiency     Tobacco Use: Social History   Tobacco Use  Smoking Status Former   Packs/day: 1.00   Years: 50.00   Pack years: 50.00   Types: Cigarettes   Quit date: 09/15/2014   Years since quitting: 6.6  Smokeless Tobacco Never    Labs: Recent Review Scientist, physiological     Labs for  ITP Cardiac and Pulmonary Rehab Latest Ref Rng & Units 02/19/2019 03/02/2020 05/21/2020 10/08/2020 12/22/2020   Cholestrol 100 - 199 mg/dL 224(H) 215(H) 205(H) 214(H) 162   LDLCALC 0 - 99 mg/dL 124(H) 117(H) 115(H) 110(H) 88   HDL >39 mg/dL 76 80 71 73 47   Trlycerides 0 - 149 mg/dL 119 101 96 180(H) 155(H)   Hemoglobin A1c 4.8 - 5.6 % - 5.5 5.6 5.6 5.6   PHART 7.350 - 7.450 - - - - -   PCO2ART 32.0 - 48.0 mmHg - - - - -   HCO3 20.0 - 28.0 mmol/L - - - - -   O2SAT % - - - - -        Pulmonary Assessment Scores:  Pulmonary Assessment Scores     Row Name 04/28/21 1243         ADL UCSD   ADL Phase Entry     SOB Score total 18     Rest 0     Walk 0     Stairs 2     Bath 0     Dress 0      Shop 1           CAT Score   CAT Score 10           mMRC Score   mMRC Score 2              UCSD: Self-administered rating of dyspnea associated with activities of daily living (ADLs) 6-point scale (0 = "not at all" to 5 = "maximal or unable to do because of breathlessness")  Scoring Scores range from 0 to 120.  Minimally important difference is 5 units  CAT: CAT can identify the health impairment of COPD patients and is better correlated with disease progression.  CAT has a scoring range of zero to 40. The CAT score is classified into four groups of low (less than 10), medium (10 - 20), high (21-30) and very high (31-40) based on the impact level of disease on health status. A CAT score over 10 suggests significant symptoms.  A worsening CAT score could be explained by an exacerbation, poor medication adherence, poor inhaler technique, or progression of COPD or comorbid conditions.  CAT MCID is 2 points  mMRC: mMRC (Modified Medical Research Council) Dyspnea Scale is used to assess the degree of baseline functional disability in patients of respiratory disease due to dyspnea. No minimal important difference is established. A decrease in score of 1 point or greater is considered a positive change.   Pulmonary Function Assessment:   Exercise Target Goals: Exercise Program Goal: Individual exercise prescription set using results from initial 6 min walk test and THRR while considering  patient's activity barriers and safety.   Exercise Prescription Goal: Initial exercise prescription builds to 30-45 minutes a day of aerobic activity, 2-3 days per week.  Home exercise guidelines will be given to patient during program as part of exercise prescription that the participant will acknowledge.  Education: Aerobic Exercise: - Group verbal and visual presentation on the components of exercise prescription. Introduces F.I.T.T principle from ACSM for exercise prescriptions.  Reviews  F.I.T.T. principles of aerobic exercise including progression. Written material given at graduation.   Education: Resistance Exercise: - Group verbal and visual presentation on the components of exercise prescription. Introduces F.I.T.T principle from ACSM for exercise prescriptions  Reviews F.I.T.T. principles of resistance exercise including progression. Written material given at graduation.    Education: Exercise & Equipment  Safety: - Individual verbal instruction and demonstration of equipment use and safety with use of the equipment.   Education: Exercise Physiology & General Exercise Guidelines: - Group verbal and written instruction with models to review the exercise physiology of the cardiovascular system and associated critical values. Provides general exercise guidelines with specific guidelines to those with heart or lung disease.    Education: Flexibility, Balance, Mind/Body Relaxation: - Group verbal and visual presentation with interactive activity on the components of exercise prescription. Introduces F.I.T.T principle from ACSM for exercise prescriptions. Reviews F.I.T.T. principles of flexibility and balance exercise training including progression. Also discusses the mind body connection.  Reviews various relaxation techniques to help reduce and manage stress (i.e. Deep breathing, progressive muscle relaxation, and visualization). Balance handout provided to take home. Written material given at graduation.   Activity Barriers & Risk Stratification:  Activity Barriers & Cardiac Risk Stratification - 04/28/21 1231       Activity Barriers & Cardiac Risk Stratification   Activity Barriers Neck/Spine Problems;Shortness of Breath;Deconditioning;Muscular Weakness;Back Problems;Other (comment)    Comments Fractured ribs, weight restriction of 5 lbs             6 Minute Walk:  6 Minute Walk     Row Name 04/28/21 1240         6 Minute Walk   Phase Initial     Distance  1135 feet     Walk Time 6 minutes     # of Rest Breaks 0     MPH 2.14     METS 1.98     RPE 13     Perceived Dyspnea  3     VO2 Peak 6.93     Symptoms Yes (comment)     Comments Low back pain 5/10, bilateral hip pain 2/10     Resting HR 71 bpm     Resting BP 108/68     Resting Oxygen Saturation  98 %     Exercise Oxygen Saturation  during 6 min walk 96 %     Max Ex. HR 98 bpm     Max Ex. BP 142/70     2 Minute Post BP 112/72           Interval HR   1 Minute HR 87     2 Minute HR 95     3 Minute HR 96     4 Minute HR 97     5 Minute HR 98     6 Minute HR 99     2 Minute Post HR 77     Interval Heart Rate? Yes           Interval Oxygen   Interval Oxygen? Yes     Baseline Oxygen Saturation % 98 %     1 Minute Oxygen Saturation % 98 %     1 Minute Liters of Oxygen 0 L  RA     2 Minute Oxygen Saturation % 96 %     2 Minute Liters of Oxygen 0 L     3 Minute Oxygen Saturation % 98 %     3 Minute Liters of Oxygen 0 L     4 Minute Oxygen Saturation % 97 %     4 Minute Liters of Oxygen 0 L     5 Minute Oxygen Saturation % 96 %     5 Minute Liters of Oxygen 0 L     6 Minute Oxygen Saturation % 98 %  6 Minute Liters of Oxygen 0 L     2 Minute Post Oxygen Saturation % 98 %     2 Minute Post Liters of Oxygen 0 L             Oxygen Initial Assessment:  Oxygen Initial Assessment - 04/28/21 1243       Home Oxygen   Home Oxygen Device None    Sleep Oxygen Prescription None    Home Exercise Oxygen Prescription None    Home Resting Oxygen Prescription None      Initial 6 min Walk   Oxygen Used None      Program Oxygen Prescription   Program Oxygen Prescription None      Intervention   Short Term Goals To learn and demonstrate proper use of respiratory medications;To learn and demonstrate proper pursed lip breathing techniques or other breathing techniques.     Long  Term Goals Exhibits proper breathing techniques, such as pursed lip breathing or other method  taught during program session;Demonstrates proper use of MDI's;Compliance with respiratory medication             Oxygen Re-Evaluation:   Oxygen Discharge (Final Oxygen Re-Evaluation):   Initial Exercise Prescription:  Initial Exercise Prescription - 04/28/21 1200       Date of Initial Exercise RX and Referring Provider   Date 04/28/21    Referring Provider Wallene Huh MD      Treadmill   MPH 1.8    Grade 0    Minutes 15    METs 2.38      Recumbant Bike   Level 1    RPM 60    Watts 15    Minutes 15    METs 1.98      NuStep   Level 1    SPM 80    Minutes 15    METs 1.98      Biostep-RELP   Level 1    SPM 50    Minutes 15    METs 1.98      Track   Laps 19    Minutes 15    METs 1.35      Prescription Details   Frequency (times per week) 3    Duration Progress to 30 minutes of continuous aerobic without signs/symptoms of physical distress      Intensity   THRR 40-80% of Max Heartrate 102-133    Ratings of Perceived Exertion 11-13    Perceived Dyspnea 0-4      Progression   Progression Continue to progress workloads to maintain intensity without signs/symptoms of physical distress.      Resistance Training   Training Prescription Yes    Weight 2 lb   5 lb weight restriction   Reps 10-15             Perform Capillary Blood Glucose checks as needed.  Exercise Prescription Changes:   Exercise Prescription Changes     Row Name 04/28/21 1200             Response to Exercise   Blood Pressure (Admit) 108/68       Blood Pressure (Exercise) 142/70       Blood Pressure (Exit) 112/72       Heart Rate (Admit) 71 bpm       Heart Rate (Exercise) 98 bpm       Heart Rate (Exit) 74 bpm       Oxygen Saturation (Admit) 98 %  Oxygen Saturation (Exercise) 96 %       Oxygen Saturation (Exit) 98 %       Rating of Perceived Exertion (Exercise) 13       Perceived Dyspnea (Exercise) 3       Symptoms low back pain 5/10, bilateral hip pain  3/10       Comments walk test results                Exercise Comments:   Exercise Goals and Review:   Exercise Goals     Row Name 04/28/21 1303             Exercise Goals   Increase Physical Activity Yes       Intervention Provide advice, education, support and counseling about physical activity/exercise needs.;Develop an individualized exercise prescription for aerobic and resistive training based on initial evaluation findings, risk stratification, comorbidities and participant's personal goals.       Expected Outcomes Short Term: Attend rehab on a regular basis to increase amount of physical activity.;Long Term: Add in home exercise to make exercise part of routine and to increase amount of physical activity.;Long Term: Exercising regularly at least 3-5 days a week.       Increase Strength and Stamina Yes       Intervention Provide advice, education, support and counseling about physical activity/exercise needs.;Develop an individualized exercise prescription for aerobic and resistive training based on initial evaluation findings, risk stratification, comorbidities and participant's personal goals.       Expected Outcomes Short Term: Increase workloads from initial exercise prescription for resistance, speed, and METs.;Long Term: Improve cardiorespiratory fitness, muscular endurance and strength as measured by increased METs and functional capacity (6MWT);Short Term: Perform resistance training exercises routinely during rehab and add in resistance training at home       Able to understand and use rate of perceived exertion (RPE) scale Yes       Intervention Provide education and explanation on how to use RPE scale       Expected Outcomes Short Term: Able to use RPE daily in rehab to express subjective intensity level;Long Term:  Able to use RPE to guide intensity level when exercising independently       Able to understand and use Dyspnea scale Yes       Intervention Provide  education and explanation on how to use Dyspnea scale       Expected Outcomes Short Term: Able to use Dyspnea scale daily in rehab to express subjective sense of shortness of breath during exertion;Long Term: Able to use Dyspnea scale to guide intensity level when exercising independently       Knowledge and understanding of Target Heart Rate Range (THRR) Yes       Intervention Provide education and explanation of THRR including how the numbers were predicted and where they are located for reference       Expected Outcomes Short Term: Able to state/look up THRR;Long Term: Able to use THRR to govern intensity when exercising independently;Short Term: Able to use daily as guideline for intensity in rehab       Able to check pulse independently Yes       Intervention Provide education and demonstration on how to check pulse in carotid and radial arteries.;Review the importance of being able to check your own pulse for safety during independent exercise       Expected Outcomes Short Term: Able to explain why pulse checking is important during independent exercise;Long Term:  Able to check pulse independently and accurately       Understanding of Exercise Prescription Yes       Intervention Provide education, explanation, and written materials on patient's individual exercise prescription       Expected Outcomes Short Term: Able to explain program exercise prescription;Long Term: Able to explain home exercise prescription to exercise independently                Exercise Goals Re-Evaluation :   Discharge Exercise Prescription (Final Exercise Prescription Changes):  Exercise Prescription Changes - 04/28/21 1200       Response to Exercise   Blood Pressure (Admit) 108/68    Blood Pressure (Exercise) 142/70    Blood Pressure (Exit) 112/72    Heart Rate (Admit) 71 bpm    Heart Rate (Exercise) 98 bpm    Heart Rate (Exit) 74 bpm    Oxygen Saturation (Admit) 98 %    Oxygen Saturation (Exercise) 96  %    Oxygen Saturation (Exit) 98 %    Rating of Perceived Exertion (Exercise) 13    Perceived Dyspnea (Exercise) 3    Symptoms low back pain 5/10, bilateral hip pain 3/10    Comments walk test results             Nutrition:  Target Goals: Understanding of nutrition guidelines, daily intake of sodium 1500mg , cholesterol 200mg , calories 30% from fat and 7% or less from saturated fats, daily to have 5 or more servings of fruits and vegetables.  Education: All About Nutrition: -Group instruction provided by verbal, written material, interactive activities, discussions, models, and posters to present general guidelines for heart healthy nutrition including fat, fiber, MyPlate, the role of sodium in heart healthy nutrition, utilization of the nutrition label, and utilization of this knowledge for meal planning. Follow up email sent as well. Written material given at graduation.   Biometrics:  Pre Biometrics - 04/28/21 1230       Pre Biometrics   Height 5' 4.8" (1.646 m)    Weight 229 lb 9.6 oz (104.1 kg)    BMI (Calculated) 38.44    Single Leg Stand 6.91 seconds              Nutrition Therapy Plan and Nutrition Goals:  Nutrition Therapy & Goals - 04/28/21 1304       Intervention Plan   Intervention Prescribe, educate and counsel regarding individualized specific dietary modifications aiming towards targeted core components such as weight, hypertension, lipid management, diabetes, heart failure and other comorbidities.    Expected Outcomes Short Term Goal: Understand basic principles of dietary content, such as calories, fat, sodium, cholesterol and nutrients.;Short Term Goal: A plan has been developed with personal nutrition goals set during dietitian appointment.;Long Term Goal: Adherence to prescribed nutrition plan.             Nutrition Assessments:  MEDIFICTS Score Key: ?70 Need to make dietary changes  40-70 Heart Healthy Diet ? 40 Therapeutic Level  Cholesterol Diet  Flowsheet Row Pulmonary Rehab from 04/28/2021 in [facility] Cardiac and Pulmonary Rehab  Picture Your Plate Total Score on Admission 57      Picture Your Plate Scores: <10 Unhealthy dietary pattern with much room for improvement. 41-50 Dietary pattern unlikely to meet recommendations for good health and room for improvement. 51-60 More healthful dietary pattern, with some room for improvement.  >60 Healthy dietary pattern, although there may be some specific behaviors that could be improved.   Nutrition Goals Re-Evaluation:  Nutrition Goals Discharge (Final Nutrition Goals Re-Evaluation):   Psychosocial: Target Goals: Acknowledge presence or absence of significant depression and/or stress, maximize coping skills, provide positive support system. Participant is able to verbalize types and ability to use techniques and skills needed for reducing stress and depression.   Education: Stress, Anxiety, and Depression - Group verbal and visual presentation to define topics covered.  Reviews how body is impacted by stress, anxiety, and depression.  Also discusses healthy ways to reduce stress and to treat/manage anxiety and depression.  Written material given at graduation.   Education: Sleep Hygiene -Provides group verbal and written instruction about how sleep can affect your health.  Define sleep hygiene, discuss sleep cycles and impact of sleep habits. Review good sleep hygiene tips.    Initial Review & Psychosocial Screening:  Initial Psych Review & Screening - 04/13/21 0945       Initial Review   Current issues with Current Anxiety/Panic;Current Stress Concerns   at times -anxiety- with dentist.     uses Xanax   .  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   Takes him food daily.   Does not need to stay with him.      Family Dynamics   Good Support System? Yes   Son,  son in [place]     Barriers   Psychosocial barriers to participate in program There are no identifiable barriers or psychosocial needs.;The patient should benefit from training in stress management and relaxation.      Screening Interventions   Interventions Encouraged to exercise;To provide support and resources with identified psychosocial needs;Provide feedback about the scores to participant    Expected Outcomes Short Term goal: Utilizing psychosocial counselor, staff and physician to assist with identification of specific Stressors or current issues interfering with healing process. Setting desired goal for each stressor or current issue identified.;Long Term Goal: Stressors or current issues are controlled or eliminated.;Short Term goal: Identification and review with participant of any Quality of Life or Depression concerns found by scoring the questionnaire.;Long Term goal: The participant improves quality of Life and PHQ9 Scores as seen by post scores and/or verbalization of changes             Quality of Life Scores:  Scores of 19 and below usually indicate a poorer quality of life in these areas.  A difference of  2-3 points is a clinically meaningful difference.  A difference of 2-3 points in the total score of the Quality of Life Index has been associated with significant improvement in overall quality of life, self-image, physical symptoms, and general health in studies assessing change in quality of life.  PHQ-9: Recent Review Flowsheet Data     Depression screen [facility] 2/9 04/28/2021 10/08/2020 06/01/2020 03/02/2020 02/19/2019   Decreased Interest 0 0 0 0 0   Down, Depressed, Hopeless 0 0 0 0 0   PHQ - 2 Score 0 0 0 0 0   Altered sleeping 0 1 - - -   Tired, decreased energy 1 1 - - -   Change in appetite 0 0 - - -   Feeling bad or failure about yourself  0 0 - - -   Trouble concentrating 0 0 - - -   Moving slowly or fidgety/restless 0 0 - - -   Suicidal thoughts 0 0 - - -    PHQ-9 Score 1  2 - - -   Difficult doing work/chores Not difficult at all Not difficult at all - - -      Interpretation of Total Score  Total Score Depression Severity:  1-4 = Minimal depression, 5-9 = Mild depression, 10-14 = Moderate depression, 15-19 = Moderately severe depression, 20-27 = Severe depression   Psychosocial Evaluation and Intervention:  Psychosocial Evaluation - 04/13/21 1034       Psychosocial Evaluation & Interventions   Interventions Encouraged to exercise with the program and follow exercise prescription;Relaxation education    Comments Michelle Pitts is ready to start the program,she has no barriers. She has 2 sons, one close by and the other in [place]. Her son nearby was recently diagnosed with HF and DM. He also has had COVID 2 times. SHe does help him with meals. He does not require her at his home for other help. They are her support system. She has recently had bariatric surgery and continues to work on weight loss. Her physician has told her she may not need some of her medications,secondary to the surgery. 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 She has found the backroad routes to get to her son's home in [place]. She has Xanax as needed for dentist appointmments. She doe shas spinal stenosis, which can limit her standing or walking. She did the [facility] program in 2016 and remembers the equipment and staff.   She should do fine in the program.    Expected Outcomes STG Michelle Pitts attends all scheduled sessions, she continues to work on her weight loss. LTG: Michelle Pitts is able to continue to progress with her exercise and weight loss    Continue Psychosocial Services  Follow up required by staff             Psychosocial Re-Evaluation:   Psychosocial Discharge (Final Psychosocial Re-Evaluation):   Education: Education Goals: Education classes will be provided on a weekly basis, covering required topics. Participant  will state understanding/return demonstration of topics presented.  Learning Barriers/Preferences:   General Pulmonary Education Topics:  Infection Prevention: - Provides verbal and written material to individual with discussion of infection control including proper hand washing and proper equipment cleaning during exercise session. Flowsheet Row Pulmonary Rehab from 04/28/2021 in [facility] Cardiac and Pulmonary Rehab  Education need identified 04/28/21  Date 04/28/21  Educator [place]  Instruction Review Code 1- Verbalizes Understanding       Falls Prevention: - Provides verbal and written material to individual with discussion of falls prevention and safety. Flowsheet Row Pulmonary Rehab from 04/28/2021 in [facility] Cardiac and Pulmonary Rehab  Education need identified 04/28/21  Date 04/28/21  Educator [place]  Instruction Review Code 1- Verbalizes Understanding       Chronic Lung Disease Review: - Group verbal instruction with posters, models, PowerPoint presentations and videos,  to review new updates, new respiratory medications, new advancements in procedures and treatments. Providing information on websites and "800" numbers for continued self-education. Includes information about supplement oxygen, available portable oxygen systems, continuous and intermittent flow rates, oxygen safety, concentrators, and Medicare reimbursement for oxygen. Explanation of Pulmonary Drugs, including class, frequency, complications, importance of spacers, rinsing mouth after steroid MDI's, and proper cleaning methods for nebulizers. Review of basic lung anatomy and physiology related to function, structure, and complications of lung disease. Review of risk factors. Discussion about methods for diagnosing sleep apnea and types of masks and machines for OSA. Includes a review of the use of types  of environmental controls: home humidity, furnaces, filters, dust mite/pet prevention, HEPA vacuums. Discussion about weather  changes, air quality and the benefits of nasal washing. Instruction on Warning signs, infection symptoms, calling MD promptly, preventive modes, and value of vaccinations. Review of effective airway clearance, coughing and/or vibration techniques. Emphasizing that all should Create an Action Plan. Written material given at graduation. Flowsheet Row Pulmonary Rehab from 04/28/2021 in [facility] Cardiac and Pulmonary Rehab  Education need identified 04/28/21       AED/CPR: - Group verbal and written instruction with the use of models to demonstrate the basic use of the AED with the basic ABC's of resuscitation.    Anatomy and Cardiac Procedures: - Group verbal and visual presentation and models provide information about basic cardiac anatomy and function. Reviews the testing methods done to diagnose heart disease and the outcomes of the test results. Describes the treatment choices: Medical Management, Angioplasty, or Coronary Bypass Surgery for treating various heart conditions including Myocardial Infarction, Angina, Valve Disease, and Cardiac Arrhythmias.  Written material given at graduation.   Medication Safety: - Group verbal and visual instruction to review commonly prescribed medications for heart and lung disease. Reviews the medication, class of the drug, and side effects. Includes the steps to properly store meds and maintain the prescription regimen.  Written material given at graduation.   Other: -Provides group and verbal instruction on various topics (see comments)   Knowledge Questionnaire Score:  Knowledge Questionnaire Score - 04/28/21 1236       Knowledge Questionnaire Score   Pre Score 15/18: O2 sats, O2 rx              Core Components/Risk Factors/Patient Goals at Admission:  Personal Goals and Risk Factors at Admission - 04/28/21 1303       Core Components/Risk Factors/Patient Goals on Admission    Weight Management Yes;Weight Loss    Intervention Weight  Management: Develop a combined nutrition and exercise program designed to reach desired caloric intake, while maintaining appropriate intake of nutrient and fiber, sodium and fats, and appropriate energy expenditure required for the weight goal.;Weight Management: Provide education and appropriate resources to help participant work on and attain dietary goals.;Weight Management/Obesity: Establish reasonable short term and long term weight goals.    Admit Weight 229 lb (103.9 kg)    Goal Weight: Short Term 224 lb (101.6 kg)    Goal Weight: Long Term 180 lb (81.6 kg)    Expected Outcomes Short Term: Continue to assess and modify interventions until short term weight is achieved;Weight Loss: Understanding of general recommendations for a balanced deficit meal plan, which promotes 1-2 lb weight loss per week and includes a negative energy balance of [phone] kcal/d;Long Term: Adherence to nutrition and physical activity/exercise program aimed toward attainment of established weight goal;Understanding recommendations for meals to include 15-35% energy as protein, 25-35% energy from fat, 35-60% energy from carbohydrates, less than 200mg  of dietary cholesterol, 20-35 gm of total fiber daily;Understanding of distribution of calorie intake throughout the day with the consumption of 4-5 meals/snacks    Increase knowledge of respiratory medications and ability to use respiratory devices properly  Yes    Intervention Provide education and demonstration as needed of appropriate use of medications, inhalers, and oxygen therapy.    Expected Outcomes Short Term: Achieves understanding of medications use. Understands that oxygen is a medication prescribed by physician. Demonstrates appropriate use of inhaler and oxygen therapy.;Long Term: Maintain appropriate use of medications, inhalers, and oxygen therapy.  Hypertension Yes    Intervention Provide education on lifestyle modifcations including regular physical  activity/exercise, weight management, moderate sodium restriction and increased consumption of fresh fruit, vegetables, and low fat dairy, alcohol moderation, and smoking cessation.;Monitor prescription use compliance.    Expected Outcomes Short Term: Continued assessment and intervention until BP is < 140/61mm HG in hypertensive participants. < 130/25mm HG in hypertensive participants with diabetes, heart failure or chronic kidney disease.;Long Term: Maintenance of blood pressure at goal levels.    Lipids Yes    Intervention Provide education and support for participant on nutrition & aerobic/resistive exercise along with prescribed medications to achieve LDL 70mg , HDL >40mg .    Expected Outcomes Short Term: Participant states understanding of desired cholesterol values and is compliant with medications prescribed. Participant is following exercise prescription and nutrition guidelines.;Long Term: Cholesterol controlled with medications as prescribed, with individualized exercise RX and with personalized nutrition plan. Value goals: LDL < 70mg , HDL > 40 mg.             Education:Diabetes - Individual verbal and written instruction to review signs/symptoms of diabetes, desired ranges of glucose level fasting, after meals and with exercise. Acknowledge that pre and post exercise glucose checks will be done for 3 sessions at entry of program. Flowsheet Row Pulmonary Rehab from 04/28/2021 in [facility] Cardiac and Pulmonary Rehab  Education need identified 04/28/21  Date 04/28/21  Educator Clark  Instruction Review Code 1- Verbalizes Understanding       Know Your Numbers and Heart Failure: - Group verbal and visual instruction to discuss disease risk factors for cardiac and pulmonary disease and treatment options.  Reviews associated critical values for Overweight/Obesity, Hypertension, Cholesterol, and Diabetes.  Discusses basics of heart failure: signs/symptoms and treatments.  Introduces Heart Failure  Zone chart for action plan for heart failure.  Written material given at graduation.   Core Components/Risk Factors/Patient Goals Review:    Core Components/Risk Factors/Patient Goals at Discharge (Final Review):    ITP Comments:  ITP Comments     Row Name 04/13/21 1043 04/28/21 1206         ITP Comments Virtual orientation call completed today. shehas an appointment on Date: 04/28/2021  for EP eval and gym Orientation.  Documentation of diagnosis can be found in [facility] Date: 07/07/2020 . Completed 6MWT and gym orientation. Initial ITP created and sent for review to Dr. Ottie Glazier, Medical Director.               Comments: Initial ITP

## 2021-04-29 ENCOUNTER — Encounter: Payer: Medicare PPO | Admitting: *Deleted

## 2021-04-29 ENCOUNTER — Other Ambulatory Visit: Payer: Self-pay

## 2021-04-29 DIAGNOSIS — J449 Chronic obstructive pulmonary disease, unspecified: Secondary | ICD-10-CM | POA: Diagnosis not present

## 2021-04-29 NOTE — Progress Notes (Signed)
Daily Session Note  Patient Details  Name: TEMECA SOMMA MRN: [id number] Date of Birth: November 19, 1950 Referring Provider:   Flowsheet Row Pulmonary Rehab from 04/28/2021 in [facility] Cardiac and Pulmonary Rehab  Referring Provider Wallene Huh MD       Encounter Date: 04/29/2021  Check In:  Session Check In - 04/29/21 1338       Check-In   Supervising physician immediately available to respond to emergencies See telemetry face sheet for immediately available ER MD    Location [facility]-Cardiac & Pulmonary Rehab    Staff Present Renita Papa, RN BSN;Joseph [place], RCP,RRT,BSRT;Jessica [place], [place], RCEP, CCRP, CCET    Virtual Visit No    Medication changes reported     No    Fall or balance concerns reported    No    Warm-up and Cool-down Performed on first and last piece of equipment    Resistance Training Performed Yes    VAD Patient? No    PAD/SET Patient? No      Pain Assessment   Currently in Pain? No/denies                Social History   Tobacco Use  Smoking Status Former   Packs/day: 1.00   Years: 50.00   Pack years: 50.00   Types: Cigarettes   Quit date: 09/15/2014   Years since quitting: 6.6  Smokeless Tobacco Never    Goals Met:  Independence with exercise equipment Exercise tolerated well No report of concerns or symptoms today Strength training completed today  Goals Unmet:  Not Applicable  Comments: First full day of exercise!  Patient was oriented to gym and equipment including functions, settings, policies, and procedures.  Patient's individual exercise prescription and treatment plan were reviewed.  All starting workloads were established based on the results of the 6 minute walk test done at initial orientation visit.  The plan for exercise progression was also introduced and progression will be customized based on patient's performance and goals.    Dr. Emily Filbert is Medical Director for [place].  Dr. Ottie Glazier is Medical Director for [facility] Pulmonary Rehabilitation.

## 2021-04-29 NOTE — Progress Notes (Signed)
Follow-up visit:  Post-Operative sleeve Surgery  Medical Nutrition Therapy:  Appt start time: 6:00pm end time:  7:00pm  Primary concerns today: Post-operative Bariatric Surgery Nutrition Management 6 Month Post-Op Class  Pt declined weight.    Information Reviewed/ Discussed During Appointment: -Review of composition scale numbers -Fluid requirements (64-100 ounces) -Protein requirements (60-80g) -Strategies for tolerating diet -Advancement of diet to include Starchy vegetables -Barriers to inclusion of new foods -Inclusion of appropriate multivitamin and calcium supplements  -Exercise recommendations   Fluid intake: adequate   Medications: See List Supplementation: appropriate    Using straws: no Drinking while eating: no Having you been chewing well: yes Chewing/swallowing difficulties: no Changes in vision: no Changes to mood/headaches: no Hair loss/Cahnges to skin/Changes to nails: no Any difficulty focusing or concentrating: no Sweating: no Dizziness/Lightheaded: no Palpitations: no  Carbonated beverages: no N/V/D/C/GAS: no Abdominal Pain: no Dumping syndrome: no  Recent physical activity:  ADL's  Progress Towards Goal(s):  In Progress Teaching method utilized: Visual & Auditory  Demonstrated degree of understanding via: Teach Back  Readiness Level: Action Barriers to learning/adherence to lifestyle change: none identified  Handouts given during visit include: Phase V diet Progression  Goals Sheet The Benefits of Exercise are endless..... Support Group Topics   Teaching Method Utilized:  Visual Auditory Hands on  Demonstrated degree of understanding via:  Teach Back   Monitoring/Evaluation:  Dietary intake, exercise, and body weight. Follow up in 3 months for 9 month post-op visit.

## 2021-05-03 ENCOUNTER — Other Ambulatory Visit: Payer: Self-pay

## 2021-05-03 ENCOUNTER — Encounter: Payer: Medicare PPO | Admitting: *Deleted

## 2021-05-03 DIAGNOSIS — J449 Chronic obstructive pulmonary disease, unspecified: Secondary | ICD-10-CM

## 2021-05-03 NOTE — Progress Notes (Signed)
Daily Session Note  Patient Details  Name: Michelle Pitts MRN: [id number] Date of Birth: 1951-01-21 Referring Provider:   Flowsheet Row Pulmonary Rehab from 04/28/2021 in [facility] Cardiac and Pulmonary Rehab  Referring Provider Wallene Huh MD       Encounter Date: 05/03/2021  Check In:  Session Check In - 05/03/21 1332       Check-In   Supervising physician immediately available to respond to emergencies See telemetry face sheet for immediately available ER MD    Location [facility]-Cardiac & Pulmonary Rehab    Staff Present Renita Papa, RN BSN;Joseph Tessie Fass, RCP,RRT,BSRT;Kara [place], MS, ASCM CEP, Exercise Physiologist    Virtual Visit No    Medication changes reported     No    Warm-up and Cool-down Performed on first and last piece of equipment    Resistance Training Performed Yes    VAD Patient? No    PAD/SET Patient? No      Pain Assessment   Currently in Pain? No/denies                Social History   Tobacco Use  Smoking Status Former   Packs/day: 1.00   Years: 50.00   Pack years: 50.00   Types: Cigarettes   Quit date: 09/15/2014   Years since quitting: 6.6  Smokeless Tobacco Never    Goals Met:  Independence with exercise equipment Exercise tolerated well No report of concerns or symptoms today Strength training completed today  Goals Unmet:  Not Applicable  Comments: Pt able to follow exercise prescription today without complaint.  Will continue to monitor for progression.    Dr. Emily Filbert is Medical Director for [place].  Dr. Ottie Glazier is Medical Director for [facility] Pulmonary Rehabilitation.

## 2021-05-05 ENCOUNTER — Other Ambulatory Visit: Payer: Self-pay

## 2021-05-05 DIAGNOSIS — J449 Chronic obstructive pulmonary disease, unspecified: Secondary | ICD-10-CM | POA: Diagnosis not present

## 2021-05-05 NOTE — Progress Notes (Signed)
Daily Session Note  Patient Details  Name: Michelle Pitts MRN: [id number] Date of Birth: [birth year] Referring Provider:   Flowsheet Row Pulmonary Rehab from 04/28/2021 in [facility] Cardiac and Pulmonary Rehab  Referring Provider Wallene Huh MD       Encounter Date: 05/05/2021  Check In:  Session Check In - 05/05/21 1332       Check-In   Supervising physician immediately available to respond to emergencies See telemetry face sheet for immediately available ER MD    Location [facility]-Cardiac & Pulmonary Rehab    Staff Present Birdie Sons, MPA, Nino Glow, MS, ASCM CEP, Exercise Physiologist;Joseph Tessie Fass, [place]    Virtual Visit No    Medication changes reported     No    Fall or balance concerns reported    No    Warm-up and Cool-down Performed on first and last piece of equipment    Resistance Training Performed Yes    VAD Patient? No    PAD/SET Patient? No      Pain Assessment   Currently in Pain? No/denies                Social History   Tobacco Use  Smoking Status Former   Packs/day: 1.00   Years: 50.00   Pack years: 50.00   Types: Cigarettes   Quit date: 09/15/2014   Years since quitting: 6.6  Smokeless Tobacco Never    Goals Met:  Independence with exercise equipment Exercise tolerated well No report of concerns or symptoms today Strength training completed today  Goals Unmet:  Not Applicable  Comments: Pt able to follow exercise prescription today without complaint.  Will continue to monitor for progression.    Dr. Emily Filbert is Medical Director for [place].  Dr. Ottie Glazier is Medical Director for [facility] Pulmonary Rehabilitation.

## 2021-05-10 ENCOUNTER — Other Ambulatory Visit: Payer: Self-pay

## 2021-05-10 DIAGNOSIS — J449 Chronic obstructive pulmonary disease, unspecified: Secondary | ICD-10-CM | POA: Diagnosis not present

## 2021-05-10 NOTE — Progress Notes (Signed)
Daily Session Note  Patient Details  Name: Michelle Pitts MRN: [id number] Date of Birth: February 20, 1951 Referring Provider:   Flowsheet Row Pulmonary Rehab from 04/28/2021 in [facility] Cardiac and Pulmonary Rehab  Referring Provider Wallene Huh MD       Encounter Date: 05/10/2021  Check In:  Session Check In - 05/10/21 1333       Check-In   Supervising physician immediately available to respond to emergencies See telemetry face sheet for immediately available ER MD    Location [facility]-Cardiac & Pulmonary Rehab    Staff Present Birdie Sons, MPA, Nino Glow, MS, ASCM CEP, Exercise Physiologist;Amanda Oletta Darter, BA, ACSM CEP, Exercise Physiologist    Virtual Visit No    Medication changes reported     No    Fall or balance concerns reported    No    Warm-up and Cool-down Performed on first and last piece of equipment    Resistance Training Performed Yes    VAD Patient? No    PAD/SET Patient? No      Pain Assessment   Currently in Pain? No/denies                Social History   Tobacco Use  Smoking Status Former   Packs/day: 1.00   Years: 50.00   Pack years: 50.00   Types: Cigarettes   Quit date: 09/15/2014   Years since quitting: 6.6  Smokeless Tobacco Never    Goals Met:  Independence with exercise equipment Exercise tolerated well No report of concerns or symptoms today Strength training completed today  Goals Unmet:  Not Applicable  Comments: Pt able to follow exercise prescription today without complaint.  Will continue to monitor for progression.    Dr. Emily Filbert is Medical Director for [place].  Dr. Ottie Glazier is Medical Director for [facility] Pulmonary Rehabilitation.

## 2021-05-12 ENCOUNTER — Other Ambulatory Visit: Payer: Self-pay

## 2021-05-12 DIAGNOSIS — J449 Chronic obstructive pulmonary disease, unspecified: Secondary | ICD-10-CM

## 2021-05-12 NOTE — Progress Notes (Signed)
Daily Session Note  Patient Details  Name: Michelle Pitts MRN: [id number] Date of Birth: 11-15-1950 Referring Provider:   Flowsheet Row Pulmonary Rehab from 04/28/2021 in [facility] Cardiac and Pulmonary Rehab  Referring Provider Wallene Huh MD       Encounter Date: 05/12/2021  Check In:  Session Check In - 05/12/21 1328       Check-In   Supervising physician immediately available to respond to emergencies See telemetry face sheet for immediately available ER MD    Location [facility]-Cardiac & Pulmonary Rehab    Staff Present Birdie Sons, MPA, Nino Glow, MS, ASCM CEP, Exercise Physiologist;Joseph Tessie Fass, [place]    Virtual Visit No    Medication changes reported     No    Fall or balance concerns reported    No    Warm-up and Cool-down Performed on first and last piece of equipment    Resistance Training Performed Yes    VAD Patient? No    PAD/SET Patient? No      Pain Assessment   Currently in Pain? No/denies                Social History   Tobacco Use  Smoking Status Former   Packs/day: 1.00   Years: 50.00   Pack years: 50.00   Types: Cigarettes   Quit date: 09/15/2014   Years since quitting: 6.6  Smokeless Tobacco Never    Goals Met:  Independence with exercise equipment Exercise tolerated well No report of concerns or symptoms today Strength training completed today  Goals Unmet:  Not Applicable  Comments: Pt able to follow exercise prescription today without complaint.  Will continue to monitor for progression.    Dr. Emily Filbert is Medical Director for [place].  Dr. Ottie Glazier is Medical Director for [facility] Pulmonary Rehabilitation.

## 2021-05-13 ENCOUNTER — Other Ambulatory Visit: Payer: Self-pay

## 2021-05-13 ENCOUNTER — Encounter: Payer: Medicare PPO | Admitting: *Deleted

## 2021-05-13 DIAGNOSIS — J449 Chronic obstructive pulmonary disease, unspecified: Secondary | ICD-10-CM | POA: Diagnosis not present

## 2021-05-13 NOTE — Progress Notes (Signed)
Daily Session Note  Patient Details  Name: Michelle Pitts MRN: [id number] Date of Birth: [birth year] Referring Provider:   Flowsheet Row Pulmonary Rehab from 04/28/2021 in [facility] Cardiac and Pulmonary Rehab  Referring Provider Wallene Huh MD       Encounter Date: 05/13/2021  Check In:  Session Check In - 05/13/21 1339       Check-In   Supervising physician immediately available to respond to emergencies See telemetry face sheet for immediately available ER MD    Location [facility]-Cardiac & Pulmonary Rehab    Staff Present Renita Papa, RN BSN;Joseph [place], RCP,RRT,BSRT;Jessica [place], [place], RCEP, CCRP, CCET    Virtual Visit No    Medication changes reported     No    Fall or balance concerns reported    No    Warm-up and Cool-down Performed on first and last piece of equipment    Resistance Training Performed Yes    VAD Patient? No    PAD/SET Patient? No      Pain Assessment   Currently in Pain? No/denies                Social History   Tobacco Use  Smoking Status Former   Packs/day: 1.00   Years: 50.00   Pack years: 50.00   Types: Cigarettes   Quit date: 09/15/2014   Years since quitting: 6.6  Smokeless Tobacco Never    Goals Met:  Independence with exercise equipment Exercise tolerated well No report of concerns or symptoms today Strength training completed today  Goals Unmet:  Not Applicable  Comments: Pt able to follow exercise prescription today without complaint.  Will continue to monitor for progression.    Dr. Emily Filbert is Medical Director for [place].  Dr. Ottie Glazier is Medical Director for [facility] Pulmonary Rehabilitation.

## 2021-05-17 ENCOUNTER — Other Ambulatory Visit: Payer: Self-pay

## 2021-05-17 ENCOUNTER — Encounter: Payer: Medicare PPO | Attending: Specialist

## 2021-05-17 DIAGNOSIS — J449 Chronic obstructive pulmonary disease, unspecified: Secondary | ICD-10-CM | POA: Diagnosis not present

## 2021-05-17 DIAGNOSIS — Z87891 Personal history of nicotine dependence: Secondary | ICD-10-CM | POA: Insufficient documentation

## 2021-05-17 NOTE — Progress Notes (Signed)
Daily Session Note  Patient Details  Name: MANASI DISHON MRN: [id number] Date of Birth: 04-Oct-1950 Referring Provider:   Flowsheet Row Pulmonary Rehab from 04/28/2021 in [facility] Cardiac and Pulmonary Rehab  Referring Provider Wallene Huh MD       Encounter Date: 05/17/2021  Check In:  Session Check In - 05/17/21 1337       Check-In   Supervising physician immediately available to respond to emergencies See telemetry face sheet for immediately available ER MD    Location [facility]-Cardiac & Pulmonary Rehab    Staff Present Birdie Sons, MPA, Nino Glow, MS, ASCM CEP, Exercise Physiologist;Joseph Tessie Fass, [place]    Virtual Visit No    Medication changes reported     No    Fall or balance concerns reported    No    Warm-up and Cool-down Performed on first and last piece of equipment    Resistance Training Performed Yes    VAD Patient? No    PAD/SET Patient? No      Pain Assessment   Currently in Pain? No/denies                Social History   Tobacco Use  Smoking Status Former   Packs/day: 1.00   Years: 50.00   Pack years: 50.00   Types: Cigarettes   Quit date: 09/15/2014   Years since quitting: 6.6  Smokeless Tobacco Never    Goals Met:  Independence with exercise equipment Exercise tolerated well No report of concerns or symptoms today Strength training completed today  Goals Unmet:  Not Applicable  Comments: Pt able to follow exercise prescription today without complaint.  Will continue to monitor for progression.    Dr. Emily Filbert is Medical Director for [place].  Dr. Ottie Glazier is Medical Director for [facility] Pulmonary Rehabilitation.

## 2021-05-19 ENCOUNTER — Encounter: Payer: Self-pay | Admitting: *Deleted

## 2021-05-19 ENCOUNTER — Other Ambulatory Visit: Payer: Self-pay

## 2021-05-19 DIAGNOSIS — J449 Chronic obstructive pulmonary disease, unspecified: Secondary | ICD-10-CM | POA: Diagnosis not present

## 2021-05-19 NOTE — Progress Notes (Signed)
Daily Session Note  Patient Details  Name: Michelle Pitts MRN: [id number] Date of Birth: 02/08/1951 Referring Provider:   Flowsheet Row Pulmonary Rehab from 04/28/2021 in [facility] Cardiac and Pulmonary Rehab  Referring Provider Wallene Huh MD       Encounter Date: 05/19/2021  Check In:  Session Check In - 05/19/21 1334       Check-In   Supervising physician immediately available to respond to emergencies See telemetry face sheet for immediately available ER MD    Location [facility]-Cardiac & Pulmonary Rehab    Staff Present Birdie Sons, MPA, RN;Melissa [place], RDN, Tawanna Solo, MS, ASCM CEP, Exercise Physiologist    Virtual Visit No    Medication changes reported     No    Fall or balance concerns reported    No    Warm-up and Cool-down Performed on first and last piece of equipment    Resistance Training Performed Yes    VAD Patient? No    PAD/SET Patient? No      Pain Assessment   Currently in Pain? No/denies                Social History   Tobacco Use  Smoking Status Former   Packs/day: 1.00   Years: 50.00   Pack years: 50.00   Types: Cigarettes   Quit date: 09/15/2014   Years since quitting: 6.6  Smokeless Tobacco Never    Goals Met:  Independence with exercise equipment Exercise tolerated well No report of concerns or symptoms today Strength training completed today  Goals Unmet:  Not Applicable  Comments: Pt able to follow exercise prescription today without complaint.  Will continue to monitor for progression.    Dr. Emily Filbert is Medical Director for [place].  Dr. Ottie Glazier is Medical Director for [facility] Pulmonary Rehabilitation.

## 2021-05-19 NOTE — Progress Notes (Signed)
Pulmonary Individual Treatment Plan  Patient Details  Name: Michelle Pitts MRN: [id number] Date of Birth: 04/28/1951 Referring Provider:   Flowsheet Row Pulmonary Rehab from 04/28/2021 in [facility] Cardiac and Pulmonary Rehab  Referring Provider Wallene Huh MD       Initial Encounter Date:  Flowsheet Row Pulmonary Rehab from 04/28/2021 in [facility] Cardiac and Pulmonary Rehab  Date 04/28/21       Visit Diagnosis: Chronic obstructive pulmonary disease, unspecified COPD type ([place])  Patient's Home Medications on Admission:  Current Outpatient Medications:    albuterol (VENTOLIN HFA) 108 (90 Base) MCG/ACT inhaler, Inhale 1-2 puffs into the lungs every 6 (six) hours as needed for wheezing or shortness of breath., Disp: , Rfl:    ALPRAZolam (XANAX) 0.5 MG tablet, Take 1 tablet (0.5 mg total) by mouth at bedtime as needed for anxiety., Disp: 30 tablet, Rfl: 5   Cholecalciferol (VITAMIN D3) 50 MCG (2000 UT) TABS, Take 2,000 Units by mouth daily., Disp: , Rfl:    ezetimibe (ZETIA) 10 MG tablet, Take 1 tablet (10 mg total) by mouth daily., Disp: 30 tablet, Rfl: 11   fluticasone (FLONASE) 50 MCG/ACT nasal spray, Place 2 sprays into both nostrils daily., Disp: 16 g, Rfl: 6   Fluticasone-Umeclidin-Vilant (TRELEGY ELLIPTA) 100-62.5-25 MCG/INH AEPB, Inhale 1 puff into the lungs daily., Disp: , Rfl:    gabapentin (NEURONTIN) 300 MG capsule, Take 1 capsule (300 mg total) by mouth 2 (two) times daily as needed. For pain (Patient taking differently: Take 300 mg by mouth in the morning.), Disp: 180 capsule, Rfl: 1   metoprolol succinate (TOPROL-XL) 25 MG 24 hr tablet, Take 1 tablet (25 mg total) by mouth daily., Disp: 90 tablet, Rfl: 3   ondansetron (ZOFRAN-ODT) 4 MG disintegrating tablet, Take 1 tablet (4 mg total) by mouth every 6 (six) hours as needed for nausea or vomiting. (Patient not taking: Reported on 04/13/2021), Disp: 20 tablet, Rfl: 0   pantoprazole (PROTONIX) 40 MG tablet, Take 1 tablet (40 mg  total) by mouth daily. (Patient not taking: Reported on 04/13/2021), Disp: 30 tablet, Rfl: 0  Past Medical History: Past Medical History:  Diagnosis Date   Anemia    Anxiety    Breast cancer ([place]) 01/23/2014   Left breast, T1c, N0, M0. ER/PR +; Her 2 neu not overexpressed. Oncotype DX: 13,Low risk.9% over 10 years.    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, [facility]   COPD (chronic obstructive pulmonary disease) (HCC)    bronchitis   Dyspnea    with exertion   Dysrhythmia    GERD (gastroesophageal reflux disease)    History of colon polyps    Hypercholesterolemia    Hypertension    Hypothyroidism    Insomnia    Lung cancer ([place])    Papilloma of breast 01/09/2014   Personal history of radiation therapy    Personal history of tobacco use, presenting hazards to health 11/18/2015   Sleep apnea    NO CPAP   Sleep apnea    SOB (shortness of breath) on exertion    Spinal stenosis    Thyroid disease    Vitamin D deficiency     Tobacco Use: Social History   Tobacco Use  Smoking Status Former   Packs/day: 1.00   Years: 50.00   Pack years: 50.00   Types: Cigarettes   Quit date: 09/15/2014   Years since quitting: 6.6  Smokeless Tobacco Never    Labs: Recent Review Scientist, physiological     Labs for  ITP Cardiac and Pulmonary Rehab Latest Ref Rng & Units 02/19/2019 03/02/2020 05/21/2020 10/08/2020 12/22/2020   Cholestrol 100 - 199 mg/dL 224(H) 215(H) 205(H) 214(H) 162   LDLCALC 0 - 99 mg/dL 124(H) 117(H) 115(H) 110(H) 88   HDL >39 mg/dL 76 80 71 73 47   Trlycerides 0 - 149 mg/dL 119 101 96 180(H) 155(H)   Hemoglobin A1c 4.8 - 5.6 % - 5.5 5.6 5.6 5.6   PHART 7.350 - 7.450 - - - - -   PCO2ART 32.0 - 48.0 mmHg - - - - -   HCO3 20.0 - 28.0 mmol/L - - - - -   O2SAT % - - - - -        Pulmonary Assessment Scores:  Pulmonary Assessment Scores     Row Name 04/28/21 1243         ADL UCSD   ADL Phase Entry     SOB Score total 18     Rest 0     Walk 0     Stairs 2     Bath 0     Dress 0      Shop 1           CAT Score   CAT Score 10           mMRC Score   mMRC Score 2              UCSD: Self-administered rating of dyspnea associated with activities of daily living (ADLs) 6-point scale (0 = "not at all" to 5 = "maximal or unable to do because of breathlessness")  Scoring Scores range from 0 to 120.  Minimally important difference is 5 units  CAT: CAT can identify the health impairment of COPD patients and is better correlated with disease progression.  CAT has a scoring range of zero to 40. The CAT score is classified into four groups of low (less than 10), medium (10 - 20), high (21-30) and very high (31-40) based on the impact level of disease on health status. A CAT score over 10 suggests significant symptoms.  A worsening CAT score could be explained by an exacerbation, poor medication adherence, poor inhaler technique, or progression of COPD or comorbid conditions.  CAT MCID is 2 points  mMRC: mMRC (Modified Medical Research Council) Dyspnea Scale is used to assess the degree of baseline functional disability in patients of respiratory disease due to dyspnea. No minimal important difference is established. A decrease in score of 1 point or greater is considered a positive change.   Pulmonary Function Assessment:   Exercise Target Goals: Exercise Program Goal: Individual exercise prescription set using results from initial 6 min walk test and THRR while considering  patient's activity barriers and safety.   Exercise Prescription Goal: Initial exercise prescription builds to 30-45 minutes a day of aerobic activity, 2-3 days per week.  Home exercise guidelines will be given to patient during program as part of exercise prescription that the participant will acknowledge.  Education: Aerobic Exercise: - Group verbal and visual presentation on the components of exercise prescription. Introduces F.I.T.T principle from ACSM for exercise prescriptions.  Reviews  F.I.T.T. principles of aerobic exercise including progression. Written material given at graduation.   Education: Resistance Exercise: - Group verbal and visual presentation on the components of exercise prescription. Introduces F.I.T.T principle from ACSM for exercise prescriptions  Reviews F.I.T.T. principles of resistance exercise including progression. Written material given at graduation.    Education: Exercise & Equipment  Safety: - Individual verbal instruction and demonstration of equipment use and safety with use of the equipment.   Education: Exercise Physiology & General Exercise Guidelines: - Group verbal and written instruction with models to review the exercise physiology of the cardiovascular system and associated critical values. Provides general exercise guidelines with specific guidelines to those with heart or lung disease.    Education: Flexibility, Balance, Mind/Body Relaxation: - Group verbal and visual presentation with interactive activity on the components of exercise prescription. Introduces F.I.T.T principle from ACSM for exercise prescriptions. Reviews F.I.T.T. principles of flexibility and balance exercise training including progression. Also discusses the mind body connection.  Reviews various relaxation techniques to help reduce and manage stress (i.e. Deep breathing, progressive muscle relaxation, and visualization). Balance handout provided to take home. Written material given at graduation.   Activity Barriers & Risk Stratification:  Activity Barriers & Cardiac Risk Stratification - 04/28/21 1231       Activity Barriers & Cardiac Risk Stratification   Activity Barriers Neck/Spine Problems;Shortness of Breath;Deconditioning;Muscular Weakness;Back Problems;Other (comment)    Comments Fractured ribs, weight restriction of 5 lbs             6 Minute Walk:  6 Minute Walk     Row Name 04/28/21 1240         6 Minute Walk   Phase Initial     Distance  1135 feet     Walk Time 6 minutes     # of Rest Breaks 0     MPH 2.14     METS 1.98     RPE 13     Perceived Dyspnea  3     VO2 Peak 6.93     Symptoms Yes (comment)     Comments Low back pain 5/10, bilateral hip pain 2/10     Resting HR 71 bpm     Resting BP 108/68     Resting Oxygen Saturation  98 %     Exercise Oxygen Saturation  during 6 min walk 96 %     Max Ex. HR 98 bpm     Max Ex. BP 142/70     2 Minute Post BP 112/72           Interval HR   1 Minute HR 87     2 Minute HR 95     3 Minute HR 96     4 Minute HR 97     5 Minute HR 98     6 Minute HR 99     2 Minute Post HR 77     Interval Heart Rate? Yes           Interval Oxygen   Interval Oxygen? Yes     Baseline Oxygen Saturation % 98 %     1 Minute Oxygen Saturation % 98 %     1 Minute Liters of Oxygen 0 L  RA     2 Minute Oxygen Saturation % 96 %     2 Minute Liters of Oxygen 0 L     3 Minute Oxygen Saturation % 98 %     3 Minute Liters of Oxygen 0 L     4 Minute Oxygen Saturation % 97 %     4 Minute Liters of Oxygen 0 L     5 Minute Oxygen Saturation % 96 %     5 Minute Liters of Oxygen 0 L     6 Minute Oxygen Saturation % 98 %  6 Minute Liters of Oxygen 0 L     2 Minute Post Oxygen Saturation % 98 %     2 Minute Post Liters of Oxygen 0 L             Oxygen Initial Assessment:  Oxygen Initial Assessment - 04/28/21 1243       Home Oxygen   Home Oxygen Device None    Sleep Oxygen Prescription None    Home Exercise Oxygen Prescription None    Home Resting Oxygen Prescription None      Initial 6 min Walk   Oxygen Used None      Program Oxygen Prescription   Program Oxygen Prescription None      Intervention   Short Term Goals To learn and demonstrate proper use of respiratory medications;To learn and demonstrate proper pursed lip breathing techniques or other breathing techniques.     Long  Term Goals Exhibits proper breathing techniques, such as pursed lip breathing or other method  taught during program session;Demonstrates proper use of MDI's;Compliance with respiratory medication             Oxygen Re-Evaluation:  Oxygen Re-Evaluation     Rennert Name 04/29/21 1341             Program Oxygen Prescription   Program Oxygen Prescription None               Home Oxygen   Home Oxygen Device None       Sleep Oxygen Prescription None       Home Exercise Oxygen Prescription None       Home Resting Oxygen Prescription None               Goals/Expected Outcomes   Short Term Goals To learn and demonstrate proper use of respiratory medications;To learn and demonstrate proper pursed lip breathing techniques or other breathing techniques.        Long  Term Goals Exhibits proper breathing techniques, such as pursed lip breathing or other method taught during program session;Demonstrates proper use of MDI's;Compliance with respiratory medication       Comments Reviewed PLB technique with pt.  Talked about how it works and it's importance in maintaining their exercise saturations.       Goals/Expected Outcomes Short: Become more profiecient at using PLB.   Long: Become independent at using PLB.                Oxygen Discharge (Final Oxygen Re-Evaluation):  Oxygen Re-Evaluation - 04/29/21 1341       Program Oxygen Prescription   Program Oxygen Prescription None      Home Oxygen   Home Oxygen Device None    Sleep Oxygen Prescription None    Home Exercise Oxygen Prescription None    Home Resting Oxygen Prescription None      Goals/Expected Outcomes   Short Term Goals To learn and demonstrate proper use of respiratory medications;To learn and demonstrate proper pursed lip breathing techniques or other breathing techniques.     Long  Term Goals Exhibits proper breathing techniques, such as pursed lip breathing or other method taught during program session;Demonstrates proper use of MDI's;Compliance with respiratory medication    Comments Reviewed PLB technique  with pt.  Talked about how it works and it's importance in maintaining their exercise saturations.    Goals/Expected Outcomes Short: Become more profiecient at using PLB.   Long: Become independent at using PLB.  Initial Exercise Prescription:  Initial Exercise Prescription - 04/28/21 1200       Date of Initial Exercise RX and Referring Provider   Date 04/28/21    Referring Provider Wallene Huh MD      Treadmill   MPH 1.8    Grade 0    Minutes 15    METs 2.38      Recumbant Bike   Level 1    RPM 60    Watts 15    Minutes 15    METs 1.98      NuStep   Level 1    SPM 80    Minutes 15    METs 1.98      Biostep-RELP   Level 1    SPM 50    Minutes 15    METs 1.98      Track   Laps 19    Minutes 15    METs 1.35      Prescription Details   Frequency (times per week) 3    Duration Progress to 30 minutes of continuous aerobic without signs/symptoms of physical distress      Intensity   THRR 40-80% of Max Heartrate 102-133    Ratings of Perceived Exertion 11-13    Perceived Dyspnea 0-4      Progression   Progression Continue to progress workloads to maintain intensity without signs/symptoms of physical distress.      Resistance Training   Training Prescription Yes    Weight 2 lb   5 lb weight restriction   Reps 10-15             Perform Capillary Blood Glucose checks as needed.  Exercise Prescription Changes:   Exercise Prescription Changes     Row Name 04/28/21 1200 05/03/21 1000 05/12/21 1500 05/18/21 0700       Response to Exercise   Blood Pressure (Admit) 108/68 146/70 -- 104/64    Blood Pressure (Exercise) 142/70 142/60 -- 148/70    Blood Pressure (Exit) 112/72 126/70 -- 114/66    Heart Rate (Admit) 71 bpm 74 bpm -- 71 bpm    Heart Rate (Exercise) 98 bpm 87 bpm -- 98 bpm    Heart Rate (Exit) 74 bpm 88 bpm -- 81 bpm    Oxygen Saturation (Admit) 98 % 96 % -- 98 %    Oxygen Saturation (Exercise) 96 % 94 % -- 93 %    Oxygen  Saturation (Exit) 98 % 96 % -- 97 %    Rating of Perceived Exertion (Exercise) 13 15 -- 13    Perceived Dyspnea (Exercise) 3 3 -- 2    Symptoms low back pain 5/10, bilateral hip pain 3/10 SOB -- SOB    Comments walk test results 1st full day of exercise -- --    Duration -- Progress to 30 minutes of  aerobic without signs/symptoms of physical distress -- Progress to 30 minutes of  aerobic without signs/symptoms of physical distress    Intensity -- THRR unchanged -- THRR unchanged         Progression   Progression -- Continue to progress workloads to maintain intensity without signs/symptoms of physical distress. -- Continue to progress workloads to maintain intensity without signs/symptoms of physical distress.    Average METs -- 2.7 -- 2         Resistance Training   Training Prescription -- Yes -- Yes    Weight -- 2 lb -- 2 lb    Reps -- 10-15 --  10-15         Interval Training   Interval Training -- No -- No         Recumbant Bike   Level -- 1 -- --    Minutes -- 15 -- --    METs -- 2.75 -- --         NuStep   Level -- 2 -- --    Minutes -- 15 -- --    METs -- 2.8 -- --         Biostep-RELP   Level -- -- -- 2    Minutes -- -- -- 15    METs -- -- -- 2         Track   Laps -- -- -- 17    Minutes -- -- -- 15    METs -- -- -- 1.92         Home Exercise Plan   Plans to continue exercise at -- -- Home (comment)  walking, Youtube videos Home (comment)  walking, Youtube videos    Frequency -- -- Add 2 additional days to program exercise sessions.  start with 1 Add 2 additional days to program exercise sessions.  start with 1    Initial Home Exercises Provided -- -- 05/12/21 05/12/21             Exercise Comments:   Exercise Goals and Review:   Exercise Goals     Row Name 04/28/21 1303             Exercise Goals   Increase Physical Activity Yes       Intervention Provide advice, education, support and counseling about physical activity/exercise  needs.;Develop an individualized exercise prescription for aerobic and resistive training based on initial evaluation findings, risk stratification, comorbidities and participant's personal goals.       Expected Outcomes Short Term: Attend rehab on a regular basis to increase amount of physical activity.;Long Term: Add in home exercise to make exercise part of routine and to increase amount of physical activity.;Long Term: Exercising regularly at least 3-5 days a week.       Increase Strength and Stamina Yes       Intervention Provide advice, education, support and counseling about physical activity/exercise needs.;Develop an individualized exercise prescription for aerobic and resistive training based on initial evaluation findings, risk stratification, comorbidities and participant's personal goals.       Expected Outcomes Short Term: Increase workloads from initial exercise prescription for resistance, speed, and METs.;Long Term: Improve cardiorespiratory fitness, muscular endurance and strength as measured by increased METs and functional capacity (6MWT);Short Term: Perform resistance training exercises routinely during rehab and add in resistance training at home       Able to understand and use rate of perceived exertion (RPE) scale Yes       Intervention Provide education and explanation on how to use RPE scale       Expected Outcomes Short Term: Able to use RPE daily in rehab to express subjective intensity level;Long Term:  Able to use RPE to guide intensity level when exercising independently       Able to understand and use Dyspnea scale Yes       Intervention Provide education and explanation on how to use Dyspnea scale       Expected Outcomes Short Term: Able to use Dyspnea scale daily in rehab to express subjective sense of shortness of breath during exertion;Long Term: Able to use Dyspnea scale to guide  intensity level when exercising independently       Knowledge and understanding of  Target Heart Rate Range (THRR) Yes       Intervention Provide education and explanation of THRR including how the numbers were predicted and where they are located for reference       Expected Outcomes Short Term: Able to state/look up THRR;Long Term: Able to use THRR to govern intensity when exercising independently;Short Term: Able to use daily as guideline for intensity in rehab       Able to check pulse independently Yes       Intervention Provide education and demonstration on how to check pulse in carotid and radial arteries.;Review the importance of being able to check your own pulse for safety during independent exercise       Expected Outcomes Short Term: Able to explain why pulse checking is important during independent exercise;Long Term: Able to check pulse independently and accurately       Understanding of Exercise Prescription Yes       Intervention Provide education, explanation, and written materials on patient's individual exercise prescription       Expected Outcomes Short Term: Able to explain program exercise prescription;Long Term: Able to explain home exercise prescription to exercise independently                Exercise Goals Re-Evaluation :  Exercise Goals Re-Evaluation     Row Name 04/29/21 1340 05/03/21 1042 05/12/21 1508 05/18/21 0726       Exercise Goal Re-Evaluation   Exercise Goals Review Increase Physical Activity;Able to understand and use rate of perceived exertion (RPE) scale;Knowledge and understanding of Target Heart Rate Range (THRR);Understanding of Exercise Prescription;Able to understand and use Dyspnea scale;Increase Strength and Stamina;Able to check pulse independently Increase Physical Activity;Understanding of Exercise Prescription;Increase Strength and Stamina Increase Physical Activity;Understanding of Exercise Prescription;Increase Strength and Stamina --    Comments Reviewed RPE and dyspnea scales, THR and program prescription with pt today.   Pt voiced understanding and was given a copy of goals to take home. Jolaine Artist is doing well for her first day of exercise. We will continue to monitor her workloads and see how exercise is tolerated throughout her time in the program. Reviewed home exercise with pt today.  Pt plans to walk and do Youtube videos for exercise.  Reviewed THR, pulse, RPE, sign and symptoms, pulse oximetery and when to call 911 or MD.  Also discussed weather considerations and indoor options.  Pt voiced understanding. Jolaine Artist is doing well with exercise in her first sessions.  Oxygen has remained in the 90s on room air.  We will continue to monitor progress.    Expected Outcomes Short: Use RPE daily to regulate intensity. Long: Follow program prescription in THR. Short: Cotinue exercise prescription as tolerated Long: Increase overall MET level Short: Add 1 day of exercise at home Long: Exercise independently at appropriate prescription Short: attend consistently Long: build overall stamina             Discharge Exercise Prescription (Final Exercise Prescription Changes):  Exercise Prescription Changes - 05/18/21 0700       Response to Exercise   Blood Pressure (Admit) 104/64    Blood Pressure (Exercise) 148/70    Blood Pressure (Exit) 114/66    Heart Rate (Admit) 71 bpm    Heart Rate (Exercise) 98 bpm    Heart Rate (Exit) 81 bpm    Oxygen Saturation (Admit) 98 %    Oxygen Saturation (Exercise)  93 %    Oxygen Saturation (Exit) 97 %    Rating of Perceived Exertion (Exercise) 13    Perceived Dyspnea (Exercise) 2    Symptoms SOB    Duration Progress to 30 minutes of  aerobic without signs/symptoms of physical distress    Intensity THRR unchanged      Progression   Progression Continue to progress workloads to maintain intensity without signs/symptoms of physical distress.    Average METs 2      Resistance Training   Training Prescription Yes    Weight 2 lb    Reps 10-15      Interval Training   Interval  Training No      Biostep-RELP   Level 2    Minutes 15    METs 2      Track   Laps 17    Minutes 15    METs 1.92      Home Exercise Plan   Plans to continue exercise at Home (comment)   walking, Youtube videos   Frequency Add 2 additional days to program exercise sessions.   start with 1   Initial Home Exercises Provided 05/12/21             Nutrition:  Target Goals: Understanding of nutrition guidelines, daily intake of sodium <1583m, cholesterol <2037m calories 30% from fat and 7% or less from saturated fats, daily to have 5 or more servings of fruits and vegetables.  Education: All About Nutrition: -Group instruction provided by verbal, written material, interactive activities, discussions, models, and posters to present general guidelines for heart healthy nutrition including fat, fiber, MyPlate, the role of sodium in heart healthy nutrition, utilization of the nutrition label, and utilization of this knowledge for meal planning. Follow up email sent as well. Written material given at graduation.   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 40 Therapeutic Level Cholesterol Diet  [facility]  Picture Your Plate Total Score on Admission 57      Picture Your Plate Scores: <4<56nhealthy dietary pattern with much room for improvement. 41-50 Dietary pattern unlikely to meet recommendations  for good health and room for improvement. 51-60 More healthful dietary pattern, with some room for improvement.  >60 Healthy dietary pattern, although there may be some specific behaviors that could be improved.   Nutrition Goals Re-Evaluation:   Nutrition Goals Discharge (Final Nutrition Goals Re-Evaluation):   Psychosocial: Target Goals: Acknowledge presence or absence of significant depression and/or stress, maximize coping skills, provide positive support system. Participant is able to verbalize types and ability to use techniques and skills needed for reducing stress and depression.   Education: Stress, Anxiety, and Depression - Group verbal and visual presentation to define  topics covered.  Reviews how body is impacted by stress, anxiety, and depression.  Also discusses healthy ways to reduce stress and to treat/manage anxiety and depression.  Written material given at graduation.   Education: Sleep Hygiene -Provides group verbal and written instruction about how sleep can affect your health.  Define sleep hygiene, discuss sleep cycles and impact of sleep habits. Review good sleep hygiene tips.    Initial Review & Psychosocial Screening:  Initial Psych Review & Screening - 04/13/21 0945       Initial Review   Current issues with Current Anxiety/Panic;Current Stress Concerns   at times -anxiety- with dentist.     uses Xanax   .  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   Takes him food daily.   Does not need to stay with him.      Family Dynamics   Good Support System? Yes   Son, son in [place]     Barriers   Psychosocial barriers to participate in program There are no identifiable barriers or psychosocial needs.;The patient should benefit from training in stress management and relaxation.      Screening Interventions   Interventions Encouraged to exercise;To provide  support and resources with identified psychosocial needs;Provide feedback about the scores to participant    Expected Outcomes Short Term goal: Utilizing psychosocial counselor, staff and physician to assist with identification of specific Stressors or current issues interfering with healing process. Setting desired goal for each stressor or current issue identified.;Long Term Goal: Stressors or current issues are controlled or eliminated.;Short Term goal: Identification and review with participant of any Quality of Life or Depression concerns found by scoring the questionnaire.;Long Term goal: The participant improves quality of Life and PHQ9 Scores as seen by post scores and/or verbalization of changes             Quality of Life Scores:  Scores of 19 and below usually indicate a poorer quality of life in these areas.  A difference of  2-3 points is a clinically meaningful difference.  A difference of 2-3 points in the total score of the Quality of Life Index has been associated with significant improvement in overall quality of life, self-image, physical symptoms, and general health in studies assessing change in quality of life.  PHQ-9: Recent Review Flowsheet Data     Depression screen [facility] 2/9 04/28/2021 10/08/2020 06/01/2020 03/02/2020 02/19/2019   Decreased Interest 0 0 0 0 0   Down, Depressed, Hopeless 0 0 0 0 0   PHQ - 2 Score 0 0 0 0 0   Altered sleeping 0 1 - - -   Tired, decreased energy 1 1 - - -   Change in appetite 0 0 - - -   Feeling bad or failure about yourself  0 0 - - -   Trouble concentrating 0 0 - - -   Moving slowly or fidgety/restless 0 0 - - -   Suicidal thoughts 0 0 - - -   PHQ-9 Score 1 2 - - -   Difficult doing work/chores Not difficult at all Not difficult at all - - -      Interpretation of Total Score  Total Score Depression Severity:  1-4 = Minimal depression, 5-9 = Mild depression, 10-14 = Moderate depression, 15-19 = Moderately severe depression, 20-27 =  Severe depression   Psychosocial Evaluation and Intervention:  Psychosocial Evaluation - 04/13/21 1034       Psychosocial Evaluation & Interventions   Interventions Encouraged to exercise with the program and follow exercise prescription;Relaxation education    Comments Jolaine Artist is ready to start the program,she has no barriers. She has 2 sons, one close by and the other in [place]. Her son nearby was recently diagnosed with HF and DM. He also has had COVID 2 times. SHe does help him with meals. He does not require her at his home for other help. They are her support system. She has recently had bariatric surgery and continues to work on weight loss. Her physician has told her she may not need some of her medications,secondary to the surgery. 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 She has found the backroad routes to get to her son's home in [place]. She has Xanax as needed for dentist appointmments. She doe shas spinal stenosis, which can limit her standing or walking. She did the [facility] program in 2016 and remembers the equipment and staff.   She should do fine in the program.    Expected Outcomes STG Candi attends all scheduled sessions, she continues to work on her weight loss. LTG: Candi is able to continue to progress with her exercise and weight loss    Continue Psychosocial Services  Follow up required by staff             Psychosocial Re-Evaluation:   Psychosocial Discharge (Final Psychosocial Re-Evaluation):   Education: Education Goals: Education classes will be provided on a weekly basis, covering required topics. Participant will state understanding/return demonstration of topics presented.  Learning Barriers/Preferences:   General Pulmonary Education Topics:  Infection Prevention: - Provides verbal and written material to individual with discussion of infection control including proper hand washing and proper  equipment cleaning during exercise session. Flowsheet Row Pulmonary Rehab from 04/28/2021 in [facility] Cardiac and Pulmonary Rehab  Education need identified 04/28/21  Date 04/28/21  Educator [place]  Instruction Review Code 1- Verbalizes Understanding       Falls Prevention: - Provides verbal and written material to individual with discussion of falls prevention and safety. Flowsheet Row Pulmonary Rehab from 04/28/2021 in [facility] Card[facility] and Pulmonary Rehab  Education need identified 04/28/21  Date 04/28/21  Educator [place]  Instruction Review Code 1- Verbalizes Understanding       Chronic Lung Disease Review: - Group verbal instruction with posters, models, PowerPoint presentations and videos,  to review new updates, new respiratory medications, new advancements in procedures and treatments. Providing information on websites and "800" numbers for continued self-education. Includes information about supplement oxygen, available portable oxygen systems, continuous and intermittent flow rates, oxygen safety, concentrators, and Medicare reimbursement for oxygen. Explanation of Pulmonary Drugs, including class, frequency, complications, importance of spacers, rinsing mouth after steroid MDI's, and proper cleaning methods for nebulizers. Review of basic lung anatomy and physiology related to function, structure, and complications of lung disease. Review of risk factors. Discussion about methods for diagnosing sleep apnea and types of masks and machines for OSA. Includes a review of the use of types of environmental controls: home humidity, furnaces, filters, dust mite/pet prevention, HEPA vacuums. Discussion about weather changes, air quality and the benefits of nasal washing. Instruction on Warning signs, infection symptoms, calling MD promptly, preventive modes, and value of vaccinations. Review of effective airway clearance, coughing and/or vibration techniques. Emphasizing that all should Create an Action Plan.  Written material given at graduation. Flowsheet  Row Pulmonary Rehab from 04/28/2021 in [facility] Cardiac and Pulmonary Rehab  Education need identified 04/28/21       AED/CPR: - Group verbal and written instruction with the use of models to demonstrate the basic use of the AED with the basic ABC's of resuscitation.    Anatomy and Cardiac Procedures: - Group verbal and visual presentation and models provide information about basic cardiac anatomy and function. Reviews the testing methods done to diagnose heart disease and the outcomes of the test results. Describes the treatment choices: Medical Management, Angioplasty, or Coronary Bypass Surgery for treating various heart conditions including Myocardial Infarction, Angina, Valve Disease, and Cardiac Arrhythmias.  Written material given at graduation.   Medication Safety: - Group verbal and visual instruction to review commonly prescribed medications for heart and lung disease. Reviews the medication, class of the drug, and side effects. Includes the steps to properly store meds and maintain the prescription regimen.  Written material given at graduation.   Other: -Provides group and verbal instruction on various topics (see comments)   Knowledge Questionnaire Score:  Knowledge Questionnaire Score - 04/28/21 1236       Knowledge Questionnaire Score   Pre Score 15/18: O2 sats, O2 rx              Core Components/Risk Factors/Patient Goals at Admission:  Personal Goals and Risk Factors at Admission - 04/28/21 1303       Core Components/Risk Factors/Patient Goals on Admission    Weight Management Yes;Weight Loss    Intervention Weight Management: Develop a combined nutrition and exercise program designed to reach desired caloric intake, while maintaining appropriate intake of nutrient and fiber, sodium and fats, and appropriate energy expenditure required for the weight goal.;Weight Management: Provide education and appropriate  resources to help participant work on and attain dietary goals.;Weight Management/Obesity: Establish reasonable short term and long term weight goals.    Admit Weight 229 lb (103.9 kg)    Goal Weight: Short Term 224 lb (101.6 kg)    Goal Weight: Long Term 180 lb (81.6 kg)    Expected Outcomes Short Term: Continue to assess and modify interventions until short term weight is achieved;Weight Loss: Understanding of general recommendations for a balanced deficit meal plan, which promotes 1-2 lb weight loss per week and includes a negative energy balance of [phone] kcal/d;Long Term: Adherence to nutrition and physical activity/exercise program aimed toward attainment of established weight goal;Understanding recommendations for meals to include 15-35% energy as protein, 25-35% energy from fat, 35-60% energy from carbohydrates, less than 259m of dietary cholesterol, 20-35 gm of total fiber daily;Understanding of distribution of calorie intake throughout the day with the consumption of 4-5 meals/snacks    Increase knowledge of respiratory medications and ability to use respiratory devices properly  Yes    Intervention Provide education and demonstration as needed of appropriate use of medications, inhalers, and oxygen therapy.    Expected Outcomes Short Term: Achieves understanding of medications use. Understands that oxygen is a medication prescribed by physician. Demonstrates appropriate use of inhaler and oxygen therapy.;Long Term: Maintain appropriate use of medications, inhalers, and oxygen therapy.    Hypertension Yes    Intervention Provide education on lifestyle modifcations including regular physical activity/exercise, weight management, moderate sodium restriction and increased consumption of fresh fruit, vegetables, and low fat dairy, alcohol moderation, and smoking cessation.;Monitor prescription use compliance.    Expected Outcomes Short Term: Continued assessment and intervention until BP is <  140/942mHG in hypertensive participants. < 130/8029mG  in hypertensive participants with diabetes, heart failure or chronic kidney disease.;Long Term: Maintenance of blood pressure at goal levels.    Lipids Yes    Intervention Provide education and support for participant on nutrition & aerobic/resistive exercise along with prescribed medications to achieve LDL <27m, HDL >47m    Expected Outcomes Short Term: Participant states understanding of desired cholesterol values and is compliant with medications prescribed. Participant is following exercise prescription and nutrition guidelines.;Long Term: Cholesterol controlled with medications as prescribed, with individualized exercise RX and with personalized nutrition plan. Value goals: LDL < 7037mHDL > 40 mg.             Education:Diabetes - Individual verbal and written instruction to review signs/symptoms of diabetes, desired ranges of glucose level fasting, after meals and with exercise. Acknowledge that pre and post exercise glucose checks will be done for 3 sessions at entry of program. Flowsheet [facility]  Education need identified 04/28/21  Date 04/28/21  Educator [place]struction Review Code 1- Verbalizes Understanding       Know Your Numbers and Heart Failure: - Group verbal and visual instruction to discuss disease risk factors for cardiac and pulmonary disease and treatment options.  Reviews associated critical values for Overweight/Obesity, Hypertension, Cholesterol, and Diabetes.  Discusses basics of heart failure: signs/symptoms and treatments.  Introduces Heart Failure Zone chart for action plan for heart failure.  Written material given at graduation.   Core Components/Risk Factors/Patient Goals Review:    Core Components/Risk Factors/Patient Goals at Discharge (Final Review):    ITP Comments:  ITP Comments     Row Name 04/13/21 1043 04/28/21 1206  04/29/21 1340 05/19/21 1225     ITP Comments Virtual orientation call completed today. shehas an appointment on Date: 04/28/2021  for EP eval and gym Orientation.  [facility] . Completed 6MWT and gym orientation. Initial ITP created and sent for review to Dr. FuaOttie Glazieredical Director. First full day of exercise!  Patient was oriented to gym and equipment including functions, settings, policies, and procedures.  Patient's individual exercise prescription and treatment plan were reviewed.  All starting workloads were established based on the results of the 6 minute walk test done at initial orientation visit.  The plan for exercise progression was also introduced and progression will be customized based on patient's performance and goals. 30 day review completed. ITP sent to Dr. FauZetta Billsedical Director of  Pulmonary Rehab. Continue with ITP unless changes are made by physician.             Comments: 30 day review

## 2021-05-20 ENCOUNTER — Other Ambulatory Visit: Payer: Self-pay

## 2021-05-20 ENCOUNTER — Encounter: Payer: Medicare PPO | Admitting: *Deleted

## 2021-05-20 DIAGNOSIS — J449 Chronic obstructive pulmonary disease, unspecified: Secondary | ICD-10-CM | POA: Diagnosis not present

## 2021-05-20 NOTE — Progress Notes (Signed)
Daily Session Note  Patient Details  Name: Michelle Pitts MRN: [id number] Date of Birth: [birth year] Referring Provider:   Flowsheet Row Pulmonary Rehab from 04/28/2021 in [facility] Cardiac and Pulmonary Rehab  Referring Provider Wallene Huh MD       Encounter Date: 05/20/2021  Check In:  Session Check In - 05/20/21 1330       Check-In   Supervising physician immediately available to respond to emergencies See telemetry face sheet for immediately available ER MD    Location [facility]-Cardiac & Pulmonary Rehab    Staff Present Renita Papa, RN BSN;Joseph Quebrad[place], RCP,RRT,BSRT;Jessica [place], [place], RCEP, CCRP, CCET    Virtual Visit No    Medication changes reported     No    Fall or balance concerns reported    No    Warm-up and Cool-down Performed on first and last piece of equipment    Resistance Training Performed Yes    VAD Patient? No    PAD/SET Patient? No      Pain Assessment   Currently in Pain? No/denies                Social History   Tobacco Use  Smoking Status Former   Packs/day: 1.00   Years: 50.00   Pack years: 50.00   Types: Cigarettes   Quit date: 09/15/2014   Years since quitting: 6.6  Smokeless Tobacco Never    Goals Met:  Independence with exercise equipment Exercise tolerated well No report of concerns or symptoms today Strength training completed today  Goals Unmet:  Not Applicable  Comments: Pt able to follow exercise prescription today without complaint.  Will continue to monitor for progression.    Dr. Emily Filbert is Medical Director for [facility].  Dr. Ottie Glazier is Medical Director for [facility] Pulmonary Rehabilitation.

## 2021-05-24 ENCOUNTER — Other Ambulatory Visit: Payer: Self-pay

## 2021-05-24 DIAGNOSIS — J449 Chronic obstructive pulmonary disease, unspecified: Secondary | ICD-10-CM | POA: Diagnosis not present

## 2021-05-24 NOTE — Progress Notes (Signed)
Daily Session Note  Patient Details  Name: Zulema L Folker MRN: [id number] Date of Birth: 12/20/1950 Referring Provider:   Flowsheet Row Pulmonary Rehab from 04/28/2021 in [facility] Cardiac and Pulmonary Rehab  Referring Provider Fleming, Herbon MD       Encounter Date: 05/24/2021  Check In:  Session Check In - 05/24/21 1334       Check-In   Supervising physician immediately available to respond to emergencies See telemetry face sheet for immediately available ER MD    Location [facility]-Cardiac & Pulmonary Rehab    Staff Present Kelly Bollinger, MPA, RN;Kara Langdon, MS, ASCM CEP, Exercise Physiologist;Joseph Hood, RCP,RRT,BSRT    Virtual Visit No    Medication changes reported     No    Fall or balance concerns reported    No    Warm-up and Cool-down Performed on first and last piece of equipment    Resistance Training Performed Yes    VAD Patient? No    PAD/SET Patient? No      Pain Assessment   Currently in Pain? No/denies                Social History   Tobacco Use  Smoking Status Former   Packs/day: 1.00   Years: 50.00   Pack years: 50.00   Types: Cigarettes   Quit date: 09/15/2014   Years since quitting: 6.6  Smokeless Tobacco Never    Goals Met:  Independence with exercise equipment Exercise tolerated well No report of concerns or symptoms today Strength training completed today  Goals Unmet:  Not Applicable  Comments: Pt able to follow exercise prescription today without complaint.  Will continue to monitor for progression.    Dr. Mark Miller is Medical Director for [facility].  Dr. Fuad Aleskerov is Medical Director for [facility] Pulmonary Rehabilitation. 

## 2021-05-26 ENCOUNTER — Other Ambulatory Visit: Payer: Medicare PPO

## 2021-05-26 ENCOUNTER — Encounter: Payer: Self-pay | Admitting: Internal Medicine

## 2021-05-26 ENCOUNTER — Ambulatory Visit: Payer: Medicare PPO | Admitting: Internal Medicine

## 2021-05-26 ENCOUNTER — Inpatient Hospital Stay (HOSPITAL_BASED_OUTPATIENT_CLINIC_OR_DEPARTMENT_OTHER): Payer: Medicare PPO | Admitting: Internal Medicine

## 2021-05-26 ENCOUNTER — Inpatient Hospital Stay: Payer: Medicare PPO | Attending: Internal Medicine

## 2021-05-26 VITALS — BP 131/76 | HR 75 | Temp 99.2°F | Wt 226.2 lb

## 2021-05-26 DIAGNOSIS — E7800 Pure hypercholesterolemia, unspecified: Secondary | ICD-10-CM | POA: Insufficient documentation

## 2021-05-26 DIAGNOSIS — K219 Gastro-esophageal reflux disease without esophagitis: Secondary | ICD-10-CM | POA: Diagnosis not present

## 2021-05-26 DIAGNOSIS — Z803 Family history of malignant neoplasm of breast: Secondary | ICD-10-CM | POA: Diagnosis not present

## 2021-05-26 DIAGNOSIS — C3431 Malignant neoplasm of lower lobe, right bronchus or lung: Secondary | ICD-10-CM | POA: Diagnosis present

## 2021-05-26 DIAGNOSIS — Z86010 Personal history of colonic polyps: Secondary | ICD-10-CM | POA: Diagnosis not present

## 2021-05-26 DIAGNOSIS — E039 Hypothyroidism, unspecified: Secondary | ICD-10-CM | POA: Diagnosis not present

## 2021-05-26 DIAGNOSIS — J449 Chronic obstructive pulmonary disease, unspecified: Secondary | ICD-10-CM | POA: Insufficient documentation

## 2021-05-26 DIAGNOSIS — E559 Vitamin D deficiency, unspecified: Secondary | ICD-10-CM | POA: Insufficient documentation

## 2021-05-26 DIAGNOSIS — R7989 Other specified abnormal findings of blood chemistry: Secondary | ICD-10-CM | POA: Insufficient documentation

## 2021-05-26 DIAGNOSIS — G4730 Sleep apnea, unspecified: Secondary | ICD-10-CM | POA: Diagnosis not present

## 2021-05-26 DIAGNOSIS — Z8589 Personal history of malignant neoplasm of other organs and systems: Secondary | ICD-10-CM | POA: Insufficient documentation

## 2021-05-26 DIAGNOSIS — C3432 Malignant neoplasm of lower lobe, left bronchus or lung: Secondary | ICD-10-CM

## 2021-05-26 DIAGNOSIS — Z853 Personal history of malignant neoplasm of breast: Secondary | ICD-10-CM | POA: Diagnosis not present

## 2021-05-26 DIAGNOSIS — Z87891 Personal history of nicotine dependence: Secondary | ICD-10-CM | POA: Diagnosis not present

## 2021-05-26 DIAGNOSIS — Z923 Personal history of irradiation: Secondary | ICD-10-CM | POA: Insufficient documentation

## 2021-05-26 DIAGNOSIS — I10 Essential (primary) hypertension: Secondary | ICD-10-CM | POA: Insufficient documentation

## 2021-05-26 DIAGNOSIS — Z801 Family history of malignant neoplasm of trachea, bronchus and lung: Secondary | ICD-10-CM | POA: Insufficient documentation

## 2021-05-26 LAB — COMPREHENSIVE METABOLIC PANEL
ALT: 28 U/L (ref 0–44)
AST: 23 U/L (ref 15–41)
Albumin: 4.1 g/dL (ref 3.5–5.0)
Alkaline Phosphatase: 180 U/L — ABNORMAL HIGH (ref 38–126)
Anion gap: 9 (ref 5–15)
BUN: 24 mg/dL — ABNORMAL HIGH (ref 8–23)
CO2: 27 mmol/L (ref 22–32)
Calcium: 9.2 mg/dL (ref 8.9–10.3)
Chloride: 105 mmol/L (ref 98–111)
Creatinine, Ser: 0.88 mg/dL (ref 0.44–1.00)
GFR, Estimated: >60 mL/min (ref >60)
Glucose, Bld: 97 mg/dL (ref 70–99)
Potassium: 4.5 mmol/L (ref 3.5–5.1)
Sodium: 141 mmol/L (ref 135–145)
Total Bilirubin: 0.2 mg/dL — ABNORMAL LOW (ref 0.3–1.2)
Total Protein: 7.2 g/dL (ref 6.5–8.1)

## 2021-05-26 LAB — CBC WITH DIFFERENTIAL/PLATELET
Abs Immature Granulocytes: 0.01 10*3/uL (ref 0.00–0.07)
Basophils Absolute: 0 10*3/uL (ref 0.0–0.1)
Basophils Relative: 1 %
Eosinophils Absolute: 0.1 10*3/uL (ref 0.0–0.5)
Eosinophils Relative: 2 %
HCT: 38.6 % (ref 36.0–46.0)
Hemoglobin: 12.8 g/dL (ref 12.0–15.0)
Immature Granulocytes: 0 %
Lymphocytes Relative: 28 %
Lymphs Abs: 1.7 10*3/uL (ref 0.7–4.0)
MCH: 30.8 pg (ref 26.0–34.0)
MCHC: 33.2 g/dL (ref 30.0–36.0)
MCV: 93 fL (ref 80.0–100.0)
Monocytes Absolute: 0.6 10*3/uL (ref 0.1–1.0)
Monocytes Relative: 10 %
Neutro Abs: 3.6 10*3/uL (ref 1.7–7.7)
Neutrophils Relative %: 59 %
Platelets: 240 10*3/uL (ref 150–400)
RBC: 4.15 MIL/uL (ref 3.87–5.11)
RDW: 13 % (ref 11.5–15.5)
WBC: 6.1 10*3/uL (ref 4.0–10.5)
nRBC: 0 % (ref 0.0–0.2)

## 2021-05-26 NOTE — Assessment & Plan Note (Addendum)
#  Stage I non-small cell lung cancer [atypical cells on cytology] -s/p SBRT.  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  #Elevated LFTs-alkaline phosphatase; but normal AST ALT/normal bilirubin-CT scan chest suggestive of hepatic steatosis.  Recommend follow-up blood work/in 3 months  # COPD.[Dr.Fleming]- on trelegy/albutrol- defer to pulmonary stable.  # DISPOSITION # Follow up in 12 months- MD; labs- cbc/cmp; CT chest prior- Dr.B  # I reviewed the blood work- with the patient in detail; also reviewed the imaging independently [as summarized above]; and with the patient in detail.   Addendum:   Recommend follow-up- NP/ blood work-/in 3 months.  Patient will be informed.

## 2021-05-26 NOTE — Progress Notes (Signed)
[place] CONSULT NOTE  Patient Care Team: Dionicia Abler, PA-C as PCP - General (Family Medicine) Bary Castilla, Forest Gleason, MD (General Surgery) Noreene Filbert, MD as Referring Physician (Radiation Oncology) Pa, [place] (Optometry)  CHIEF COMPLAINTS/PURPOSE OF CONSULTATION:  Lung mass.  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  Alone.  Michelle Pitts 70 y.o.  female history of COPD/prior history of smoking stage I non-small cell lung cancer status post SBRT is here for follow-up/review results of CT scan.  No new shortness of breath or cough.  No new chest pain or shortness of breath.  Chronic left-sided rib fractures.  Review of Systems  Constitutional:  Positive for malaise/fatigue. Negative for chills, diaphoresis, fever and weight loss.  HENT:  Negative for nosebleeds and sore throat.   Eyes:  Negative for double vision.  Respiratory:  Positive for cough and shortness of breath. Negative for hemoptysis, sputum production and wheezing.   Cardiovascular:  Negative for chest pain, palpitations, orthopnea and leg swelling.  Gastrointestinal:  Negative for abdominal pain, blood in stool, constipation, diarrhea, heartburn, melena, nausea and vomiting.  Genitourinary:  Negative for dysuria, frequency and urgency.  Musculoskeletal:  Positive for joint pain.  Negative for back pain.  Skin: Negative.  Negative for itching and rash.  Neurological:  Negative for dizziness, tingling, focal weakness, weakness and headaches.  Endo/Heme/Allergies:  Does not bruise/bleed easily.  Psychiatric/Behavioral:  Negative for depression. The patient is not nervous/anxious and does not have insomnia.     MEDICAL HISTORY:  Past Medical History:  Diagnosis Date  . Anemia   . Anxiety   . Breast cancer ([place]) 01/23/2014   Left breast, T1c, N0, M0. ER/PR +; Her 2 neu not overexpressed. Oncotype DX: 13,Low risk.9% over 10 years.   . Cancer ([place]) 1995   vulvar, [facility]  . COPD (chronic obstructive pulmonary disease) (HCC)    bronchitis  . Dyspnea    with exertion  . Dysrhythmia   . GERD (gastroesophageal reflux disease)   . History of colon polyps   . Hypercholesterolemia   . Hypertension   . Hypothyroidism   . Insomnia   . Lung cancer ([place])   . 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 Personal history of radiation therapy   . Personal history of tobacco use, presenting hazards to health 11/18/2015  . Sleep apnea    NO CPAP  . Sleep apnea   . SOB (shortness of breath) on exertion   . Spinal stenosis   . Thyroid disease   . Vitamin D deficiency     SURGICAL HISTORY: Past Surgical History:  Procedure Laterality Date  . BLADDER SUSPENSION  2001  . BREAST BIOPSY Right   . BREAST BIOPSY Left   . BREAST CYST ASPIRATION Left 1985  . BREAST EXCISIONAL BIOPSY Right 1975   x 2  . BREAST SURGERY Left 01/23/2014   T1c, N0; Er/ PR positive,  Her 2 negative   2 years anti-estrogen RX. Oncotype DX: 13,Low risk.9% over 10 years  . BREAST SURGERY Left 04/10/2014   Debridement of fat necrosis, left breast cancer wide excision site.   . CHOLECYSTECTOMY    . COLONOSCOPY  2014   Dr Tiffany Kocher  . COLONOSCOPY WITH PROPOFOL N/A 10/10/2016   Procedure: COLONOSCOPY WITH PROPOFOL;  Surgeon: Manya Silvas, MD;  Location[facility] ENDOSCOPY;  Service: Endoscopy;  Laterality: N/A;  .  ELECTROMAGNETIC NAVIGATION BROCHOSCOPY N/A 04/12/2018   Procedure: ELECTROMAGNETIC NAVIGATION BRONCHOSCOPY;  Surgeon: Flora Lipps, MD;  Location: [facility] ORS;  Service: Cardiopulmonary;  Laterality: N/A;  . LAPAROSCOPIC BILATERAL SALPINGO OOPHERECTOMY Bilateral 06/27/2018   Procedure: LAPAROSCOPIC BILATERAL SALPINGO OOPHORECTOMY;  Surgeon: Homero Fellers, MD;  Location: [facility] ORS;  Service: Gynecology;  Laterality: Bilateral;  . LAPAROSCOPIC GASTRIC SLEEVE RESECTION N/A 12/07/2020   Procedure: LAPAROSCOPIC GASTRIC SLEEVE RESECTION;  Surgeon: Johnathan Hausen, MD;  Location: [facility] ORS;  Service: General;  Laterality: N/A;  . ROTATOR CUFF REPAIR Right 1995  . UPPER GI ENDOSCOPY N/A 12/07/2020   Procedure: UPPER GI ENDOSCOPY;  Surgeon: Johnathan Hausen, MD;  Location: [facility] ORS;  Service: General;  Laterality: N/A;  . VAGINAL HYSTERECTOMY  1978   partial  . VULVECTOMY PARTIAL  1995  . wide excision debriedment Left 04/10/14    SOCIAL HISTORY: 50-pack-year history of smoking.  Quit 2016.  No alcohol abuse. Social History   Socioeconomic History  . Marital status: Divorced    Spouse name: Not on file  . Number of children: 2  . Years of education: Not on file  . Highest education level: Some college, no degree  Occupational History  . Occupation: retired  Tobacco Use  . Smoking status: Former    Packs/day: 1.00    Years: 50.00    Pack years: 50.00    Types: Cigarettes    Quit date: 09/15/2014    Years since quitting: 6.6  . Smokeless tobacco: Never  Vaping Use  . Vaping Use: Never used  Substance and Sexual Activity  . Alcohol use: Yes    Alcohol/week: 7.0 standard drinks    Types: 7 Cans of beer per week    Comment: usually beer, occasionally wine. Daily  . Drug use: No  . Sexual activity: Not Currently    Birth control/protection: Surgical  Other Topics Concern  . Not on file  Social History Narrative  . Not on file   Social Determinants of Health   Financial Resource Strain:  Not on file  Food Insecurity: Not on file  Transportation Needs: Not on file  Physical Activity: Not on file  Stress: Not on file  Social Connections: Not on file  Intimate Partner Violence: Not on file    FAMILY HISTORY: Family History  Problem Relation Age of Onset  . COPD Mother   . Diabetes Father   . Heart failure Sister   . Emphysema Sister   . Diabetes Sister   . Hypertension Sister   . Crohn's disease Sister   . Hypertension Sister   . Hypertension Sister   . Cancer Maternal Grandfather 46       ? source  . Cancer Paternal Grandfather 36       ? source  . Emphysema Brother   . Lung cancer Brother   . Emphysema Brother   . Breast cancer Other        great aunt  . Cancer Other        great great paternal  aunt, ? age    ALLERGIES:  is allergic to codeine, cyanoacrylate, gramineae pollens, hydrochlorothiazide, levaquin [levofloxacin in d5w], and tape.  MEDICATIONS:  Current Outpatient Medications  Medication Sig Dispense Refill  . albuterol (VENTOLIN HFA) 108 (90 Base) MCG/ACT inhaler Inhale 1-2 puffs into the lungs every 6 (six) hours as needed for wheezing or shortness of breath.    . ALPRAZolam (XANAX) 0.5 MG tablet Take 1 tablet (0.5 mg total) by mouth at bedtime as needed for anxiety. 30 tablet 5  . ezetimibe (ZETIA) 10 MG tablet Take 1 tablet (10 mg total) by mouth daily. 30 tablet 11  . fluticasone (FLONASE) 50 MCG/ACT nasal spray Place 2 sprays into both nostrils daily. 16 g 6  . Fluticasone-Umeclidin-Vilant (TRELEGY ELLIPTA) 100-62.5-25 MCG/INH AEPB Inhale 1 puff into the lungs daily.    Marland Kitchen gabapentin (NEURONTIN) 300 MG capsule Take 1 capsule (300 mg total) by mouth 2 (two) times daily as needed. For pain (Patient taking differently: Take 300 mg by mouth in the morning.) 180 capsule 1  . metoprolol succinate (TOPROL-XL) 25 MG 24 hr tablet Take 1 tablet (25 mg total) by mouth daily. 90 tablet 3  . Calcium Carbonate-Vitamin D (OYSTER SHELL CALCIUM/D PO) Take 1  tablet by mouth daily. Take 1 tablet by mouth in the morning and 1 tablet in the evening. Take with meals.    . Cholecalciferol (VITAMIN D3) 50 MCG (2000 UT) TABS Take 2,000 Units by mouth daily.    . Multiple Vitamins-Iron (ONE-DAILY MULTI-VITAMIN/IRON PO) Take by mouth. Take by mouth once daily    . ondansetron (ZOFRAN-ODT) 4 MG disintegrating tablet Take 1 tablet (4 mg total) by mouth every 6 (six) hours as needed for nausea or vomiting. (Patient not taking: Reported on 04/13/2021) 20 tablet 0  . pantoprazole (PROTONIX) 40 MG tablet Take 1 tablet (40 mg total) by mouth daily. (Patient not taking: Reported on 04/13/2021) 30 tablet 0   No current facility-administered medications for this visit.      Marland Kitchen  PHYSICAL EXAMINATION: ECOG PERFORMANCE STATUS: 0 - Asymptomatic  Vitals:   05/26/21 1457  BP: 131/76  Pulse: 75  Temp: 99.2 F (37.3 C)  SpO2: 97%   Filed Weights   05/26/21 1457  Weight: 226 lb 3.2 oz (102.6 kg)    Physical Exam HENT:     Head: Normocephalic and atraumatic.     Mouth/Throat:     Pharynx: No oropharyngeal exudate.  Eyes:     Pupils: Pupils are equal, round, and reactive to light.  Cardiovascular:     Rate and Rhythm: Normal rate and regular rhythm.  Pulmonary:     Effort: No respiratory distress.     Breath sounds: No wheezing.     Comments: Decreased breath sounds bilaterally. Abdominal:     General: Bowel sounds are normal. There is no distension.     Palpations: Abdomen is soft. There is no mass.     Tenderness: There is no abdominal tenderness. There is no guarding or rebound.  Musculoskeletal:        General: No tenderness. Normal range of motion.     Cervical back: Normal range of motion and neck supple.  Skin:    General: Skin is warm.  Neurological:     Mental Status: She is alert and oriented to person, place, and time.  Psychiatric:        Mood and Affect: Affect normal.     LABORATORY DATA:  I have reviewed the data as listed  Lab  Results  Component Value Date   WBC 6.1 05/26/2021   HGB 12.8 05/26/2021   HCT 38.6 05/26/2021   MCV 93.0 05/26/2021   PLT 240 05/26/2021   Recent Labs    10/08/20 1451 10/08/20 1451 12/03/20 1338 12/07/20 0630 12/22/20 1124 05/26/21 1429  NA 143   < > 141 141 145* 141  K 5.0  --  4.4 4.6 4.7 4.5  CL 106  --  107 108 102 105  CO2 23  --  27 25 22 27   GLUCOSE 96   < > 103* 113* 94 97  BUN 24   < > 27* 22 24 24*  CREATININE 1.25*  --  0.91 0.89 0.90 0.88  CALCIUM 9.7  --  9.7 9.4 10.4* 9.2  GFRNONAA 43*  --  >60 >60  --  >60  GFRAA 50*  --   --   --   --   --   PROT 6.7  --   --  7.5 6.8 7.2  ALBUMIN 4.5  --   --  4.2 4.6 4.1  AST 30  --   --  44* 42* 23  ALT 44*  --   --  50* 45* 28  ALKPHOS 164*  --   --  121 189* 180*  BILITOT 0.3  --   --  1.2 0.3 0.2*   < > = values in this interval not displayed.    RADIOGRAPHIC STUDIES: I have personally reviewed the radiological images as listed and agreed with the findings in the report. No results found.  ASSESSMENT & PLAN:   Primary cancer of left lower lobe of lung ([place]) #Stage I non-small cell lung cancer [atypical cells on cytology] -s/p SBRT.  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  #Elevated LFTs-alkaline phosphatase; but normal AST ALT/normal bilirubin-CT scan chest suggestive of hepatic steatosis.  Recommend follow-up blood work/in 3 months  # COPD.[Dr.Fleming]- on trelegy/albutrol- defer to pulmonary stable.  # DISPOSITION # Follow up in 12 months- MD; labs- cbc/cmp; CT chest prior- Dr.B  # I reviewed the blood work- with the patient in detail; also reviewed the imaging independently [as summarized above]; and with the patient in detail.   Addendum:   Recommend follow-up- NP/ blood work-/in 3 months.  Patient will be informed.   All questions were answered. The patient knows to call the clinic with any problems, questions or concerns.     Cammie Sickle, MD 05/26/2021 9:40 PM

## 2021-05-27 ENCOUNTER — Encounter: Payer: Medicare PPO | Admitting: *Deleted

## 2021-05-27 ENCOUNTER — Other Ambulatory Visit: Payer: Self-pay

## 2021-05-27 ENCOUNTER — Telehealth: Payer: Self-pay | Admitting: *Deleted

## 2021-05-27 DIAGNOSIS — J449 Chronic obstructive pulmonary disease, unspecified: Secondary | ICD-10-CM

## 2021-05-27 NOTE — Telephone Encounter (Signed)
-----   Message from Cammie Sickle, MD sent at 05/26/2021  9:43 PM EDT ----- Regarding: call pt re: labs Michelle Pitts please inform patient that her LFTs are slightly abnormal-the etiology is unclear.  Options include follow-up in the clinic in 3 months/NP; CBC CMP; acute hepatitis panel.  However patient does not want to follow-up here-please make sure she follows with the PCP.  And please document patient's-discussion.  GB

## 2021-05-27 NOTE — Telephone Encounter (Signed)
Katherine- pls call patient and see what she wants to do.

## 2021-05-27 NOTE — Progress Notes (Signed)
Daily Session Note  Patient Details  Name: Michelle Pitts MRN: [id number] Date of Birth: 04/11/1951 Referring Provider:   Flowsheet Row Pulmonary Rehab from 04/28/2021 in [facility] Cardiac and Pulmonary Rehab  Referring Provider Wallene Huh MD       Encounter Date: 05/27/2021  Check In:  Session Check In - 05/27/21 1329       Check-In   Supervising physician immediately available to respond to emergencies See telemetry face sheet for immediately available ER MD    Location [facility]-Cardiac & Pulmonary Rehab    Staff Present Renita Papa, RN BSN;Joseph [place], RCP,RRT,BSRT;Jessica [place], [place], RCEP, CCRP, CCET    Virtual Visit No    Medication changes reported     No    Fall or balance concerns reported    No    Warm-up and Cool-down Performed on first and last piece of equipment    Resistance Training Performed Yes    VAD Patient? No    PAD/SET Patient? No      Pain Assessment   Currently in Pain? No/denies                Social History   Tobacco Use  Smoking Status Former   Packs/day: 1.00   Years: 50.00   Pack years: 50.00   Types: Cigarettes   Quit date: 09/15/2014   Years since quitting: 6.7  Smokeless Tobacco Never    Goals Met:  Independence with exercise equipment Exercise tolerated well No report of concerns or symptoms today Strength training completed today  Goals Unmet:  Not Applicable  Comments: Pt able to follow exercise prescription today without complaint.  Will continue to monitor for progression.    Dr. Emily Filbert is Medical Director for [place].  Dr. Ottie Glazier is Medical Director for [facility] Pulmonary Rehabilitation.

## 2021-05-27 NOTE — Telephone Encounter (Signed)
Spoke with patient. Patient would like to follow-up with Pcp. Pcp is already following her elevated LFTs.

## 2021-05-31 ENCOUNTER — Other Ambulatory Visit: Payer: Self-pay

## 2021-05-31 DIAGNOSIS — J449 Chronic obstructive pulmonary disease, unspecified: Secondary | ICD-10-CM | POA: Diagnosis not present

## 2021-05-31 NOTE — Progress Notes (Signed)
Daily Session Note  Patient Details  Name: Michelle Pitts MRN: [id number] Date of Birth: 27-Aug-1950 Referring Provider:   Flowsheet Row Pulmonary Rehab from 04/28/2021 in [facility] Cardiac and Pulmonary Rehab  Referring Provider Wallene Huh MD       Encounter Date: 05/31/2021  Check In:  Session Check In - 05/31/21 1336       Check-In   Supervising physician immediately available to respond to emergencies See telemetry face sheet for immediately available ER MD    Location [facility]-Cardiac & Pulmonary Rehab    Staff Present Birdie Sons, MPA, RN;Joseph Lou [place], MS, ASCM CEP, Exercise Physiologist    Virtual Visit No    Medication changes reported     No    Fall or balance concerns reported    No    Warm-up and Cool-down Performed on first and last piece of equipment    Resistance Training Performed Yes    VAD Patient? No    PAD/SET Patient? No      Pain Assessment   Currently in Pain? No/denies                Social History   Tobacco Use  Smoking Status Former   Packs/day: 1.00   Years: 50.00   Pack years: 50.00   Types: Cigarettes   Quit date: 09/15/2014   Years since quitting: 6.7  Smokeless Tobacco Never    Goals Met:  Independence with exercise equipment Exercise tolerated well No report of concerns or symptoms today Strength training completed today  Goals Unmet:  Not Applicable  Comments: Pt able to follow exercise prescription today without complaint.  Will continue to monitor for progression.    Dr. Emily Filbert is Medical Director for [place].  Dr. Ottie Glazier is Medical Director for [facility] Pulmonary Rehabilitation.

## 2021-06-02 ENCOUNTER — Other Ambulatory Visit: Payer: Self-pay

## 2021-06-02 DIAGNOSIS — J449 Chronic obstructive pulmonary disease, unspecified: Secondary | ICD-10-CM | POA: Diagnosis not present

## 2021-06-02 NOTE — Progress Notes (Signed)
Daily Session Note  Patient Details  Name: Michelle Pitts MRN: [id number] Date of Birth: 05-20-1951 Referring Provider:   Flowsheet Row Pulmonary Rehab from 04/28/2021 in [facility] Cardiac and Pulmonary Rehab  Referring Provider Wallene Huh MD       Encounter Date: 06/02/2021  Check In:  Session Check In - 06/02/21 1332       Check-In   Supervising physician immediately available to respond to emergencies See telemetry face sheet for immediately available ER MD    Location [facility]-Cardiac & Pulmonary Rehab    Staff Present Birdie Sons, MPA, RN;Jessica [place], MA, RCEP, CCRP, CCET;Joseph [place], [place]    Virtual Visit No    Medication changes reported     No    Fall or balance concerns reported    No    Warm-up and Cool-down Performed on first and last piece of equipment    Resistance Training Performed Yes    VAD Patient? No    PAD/SET Patient? No      Pain Assessment   Currently in Pain? No/denies                Social History   Tobacco Use  Smoking Status Former   Packs/day: 1.00   Years: 50.00   Pack years: 50.00   Types: Cigarettes   Quit date: 09/15/2014   Years since quitting: 6.7  Smokeless Tobacco Never    Goals Met:  Independence with exercise equipment Exercise tolerated well No report of concerns or symptoms today Strength training completed today  Goals Unmet:  Not Applicable  Comments: Pt able to follow exercise prescription today without complaint.  Will continue to monitor for progression.    Dr. Emily Filbert is Medical Director for [place].  Dr. Ottie Glazier is Medical Director for [facility] Pulmonary Rehabilitation.

## 2021-06-03 ENCOUNTER — Other Ambulatory Visit: Payer: Self-pay

## 2021-06-03 ENCOUNTER — Encounter: Payer: Medicare PPO | Admitting: *Deleted

## 2021-06-03 DIAGNOSIS — J449 Chronic obstructive pulmonary disease, unspecified: Secondary | ICD-10-CM

## 2021-06-03 NOTE — Progress Notes (Signed)
Daily Session Note  Patient Details  Name: Michelle Pitts MRN: [id number] Date of Birth: 1951/02/24 Referring Provider:   Flowsheet Row Pulmonary Rehab from 04/28/2021 in [facility] Cardiac and Pulmonary Rehab  Referring Provider Wallene Huh MD       Encounter Date: 06/03/2021  Check In:  Session Check In - 06/03/21 1333       Check-In   Supervising physician immediately available to respond to emergencies See telemetry face sheet for immediately available ER MD    Location [facility]-Cardiac & Pulmonary Rehab    Staff Present Renita Papa, RN BSN;Joseph [place], RCP,RRT,BSRT;Jessica [place], [place], RCEP, CCRP, CCET    Virtual Visit No    Medication changes reported     No    Fall or balance concerns reported    No    Warm-up and Cool-down Performed on first and last piece of equipment    Resistance Training Performed Yes    VAD Patient? No    PAD/SET Patient? No      Pain Assessment   Currently in Pain? No/denies                Social History   Tobacco Use  Smoking Status Former   Packs/day: 1.00   Years: 50.00   Pack years: 50.00   Types: Cigarettes   Quit date: 09/15/2014   Years since quitting: 6.7  Smokeless Tobacco Never    Goals Met:  Independence with exercise equipment Exercise tolerated well No report of concerns or symptoms today Strength training completed today  Goals Unmet:  Not Applicable  Comments: Pt able to follow exercise prescription today without complaint.  Will continue to monitor for progression.    Dr. Emily Filbert is Medical Director for [place].  Dr. Ottie Glazier is Medical Director for [facility] Pulmonary Rehabilitation.

## 2021-06-07 ENCOUNTER — Other Ambulatory Visit: Payer: Self-pay

## 2021-06-07 DIAGNOSIS — J449 Chronic obstructive pulmonary disease, unspecified: Secondary | ICD-10-CM | POA: Diagnosis not present

## 2021-06-07 NOTE — Progress Notes (Signed)
Daily Session Note  Patient Details  Name: Michelle Pitts MRN: [id number] Date of Birth: 1950/09/10 Referring Provider:   Flowsheet Row Pulmonary Rehab from 04/28/2021 in [facility] Cardiac and Pulmonary Rehab  Referring Provider Wallene Huh MD       Encounter Date: 06/07/2021  Check In:  Session Check In - 06/07/21 1333       Check-In   Supervising physician immediately available to respond to emergencies See telemetry face sheet for immediately available ER MD    Location [facility]-Cardiac & Pulmonary Rehab    Staff Present Birdie Sons, MPA, Nino Glow, MS, ASCM CEP, Exercise Physiologist;Joseph Tessie [place], [place]    Virtual Visit No    Medication changes reported     No    Fall or balance concerns reported    No    Warm-up and Cool-down Performed on first and last piece of equipment    Resistance Training Performed Yes    VAD Patient? No    PAD/SET Patient? No      Pain Assessment   Currently in Pain? No/denies                Social History   Tobacco Use  Smoking Status Former   Packs/day: 1.00   Years: 50.00   Pack years: 50.00   Types: Cigarettes   Quit date: 09/15/2014   Years since quitting: 6.7  Smokeless Tobacco Never    Goals Met:  Independence with exercise equipment Exercise tolerated well No report of concerns or symptoms today Strength training completed today  Goals Unmet:  Not Applicable  Comments: Pt able to follow exercise prescription today without complaint.  Will continue to monitor for progression.    Dr. Emily Filbert is Medical Director for [place].  Dr. Ottie Glazier is Medical Director for [facility] Pulmonary Rehabilitation.

## 2021-06-10 ENCOUNTER — Encounter: Payer: Medicare PPO | Admitting: *Deleted

## 2021-06-10 ENCOUNTER — Other Ambulatory Visit: Payer: Self-pay

## 2021-06-10 DIAGNOSIS — J449 Chronic obstructive pulmonary disease, unspecified: Secondary | ICD-10-CM | POA: Diagnosis not present

## 2021-06-10 NOTE — Progress Notes (Signed)
Daily Session Note  Patient Details  Name: Michelle Pitts MRN: [id number] Date of Birth: Aug 19, 1950 Referring Provider:   Flowsheet Row Pulmonary Rehab from 04/28/2021 in [facility] Cardiac and Pulmonary Rehab  Referring Provider Wallene Huh MD       Encounter Date: 06/10/2021  Check In:  Session Check In - 06/10/21 1331       Check-In   Supervising physician immediately available to respond to emergencies See telemetry face sheet for immediately available ER MD    Location [facility]-Cardiac & Pulmonary Rehab    Staff Present Renita Papa, RN BSN;Joseph [place], RCP,RRT,BSRT;Jessica [place], [place], RCEP, CCRP, CCET    Virtual Visit No    Medication changes reported     No    Fall or balance concerns reported    No    Warm-up and Cool-down Performed on first and last piece of equipment    Resistance Training Performed Yes    VAD Patient? No    PAD/SET Patient? No      Pain Assessment   Currently in Pain? No/denies                Social History   Tobacco Use  Smoking Status Former   Packs/day: 1.00   Years: 50.00   Pack years: 50.00   Types: Cigarettes   Quit date: 09/15/2014   Years since quitting: 6.7  Smokeless Tobacco Never    Goals Met:  Independence with exercise equipment Exercise tolerated well No report of concerns or symptoms today Strength training completed today  Goals Unmet:  Not Applicable  Comments: Pt able to follow exercise prescription today without complaint.  Will continue to monitor for progression.    Dr. Emily Filbert is Medical Director for [place].  Dr. Ottie Glazier is Medical Director for [facility] Pulmonary Rehabilitation.

## 2021-06-11 ENCOUNTER — Encounter: Payer: Self-pay | Admitting: *Deleted

## 2021-06-11 DIAGNOSIS — J449 Chronic obstructive pulmonary disease, unspecified: Secondary | ICD-10-CM

## 2021-06-11 NOTE — Progress Notes (Signed)
Discharge Progress Report  Patient Details  Name: Michelle Pitts MRN: [id number] Date of Birth: 09/16/1950 Referring Provider:   Flowsheet Row Pulmonary Rehab from 04/28/2021 in [facility]  Referring Provider Wallene Huh MD        Number of Visits: 17  Reason for Discharge:  Early Exit:  Insurance  Smoking History:  Social History   Tobacco Use  Smoking Status Former   Packs/day: 1.00   Years: 50.00   Pack years: 50.00   Types: Cigarettes   Quit date: 09/15/2014   Years since quitting: 6.7  Smokeless Tobacco Never    Diagnosis:  Chronic obstructive pulmonary disease, unspecified COPD type ([place])  ADL [facility]:  Pulmonary Assessment Scores     Row Name 04/28/21 1243         ADL [facility]SD   ADL Phase Entry     SOB Score total 18     Rest 0     Walk 0     Stairs 2     Bath 0     Dress 0     Shop 1       CAT Score   CAT Score 10       mMRC Score   mMRC Score 2              Initial Exercise Prescription:  Initial Exercise Prescription - 04/28/21 1200       Date of Initial Exercise RX and Referring Provider   Date 04/28/21    Referring Provider Wallene Huh MD      Treadmill   MPH 1.8    Grade 0    Minutes 15    METs 2.38      Recumbant Bike   Level 1    RPM 60    Watts 15    Minutes 15    METs 1.98      NuStep   Level 1    SPM 80    Minutes 15    METs 1.98      Biostep-RELP   Level 1    SPM 50    Minutes 15    METs 1.98      Track   Laps 19    Minutes 15    METs 1.35      Prescription Details   Frequency (times per week) 3    Duration Progress to 30 minutes of continuous aerobic without signs/symptoms of physical distress      Intensity   THRR 40-80% of Max Heartrate 102-133    Ratings of Perceived Exertion 11-13    Perceived Dyspnea 0-4      Progression   Progression Continue to progress workloads to maintain intensity without signs/symptoms of physical distress.      Resistance Training    Training Prescription Yes    Weight 2 lb   5 lb weight restriction   Reps 10-15             Discharge Exercise Prescription (Final Exercise Prescription Changes):  Exercise Prescription Changes - 05/31/21 1200       Response to Exercise   Blood Pressure (Admit) 136/70    Blood Pressure (Exit) 132/72    Heart Rate (Admit) 86 bpm    Heart Rate (Exercise) 108 bpm    Heart Rate (Exit) 91 bpm    Oxygen Saturation (Admit) 95 %    Oxygen Saturation (Exercise) 95 %    Oxygen Saturation (Exit) 95 %  Rating of Perceived Exertion (Exercise) 15    Perceived Dyspnea (Exercise) 3    Symptoms SOB    Duration Continue with 30 min of aerobic exercise without signs/symptoms of physical distress.    Intensity THRR unchanged      Progression   Progression Continue to progress workloads to maintain intensity without signs/symptoms of physical distress.    Average METs 2.24      Resistance Training   Training Prescription Yes    Weight 2 lb    Reps 10-15      Interval Training   Interval Training No      T5 Nustep   Level 1    Minutes 15    METs 2.1      Biostep-RELP   Level 2    Minutes 15    METs 2      Track   Laps 30    Minutes 15    METs 2.63      Home Exercise Plan   Plans to continue exercise at Home (comment)   walking, Youtube videos   Frequency Add 2 additional days to program exercise sessions.   start with 1   Initial Home Exercises Provided 05/12/21      Oxygen   Maintain Oxygen Saturation 88% or higher             Functional Capacity:  6 Minute Walk     Row Name 04/28/21 1240         6 Minute Walk   Phase Initial     Distance 1135 feet     Walk Time 6 minutes     # of Rest Breaks 0     MPH 2.14     METS 1.98     RPE 13     Perceived Dyspnea  3     VO2 Peak 6.93     Symptoms Yes (comment)     Comments Low back pain 5/10, bilateral hip pain 2/10     Resting HR 71 bpm     Resting BP 108/68     Resting Oxygen Saturation  98 %      Exercise Oxygen Saturation  during 6 min walk 96 %     Max Ex. HR 98 bpm     Max Ex. BP 142/70     2 Minute Post BP 112/72       Interval HR   1 Minute HR 87     2 Minute HR 95     3 Minute HR 96     4 Minute HR 97     5 Minute HR 98     6 Minute HR 99     2 Minute Post HR 77     Interval Heart Rate? Yes       Interval Oxygen   Interval Oxygen? Yes     Baseline Oxygen Saturation % 98 %     1 Minute Oxygen Saturation % 98 %     1 Minute Liters of Oxygen 0 L  RA     2 Minute Oxygen Saturation % 96 %     2 Minute Liters of Oxygen 0 L     3 Minute Oxygen Saturation % 98 %     3 Minute Liters of Oxygen 0 L     4 Minute Oxygen Saturation % 97 %     4 Minute Liters of Oxygen 0 L     5 Minute Oxygen Saturation % 96 %  5 Minute Liters of Oxygen 0 L     6 Minute Oxygen Saturation % 98 %     6 Minute Liters of Oxygen 0 L     2 Minute Post Oxygen Saturation % 98 %     2 Minute Post Liters of Oxygen 0 L              Psychological, QOL, Others - Outcomes: PHQ 2/9: Depression screen [facility] 2/9 04/28/2021 10/08/2020 06/01/2020 03/02/2020 02/19/2019  Decreased Interest 0 0 0 0 0  Down, Depressed, Hopeless 0 0 0 0 0  PHQ - 2 Score 0 0 0 0 0  Altered sleeping 0 1 - - -  Tired, decreased energy 1 1 - - -  Change in appetite 0 0 - - -  Feeling bad or failure about yourself  0 0 - - -  Trouble concentrating 0 0 - - -  Moving slowly or fidgety/restless 0 0 - - -  Suicidal thoughts 0 0 - - -  PHQ-9 Score 1 2 - - -  Difficult doing work/chores Not difficult at all Not difficult at all - - -  Some recent data might be hidden        Nutrition & Weight - Outcomes:  Pre Biometrics - 04/28/21 1230       Pre Biometrics   Height 5' 4.8" (1.646 m)    Weight 229 lb 9.6 oz (104.1 kg)    BMI (Calculated) 38.44    Single Leg Stand 6.91 seconds               Goals reviewed with patient; copy given to patient.

## 2021-06-11 NOTE — Progress Notes (Signed)
Pulmonary Individual Treatment Plan  Patient Details  Name: YEIRA GULDEN MRN: [id number] Date of Birth: October 19, 1950 Referring Provider:   Flowsheet Row Pulmonary Rehab from 04/28/2021 in [facility] Cardiac and Pulmonary Rehab  Referring Provider Wallene Huh MD       Initial Encounter Date:  Flowsheet Row Pulmonary Rehab from 04/28/2021 in [facility] Cardiac and Pulmonary Rehab  Date 04/28/21       Visit Diagnosis: Chronic obstructive pulmonary disease, unspecified COPD type ([place])  Patient's Home Medications on Admission:  Current Outpatient Medications:    albuterol (VENTOLIN HFA) 108 (90 Base) MCG/ACT inhaler, Inhale 1-2 puffs into the lungs every 6 (six) hours as needed for wheezing or shortness of breath., Disp: , Rfl:    ALPRAZolam (XANAX) 0.5 MG tablet, Take 1 tablet (0.5 mg total) by mouth at bedtime as needed for anxiety., Disp: 30 tablet, Rfl: 5   Calcium Carbonate-Vitamin D (OYSTER SHELL CALCIUM/D PO), Take 1 tablet by mouth daily. Take 1 tablet by mouth in the morning and 1 tablet in the evening. Take with meals., Disp: , Rfl:    Cholecalciferol (VITAMIN D3) 50 MCG (2000 UT) TABS, Take 2,000 Units by mouth daily., Disp: , Rfl:    ezetimibe (ZETIA) 10 MG tablet, Take 1 tablet (10 mg total) by mouth daily., Disp: 30 tablet, Rfl: 11   fluticasone (FLONASE) 50 MCG/ACT nasal spray, Place 2 sprays into both nostrils daily., Disp: 16 g, Rfl: 6   Fluticasone-Umeclidin-Vilant (TRELEGY ELLIPTA) 100-62.5-25 MCG/INH AEPB, Inhale 1 puff into the lungs daily., Disp: , Rfl:    gabapentin (NEURONTIN) 300 MG capsule, Take 1 capsule (300 mg total) by mouth 2 (two) times daily as needed. For pain (Patient taking differently: Take 300 mg by mouth in the morning.), Disp: 180 capsule, Rfl: 1   metoprolol succinate (TOPROL-XL) 25 MG 24 hr tablet, Take 1 tablet (25 mg total) by mouth daily., Disp: 90 tablet, Rfl: 3   Multiple Vitamins-Iron (ONE-DAILY MULTI-VITAMIN/IRON PO), Take by mouth. Take by mouth  once daily, Disp: , Rfl:    ondansetron (ZOFRAN-ODT) 4 MG disintegrating tablet, Take 1 tablet (4 mg total) by mouth every 6 (six) hours as needed for nausea or vomiting. (Patient not taking: Reported on 04/13/2021), Disp: 20 tablet, Rfl: 0   pantoprazole (PROTONIX) 40 MG tablet, Take 1 tablet (40 mg total) by mouth daily. (Patient not taking: Reported on 04/13/2021), Disp: 30 tablet, Rfl: 0  Past Medical History: Past Medical History:  Diagnosis Date   Anemia    Anxiety    Breast cancer ([place]) 01/23/2014   Left breast, T1c, N0, M0. ER/PR +; Her 2 neu not overexpressed. Oncotype DX: 13,Low risk.9% over 10 years.    Cancer ([place]) 1995   vulvar, [facility]   COPD (chronic obstructive pulmonary disease) (H[facility])    bronchitis   Dyspnea    with exertion   Dysrhythmia    GERD (gastroesophageal reflux disease)    History of colon polyps    Hypercholesterolemia    Hypertension    Hypothyroidism    Insomnia    Lung cancer ([place])    Papilloma of breast 01/09/2014   Personal history of radiation therapy    Personal history of tobacco use, presenting hazards to health 11/18/2015   Sleep apnea    NO CPAP   Sleep apnea    SOB (shortness of breath) on exertion    Spinal stenosis    Thyroid disease    Vitamin D deficiency     Tobacco Use: Social  History   Tobacco Use  Smoking Status Former   Packs/day: 1.00   Years: 50.00   Pack years: 50.00   Types: Cigarettes   Quit date: 09/15/2014   Years since quitting: 6.7  Smokeless Tobacco Never    Labs: Recent Review Flowsheet Data     Labs for ITP Cardiac and Pulmonary Rehab Latest Ref Rng & Units 02/19/2019 03/02/2020 05/21/2020 10/08/2020 12/22/2020   Cholestrol 100 - 199 mg/dL 224(H) 215(H) 205(H) 214(H) 162   LDLCALC 0 - 99 mg/dL 124(H) 117(H) 115(H) 110(H) 88   HDL >39 mg/dL 76 80 71 73 47   Trlycerides 0 - 149 mg/dL 119 101 96 180(H) 155(H)   Hemoglobin A1c 4.8 - 5.6 % - 5.5 5.6 5.6 5.6   PHART 7.350 - 7.450 - - - - -   PCO2ART 32.0 - 48.0 mmHg  - - - - -   HCO3 20.0 - 28.0 mmol/L - - - - -   O2SAT % - - - - -        Pulmonary Assessment Scores:  Pulmonary Assessment Scores     Row Name 04/28/21 1243         ADL UCSD   ADL Phase Entry     SOB Score total 18     Rest 0     Walk 0     Stairs 2     Bath 0     Dress 0     Shop 1       CAT Score   CAT Score 10       mMRC Score   mMRC Score 2              UCSD: Self-administered rating of dyspnea associated with activities of daily living (ADLs) 6-point scale (0 = "not at all" to 5 = "maximal or unable to do because of breathlessness")  Scoring Scores range from 0 to 120.  Minimally important difference is 5 units  CAT: CAT can identify the health impairment of COPD patients and is better correlated with disease progression.  CAT has a scoring range of zero to 40. The CAT score is classified into four groups of low (less than 10), medium (10 - 20), high (21-30) and very high (31-40) based on the impact level of disease on health status. A CAT score over 10 suggests significant symptoms.  A worsening CAT score could be explained by an exacerbation, poor medication adherence, poor inhaler technique, or progression of COPD or comorbid conditions.  CAT MCID is 2 points  mMRC: mMRC (Modified Medical Research Council) Dyspnea Scale is used to assess the degree of baseline functional disability in patients of respiratory disease due to dyspnea. No minimal important difference is established. A decrease in score of 1 point or greater is considered a positive change.   Pulmonary Function Assessment:   Exercise Target Goals: Exercise Program Goal: Individual exercise prescription set using results from initial 6 min walk test and THRR while considering  patient's activity barriers and safety.   Exercise Prescription Goal: Initial exercise prescription builds to 30-45 minutes a day of aerobic activity, 2-3 days per week.  Home exercise guidelines will be given to  patient during program as part of exercise prescription that the participant will acknowledge.  Education: Aerobic Exercise: - Group verbal and visual presentation on the components of exercise prescription. Introduces F.I.T.T principle from ACSM for exercise prescriptions.  Reviews F.I.T.T. principles of aerobic exercise including progression. Written material given at  graduation.   Education: Resistance Exercise: - Group verbal and visual presentation on the components of exercise prescription. Introduces F.I.T.T principle from ACSM for exercise prescriptions  Reviews F.I.T.T. principles of resistance exercise including progression. Written material given at graduation.    Education: Exercise & Equipment Safety: - Individual verbal instruction and demonstration of equipment use and safety with use of the equipment.   Education: Exercise Physiology & General Exercise Guidelines: - Group verbal and written instruction with models to review the exercise physiology of the cardiovascular system and associated critical values. Provides general exercise guidelines with specific guidelines to those with heart or lung disease.  Flowsheet Row Pulmonary Rehab from 06/02/2021 in [facility] Cardiac and Pulmonary Rehab  Date 06/02/21  Educator AS  Instruction Review Code 1- Verbalizes Understanding       Education: Flexibility, Balance, Mind/Body Relaxation: - Group verbal and visual presentation with interactive activity on the components of exercise prescription. Introduces F.I.T.T principle from ACSM for exercise prescriptions. Reviews F.I.T.T. principles of flexibility and balance exercise training including progression. Also discusses the mind body connection.  Reviews various relaxation techniques to help reduce and manage stress (i.e. Deep breathing, progressive muscle relaxation, and visualization). Balance handout provided to take home. Written material given at graduation.   Activity Barriers &  Risk Stratification:  Activity Barriers & Cardiac Risk Stratification - 04/28/21 1231       Activity Barriers & Cardiac Risk Stratification   Activity Barriers Neck/Spine Problems;Shortness of Breath;Deconditioning;Muscular Weakness;Back Problems;Other (comment)    Comments Fractured ribs, weight restriction of 5 lbs             6 Minute Walk:  6 Minute Walk     Row Name 04/28/21 1240         6 Minute Walk   Phase Initial     Distance 1135 feet     Walk Time 6 minutes     # of Rest Breaks 0     MPH 2.14     METS 1.98     RPE 13     Perceived Dyspnea  3     VO2 Peak 6.93     Symptoms Yes (comment)     Comments Low back pain 5/10, bilateral hip pain 2/10     Resting HR 71 bpm     Resting BP 108/68     Resting Oxygen Saturation  98 %     Exercise Oxygen Saturation  during 6 min walk 96 %     Max Ex. HR 98 bpm     Max Ex. BP 142/70     2 Minute Post BP 112/72       Interval HR   1 Minute HR 87     2 Minute HR 95     3 Minute HR 96     4 Minute HR 97     5 Minute HR 98     6 Minute HR 99     2 Minute Post HR 77     Interval Heart Rate? Yes       Interval Oxygen   Interval Oxygen? Yes     Baseline Oxygen Saturation % 98 %     1 Minute Oxygen Saturation % 98 %     1 Minute Liters of Oxygen 0 L  RA     2 Minute Oxygen Saturation % 96 %     2 Minute Liters of Oxygen 0 L     3 Minute Oxygen Saturation %  98 %     3 Minute Liters of Oxygen 0 L     4 Minute Oxygen Saturation % 97 %     4 Minute Liters of Oxygen 0 L     5 Minute Oxygen Saturation % 96 %     5 Minute Liters of Oxygen 0 L     6 Minute Oxygen Saturation % 98 %     6 Minute Liters of Oxygen 0 L     2 Minute Post Oxygen Saturation % 98 %     2 Minute Post Liters of Oxygen 0 L             Oxygen Initial Assessment:  Oxygen Initial Assessment - 04/28/21 1243       Home Oxygen   Home Oxygen Device None    Sleep Oxygen Prescription None    Home Exercise Oxygen Prescription None    Home  Resting Oxygen Prescription None      Initial 6 min Walk   Oxygen Used None      Program Oxygen Prescription   Program Oxygen Prescription None      Intervention   Short Term Goals To learn and demonstrate proper use of respiratory medications;To learn and demonstrate proper pursed lip breathing techniques or other breathing techniques.     Long  Term Goals Exhibits proper breathing techniques, such as pursed lip breathing or other method taught during program session;Demonstrates proper use of MDI's;Compliance with respiratory medication             Oxygen Re-Evaluation:  Oxygen Re-Evaluation     [place] Name 04/29/21 1341 05/31/21 1407           Program Oxygen Prescription   Program Oxygen Prescription None None        Home Oxygen   Home Oxygen Device None None      Sleep Oxygen Prescription None None      Home Exercise Oxygen Prescription None None      Home Resting Oxygen Prescription None None        Goals/Expected Outcomes   Short Term Goals To learn and demonstrate proper use of respiratory medications;To learn and demonstrate proper pursed lip breathing techniques or other breathing techniques.  To learn and demonstrate proper use of respiratory medications;To learn and demonstrate proper pursed lip breathing techniques or other breathing techniques. ;To learn and understand importance of maintaining oxygen saturations>88%;To learn and understand importance of monitoring SPO2 with pulse oximeter and demonstrate accurate use of the pulse oximeter.      Long  Term Goals Exhibits proper breathing techniques, such as pursed lip breathing or other method taught during program session;Demonstrates proper use of MDI's;Compliance with respiratory medication Exhibits proper breathing techniques, such as pursed lip breathing or other method taught during program session;Demonstrates proper use of MDI's;Compliance with respiratory medication;Maintenance of O2 saturations>88%;Verbalizes  importance of monitoring SPO2 with pulse oximeter and return demonstration      Comments Reviewed PLB technique with pt.  Talked about how it works and it's importance in maintaining their exercise saturations. She continues to work on her PLB and she is trying to drive it into her system.  She wants to make it more natural. She does focus on it while she is walking.      Goals/Expected Outcomes Short: Become more profiecient at using PLB.   Long: Become independent at using PLB. Short: Continue to improve PLB use Long: Continue to improve breathing.  Oxygen Discharge (Final Oxygen Re-Evaluation):  Oxygen Re-Evaluation - 05/31/21 1407       Program Oxygen Prescription   Program Oxygen Prescription None      Home Oxygen   Home Oxygen Device None    Sleep Oxygen Prescription None    Home Exercise Oxygen Prescription None    Home Resting Oxygen Prescription None      Goals/Expected Outcomes   Short Term Goals To learn and demonstrate proper use of respiratory medications;To learn and demonstrate proper pursed lip breathing techniques or other breathing techniques. ;To learn and understand importance of maintaining oxygen saturations>88%;To learn and understand importance of monitoring SPO2 with pulse oximeter and demonstrate accurate use of the pulse oximeter.    Long  Term Goals Exhibits proper breathing techniques, such as pursed lip breathing or other method taught during program session;Demonstrates proper use of MDI's;Compliance with respiratory medication;Maintenance of O2 saturations>88%;Verbalizes importance of monitoring SPO2 with pulse oximeter and return demonstration    Comments She continues to work on her PLB and she is trying to drive it into her system.  She wants to make it more natural. She does focus on it while she is walking.    Goals/Expected Outcomes Short: Continue to improve PLB use Long: Continue to improve breathing.             Initial Exercise  Prescription:  Initial Exercise Prescription - 04/28/21 1200       Date of Initial Exercise RX and Referring Provider   Date 04/28/21    Referring Provider Wallene Huh MD      Treadmill   MPH 1.8    Grade 0    Minutes 15    METs 2.38      Recumbant Bike   Level 1    RPM 60    Watts 15    Minutes 15    METs 1.98      NuStep   Level 1    SPM 80    Minutes 15    METs 1.98      Biostep-RELP   Level 1    SPM 50    Minutes 15    METs 1.98      Track   Laps 19    Minutes 15    METs 1.35      Prescription Details   Frequency (times per week) 3    Duration Progress to 30 minutes of continuous aerobic without signs/symptoms of physical distress      Intensity   THRR 40-80% of Max Heartrate 102-133    Ratings of Perceived Exertion 11-13    Perceived Dyspnea 0-4      Progression   Progression Continue to progress workloads to maintain intensity without signs/symptoms of physical distress.      Resistance Training   Training Prescription Yes    Weight 2 lb   5 lb weight restriction   Reps 10-15             Perform Capillary Blood Glucose checks as needed.  Exercise Prescription Changes:   Exercise Prescription Changes     Row Name 04/28/21 1200 05/03/21 1000 05/12/21 1500 05/18/21 0700 05/31/21 1200     Response to Exercise   Blood Pressure (Admit) 108/68 146/70 -- 104/64 136/70   Blood Pressure (Exercise) 142/70 142/60 -- 148/70 --   Blood Pressure (Exit) 112/72 126/70 -- 114/66 132/72   Heart Rate (Admit) 71 bpm 74 bpm -- 71 bpm 86 bpm   Heart Rate (  Exercise) 98 bpm 87 bpm -- 98 bpm 108 bpm   Heart Rate (Exit) 74 bpm 88 bpm -- 81 bpm 91 bpm   Oxygen Saturation (Admit) 98 % 96 % -- 98 % 95 %   Oxygen Saturation (Exercise) 96 % 94 % -- 93 % 95 %   Oxygen Saturation (Exit) 98 % 96 % -- 97 % 95 %   Rating of Perceived Exertion (Exercise) 13 15 -- 13 15   Perceived Dyspnea (Exercise) 3 3 -- 2 3   Symptoms low back pain 5/10, bilateral hip pain 3/10  SOB -- SOB SOB   Comments walk test results 1st full day of exercise -- -- --   Duration -- Progress to 30 minutes of  aerobic without signs/symptoms of physical distress -- Progress to 30 minutes of  aerobic without signs/symptoms of physical distress Continue with 30 min of aerobic exercise without signs/symptoms of physical distress.   Intensity -- THRR unchanged -- THRR unchanged THRR unchanged     Progression   Progression -- Continue to progress workloads to maintain intensity without signs/symptoms of physical distress. -- Continue to progress workloads to maintain intensity without signs/symptoms of physical distress. Continue to progress workloads to maintain intensity without signs/symptoms of physical distress.   Average METs -- 2.7 -- 2 2.24     Resistance Training   Training Prescription -- Yes -- Yes Yes   Weight -- 2 lb -- 2 lb 2 lb   Reps -- 10-15 -- 10-15 10-15     Interval Training   Interval Training -- No -- No No     Recumbant Bike   Level -- 1 -- -- --   Minutes -- 15 -- -- --   METs -- 2.75 -- -- --     NuStep   Level -- 2 -- -- --   Minutes -- 15 -- -- --   METs -- 2.8 -- -- --     T5 Nustep   Level -- -- -- -- 1   Minutes -- -- -- -- 15   METs -- -- -- -- 2.1     Biostep-RELP   Level -- -- -- 2 2   Minutes -- -- -- 15 15   METs -- -- -- 2 2     Track   Laps -- -- -- 17 30   Minutes -- -- -- 15 15   METs -- -- -- 1.92 2.63     Home Exercise Plan   Plans to continue exercise at -- -- Home (comment)  walking, Youtube videos Home (comment)  walking, Youtube videos Home (comment)  walking, Youtube videos   Frequency -- -- Add 2 additional days to program exercise sessions.  start with 1 Add 2 additional days to program exercise sessions.  start with 1 Add 2 additional days to program exercise sessions.  start with 1   Initial Home Exercises Provided -- -- 05/12/21 05/12/21 05/12/21     Oxygen   Maintain Oxygen Saturation -- -- -- -- 88% or higher             Exercise Comments:   Exercise Goals and Review:   Exercise Goals     Row Name 04/28/21 1303             Exercise Goals   Increase Physical Activity Yes       Intervention Provide advice, education, support and counseling about physical activity/exercise needs.;Develop an individualized exercise prescription for aerobic and  resistive training based on initial evaluation findings, risk stratification, comorbidities and participant's personal goals.       Expected Outcomes Short Term: Attend rehab on a regular basis to increase amount of physical activity.;Long Term: Add in home exercise to make exercise part of routine and to increase amount of physical activity.;Long Term: Exercising regularly at least 3-5 days a week.       Increase Strength and Stamina Yes       Intervention Provide advice, education, support and counseling about physical activity/exercise needs.;Develop an individualized exercise prescription for aerobic and resistive training based on initial evaluation findings, risk stratification, comorbidities and participant's personal goals.       Expected Outcomes Short Term: Increase workloads from initial exercise prescription for resistance, speed, and METs.;Long Term: Improve cardiorespiratory fitness, muscular endurance and strength as measured by increased METs and functional capacity (6MWT);Short Term: Perform resistance training exercises routinely during rehab and add in resistance training at home       Able to understand and use rate of perceived exertion (RPE) scale Yes       Intervention Provide education and explanation on how to use RPE scale       Expected Outcomes Short Term: Able to use RPE daily in rehab to express subjective intensity level;Long Term:  Able to use RPE to guide intensity level when exercising independently       Able to understand and use Dyspnea scale Yes       Intervention Provide education and explanation on how to use  Dyspnea scale       Expected Outcomes Short Term: Able to use Dyspnea scale daily in rehab to express subjective sense of shortness of breath during exertion;Long Term: Able to use Dyspnea scale to guide intensity level when exercising independently       Knowledge and understanding of Target Heart Rate Range (THRR) Yes       Intervention Provide education and explanation of THRR including how the numbers were predicted and where they are located for reference       Expected Outcomes Short Term: Able to state/look up THRR;Long Term: Able to use THRR to govern intensity when exercising independently;Short Term: Able to use daily as guideline for intensity in rehab       Able to check pulse independently Yes       Intervention Provide education and demonstration on how to check pulse in carotid and radial arteries.;Review the importance of being able to check your own pulse for safety during independent exercise       Expected Outcomes Short Term: Able to explain why pulse checking is important during independent exercise;Long Term: Able to check pulse independently and accurately       Understanding of Exercise Prescription Yes       Intervention Provide education, explanation, and written materials on patient's individual exercise prescription       Expected Outcomes Short Term: Able to explain program exercise prescription;Long Term: Able to explain home exercise prescription to exercise independently                Exercise Goals Re-Evaluation :  Exercise Goals Re-Evaluation     Row Name 04/29/21 1340 05/03/21 1042 05/12/21 1508 05/18/21 0726 05/31/21 1200     Exercise Goal Re-Evaluation   Exercise Goals Review Increase Physical Activity;Able to understand and use rate of perceived exertion (RPE) scale;Knowledge and understanding of Target Heart Rate Range (THRR);Understanding of Exercise Prescription;Able to understand and use Dyspnea scale;Increase  Strength and Stamina;Able to check pulse  independently Increase Physical Activity;Understanding of Exercise Prescription;Increase Strength and Stamina Increase Physical Activity;Understanding of Exercise Prescription;Increase Strength and Stamina -- Increase Physical Activity;Understanding of Exercise Prescription;Increase Strength and Stamina   Comments Reviewed RPE and dyspnea scales, THR and program prescription with pt today.  Pt voiced understanding and was given a copy of goals to take home. Jolaine Artist is doing well for her first day of exercise. We will continue to monitor her workloads and see how exercise is tolerated throughout her time in the program. Reviewed home exercise with pt today.  Pt plans to walk and do Youtube videos for exercise.  Reviewed THR, pulse, RPE, sign and symptoms, pulse oximetery and when to call 911 or MD.  Also discussed weather considerations and indoor options.  Pt voiced understanding. Jolaine Artist is doing well with exercise in her first sessions.  Oxygen has remained in the 90s on room air.  We will continue to monitor progress. Jolaine Artist is doing well in rehab.  She is up to 30 laps on the track and 2.1 METs on the T5 NuStep.  We will continue to monitor her progress.   Expected Outcomes Short: Use RPE daily to regulate intensity. Long: Follow program prescription in THR. Short: [place] exercise prescription as tolerated Long: Increase overall MET level Short: Add 1 day of exercise at home Long: Exercise independently at appropriate prescription Short: attend consistently Long: build overall stamina Short: Increase workload on T5  Long: Continue to improve stamina    Row Name 05/31/21 1357             Exercise Goal Re-Evaluation   Exercise Goals Review Increase Physical Activity;Understanding of Exercise Prescription;Increase Strength and Stamina       Comments Jolaine Artist is doing well in rehab.  She is regaining her strength and stamina and feels like classes have been good for her.  She is staying busy on her off days,  but no real exercise as looking after their son takes a lot of time.       Expected Outcomes Short: Continue to try to add in exercise at home Long: Continue to improve stamina.                Discharge Exercise Prescription (Final Exercise Prescription Changes):  Exercise Prescription Changes - 05/31/21 1200       Response to Exercise   Blood Pressure (Admit) 136/70    Blood Pressure (Exit) 132/72    Heart Rate (Admit) 86 bpm    Heart Rate (Exercise) 108 bpm    Heart Rate (Exit) 91 bpm    Oxygen Saturation (Admit) 95 %    Oxygen Saturation (Exercise) 95 %    Oxygen Saturation (Exit) 95 %    Rating of Perceived Exertion (Exercise) 15    Perceived Dyspnea (Exercise) 3    Symptoms SOB    Duration Continue with 30 min of aerobic exercise without signs/symptoms of physical distress.    Intensity THRR unchanged      Progression   Progression Continue to progress workloads to maintain intensity without signs/symptoms of physical distress.    Average METs 2.24      Resistance Training   Training Prescription Yes    Weight 2 lb    Reps 10-15      Interval Training   Interval Training No      T5 Nustep   Level 1    Minutes 15    METs 2.1  Biostep-RELP   Level 2    Minutes 15    METs 2      Track   Laps 30    Minutes 15    METs 2.63      Home Exercise Plan   Plans to continue exercise at Home (comment)   walking, Youtube videos   Frequency Add 2 additional days to program exercise sessions.   start with 1   Initial Home Exercises Provided 05/12/21      Oxygen   Maintain Oxygen Saturation 88% or higher             Nutrition:  Target Goals: Understanding of nutrition guidelines, daily intake of sodium '1500mg'$ , cholesterol '200mg'$ , calories 30% from fat and 7% or less from saturated fats, daily to have 5 or more servings of fruits and vegetables.  Education: All About Nutrition: -Group instruction provided by verbal, written material, interactive  activities, discussions, models, and posters to present general guidelines for heart healthy nutrition including fat, fiber, MyPlate, the role of sodium in heart healthy nutrition, utilization of the nutrition label, and utilization of this knowledge for meal planning. Follow up email sent as well. Written material given at graduation.   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 40 Therapeutic Level Cholesterol Diet  Flowsheet Row Pulmonary Rehab from [facility]/28/2021 [facility] Cardiac [facility] Pulmonary Rehab  Picture Your Plate Total Score on Admission 57      Picture Your Plate Scores: <62 Unhealthy dietary pattern with much room for improvement. 41-50 Dietary pattern unlikely to meet recommendations for good health and room for improvement. 51-60 More healthful dietary pattern, with some room for improvement.  >60 Healthy dietary pattern, although there may be some specific behaviors that could be improved.   Nutrition Goals Re-Evaluation:  Nutrition Goals Re-Evaluation     [place] Name 05/31/21 1402             Goals   Nutrition Goal Continue to follow diet       Comment Candi has a dietitian that she meets with regularly after her gastric sleeve surgery.  She does a good job following their routine and declines meeting with Melissa.       Expected Outcome Short: Continue to follow diet Long: Continue to eat healthy                Nutrition Goals Discharge (Final  Nutrition Goals Re-Evaluation):  Nutrition Goals Re-Evaluation - 05/31/21 1402       Goals   Nutrition Goal Continue to follow diet    Comment Candi has a dietitian that she meets with regularly after her gastric sleeve surgery.  She does a good job following their routine and declines meeting with Melissa.    Expected Outcome Short: Continue to  follow diet Long: Continue to eat healthy             Psychosocial: Target Goals: Acknowledge presence or absence of significant depression and/or stress, maximize coping skills, provide positive support system. Participant is able to verbalize types and ability to use techniques and skills needed for reducing stress and depression.   Education: Stress, Anxiety, and Depression - Group verbal and visual presentation to define topics covered.  Reviews how body is impacted by stress, anxiety, and depression.  Also discusses healthy ways to reduce stress and to treat/manage anxiety and depression.  Written material given at graduation.   Education: Sleep Hygiene -Provides group verbal and written instruction about how sleep can affect your health.  Define sleep hygiene, discuss sleep cycles and impact of sleep habits. Review good sleep hygiene tips.    Initial Review & Psychosocial Screening:  Initial Psych Review & Screening - 04/13/21 0945       Initial Review   Current issues with Current Anxiety/Panic;Current Stress Concerns   at times -anxiety- with dentist.     uses Xanax   .  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   Takes him food daily.   Does not need to stay with him.      Family Dynamics   Good Support System? Yes   Son, son in [place]     Barriers   Psychosocial barriers to participate in program There are no identifiable barriers or psychosocial needs.;The patient should benefit from training in stress management and  relaxation.      Screening Interventions   Interventions Encouraged to exercise;To provide support and resources with identified psychosocial needs;Provide feedback about the scores to participant    Expected Outcomes Short Term goal: Utilizing psychosocial counselor, staff and physician to assist with identification of specific Stressors or current issues interfering with healing process. Setting desired goal for each stressor or current issue identified.;Long Term Goal: Stressors or current issues are controlled or eliminated.;Short Term goal: Identification and review with participant of any Quality of Life or Depression concerns found by scoring the questionnaire.;Long Term goal: The participant improves quality of Life and PHQ9 Scores as seen by post scores and/or verbalization of changes             Quality of Life Scores:  Scores of 19 and below usually indicate a poorer quality of life in these areas.  A difference of  2-3 points is a clinically meaningful difference.  A difference of 2-3 points in the total score of the Quality of Life Index has been associated with significant improvement in overall quality of life, self-image, physical symptoms, and general health in studies assessing change in quality of life.  PHQ-9: Recent Review Flowsheet Data     Depression screen [facility] 2/9 04/28/2021 10/08/2020 06/01/2020 03/02/2020 02/19/2019   Decreased Interest 0 0 0 0 0   Down, Depressed, Hopeless 0 0 0 0 0   PHQ - 2 Score 0 0 0 0 0   Altered sleeping 0 1 - - -   Tired, decreased energy 1 1 - - -   Change in appetite 0 0 - - -   Feeling bad or failure about yourself  0 0 - - -   Trouble concentrating 0 0 - - -   Moving slowly or fidgety/restless 0 0 - - -   Suicidal thoughts 0  0 - - -   PHQ-9 Score 1 2 - - -   Difficult doing work/chores Not difficult at all Not difficult at all - - -      Interpretation of Total Score  Total Score Depression Severity:  1-4 = Minimal depression, 5-9  = Mild depression, 10-14 = Moderate depression, 15-19 = Moderately severe depression, 20-27 = Severe depression   Psychosocial Evaluation and Intervention:  Psychosocial Evaluation - 04/13/21 1034       Psychosocial Evaluation & Interventions   Interventions Encouraged to exercise with the program and follow exercise prescription;Relaxation education    Comments Jolaine Artist is ready to start the program,she has no barriers. She has 2 sons, one close by and the other in [place]. Her son nearby was recently diagnosed with HF and DM. He also has had COVID 2 times. SHe does help him with meals. He does not require her at his home for other help. They are her support system. She has recently had bariatric surgery and continues to work on weight loss. Her physician has told her she may not need some of her medications,secondary to the surgery. 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 She has found the backroad routes to get to her son's home in [place]. She has Xanax as needed for dentist appointmments. She doe shas spinal stenosis, which can limit her standing or walking. She did the [facility] program in 2016 and remembers the equipment and staff.   She should do fine in the program.    Expected Outcomes STG Candi attends all scheduled sessions, she continues to work on her weight loss. LTG: Candi is able to continue to progress with her exercise and weight loss    Continue Psychosocial Services  Follow up required by staff             Psychosocial Re-Evaluation:  Psychosocial Re-Evaluation     [place] Name 05/31/21 1359             Psychosocial Re-Evaluation   Current issues with Current Sleep Concerns;Current Stress Concerns       Comments Jolaine Artist is doing well in rehab. She does not sleep well most nights, the light from the moon bothers her. She does take naps on these days to help.  She is still helping her son with his heart failure.   He had  also said that he had lung cancer but that is not really true.  He had been overseas so she is unsure of what is really going on.  She is also trying to find a new doctor for herself.       Expected Outcomes Short: Continue to work on sleep Long: Continue to help and cope with son       Interventions Encouraged to attend Pulmonary Rehabilitation for the exercise;Stress management education       Continue Psychosocial Services  Follow up required by staff                Psychosocial Discharge (Final Psychosocial Re-Evaluation):  Psychosocial Re-Evaluation - 05/31/21 1359       Psychosocial Re-Evaluation   Current issues with Current Sleep Concerns;Current Stress Concerns    Comments Jolaine Artist is doing well in rehab. She does not sleep well most nights, the light from the moon bothers her. She does take naps on these days to help.  She is still helping her son with his heart failure.   He had also  said that he had lung cancer but that is not really true.  He had been overseas so she is unsure of what is really going on.  She is also trying to find a new doctor for herself.    Expected Outcomes Short: Continue to work on sleep Long: Continue to help and cope with son    Interventions Encouraged to attend Pulmonary Rehabilitation for the exercise;Stress management education    Continue Psychosocial Services  Follow up required by staff             Education: Education Goals: Education classes will be provided on a weekly basis, covering required topics. Participant will state understanding/return demonstration of topics presented.  Learning Barriers/Preferences:   General Pulmonary Education Topics:  Infection Prevention: - Provides verbal and written material to individual with discussion of infection control including proper hand washing and proper equipment cleaning during exercise session. Flowsheet Row Pulmonary Rehab from 06/02/2021 in [facility] Cardiac and Pulmonary Rehab  Education  need identified 04/28/21  Date 04/28/21  Educator [place]  Instruction Review Code 1- Verbalizes Understanding       Falls Prevention: - Provides verbal and written material to individual with discussion of falls prevention and safety. Flowsheet Row Pulmonary Rehab from 06/02/2021 in [facility] Cardiac and Pulmonary Rehab  Education need identified 04/28/21  Date 04/28/21  Educator [place]  Instruction Review Code 1- Verbalizes Understanding       Chronic Lung Disease Review: - Group verbal instruction with posters, models, PowerPoint presentations and videos,  to review new updates, new respiratory medications, new advancements in procedures and treatments. Providing information on websites and "800" numbers for continued self-education. Includes information about supplement oxygen, available portable oxygen systems, continuous and intermittent flow rates, oxygen safety, concentrators, and Medicare reimbursement for oxygen. Explanation of Pulmonary Drugs, including class, frequency, complications, importance of spacers, rinsing mouth after steroid MDI's, and proper cleaning methods for nebulizers. Review of basic lung anatomy and physiology related to function, structure, and complications of lung disease. Review of risk factors. Discussion about methods for diagnosing sleep apnea and types of masks and machines for OSA. Includes a review of the use of types of environmental controls: home humidity, furnaces, filters, dust mite/pet prevention, HEPA vacuums. Discussion about weather changes, air quality and the benefits of nasal washing. Instruction on Warning signs, infection symptoms, calling MD promptly, preventive modes, and value of vaccinations. Review of effective airway clearance, coughing and/or vibration techniques. Emphasizing that all should Create an Action Plan. Written material given at graduation. Flowsheet Row Pulmonary Rehab from 06/02/2021 in [facility] Cardiac and Pulmonary Rehab  Education need  identified 04/28/21  Date 05/19/21  Educator [facility]  Instruction Review Code 1- Verbalizes Understanding       AED/CPR: - Group verbal and written instruction with the use of models to demonstrate the basic use of the AED with the basic ABC's of resuscitation.    Anatomy and Cardiac Procedures: - Group verbal and visual presentation and models provide information about basic cardiac anatomy and function. Reviews the testing methods done to diagnose heart disease and the outcomes of the test results. Describes the treatment choices: Medical Management, Angioplasty, or Coronary Bypass Surgery for treating various heart conditions including Myocardial Infarction, Angina, Valve Disease, and Cardiac Arrhythmias.  Written material given at graduation.   Medication Safety: - Group verbal and visual instruction to review commonly prescribed medications for heart and lung disease. Reviews the medication, class of the drug, and side effects. Includes the steps to properly  store meds and maintain the prescription regimen.  Written material given at graduation.   Other: -Provides group and verbal instruction on various topics (see comments)   Knowledge Questionnaire Score:  Knowledge Questionnaire Score - 04/28/21 1236       Knowledge Questionnaire Score   Pre Score 15/18: O2 sats, O2 rx              Core Components/Risk Factors/Patient Goals at Admission:  Personal Goals and Risk Factors at Admission - 04/28/21 1303       Core Components/Risk Factors/Patient Goals on Admission    Weight Management Yes;Weight Loss    Intervention Weight Management: Develop a combined nutrition and exercise program designed to reach desired caloric intake, while maintaining appropriate intake of nutrient and fiber, sodium and fats, and appropriate energy expenditure required for the weight goal.;Weight Management: Provide education and appropriate resources to help participant work on and attain dietary  goals.;Weight Management/Obesity: Establish reasonable short term and long term weight goals.    Admit Weight 229 lb (103.9 kg)    Goal Weight: Short Term 224 lb (101.6 kg)    Goal Weight: Long Term 180 lb (81.6 kg)    Expected Outcomes Short Term: Continue to assess and modify interventions until short term weight is achieved;Weight Loss: Understanding of general recommendations for a balanced deficit meal plan, which promotes 1-2 lb weight loss per week and includes a negative energy balance of [id number] kcal/d;Long Term: Adherence to nutrition and physical activity/exercise program aimed toward attainment of established weight goal;Understanding recommendations for meals to include 15-35% energy as protein, 25-35% energy from fat, 35-60% energy from carbohydrates, less than $RemoveB'200mg'CWJBPOMq$  of dietary cholesterol, 20-35 gm of total fiber daily;Understanding of distribution of calorie intake throughout the day with the consumption of 4-5 meals/snacks    Increase knowledge of respiratory medications and ability to use respiratory devices properly  Yes    Intervention Provide education and demonstration as needed of appropriate use of medications, inhalers, and oxygen therapy.    Expected Outcomes Short Term: Achieves understanding of medications use. Understands that oxygen is a medication prescribed by physician. Demonstrates appropriate use of inhaler and oxygen therapy.;Long Term: Maintain appropriate use of medications, inhalers, and oxygen therapy.    Hypertension Yes    Intervention Provide education on lifestyle modifcations including regular physical activity/exercise, weight management, moderate sodium restriction and increased consumption of fresh fruit, vegetables, and low fat dairy, alcohol moderation, and smoking cessation.;Monitor prescription use compliance.    Expected Outcomes Short Term: Continued assessment and intervention until BP is < 140/36mm HG in hypertensive participants. < 130/76mm HG in  hypertensive participants with diabetes, heart failure or chronic kidney disease.;Long Term: Maintenance of blood pressure at goal levels.    Lipids Yes    Intervention Provide education and support for participant on nutrition & aerobic/resistive exercise along with prescribed medications to achieve LDL '70mg'$ , HDL >$Remo'40mg'ZCVOD$ .    Expected Outcomes Short Term: Participant states understanding of desired cholesterol values and is compliant with medications prescribed. Participant is following exercise prescription and nutrition guidelines.;Long Term: Cholesterol controlled with medications as prescribed, with individualized exercise RX and with personalized nutrition plan. Value goals: LDL < $Rem'70mg'lbFP$ , HDL > 40 mg.             Education:Diabetes - Individual verbal and written instruction to review signs/symptoms of diabetes, desired ranges of glucose level fasting, after meals and with exercise. Acknowledge that pre and post exercise glucose checks will be done for 3 sessions at entry  of program. Flowsheet Row Pulmonary Rehab from 06/02/2021 in [facility] Cardiac and Pulmonary Rehab  Education need identified 04/28/21  Date 04/28/21  Educator [place]  Instruction Review Code 1- Verbalizes Understanding       Know Your Numbers and Heart Failure: - Group verbal and visual instruction to discuss disease risk factors for cardiac and pulmonary disease and treatment options.  Reviews associated critical values for Overweight/Obesity, Hypertension, Cholesterol, and Diabetes.  Discusses basics of heart failure: signs/symptoms and treatments.  Introduces Heart Failure Zone chart for action plan for heart failure.  Written material given at graduation.   Core Components/Risk Factors/Patient Goals Review:   Goals and Risk Factor Review     Row Name 05/31/21 1403             Core Components/Risk Factors/Patient Goals Review   Personal Goals Review Weight Management/Obesity;Improve shortness of breath with  ADL's;Hypertension;Lipids       Review Jolaine Artist is doing well in rehab. Her pressures have been lower recently but she has not had any symptoms with them.  Her weight continues to trend downward which she likes.  Her breathing comes and goes with the weather.  Today is more of struggle today.  She continues to try to work on it. She is doing well with her medications.       Expected Outcomes Short: Continue to work on weight loss lOng: Continue to improve breathing.                Core Components/Risk Factors/Patient Goals at Discharge (Final Review):   Goals and Risk Factor Review - 05/31/21 1403       Core Components/Risk Factors/Patient Goals Review   Personal Goals Review Weight Management/Obesity;Improve shortness of breath with ADL's;Hypertension;Lipids    Review Jolaine Artist is doing well in rehab. Her pressures have been lower recently but she has not had any symptoms with them.  Her weight continues to trend downward which she likes.  Her breathing comes and goes with the weather.  Today is more of struggle today.  She continues to try to work on it. She is doing well with her medications.    Expected Outcomes Short: Continue to work on weight loss lOng: Continue to improve breathing.             ITP Comments:  ITP Comments     Row Name 04/13/21 1043 04/28/21 1206 04/29/21 1340 05/19/21 1225 06/11/21 0856   ITP Comments Virtual orientation call completed today. shehas an appointment on Date: 04/28/2021  for EP eval and gym Orientation.  Documentation of diagnosis [facility] be found in [facility] Date: 07/07/2020 . Completed 6MWT and gym orientation. Initial ITP created and sent for review to Dr. Ottie Glazier, Medical Director. First full day of exercise!  Patient was oriented to gym and equipment including functions, settings, policies, and procedures.  Patient's individual exercise prescription and treatment plan were reviewed.  All starting workloads were established based on the results of the 6  minute walk test done at initial orientation visit.  The plan for exercise progression was also introduced and progression will be customized based on patient's performance and goals. 30 day review completed. ITP sent to Dr. Zetta Bills, Medical Director of  Pulmonary Rehab. Continue with ITP unless changes are made by physician. Candi called to let us know that she had gotten a bill and has a $20 copay.  She cannot afford to continue to attend and would like to discharge at this time.  Comments: Discharge ITP

## 2021-06-15 ENCOUNTER — Telehealth: Payer: Self-pay | Admitting: *Deleted

## 2021-06-15 NOTE — Telephone Encounter (Signed)
Hayley- would you reach out to [facility]. Thanks- [employer]

## 2021-06-15 NOTE — Telephone Encounter (Signed)
CAll from [facility] asking to speak with patient nurse as this patient has been referred to them by her PCP

## 2021-06-16 NOTE — Telephone Encounter (Signed)
Spoke with Neoma Laming at [facility] to clarify reason for referral from PCP. Neoma Laming questioning if pt has lung cancer recurrence since current records do not show evidence of recurrence. Informed Neoma Laming that last imaging did not show evidence of lung cancer recurrence and MD recommends follow up CT scan in 12 months. Informed that do not see a need for referral for lung cancer based on recent visit unless pt prefers to transfer care or seeks second opinion. Neoma Laming informed that pt did not voice request for referral to [facility] at last visit and would need to clarify reason for referral with PCP.

## 2021-06-16 NOTE — Telephone Encounter (Signed)
Michelle Pitts made aware and they will work on getting patient scheduled.

## 2021-07-19 ENCOUNTER — Ambulatory Visit: Payer: Medicare PPO

## 2021-07-29 ENCOUNTER — Telehealth: Payer: Self-pay | Admitting: Internal Medicine

## 2021-07-29 NOTE — Telephone Encounter (Signed)
Done

## 2021-07-29 NOTE — Telephone Encounter (Signed)
Pt called to cancel all her appts. She is transferring to [facility]

## 2021-09-01 ENCOUNTER — Other Ambulatory Visit: Payer: Self-pay

## 2021-09-01 ENCOUNTER — Encounter: Payer: Medicare PPO | Attending: Surgery | Admitting: Skilled Nursing Facility1

## 2021-09-01 DIAGNOSIS — Z713 Dietary counseling and surveillance: Secondary | ICD-10-CM | POA: Diagnosis not present

## 2021-09-01 DIAGNOSIS — Z6837 Body mass index (BMI) 37.0-37.9, adult: Secondary | ICD-10-CM | POA: Diagnosis not present

## 2021-09-01 NOTE — Progress Notes (Signed)
Bariatric Nutrition Follow-Up Visit Medical Nutrition Therapy    NUTRITION ASSESSMENT    Anthropometrics  Surgery date: 12/07/2020 Surgery type: sleeve Start weight at [facility]: 262 pounds Weight today: 222.1 pounds   Clinical  Medical hx: COPD, GERD, anxiety, breast cancer, lung cancer Medications: see list; no longer taking trelegy and does not take gabapentin daily  Labs: cholesterol 221, Alk phos 139  Body Composition Scale 09/01/2021  Current Body Weight 222.1  Total Body Fat % 45.9  Visceral Fat 17  Fat-Free Mass % 54   Total Body Water % 41.5  Muscle-Mass lbs 27.5  BMI 37.6  Body Fat Displacement          Torso  lbs 63.2         Left Leg  lbs 12.6         Right Leg  lbs 12.6         Left Arm  lbs 6.3         Right Arm   lbs 6.3      Lifestyle & Dietary Hx   Pt states she stopped taking her calcium as she got a high reading last time.  Pt states she had a bought of vertigo.  Pt states her lung function has greatly improved.   Pt states she just really has no appetite and can/has gone a full 24 hrs without eating.  Pt state she cannot fit much in her stomach but notices she can fit bigger portions of seafood and does love seafood.   Estimated daily fluid intake: 50 oz Estimated daily protein intake: 60+ g Supplements: bari ultra solo + an extra vitamin D 3000 IU Current average weekly physical activity: ADL's trying to find a new place to work out  24-Hr Dietary Recall First Meal: protein shake + coffee + splenda or yogurt Snack:   Second Meal: chicken + cabbage Snack:   Third Meal: chicken + broccoli or chinese beef and broccoli or ham and cheese in mustard Snack: (3 days a week) balanced break or chips Beverages: water, gatorade g2  Post-Op Goals/ Signs/ Symptoms Using straws: no Drinking while eating: no Chewing/swallowing difficulties: no Changes in vision: no Changes to mood/headaches: no Hair loss/changes to skin/nails: no Difficulty  focusing/concentrating: no Sweating: no Dizziness/lightheadedness: no Palpitations: no  Carbonated/caffeinated beverages: no N/V/D/C/Gas: 3-4 times a week constipation: using mirilax daily  Abdominal pain: no Dumping syndrome: no    NUTRITION DIAGNOSIS  Overweight/obesity ([place]-3.3) related to past poor dietary habits and physical inactivity as evidenced by completed bariatric surgery and following dietary guidelines for continued weight loss and healthy nutrition status.     NUTRITION INTERVENTION Nutrition counseling (C-1) and education (E-2) to facilitate bariatric surgery goals, including: The importance of consuming adequate calories as well as certain nutrients daily due to the body's need for essential vitamins, minerals, and fats The importance of daily physical activity and to reach a goal of at least 150 minutes of moderate to vigorous physical activity weekly (or as directed by their physician) due to benefits such as increased musculature and improved lab values The importance of intuitive eating specifically learning hunger-satiety cues and understanding the importance of learning a new body: The importance of mindful eating to avoid grazing behaviors  What happens when I dont eat?  Your body uses food as fuel to keep all the important organs and cells in your body running well. When you dont eat, your body doesnt get the fuel it needs and your organs and body  parts can suffer.  The Heart & Circulation: Your heart is a muscle that can shrink and weaken when you dont eat. This can create circulation problems and an irregular heartbeat. Blood pressure can get very low during starvation and make you feel dizzy when you stand up.  The Stomach: Your stomach becomes smaller when you dont eat so when you start eating again, your stomach is likely to feel uncomfortable (you may have stomach aches and/or gas). Also, your stomach will not empty as fast making you feel full longer.  The  Intestines: Your intestines will move food slowly often resulting in constipation (trouble having a bowel movement) and/or stomach aches or cramps when you eat meals.  The Brain: Your brain, which controls the rest of your bodys functions, does not work properly without food. For example, you may have trouble thinking clearly or paying attention and/or you could also feel anxious or sad.  Body Cells: The balance of electrolytes in the blood can be changed with malnutrition or with purging. Without food, the amount of potassium and phosphorous can get dangerously low which can cause problems with your muscles, changes in your brain functioning, and cause life-threatening heart and rhythm problems.  Bones: When you dont eat, your bones often become weak due to low calcium and low hormone levels, which increases your risk of getting broken bones now and developing weak bones when youre older.  Body Temperature: Your body naturally lowers its temperature in times of starvation to conserve energy and protect vital organs. When this happens, there is a decrease in circulation (blood flow) to your fingers and toes which will often cause your hands and feet to feel cold and look bluish.  Skin: Your skin becomes dry when your body is not well hydrated and when it does not get enough vitamins and minerals from food. The skin will naturally protect your body during periods of starvation by developing fine, soft hair called lanugo that covers the skin to keep your body warm.  Hair: When your hair doesnt get enough nourishment from the vitamins and minerals that are naturally found in healthy food, it becomes dry, thin and it can even fall out.  Nails: Your nails require nutrients in the form of vitamins and minerals from your diet. When you dont eat, you deny your body what it needs and your nails become dry and brittle and break easily.  Teeth: Your teeth need vitamin D and calcium from food sources. Without  both of these minerals, you can end up with dental problems such as tooth decay and gum disease. Purging can also destroy tooth enamel.  Goals: -do not eat breaded or fried foods -split all of your meals in half and eat every 3 hours -avoid chicken, beef and pork -protein options: seafood and plant based   Handouts Provided Include    Learning Style & Readiness for Change Teaching method utilized: Visual & Auditory  Demonstrated degree of understanding via: Teach Back  Readiness Level: contemplative Barriers to learning/adherence to lifestyle change: unidentified   RD's Notes for Next Visit Assess adherence to pt chosen goals    MONITORING & EVALUATION Dietary intake, weekly physical activity, body weight  Next Steps Patient is to follow-up in 6 weeks

## 2021-10-13 ENCOUNTER — Other Ambulatory Visit: Payer: Self-pay

## 2021-10-13 ENCOUNTER — Encounter: Payer: Medicare PPO | Attending: Surgery | Admitting: Skilled Nursing Facility1

## 2021-10-13 DIAGNOSIS — Z713 Dietary counseling and surveillance: Secondary | ICD-10-CM | POA: Diagnosis not present

## 2021-10-13 DIAGNOSIS — Z6841 Body Mass Index (BMI) 40.0 and over, adult: Secondary | ICD-10-CM | POA: Insufficient documentation

## 2021-10-13 NOTE — Progress Notes (Signed)
Bariatric Nutrition Follow-Up Visit ?Medical Nutrition Therapy  ? ? ?NUTRITION ASSESSMENT ?  ? ?Anthropometrics  ?Surgery date: 12/07/2020 ?Surgery type: sleeve ?Start weight at [facility]: 262 pounds ?Weight today: 219.8 pounds ? ? ?Clinical  ?Medical hx: COPD, GERD, anxiety, breast cancer, lung cancer ?Medications: see list; no longer taking trelegy and does not take gabapentin daily  ?Labs: cholesterol 221, Alk phos 139 ? ?Body Composition Scale 09/01/2021 10/13/2021  ?Current Body Weight 222.1 219.8  ?Total Body Fat % 45.9 45.9  ?Visceral Fat 17 17  ?Fat-Free Mass % 54 54  ? Total Body Water % 41.5 41.5  ?Muscle-Mass lbs 27.5 27.2  ?BMI 37.6 37.6  ?Body Fat Displacement    ?       Torso  lbs 63.2 62.5  ?       Left Leg  lbs 12.6 12.5  ?       Right Leg  lbs 12.6 12.5  ?       Left Arm  lbs 6.3 6.2  ?       Right Arm   lbs 6.3 6.2  ? ? ?  ?Lifestyle & Dietary Hx ? ? ?(Previous appt) Pt states she stopped taking her calcium as she got a high reading last time. ? ? ?Pt states her bowel movements have gotten better. Pt states she does not cook at home and eats all of her meals out.  ?Pt states she has had some stress with her son being in ill health. Pt states she has had some episodes of blurry vision and muscle tightness and increased heart rate stating she never figured out what it was stating anxiety was discussed with her doctor.  ?Pt states she is not happy with her weight loss: pt states she eats out all meals and is not physically active. Pt states she has not been motivated really at all for behavior change. Pt states Her friend had the RYGB and that is why she has lost more weight than her: Dietitian educated pt on the lack of statistic difference between the sleeve ans RYGB for weight loss.  ?Pt states she has been under a lot of stress stating she was told her son is dieing.  ? ? ?Estimated daily fluid intake: 50 oz ?Estimated daily protein intake: 60+ g ?Supplements: bari ultra solo  ?Current average weekly  physical activity: ADL's trying to find a new place to work out ? ?24-Hr Dietary Recall ?First Meal: protein shake + coffee + splenda or yogurt ?Snack:   ?Second Meal: hamburger steak + mashed potatoes + salad ?Snack:   ?Third Meal: chicken + broccoli or chinese beef and broccoli or ham and cheese in mustard  ?Snack: (3 days a week) balanced break or chips ?Beverages: water, black coffee ? ?Post-Op Goals/ Signs/ Symptoms ?Using straws: no ?Drinking while eating: no ?Chewing/swallowing difficulties: no ?Changes in vision: no ?Changes to mood/headaches: no ?Hair loss/changes to skin/nails: no ?Difficulty focusing/concentrating: no ?Sweating: no ?Dizziness/lightheadedness: no ?Palpitations: no  ?Carbonated/caffeinated beverages: no ?N/V/D/C/Gas: 3-4 times a week constipation: using mirilax daily  ?Abdominal pain: no ?Dumping syndrome: no ? ?  ?NUTRITION DIAGNOSIS  ?Overweight/obesity ([place]-3.3) related to past poor dietary habits and physical inactivity as evidenced by completed bariatric surgery and following dietary guidelines for continued weight loss and healthy nutrition status. ?  ?  ?NUTRITION INTERVENTION ?Nutrition counseling (C-1) and education (E-2) to facilitate bariatric surgery goals, including: ?The importance of consuming adequate calories as well as certain nutrients daily due to the body's  need for essential vitamins, minerals, and fats ?The importance of daily physical activity and to reach a goal of at least 150 minutes of moderate to vigorous physical activity weekly (or as directed by their physician) due to benefits such as increased musculature and improved lab values ?The importance of intuitive eating specifically learning hunger-satiety cues and understanding the importance of learning a new body: The importance of mindful eating to avoid grazing behaviors  ?Encouraged patient to honor their body's internal hunger and fullness cues.  Throughout the day, check in mentally and rate hunger. Stop  eating when satisfied not full regardless of how much food is left on the plate.  Get more if still hungry 20-30 minutes later.  The key is to honor satisfaction so throughout the meal, rate fullness factor and stop when comfortably satisfied not physically full. The key is to honor hunger and fullness without any feelings of guilt or shame.  Pay attention to what the internal cues are, rather than any external factors. This will enhance the confidence you have in listening to your own body and following those internal cues enabling you to increase how often you eat when you are hungry not out of appetite and stop when you are satisfied not full.  ?Encouraged pt to continue to eat balanced meals inclusive of non starchy vegetables 2 times a day 7 days a week ?Encouraged pt to choose lean protein sources: limiting beef, pork, sausage, hotdogs, and lunch meat ?Encourage pt to choose healthy fats such as plant based limiting animal fats ?Encouraged pt to continue to drink a minium 64 fluid ounces with half being plain water to satisfy proper hydration  ? ? ?Goals: continued ?-do not eat breaded or fried foods ?-split all of your meals in half and eat every 3 hours ?-avoid chicken, beef and pork ?-protein options: seafood and plant based ? ?-find stress relief activities to conduct ?-avoid eating most meals out ?-increased physical activity: try having a resistance band at your sons house  ? ? ?Handouts Provided Include  ? ? ?Learning Style & Readiness for Change ?Teaching method utilized: Visual & Auditory  ?Demonstrated degree of understanding via: Teach Back  ?Readiness Level: contemplative ?Barriers to learning/adherence to lifestyle change: unidentified  ? ?RD's Notes for Next Visit ?Assess adherence to pt chosen goals  ? ? ?MONITORING & EVALUATION ?Dietary intake, weekly physical activity, body weight ? ?Next Steps ?Patient is to follow-up as needed: pt states she will call if needed ? ?

## 2021-10-29 ENCOUNTER — Encounter: Payer: Self-pay | Admitting: Nurse Practitioner

## 2021-10-29 ENCOUNTER — Ambulatory Visit (INDEPENDENT_AMBULATORY_CARE_PROVIDER_SITE_OTHER): Payer: Medicare PPO

## 2021-10-29 ENCOUNTER — Ambulatory Visit: Payer: Medicare PPO | Admitting: Nurse Practitioner

## 2021-10-29 ENCOUNTER — Other Ambulatory Visit: Payer: Self-pay

## 2021-10-29 ENCOUNTER — Ambulatory Visit: Payer: Medicare PPO | Admitting: Cardiovascular Disease

## 2021-10-29 VITALS — BP 140/90 | HR 80 | Ht 64.0 in | Wt 218.0 lb

## 2021-10-29 DIAGNOSIS — R002 Palpitations: Secondary | ICD-10-CM

## 2021-10-29 DIAGNOSIS — I2510 Atherosclerotic heart disease of native coronary artery without angina pectoris: Secondary | ICD-10-CM | POA: Diagnosis not present

## 2021-10-29 DIAGNOSIS — I10 Essential (primary) hypertension: Secondary | ICD-10-CM

## 2021-10-29 DIAGNOSIS — R072 Precordial pain: Secondary | ICD-10-CM

## 2021-10-29 DIAGNOSIS — E782 Mixed hyperlipidemia: Secondary | ICD-10-CM

## 2021-10-29 MED ORDER — METOPROLOL SUCCINATE ER 50 MG PO TB24: 50.0000 mg | ORAL_TABLET | Freq: Every day | ORAL | 3 refills | Status: DC

## 2021-10-29 MED ORDER — METOPROLOL TARTRATE 100 MG PO TABS: 100.0000 mg | ORAL_TABLET | Freq: Once | ORAL | 0 refills | Status: DC

## 2021-10-29 NOTE — Patient Instructions (Addendum)
Medication Instructions:  ?Your physician has recommended you make the following change in your medication:  ? ?INCREASE Toprol XL (metoprolol succinate) to 50 mg once daily ?2 hours prior to your CT take metoprolol tartrate 100 mg ? ?*If you need a refill on your cardiac medications before your next appointment, please call your pharmacy* ? ? ?Lab Work: ?BMET today ? ?If you have labs (blood work) drawn today and your tests are completely normal, you will receive your results only by: ?MyChart Message (if you have MyChart) OR ?A paper copy in the mail ?If you have any lab test that is abnormal or we need to change your treatment, we will call you to review the results. ? ? ?Testing/Procedures: ? ? ?Your cardiac CT is scheduled for 11/04/21 at 12:30 pm. Please arrive at 12:15 pm ? ?Location:   ? ?[facility] ?[address] ?[place], [place] [address] ?[phone] ? ? ?Please arrive 15 mins early for check-in and test prep. ? ?Please follow these instructions carefully (unless otherwise directed): ? ? ?On the Night Before the Test: ?Be sure to Drink plenty of water. ?Do not consume any caffeinated/decaffeinated beverages or chocolate 12 hours prior to your test. ?Do not take any antihistamines 12 hours prior to your test. ? ? ?On the Day of the Test: ?Drink plenty of water until 1 hour prior to the test. ?Do not eat any food 4 hours prior to the test. ?You may take your regular medications prior to the test.  ?Take metoprolol (Lopressor) 100 mg two hours prior to test. ?FEMALES- please wear underwire-free bra if available, avoid dresses & tight clothing ? ? ?     ?After the Test: ?Drink plenty of water. ?After receiving IV contrast, you may experience a mild flushed feeling. This is normal. ?On occasion, you may experience a mild rash up to 24 hours after the test. This is not dangerous. If this occurs, you can take Benadryl 25 mg and increase your fluid intake. ?If you  experience trouble breathing, this can be serious. If it is severe call 911 IMMEDIATELY. If it is mild, please call our office. ?If you take any of these medications: Glipizide/Metformin, Avandament, Glucavance, please do not take 48 hours after completing test unless otherwise instructed. ? ? ?For non-scheduling related questions, please contact the cardiac imaging nurse navigator should you have any questions/concerns: ?Marchia Bond, Cardiac Imaging Nurse Navigator ?Gordy Clement, Cardiac Imaging Nurse Navigator ?[facility] ?Direct Office Dial: [phone]  ? ?For scheduling needs, including cancellations and rescheduling, please call [place], [phone]. ? ? ? ?Your provider has ordered a heart monitor to wear for 14 days. This will be mailed to your home with instructions on placement. Once you have finished the time frame requested, you will return monitor in box provided. ? ? ? ? ?Follow-Up: ?At [facility], you and your health needs are our priority.  As part of our continuing mission to provide you with exceptional heart care, we have created designated Provider Care Teams.  These Care Teams include your primary Cardiologist (physician) and Advanced Practice Providers (APPs -  Physician Assistants and Nurse Practitioners) who all work together to provide you with the care you need, when you need it. ? ? ?Your next appointment:   ?6 week(s) ? ?The format for your next appointment:   ?In Person ? ?Provider:   ?Ida Rogue, MD or Murray Hodgkins, NP  ?

## 2021-10-29 NOTE — Progress Notes (Signed)
Office Visit    Patient Name: Michelle Pitts Date of Encounter: 10/29/2021  Primary Care Provider:  Cyndia Diver, PA-C Primary Cardiologist:  None  Chief Complaint    71 year old female with a history of tobacco abuse, lung cancer, COPD, obesity, aortic atherosclerosis, coronary calcifications, hypertension, hyperlipidemia, sleep apnea, and GERD, who presents for follow-up related to palpitations associated with chest tightness.  Past Medical History    Past Medical History:  Diagnosis Date   Anemia    Anxiety    Aortic atherosclerosis (HCC)    Breast cancer (HCC) 01/23/2014   Left breast, T1c, N0, M0. ER/PR +; Her 2 neu not overexpressed. Oncotype DX: 13,Low risk.9% over 10 years.    Cancer (HCC) 1995   vulvar, [facility]   COPD (chronic obstructive pulmonary disease) (HCC)    bronchitis   Coronary artery calcification seen on CT scan    Dysrhythmia    GERD (gastroesophageal reflux disease)    History of colon polyps    Hypercholesterolemia    Hypertension    Hypothyroidism    Insomnia    Lung cancer (HCC)    Palpitations    a. 07/2015 Holter: RSR, PVCs, 3 beat run of SVT.   Papilloma of breast 01/09/2014   Personal history of radiation therapy    Personal history of tobacco use, presenting hazards to health 11/18/2015   Sleep apnea    NO CPAP   SOB (shortness of breath) on exertion    a. 05/2020 Echo: EF 55-60^, no rwma, GrIDD, nl RV fxn.   Spinal stenosis    Vitamin D deficiency    Past Surgical History:  Procedure Laterality Date   BLADDER SUSPENSION  2001   BREAST BIOPSY Right    BREAST BIOPSY Left    BREAST CYST ASPIRATION Left 1985   BREAST EXCISIONAL BIOPSY Right 1975   x 2   BREAST SURGERY Left 01/23/2014   T1c, N0; Er/ PR positive, Her 2 negative   2 years anti-estrogen RX. Oncotype DX: 13,Low risk.9% over 10 years   BREAST SURGERY Left 04/10/2014   Debridement of fat necrosis, left breast cancer wide excision site.    CHOLECYSTECTOMY      COLONOSCOPY  2014   Dr Markham Jordan   COLONOSCOPY WITH PROPOFOL N/A 10/10/2016   Procedure: COLONOSCOPY WITH PROPOFOL;  Surgeon: Scot Jun, MD;  Location: [facility] ENDOSCOPY;  Service: Endoscopy;  Laterality: N/A;   ELECTROMAGNETIC NAVIGATION BROCHOSCOPY N/A 04/12/2018   Procedure: ELECTROMAGNETIC NAVIGATION BRONCHOSCOPY;  Surgeon: Erin Fulling, MD;  Location: [facility] ORS;  Service: Cardiopulmonary;  Laterality: N/A;   LAPAROSCOPIC BILATERAL SALPINGO OOPHERECTOMY Bilateral 06/27/2018   Procedure: LAPAROSCOPIC BILATERAL SALPINGO OOPHORECTOMY;  Surgeon: Natale Milch, MD;  Location: [facility] ORS;  Service: Gynecology;  Laterality: Bilateral;   LAPAROSCOPIC GASTRIC SLEEVE RESECTION N/A 12/07/2020   Procedure: LAPAROSCOPIC GASTRIC SLEEVE RESECTION;  Surgeon: Luretha Murphy, MD;  Location: [facility] ORS;  Service: General;  Laterality: N/A;   ROTATOR CUFF REPAIR Right 1995   UPPER GI ENDOSCOPY N/A 12/07/2020   Procedure: UPPER GI ENDOSCOPY;  Surgeon: Luretha Murphy, MD;  Location: [facility] ORS;  Service: General;  Laterality: N/A;   VAGINAL HYSTERECTOMY  1978   partial   VULVECTOMY PARTIAL  1995   wide excision debriedment Left 04/10/14    Allergies  Allergies  Allergen Reactions   Codeine Nausea And Vomiting   Cyanoacrylate Dermatitis    Dermabond   Gramineae Pollens     sinus   Hydrochlorothiazide Swelling and Other (See  Comments)    Swelling of tongue.   Levaquin [Levofloxacin In D5w] Hives, Swelling and Other (See Comments)    Leg swelling    Tape Itching and Rash    History of Present Illness    71 year old female with above past medical history including tobacco abuse, lung cancer, COPD, obesity, aortic atherosclerosis, coronary calcifications, hypertension, hyperlipidemia, sleep apnea, and GERD.  She was previously evaluated by Dr. Mariah Milling in November 2019 in the setting of abnormal EKG and preoperative evaluation prior to laparoscopic bilateral salpingo-oophorectomy.  EKG abnormalities were  felt to be secondary to lead placement.  Prior CTs were reviewed and showed minimal aortic atherosclerosis and coronary calcifications in the LAD.  She was later seen in September 2021, in the setting of dyspnea on exertion.  Stress testing was offered however, patient declined.  An echocardiogram was performed and showed an EF of 55 to 60% with grade 1 diastolic dysfunction, and normal RV function.  Ms. Sarduy has generally done well over the past 2 years.  She tries to stay active and does not typically experience chest pain or dyspnea.  About 3 to 4 weeks ago however, she was sitting at home and playing a game on her computer had sudden onset of tachypalpitations associated with flushing, mild diaphoresis, dyspnea, and mild chest tightness.  This persisted for about 30 minutes and then resolve spontaneously.  She had a second episode about 3 days later, which was brief in nature.  Since then, she has had about 2 episodes per week, each occurring fairly randomly, lasting up to 30 minutes, resolving spontaneously.  Her most recent episode occurred this morning, upon awakening.  When it happens, she can feel her heart beating in her throat.  She does not experience presyncope or syncope.  She is unaware of any particular triggers or alleviating factors.  She denies PND, orthopnea, edema, or early satiety.  Home Medications    Current Outpatient Medications  Medication Sig Dispense Refill   albuterol (VENTOLIN HFA) 108 (90 Base) MCG/ACT inhaler Inhale into the lungs.     ALPRAZolam (XANAX) 0.5 MG tablet Take 1 tablet (0.5 mg total) by mouth at bedtime as needed for anxiety. 30 tablet 5   ezetimibe (ZETIA) 10 MG tablet Take 1 tablet (10 mg total) by mouth daily. 30 tablet 11   fluticasone (FLONASE) 50 MCG/ACT nasal spray Place 2 sprays into both nostrils daily. 16 g 6   fluticasone (FLONASE) 50 MCG/ACT nasal spray Place into the nose.     gabapentin (NEURONTIN) 300 MG capsule Take by mouth.      metoprolol succinate (TOPROL-XL) 50 MG 24 hr tablet Take 1 tablet (50 mg total) by mouth daily. Take with or immediately following a meal. 90 tablet 3   metoprolol tartrate (LOPRESSOR) 100 MG tablet Take 1 tablet (100 mg total) by mouth once for 1 dose. Take 2 hours prior to your test. 1 tablet 0   Calcium Carbonate-Vitamin D (OYSTER SHELL CALCIUM/D PO) Take 1 tablet by mouth daily. Take 1 tablet by mouth in the morning and 1 tablet in the evening. Take with meals.     Cholecalciferol (VITAMIN D3) 50 MCG (2000 UT) TABS Take 2,000 Units by mouth daily.     Fluticasone-Umeclidin-Vilant (TRELEGY ELLIPTA) 100-62.5-25 MCG/INH AEPB Inhale 1 puff into the lungs daily.     gabapentin (NEURONTIN) 300 MG capsule Take 1 capsule (300 mg total) by mouth 2 (two) times daily as needed. For pain (Patient taking differently: Take 300 mg by  mouth in the morning.) 180 capsule 1   Multiple Vitamins-Iron (ONE-DAILY MULTI-VITAMIN/IRON PO) Take by mouth. Take by mouth once daily     ondansetron (ZOFRAN-ODT) 4 MG disintegrating tablet Take 1 tablet (4 mg total) by mouth every 6 (six) hours as needed for nausea or vomiting. (Patient not taking: Reported on 04/13/2021) 20 tablet 0   pantoprazole (PROTONIX) 40 MG tablet Take 1 tablet (40 mg total) by mouth daily. (Patient not taking: Reported on 04/13/2021) 30 tablet 0   No current facility-administered medications for this visit.     Review of Systems    Tachypalpitations associated with chest tightness, dyspnea, flushing, and diaphoresis occurring about twice a week over the past 3 to 4 weeks.  All other systems reviewed and are otherwise negative except as noted above.    Physical Exam    VS:  BP 140/90 (BP Location: Left Arm, Patient Position: Sitting, Cuff Size: Large)   Pulse 80   Ht 5\' 4"  (1.626 m)   Wt 218 lb (98.9 kg)   SpO2 95%   BMI 37.42 kg/m  , BMI Body mass index is 37.42 kg/m.     GEN: Obese, in no acute distress. HEENT: normal. Neck: Supple, no  JVD, carotid bruits, or masses. Cardiac: RRR, no murmurs, rubs, or gallops. No clubbing, cyanosis, edema.  Radials/PT 2+ and equal bilaterally.  Respiratory:  Respirations regular and unlabored, clear to auscultation bilaterally. GI: Soft, nontender, nondistended, BS + x 4. MS: no deformity or atrophy. Skin: warm and dry, no rash. Neuro:  Strength and sensation are intact. Psych: Normal affect.  Accessory Clinical Findings    ECG personally reviewed by me today -regular sinus rhythm, 80 - no acute changes.  Lab Results  Component Value Date   WBC 6.1 05/26/2021   HGB 12.8 05/26/2021   HCT 38.6 05/26/2021   MCV 93.0 05/26/2021   PLT 240 05/26/2021   Lab Results  Component Value Date   CREATININE 0.88 05/26/2021   BUN 24 (H) 05/26/2021   NA 141 05/26/2021   K 4.5 05/26/2021   CL 105 05/26/2021   CO2 27 05/26/2021   Lab Results  Component Value Date   ALT 28 05/26/2021   AST 23 05/26/2021   ALKPHOS 180 (H) 05/26/2021   BILITOT 0.2 (L) 05/26/2021   Lab Results  Component Value Date   CHOL 162 12/22/2020   HDL 47 12/22/2020   LDLCALC 88 12/22/2020   TRIG 155 (H) 12/22/2020   CHOLHDL 3.4 12/22/2020    Lab Results  Component Value Date   HGBA1C 5.6 12/22/2020   Labs from care everywhere dated October 04, 2021  Hemoglobin 13.1, hematocrit 39.0, WBC 7.2, platelets 197 TSH 1.914  Assessment & Plan    1.  Tachypalpitations: Over the past 3 to 4 weeks, patient has been experiencing intermittent tachypalpitations, occurring about twice a week, associated with dyspnea, mild chest tightness, flushing, and mild diaphoresis, lasting about 30 minutes, and resolving spontaneously.  She is unaware of any particular triggers or alleviating factors.  Her last episode occurred upon awakening this morning.  She is in sinus rhythm at a rate of 80 today.  We discussed options for evaluation.  I am going to place a 2-week ZIO monitor so that we can assure capture of her rhythm during  these episodes.  She recently had labs at [facility] showing normal CBC and TSH.  Follow-up basic metabolic panel today.  I will increase her metoprolol XL to 50 mg daily.  2.  Chest tightness/coronary calcifications: In the setting of tachypalpitations, patient has been experiencing chest tightness and dyspnea.  She has known prior history of coronary calcium on CTs and ongoing risk factors including hypertension, hyperlipidemia, and obesity.  In that setting, follow-up basic metabolic panel today and arrange for coronary CT angiography.  She was agreeable with this plan.  Titrating beta-blocker as above.  Continue Zetia.  3.  Essential hypertension: Blood pressure has been higher at home recently.  She was 140/90 today.  I am increasing the Toprol XL to 50 mg daily.  4.  Hyperlipidemia: LDL of 88 in May 2022 on Zetia therapy.  She previously declined statin therapy.  5.  COPD: Patient generally does quite well without dyspnea or wheezing.  She remains on inhaler therapy.  6.  Lung cancer: Followed by oncology and overall doing well.  7.  Disposition: Follow-up basic metabolic panel today.  Follow-up coronary CT angiography and Zio monitoring.  Follow-up in clinic in 4 to 6 weeks or sooner if necessary.    Nicolasa Ducking, NP 10/29/2021, 11:53 AM

## 2021-10-30 LAB — BASIC METABOLIC PANEL
BUN/Creatinine Ratio: 23 (ref 12–28)
BUN: 18 mg/dL (ref 8–27)
CO2: 25 mmol/L (ref 20–29)
Calcium: 10 mg/dL (ref 8.7–10.3)
Chloride: 104 mmol/L (ref 96–106)
Creatinine, Ser: 0.79 mg/dL (ref 0.57–1.00)
Glucose: 101 mg/dL — ABNORMAL HIGH (ref 70–99)
Potassium: 4.7 mmol/L (ref 3.5–5.2)
Sodium: 141 mmol/L (ref 134–144)
eGFR: 79 mL/min/{1.73_m2} (ref >59)

## 2021-11-03 ENCOUNTER — Telehealth (HOSPITAL_COMMUNITY): Payer: Self-pay | Admitting: *Deleted

## 2021-11-03 NOTE — Telephone Encounter (Signed)
Reaching out to patient to offer assistance regarding upcoming cardiac imaging study; pt verbalizes understanding of appt date/time, parking situation and where to check in, pre-test NPO status and medications ordered, and verified current allergies; name and call back number provided for further questions should they arise ? ?Gordy Clement RN Navigator Cardiac Imaging ?[facility] ?[phone] office ?[phone] cell ? ?Patient to take 100mg  metoprolol tartrate two hours prior to her cardiac CT scan.  ?

## 2021-11-04 ENCOUNTER — Ambulatory Visit
Admission: RE | Admit: 2021-11-04 | Discharge: 2021-11-04 | Disposition: A | Discharge status: Home / Self Care | Payer: Medicare PPO | Source: Ambulatory Visit | Attending: Nurse Practitioner | Admitting: Nurse Practitioner

## 2021-11-04 ENCOUNTER — Other Ambulatory Visit: Payer: Self-pay

## 2021-11-04 DIAGNOSIS — R072 Precordial pain: Secondary | ICD-10-CM

## 2021-11-04 MED ORDER — NITROGLYCERIN 0.4 MG SL SUBL
0.8000 mg | SUBLINGUAL_TABLET | Freq: Once | SUBLINGUAL | Status: AC
Start: 2021-11-04 — End: 2021-11-04
  Administered 2021-11-04: 0.8 mg via SUBLINGUAL

## 2021-11-04 MED ORDER — METOPROLOL TARTRATE 5 MG/5ML IV SOLN
10.0000 mg | Freq: Once | INTRAVENOUS | Status: AC
  Administered 2021-11-04: 10 mg via INTRAVENOUS

## 2021-11-04 MED ORDER — IOHEXOL 350 MG/ML SOLN
100.0000 mL | Freq: Once | INTRAVENOUS | Status: AC | PRN
  Administered 2021-11-04: 100 mL via INTRAVENOUS

## 2021-11-04 MED ORDER — NITROGLYCERIN 0.4 MG SL SUBL: 0.8000 mg | SUBLINGUAL_TABLET | Freq: Once | SUBLINGUAL | Status: DC

## 2021-11-04 MED ORDER — DILTIAZEM HCL 25 MG/5ML IV SOLN
10.0000 mg | Freq: Once | INTRAVENOUS | Status: AC
Start: 2021-11-04 — End: 2021-11-04
  Administered 2021-11-04: 10 mg via INTRAVENOUS

## 2021-11-04 NOTE — Progress Notes (Signed)
Patient tolerated CT well. Drank water after. Vital signs stable encourage to drink water throughout day.Reasons explained and verbalized understanding. Ambulated steady gait.  

## 2021-11-05 ENCOUNTER — Telehealth: Payer: Self-pay | Admitting: *Deleted

## 2021-11-05 ENCOUNTER — Encounter: Payer: Self-pay | Admitting: Nurse Practitioner

## 2021-11-05 DIAGNOSIS — R002 Palpitations: Secondary | ICD-10-CM

## 2021-11-05 DIAGNOSIS — R072 Precordial pain: Secondary | ICD-10-CM

## 2021-11-05 NOTE — Telephone Encounter (Signed)
-----   Message from Theora Gianotti, NP sent at 11/05/2021 12:07 PM EDT ----- ?Coronary calcium score of 65.9.  Less than 25% blockage in the left anterior descending artery and otherwise no significant plaque.  Overall a reassuring cardiac study.  Continue Zetia for management of cholesterol. ?Noncardiac findings include nodular area of the left lung lower lobe, which was previously noted and unchanged in size-felt to be secondary to prior cancer treatment/radiation. ? ?

## 2021-11-05 NOTE — Telephone Encounter (Signed)
Left voicemail message to call back for review of results.  

## 2021-11-08 NOTE — Telephone Encounter (Signed)
Patient calling to discuss recent testing results  ° °Please call  ° °

## 2021-11-08 NOTE — Telephone Encounter (Signed)
Attempted to contact the patient with results. ?No answer- I left a message to please call back.  ? ?

## 2021-11-09 NOTE — Telephone Encounter (Signed)
Reviewed results and recommendations with patient. She also confirmed next appointment to see Dr. Rockey Situ. She verbalized understanding with no further questions at this time.  ?

## 2021-11-29 ENCOUNTER — Telehealth: Payer: Self-pay | Admitting: *Deleted

## 2021-11-29 NOTE — Telephone Encounter (Signed)
-----   Message from Theora Gianotti, NP sent at 11/29/2021  9:05 AM EDT ----- ?Predominantly normal sinus rhythm.  1 brief run of extra heart beats from the bottom chambers.  14 brief runs of extra heart beats from the top chambers.  It's possible that either type of arrhythmia could account for symptoms that she previously experienced, though there were no triggered events to suggest that she was symptomatic while wearing the monitor.  Cont metoprolol. ?

## 2021-11-29 NOTE — Telephone Encounter (Signed)
Reviewed results and recommendations with patient. She verbalized understanding with no further questions at this time.  ?

## 2021-11-30 ENCOUNTER — Ambulatory Visit: Payer: Medicare PPO | Admitting: Cardiovascular Disease

## 2021-12-12 NOTE — Progress Notes (Signed)
Cardiology Office Note ? ?Date:  12/13/2021  ? ?ID:  Michelle Pitts, DOB 05-02-1951, MRN [id number] ? ?PCP:  Dionicia Abler, PA-C  ? ?Chief Complaint  ?Patient presents with  ? Follow up CTA   ?  "Doing well." Medications reviewed by the patient verbally.   ? ? ?HPI:  ?Michelle Pitts is a 71 year old woman with past medical history of ?Breast cancer ?Lung cancer, XRT, former smoker quit 71 yo ?Statin intolerance ?XRT in 2015, breast ?XRT 2019, lung ?Who presents for f/u of her aortic atherosclerosis on CT scan, CAD, shortness of breath ? ?LOV with myself 04/2020 ?Seen by one of our providers, October 29, 2021, reported having chest tightness  ?cardiac CTA ordered ?CT scan with minimal coronary calcification 65 ?Nonobstructive coronary disease ? ?Zio monitor reviewed, ?Patient had a min HR of 54 bpm, max HR of 179 bpm, and avg HR of 79 bpm ?1 run of Ventricular Tachycardia occurred lasting 7 beats with a max rate of 154 bpm (avg 124 bpm).  ?14 Supraventricular Tachycardia runs occurred, the run with the fastest interval lasting 12 beats with a max rate of 179 bpm, the longest lasting 20 beats with an avg rate of 128 bpm. Isolated SVEs were rare (<1.0%), SVE Couplets were rare (<1.0%), and SVE Triplets were rare (<1.0%).  ?Isolated VEs were rare (<1.0%), and no VE Couplets or VE Triplets were present.  ?No patient triggered events recorded ? ?For elevated blood pressure metoprolol XL increased up to 50 daily ?On Zetia, declined statin ? ?Nondiabetic, nonsmoker ? ?BP 767 systolic at home ? ?EKG personally reviewed by myself on todays visit ?Normal sinus rhythm rate 83 bpm no significant ST-T wave changes ? ?PMH:   has a past medical history of Anemia, Anxiety, Aortic atherosclerosis ([place]), Breast cancer ([place]) (01/23/2014), Cancer ([place]) (1995), COPD (chronic obstructive pulmonary disease) ([place]), Coronary artery calcification seen on CT scan, Dysrhythmia, GERD (gastroesophageal reflux disease), History of colon  polyps, Hypercholesterolemia, Hypertension, Hypothyroidism, Insomnia, Lung cancer ([place]), Palpitations, Papilloma of breast (01/09/2014), Personal history of radiation therapy, Personal history of tobacco use, presenting hazards to health (11/18/2015), Sleep apnea, SOB (shortness of breath) on exertion, Spinal stenosis, and Vitamin D deficiency. ? ?PSH:    ?Past Surgical History:  ?Procedure Laterality Date  ? BLADDER SUSPENSION  2001  ? BREAST BIOPSY Right   ? BREAST BIOPSY Left   ? BREAST CYST ASPIRATION Left 1985  ? BREAST EXCISIONAL BIOPSY Right 1975  ? x 2  ? BREAST SURGERY Left 01/23/2014  ? T1c, N0; Er/ PR positive, Her 2 negative   2 years anti-estrogen RX. Oncotype DX: 13,Low risk.9% over 10 years  ? BREAST SURGERY Left 04/10/2014  ? Debridement of fat necrosis, left breast cancer wide excision site.   ? CHOLECYSTECTOMY    ? COLONOSCOPY  2014  ? Dr Tiffany Kocher  ? COLONOSCOPY WITH PROPOFOL N/A 10/10/2016  ? Procedure: COLONOSCOPY WITH PROPOFOL;  Surgeon: Manya Silvas, MD;  Location: [facility] ENDOSCOPY;  Service: Endoscopy;  Laterality: N/A;  ? ELECTROMAGNETIC NAVIGATION BROCHOSCOPY N/A 04/12/2018  ? Procedure: ELECTROMAGNETIC NAVIGATION BRONCHOSCOPY;  Surgeon: Flora Lipps, MD;  Location: [facility] ORS;  Service: Cardiopulmonary;  Laterality: N/A;  ? LAPAROSCOPIC BILATERAL SALPINGO OOPHERECTOMY Bilateral 06/27/2018  ? Procedure: LAPAROSCOPIC BILATERAL SALPINGO OOPHORECTOMY;  Surgeon: Homero Fellers, MD;  Location: [facility] ORS;  Service: Gynecology;  Laterality: Bilateral;  ? LAPAROSCOPIC GASTRIC SLEEVE RESECTION N/A 12/07/2020  ? Procedure: LAPAROSCOPIC GASTRIC SLEEVE RESECTION;  Surgeon: Johnathan Hausen, MD;  Location: [facility]S;  Service: General;  Laterality: N/A;  ? ROTATOR CUFF REPAIR Right 1995  ? UPPER GI ENDOSCOPY N/A 12/07/2020  ? Procedure: UPPER GI ENDOSCOPY;  Surgeon: Johnathan Hausen, MD;  Location: [facility] ORS;  Service: General;  Laterality: N/A;  ? [place]  ? partial  ? [place]  ? wide excision debriedment Left 04/10/14  ? ? ?Current Outpatient Medications  ?Medication Sig Dispense Refill  ? albuterol (VENTOLIN HFA) 108 (90 Base) MCG/ACT inhaler Inhale into the lungs.    ? ALPRAZolam (XANAX) 0.5 MG tablet Take 1 tablet (0.5 mg total) by mouth at bedtime as needed for anxiety. 30 tablet 5  ? ezetimibe (ZETIA) 10 MG tablet Take 1 tablet (10 mg total) by mouth daily. 30 tablet 11  ? fluticasone (FLONASE) 50 MCG/ACT nasal spray Place into the nose.    ? metoprolol succinate (TOPROL-XL) 50 MG 24 hr tablet Take 1 tablet (50 mg total) by mouth daily. Take with or immediately following a meal. 90 tablet 3  ? Calcium Carbonate-Vitamin D (OYSTER SHELL CALCIUM/D PO) Take 1 tablet by mouth daily. Take 1 tablet by mouth in the morning and 1 tablet in the evening. Take with meals. (Patient not taking: Reported on 12/13/2021)    ? Cholecalciferol (VITAMIN D3) 50 MCG (2000 UT) TABS Take 2,000 Units by mouth daily. (Patient not taking: Reported on 12/13/2021)    ? fluticasone (FLONASE) 50 MCG/ACT nasal spray Place 2 sprays into both nostrils daily. (Patient not taking: Reported on 12/13/2021) 16 g 6  ? Fluticasone-Umeclidin-Vilant (TRELEGY ELLIPTA) 100-62.5-25 MCG/INH AEPB Inhale 1 puff into the lungs daily. (Patient not taking: Reported on 12/13/2021)    ? gabapentin (NEURONTIN) 300 MG capsule Take 1 capsule (300 mg total) by mouth 2 (two) times daily as needed. For pain (Patient not taking: Reported on 12/13/2021) 180 capsule 1  ? gabapentin (NEURONTIN) 300 MG capsule Take by mouth. (Patient not taking: Reported on 12/13/2021)    ? Multiple Vitamins-Iron (ONE-DAILY MULTI-VITAMIN/IRON PO) Take by mouth. Take by mouth once daily (Patient not taking: Reported on 12/13/2021)    ? ondansetron (ZOFRAN-ODT) 4 MG disintegrating tablet Take 1 tablet (4 mg total) by mouth every 6 (six) hours as needed for nausea or vomiting. (Patient not taking: Reported on 04/13/2021) 20 tablet 0  ? pantoprazole (PROTONIX) 40 MG tablet Take  1 tablet (40 mg total) by mouth daily. (Patient not taking: Reported on 04/13/2021) 30 tablet 0  ? ?No current facility-administered medications for this visit.  ? ? ?Allergies:   Hydrochlorothiazide, Levofloxacin, Other, Gramineae pollens, Levaquin [levofloxacin in d5w], Codeine, Cyanoacrylate, Denture adhesive, and Tape  ? ?Social History:  The patient  reports that she quit smoking about 7 years ago. Her smoking use included cigarettes. She has a 50.00 pack-year smoking history. She has never used smokeless tobacco. She reports current alcohol use of about 7.0 standard drinks per week. She reports that she does not use drugs.  ? ?Family History:   family history includes Breast cancer in an other family member; COPD in her mother; Cancer in an other family member; Cancer (age of onset: 59) in her maternal grandfather; Cancer (age of onset: 70) in her paternal grandfather; Crohn's disease in her sister; Diabetes in her father and sister; Emphysema in her brother, brother, and sister; Heart failure in her sister; Hypertension in her sister, sister, and sister; Lung cancer in her brother.  ? ? ?Review of Systems: ?Review of Systems  ?Constitutional: Negative.   ?HENT:  Negative.    ?Respiratory: Negative.    ?Cardiovascular: Negative.   ?Gastrointestinal: Negative.   ?Musculoskeletal: Negative.   ?Neurological: Negative.   ?Psychiatric/Behavioral: Negative.    ?All other systems reviewed and are negative. ? ?PHYSICAL EXAM: ?VS:  BP (!) 150/82 (BP Location: Left Arm, Patient Position: Sitting, Cuff Size: Normal)   Pulse 83   Ht 5' 4.5" (1.638 m)   Wt 213 lb 8 oz (96.8 kg)   SpO2 98%   BMI 36.08 kg/m?  , BMI Body mass index is 36.08 kg/m?Marland Kitchen ?Constitutional:  oriented to person, place, and time. No distress.  ?HENT:  ?Head: Grossly normal ?Eyes:  no discharge. No scleral icterus.  ?Neck: No JVD, no carotid bruits  ?Cardiovascular: Regular rate and rhythm, no murmurs appreciated ?Pulmonary/Chest: Clear to auscultation  bilaterally, no wheezes or rails ?Abdominal: Soft.  no distension.  no tenderness.  ?Musculoskeletal: Normal range of motion ?Neurological:  normal muscle tone. Coordination normal. No atrophy ?Skin: Skin warm

## 2021-12-13 ENCOUNTER — Encounter: Payer: Self-pay | Admitting: Cardiovascular Disease

## 2021-12-13 ENCOUNTER — Ambulatory Visit: Payer: Medicare PPO | Admitting: Cardiovascular Disease

## 2021-12-13 VITALS — BP 150/82 | HR 83 | Ht 64.5 in | Wt 213.5 lb

## 2021-12-13 DIAGNOSIS — I700 Atherosclerosis of aorta: Secondary | ICD-10-CM | POA: Diagnosis not present

## 2021-12-13 DIAGNOSIS — R0602 Shortness of breath: Secondary | ICD-10-CM | POA: Diagnosis not present

## 2021-12-13 DIAGNOSIS — J411 Mucopurulent chronic bronchitis: Secondary | ICD-10-CM

## 2021-12-13 DIAGNOSIS — I10 Essential (primary) hypertension: Secondary | ICD-10-CM | POA: Diagnosis not present

## 2021-12-13 DIAGNOSIS — I2510 Atherosclerotic heart disease of native coronary artery without angina pectoris: Secondary | ICD-10-CM

## 2021-12-13 MED ORDER — AMLODIPINE BESYLATE 5 MG PO TABS: 5.0000 mg | ORAL_TABLET | Freq: Every day | ORAL | 3 refills | Status: DC

## 2021-12-13 NOTE — Patient Instructions (Addendum)
Medication Instructions:  ?Please start amlodipine 5 mg daily ? ?Monitor blood pressure at home ? ?If you need a refill on your cardiac medications before your next appointment, please call your pharmacy.  ? ?Lab work: ?No new labs needed ? ?Testing/Procedures: ?No new testing needed ? ?Follow-Up: ?At [facility], you and your health needs are our priority.  As part of our continuing mission to provide you with exceptional heart care, we have created designated Provider Care Teams.  These Care Teams include your primary Cardiologist (physician) and Advanced Practice Providers (APPs -  Physician Assistants and Nurse Practitioners) who all work together to provide you with the care you need, when you need it. ? ?You will need a follow up appointment in 12 months ? ?Providers on your designated Care Team:   ?Murray Hodgkins, NP ?Christell Faith, PA-C ?Cadence Kathlen Mody, PA-C ? ?COVID-19 Vaccine Information can be found at: [email or web address].uk For questions related to vaccine distribution or appointments, please email [email or web address] or call [phone].  ? ?

## 2021-12-15 NOTE — Addendum Note (Signed)
Addended by: Anselm Pancoast on: 12/15/2021 09:22 AM ? ? Modules accepted: Orders ? ?

## 2022-01-24 ENCOUNTER — Other Ambulatory Visit: Payer: Self-pay | Admitting: General Surgery

## 2022-01-24 DIAGNOSIS — C50312 Malignant neoplasm of lower-inner quadrant of left female breast: Secondary | ICD-10-CM

## 2022-01-30 ENCOUNTER — Encounter: Payer: Self-pay | Admitting: Emergency Medicine

## 2022-01-30 ENCOUNTER — Ambulatory Visit
Admission: EM | Admit: 2022-01-30 | Discharge: 2022-01-30 | Disposition: A | Discharge status: Home / Self Care | Payer: Medicare PPO | Attending: Emergency Medicine | Admitting: Emergency Medicine

## 2022-01-30 DIAGNOSIS — Z87891 Personal history of nicotine dependence: Secondary | ICD-10-CM | POA: Diagnosis not present

## 2022-01-30 DIAGNOSIS — Z20822 Contact with and (suspected) exposure to covid-19: Secondary | ICD-10-CM | POA: Diagnosis not present

## 2022-01-30 DIAGNOSIS — R059 Cough, unspecified: Secondary | ICD-10-CM | POA: Diagnosis present

## 2022-01-30 DIAGNOSIS — H9209 Otalgia, unspecified ear: Secondary | ICD-10-CM | POA: Diagnosis not present

## 2022-01-30 DIAGNOSIS — Z836 Family history of other diseases of the respiratory system: Secondary | ICD-10-CM | POA: Diagnosis not present

## 2022-01-30 DIAGNOSIS — J441 Chronic obstructive pulmonary disease with (acute) exacerbation: Secondary | ICD-10-CM | POA: Diagnosis not present

## 2022-01-30 DIAGNOSIS — Z7951 Long term (current) use of inhaled steroids: Secondary | ICD-10-CM | POA: Insufficient documentation

## 2022-01-30 LAB — SARS CORONAVIRUS 2 BY RT PCR: SARS Coronavirus 2 by RT PCR: NEGATIVE

## 2022-01-30 MED ORDER — PROMETHAZINE-DM 6.25-15 MG/5ML PO SYRP: 5.0000 mL | ORAL_SOLUTION | Freq: Four times a day (QID) | ORAL | 0 refills | Status: DC | PRN

## 2022-01-30 MED ORDER — AMOXICILLIN-POT CLAVULANATE 875-125 MG PO TABS: 1.0000 | ORAL_TABLET | Freq: Two times a day (BID) | ORAL | 0 refills | Status: AC

## 2022-01-30 MED ORDER — BENZONATATE 100 MG PO CAPS: 200.0000 mg | ORAL_CAPSULE | Freq: Three times a day (TID) | ORAL | 0 refills | Status: DC

## 2022-01-30 MED ORDER — IPRATROPIUM BROMIDE 0.06 % NA SOLN: 2.0000 | Freq: Four times a day (QID) | NASAL | 12 refills | Status: DC

## 2022-01-30 MED ORDER — PREDNISONE 20 MG PO TABS: 60.0000 mg | ORAL_TABLET | Freq: Every day | ORAL | 0 refills | Status: AC

## 2022-01-30 NOTE — ED Provider Notes (Signed)
[facility]    CSN: [id number] Arrival date & time: 01/30/22  1059      History   Chief Complaint Chief Complaint  Patient presents with   Cough    HPI Michelle Pitts is a 71 y.o. female.   HPI  71 year old female here for evaluation of respiratory complaints.  Patient reports that she has been experiencing a productive cough and chest congestion for a white sputum for the past 4 days.  She has had some nasal congestion but she states that her nasal discharge has been clear.  She also endorses some mild ear pain and sore throat.  When asked about a fever she states the maximum temperature has been is 99.  She does endorse shortness of breath and wheezing.  She does have a history of COPD for which she takes albuterol.  She does endorse that she has had an increase in the use of her butyryl to help her with her shortness of breath symptoms.  She took a home COVID test which was negative.  Past Medical History:  Diagnosis Date   Anemia    Anxiety    Aortic atherosclerosis ([place])    Breast cancer ([place]) 01/23/2014   Left breast, T1c, N0, M0. ER/PR +; Her 2 neu not overexpressed. Oncotype DX: 13,Low risk.9% over 10 years.    Cancer ([place]) 1995   vulvar, [facility]   COPD (chronic obstructive pulmonary disease) (H[facility])    bronchitis   Coronary artery calcification seen on CT scan    Dysrhythmia    GERD (gastroesophageal reflux disease)    History of colon polyps    Hypercholesterolemia    Hypertension    Hypothyroidism    Insomnia    Lung cancer ([place])    Palpitations    a. 07/2015 Holter: RSR, PVCs, 3 beat run of SVT.   Papilloma of breast 01/09/2014   Personal history of radiation therapy    Personal history of tobacco use, presenting hazards to health 11/18/2015   Sleep apnea    NO CPAP   SOB (shortness of breath) on exertion    a. 05/2020 Echo: EF 55-60%, no rwma, GrIDD, nl RV fxn.   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   Primary cancer of left lower lobe of lung ([place]) 11/25/2020   S/P bilateral oophorectomy 06/27/2018   Aortic atherosclerosis ([place]) 06/21/2018   Malignant neoplasm of lung ([place]) 06/21/2018   Abnormal EKG 06/21/2018   Coronary artery calcification seen on CT scan 06/21/2018   Pneumothorax on left 04/18/2018   Pneumothorax 04/13/2018   Pneumothorax after biopsy 04/12/2018   Bilateral leg numbness 11/10/2017   Obesity, Class III, BMI 40-49.9 (morbid obesity) ([place]) 11/10/2017   DNR no code (do not resuscitate) 02/08/2016   Personal history of tobacco use, presenting hazards to health 11/18/2015   Shortness of breath 07/24/2015   Malignant neoplasm of lower-inner quadrant of left female breast ([place]) 07/22/2015   Tachycardia 07/22/2015   Anxiety 05/15/2015   Colon polyp 05/15/2015   CAFL (chronic airflow limitation) ([place]) 05/15/2015   Elevated blood sugar 05/15/2015   Abnormal liver enzymes 05/15/2015   Family history of diabetes mellitus 05/15/2015   BP (high blood pressure) 05/15/2015   Cannot sleep 05/15/2015   Apnea, sleep 05/15/2015   Avitaminosis D 05/15/2015   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   Anxiety disorder  05/15/2015   Chronic obstructive pulmonary disease ([place]) 05/15/2015   Gastro-esophageal reflux disease without esophagitis 05/15/2015   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 Oncotype DX: 13,Low risk.9% over 10 years   BREAST SURGERY Left 04/10/2014   Debridement of  fat necrosis, left breast cancer wide excision site.    CHOLECYSTECTOMY     COLONOSCOPY  2014   Dr Tiffany Kocher   COLONOSCOPY WITH PROPOFOL N/A 10/10/2016   Procedure: COLONOSCOPY WITH PROPOFOL;  Surgeon: Manya Silvas, MD;  Location: [facility] ENDOSCOPY;  Service: Endoscopy;  Laterality: N/A;   ELECTROMAGNETIC NAVIGATION BROCHOSCOPY N/A 04/12/2018   Procedure: ELECTROMAGNETIC NAVIGATION BRONCHOSCOPY;  Surgeon: Flora Lipps, MD;  Location: [facility] ORS;  Service: Cardiopulmonary;  Laterality: N/A;   LAPAROSCOPIC BILATERAL SALPINGO OOPHERECTOMY Bilateral 06/27/2018   Procedure: LAPAROSCOPIC BILATERAL SALPINGO OOPHORECTOMY;  Surgeon: Homero Fellers, MD;  Location: [facility] ORS;  Service: Gynecology;  Laterality: Bilateral;   LAPAROSCOPIC GASTRIC SLEEVE RESECTION N/A 12/07/2020   Procedure: LAPAROSCOPIC GASTRIC SLEEVE RESECTION;  Surgeon: Johnathan Hausen, MD;  Location: [facility] ORS;  Service: General;  Laterality: N/A;   ROTATOR CUFF REPAIR Right 1995   UPPER GI ENDOSCOPY N/A 12/07/2020   Procedure: UPPER GI ENDOSCOPY;  Surgeon: Johnathan Hausen, MD;  Location: [facility] ORS;  Service: General;  Laterality: N/A;   VAGINAL HYSTERECTOMY  1978   partial   VULVECTOMY PARTIAL  1995   wide excision debriedment Left 04/10/14    OB History     Gravida  3   Para  2   Term      Preterm      AB  1   Living         SAB  1   IAB      Ectopic      Multiple      Live Births           Obstetric Comments  1st Menstrual Cycle:  12  1st Pregnancy:  16          Home Medications    Prior to Admission medications   Medication Sig Start Date End Date Taking? Authorizing Provider  albuterol (VENTOLIN HFA) 108 (90 Base) MCG/ACT inhaler Inhale into the lungs. 11/16/04 03/30/22 Yes [provider]  amoxicillin-clavulanate (AUGMENTIN) 875-125 MG tablet Take 1 tablet by mouth every 12 (twelve) hours for 7 days. 01/30/22 02/06/22 Yes Margarette Canada, NP  benzonatate (TESSALON) 100 MG capsule Take 2 capsules  (200 mg total) by mouth every 8 (eight) hours. 01/30/22  Yes Margarette Canada, NP  ezetimibe (ZETIA) 10 MG tablet Take 1 tablet (10 mg total) by mouth daily. 04/21/20  Yes Minna Merritts, MD  fluticasone (FLONASE) 50 MCG/ACT nasal spray Place into the nose. 10/08/20 03/25/22 Yes [provider]  ipratropium (ATROVENT) 0.06 % nasal spray Place 2 sprays into both nostrils 4 (four) times daily. 01/30/22  Yes Margarette Canada, NP  metoprolol succinate (TOPROL-XL) 50 MG 24 hr tablet Take 1 tablet (50 mg total) by mouth daily. Take with or immediately  following a meal. 10/29/21 01/30/22 Yes Theora Gianotti, NP  predniSONE (DELTASONE) 20 MG tablet Take 3 tablets (60 mg total) by mouth daily with breakfast for 5 days. 3 tablets daily for 5 days. 01/30/22 02/04/22 Yes Margarette Canada, NP  promethazine-dextromethorphan (PROMETHAZINE-DM) 6.25-15 MG/5ML syrup Take 5 mLs by mouth 4 (four) times daily as needed. 01/30/22  Yes Margarette Canada, NP  ALPRAZolam Duanne Moron) 0.5 MG tablet Take 1 tablet (0.5 mg total) by mouth at bedtime as needed for anxiety. 10/08/20   Mar Daring, PA-C  amLODipine (NORVASC) 5 MG tablet Take 1 tablet (5 mg total) by mouth daily. 12/13/21   Minna Merritts, MD  Multiple Vitamins-Iron (ONE-DAILY MULTI-VITAMIN/IRON PO) Take by mouth. Take by mouth once daily Patient not taking: Reported on 12/13/2021    [provider]    Family History Family History  Problem Relation Age of Onset   COPD Mother    Diabetes Father    Heart failure Sister    Emphysema Sister    Diabetes Sister    Hypertension Sister    Crohn's disease Sister    Hypertension Sister    Hypertension Sister    Cancer Maternal Grandfather 76       ? source   Cancer Paternal Grandfather 36       ? source   Emphysema Brother    Lung cancer Brother    Emphysema Brother    Breast cancer Other        great aunt   Cancer Other        great great paternal aunt, ? age    Social History Social History    Tobacco Use   Smoking status: Former    Packs/day: 1.00    Years: 50.00    Total pack years: 50.00    Types: Cigarettes    Quit date: 09/15/2014    Years since quitting: 7.3   Smokeless tobacco: Never  Vaping Use   Vaping Use: Never used  Substance Use Topics   Alcohol use: Yes    Alcohol/week: 7.0 standard drinks of alcohol    Types: 7 Cans of beer per week    Comment: usually beer, occasionally wine. Daily   Drug use: No     Allergies   Hydrochlorothiazide, Levofloxacin, Other, Gramineae pollens, Levaquin [levofloxacin in d5w], Codeine, Cyanoacrylate, Denture adhesive, and Tape   Review of Systems Review of Systems  Constitutional:  Positive for fever.  HENT:  Positive for congestion, ear pain, rhinorrhea, sinus pressure and sore throat.   Respiratory:  Positive for cough, shortness of breath and wheezing.   Hematological: Negative.   Psychiatric/Behavioral: Negative.       Physical Exam Triage Vital Signs ED Triage Vitals  Enc Vitals Group     BP 01/30/22 1229 (!) 157/66     Pulse Rate 01/30/22 1229 72     Resp 01/30/22 1229 14     Temp 01/30/22 1229 98.6 F (37 C)     Temp Source 01/30/22 1229 Oral     SpO2 01/30/22 1229 97 %     Weight 01/30/22 1226 211 lb (95.7 kg)     Height 01/30/22 1226 5\' 4"  (1.626 m)     Head Circumference --      Peak Flow --      Pain Score 01/30/22 1226 3     Pain Loc --      Pain Edu? --      Excl. in [place]? --  No data found.  Updated Vital Signs BP (!) 157/66 (BP Location: Left Arm)   Pulse 72   Temp 98.6 F (37 C) (Oral)   Resp 14   Ht 5\' 4"  (1.626 m)   Wt 211 lb (95.7 kg)   SpO2 97%   BMI 36.22 kg/m   Visual Acuity Right Eye Distance:   Left Eye Distance:   Bilateral Distance:    Right Eye Near:   Left Eye Near:    Bilateral Near:     Physical Exam Vitals and nursing note reviewed.  Constitutional:      Appearance: Normal appearance. She is not ill-appearing.  HENT:     Head: Normocephalic and  atraumatic.     Right Ear: Tympanic membrane, ear canal and external ear normal. There is no impacted cerumen.     Left Ear: Tympanic membrane, ear canal and external ear normal. There is no impacted cerumen.     Nose: Congestion and rhinorrhea present.     Mouth/Throat:     Mouth: Mucous membranes are moist.     Pharynx: Oropharynx is clear. No posterior oropharyngeal erythema.  Cardiovascular:     Rate and Rhythm: Normal rate and regular rhythm.     Pulses: Normal pulses.     Heart sounds: Normal heart sounds. No murmur heard.    No friction rub. No gallop.  Pulmonary:     Effort: Pulmonary effort is normal.     Breath sounds: Wheezing present. No rhonchi or rales.  Musculoskeletal:     Cervical back: Normal range of motion and neck supple.  Lymphadenopathy:     Cervical: No cervical adenopathy.  Skin:    General: Skin is warm and dry.     Capillary Refill: Capillary refill takes less than 2 seconds.     Findings: No erythema or rash.  Neurological:     General: No focal deficit present.     Mental Status: She is alert and oriented to person, place, and time.  Psychiatric:        Mood and Affect: Mood normal.        Behavior: Behavior normal.        Thought Content: Thought content normal.        Judgment: Judgment normal.      UC Treatments / Results  Labs (all labs ordered are listed, but only abnormal results are displayed) Labs Reviewed  SARS CORONAVIRUS 2 BY RT PCR    EKG   Radiology No results found.  Procedures Procedures (including critical care time)  Medications Ordered in UC Medications - No data to display  Initial Impression / Assessment and Plan / UC Course  I have reviewed the triage vital signs and the nursing notes.  Pertinent labs & imaging results that were available during my care of the patient were reviewed by me and considered in my medical decision making (see chart for details).  Patient is a pleasant, nontoxic-appearing  71 year old female here for evaluation of respiratory complaints as outlined in HPI above.  Her physical exam reveals pearly-gray tympanic membranes bilaterally with normal light reflex and clear external auditory canals.  Nasal mucosa is edematous and pale with scant clear discharge in both nares.  Oropharyngeal exam is benign.  No cervical lymphadenopathy appreciated on exam.  Cardiopulmonary exam reveals S1-S2 heart sounds with regular rate and rhythm lung sounds that have scattered wheezes in bilateral upper lobes.  These clear with a cough.  The remainder of her lung fields are  clear to auscultation.  Given patient's increased shortness of breath and wheezing with her history of COPD I believe she is having a COPD exacerbation.  The nasal congestion I believe is coming from allergies.  I will prescribe Atrovent nasal spray to help with the nasal congestion.  I will cover her empirically with Augmentin twice daily for 10 days as she has an allergy to Levaquin for a COPD exacerbation.  I will also prescribe her prednisone 60 mg daily for 5 days starting tomorrow.  Tessalon Perles and Promethazine DM cough syrup also prescribed for cough and congestion.   Final Clinical Impressions(s) / UC Diagnoses   Final diagnoses:  COPD exacerbation ([place])     Discharge Instructions      Take the Augmentin twice daily for 7 days with food for treatment of your respiratory infection.  Use your albuterol inhaler, 2 puffs every 4-6 hours as needed for shortness breath or wheezing.  Take the prednisone 60 mg daily at breakfast time starting today.  Use the Tessalon Perles every 8 hours as needed for cough during the day.  They will sometimes cause numbness to the base of your tongue or give you metallic taste in her mouth.  This is normal.  You will need to take them with a small sip of water.  Use the Promethazine DM cough syrup at bedtime as needed for cough and congestion.  Use the Atrovent nasal spray, 2  squirt sin each nostril every 6 hours, as needed for nasal congestion.  Return for reevaluation for new or worsening symptoms.      ED Prescriptions     Medication Sig Dispense Auth. Provider   amoxicillin-clavulanate (AUGMENTIN) 875-125 MG tablet Take 1 tablet by mouth every 12 (twelve) hours for 7 days. 14 tablet Margarette Canada, NP   benzonatate (TESSALON) 100 MG capsule Take 2 capsules (200 mg total) by mouth every 8 (eight) hours. 21 capsule Margarette Canada, NP   ipratropium (ATROVENT) 0.06 % nasal spray Place 2 sprays into both nostrils 4 (four) times daily. 15 mL Margarette Canada, NP   predniSONE (DELTASONE) 20 MG tablet Take 3 tablets (60 mg total) by mouth daily with breakfast for 5 days. 3 tablets daily for 5 days. 15 tablet Margarette Canada, NP   promethazine-dextromethorphan (PROMETHAZINE-DM) 6.25-15 MG/5ML syrup Take 5 mLs by mouth 4 (four) times daily as needed. 118 mL Margarette Canada, NP      PDMP not reviewed this encounter.   Margarette Canada, NP 01/30/22 1251

## 2022-01-30 NOTE — ED Triage Notes (Signed)
Patient c/o productive cough and chest congestion, sinus drainage that started on Thursday.  Patient took home covid test on Friday and was negative.  Patient denies fevers.

## 2022-01-30 NOTE — Discharge Instructions (Signed)
Take the Augmentin twice daily for 7 days with food for treatment of your respiratory infection.  Use your albuterol inhaler, 2 puffs every 4-6 hours as needed for shortness breath or wheezing.  Take the prednisone 60 mg daily at breakfast time starting today.  Use the Tessalon Perles every 8 hours as needed for cough during the day.  They will sometimes cause numbness to the base of your tongue or give you metallic taste in her mouth.  This is normal.  You will need to take them with a small sip of water.  Use the Promethazine DM cough syrup at bedtime as needed for cough and congestion.  Use the Atrovent nasal spray, 2 squirt sin each nostril every 6 hours, as needed for nasal congestion.  Return for reevaluation for new or worsening symptoms.

## 2022-02-23 ENCOUNTER — Ambulatory Visit
Admission: RE | Admit: 2022-02-23 | Discharge: 2022-02-23 | Disposition: A | Discharge status: Home / Self Care | Payer: Medicare PPO | Source: Ambulatory Visit | Attending: General Surgery | Admitting: General Surgery

## 2022-02-23 DIAGNOSIS — C50312 Malignant neoplasm of lower-inner quadrant of left female breast: Secondary | ICD-10-CM | POA: Diagnosis not present

## 2022-02-23 DIAGNOSIS — Z1231 Encounter for screening mammogram for malignant neoplasm of breast: Secondary | ICD-10-CM | POA: Diagnosis present

## 2022-03-22 ENCOUNTER — Other Ambulatory Visit: Payer: Self-pay | Admitting: General Surgery

## 2022-03-22 DIAGNOSIS — N6489 Other specified disorders of breast: Secondary | ICD-10-CM

## 2022-03-25 ENCOUNTER — Other Ambulatory Visit: Payer: Self-pay | Admitting: General Surgery

## 2022-03-25 DIAGNOSIS — C50312 Malignant neoplasm of lower-inner quadrant of left female breast: Secondary | ICD-10-CM

## 2022-03-25 LAB — SURGICAL PATHOLOGY

## 2022-04-05 ENCOUNTER — Institutional Professional Consult (permissible substitution): Payer: Medicare PPO | Admitting: Plastic Surgery

## 2022-04-12 ENCOUNTER — Encounter
Admission: RE | Admit: 2022-04-12 | Discharge: 2022-04-12 | Disposition: A | Discharge status: Home / Self Care | Payer: Medicare PPO | Source: Ambulatory Visit | Attending: General Surgery | Admitting: General Surgery

## 2022-04-12 DIAGNOSIS — C50312 Malignant neoplasm of lower-inner quadrant of left female breast: Secondary | ICD-10-CM

## 2022-04-12 LAB — GLUCOSE, CAPILLARY: Glucose-Capillary: 89 mg/dL (ref 70–99)

## 2022-04-12 MED ORDER — FLUDEOXYGLUCOSE F - 18 (FDG) INJECTION
10.9000 | Freq: Once | INTRAVENOUS | Status: AC | PRN
  Administered 2022-04-12: 11.73 via INTRAVENOUS

## 2022-05-23 ENCOUNTER — Ambulatory Visit: Payer: Medicare PPO

## 2022-05-25 ENCOUNTER — Ambulatory Visit: Payer: Medicare PPO | Admitting: Internal Medicine

## 2022-05-25 ENCOUNTER — Other Ambulatory Visit: Payer: Medicare PPO

## 2022-07-28 ENCOUNTER — Other Ambulatory Visit: Payer: Self-pay | Admitting: Unknown Physician Specialty

## 2022-07-28 DIAGNOSIS — R430 Anosmia: Secondary | ICD-10-CM

## 2022-08-23 ENCOUNTER — Ambulatory Visit
Admission: RE | Admit: 2022-08-23 | Discharge: 2022-08-23 | Disposition: A | Discharge status: Home / Self Care | Payer: Medicare PPO | Source: Ambulatory Visit | Attending: Unknown Physician Specialty | Admitting: Unknown Physician Specialty

## 2022-08-23 DIAGNOSIS — R430 Anosmia: Secondary | ICD-10-CM

## 2022-08-23 MED ORDER — GADOBENATE DIMEGLUMINE 529 MG/ML IV SOLN
15.0000 mL | Freq: Once | INTRAVENOUS | Status: AC | PRN
  Administered 2022-08-23: 15 mL via INTRAVENOUS

## 2022-08-24 ENCOUNTER — Other Ambulatory Visit: Payer: Medicare PPO

## 2022-08-25 ENCOUNTER — Other Ambulatory Visit: Payer: Medicare PPO

## 2022-09-15 ENCOUNTER — Telehealth: Payer: Self-pay | Admitting: Cardiovascular Disease

## 2022-09-15 ENCOUNTER — Encounter: Payer: Self-pay | Admitting: Cardiovascular Disease

## 2022-09-15 NOTE — Telephone Encounter (Signed)
Call cannot be completed at this time.

## 2022-09-15 NOTE — Telephone Encounter (Signed)
Pt c/o medication issue:  1. Name of Medication: metoprolol succinate (TOPROL-XL) 50 MG 24 hr tablet (Expired)    2. How are you currently taking this medication (dosage and times per day)? As prescribed   3. Are you having a reaction (difficulty breathing--STAT)? Yes  4. What is your medication issue? She believes the increased dosage of this med is causing her lose taste and smell. Requesting return call.

## 2022-09-15 NOTE — Telephone Encounter (Signed)
Mobile number:  No answer/voicemail box has not been set up.

## 2022-09-15 NOTE — Telephone Encounter (Signed)
Patient states that she has been having some side effects from her metoprolol. Symptoms have been lose of taste, smell, and blurry vision. Reviewed symptoms and that she has been on this medication for a long time with no symptoms until now makes it less likely that it could be from that. She states reading a case in the [employer]. She then went into her medications reporting that she stopped her amlodipine due to swelling and now wants to stop metoprolol. Requested that she not stop until I can check with provider. She was agreeable. Inquired what her blood pressures have been running and she states 180/90's but last week at an appointment it was 129/62. She does report having anxiety and feels that could be causing some of the elevated readings. Advised that I will send this over to the provider for his review and recommendations and we would be in touch. She verbalized understanding with no further questions at this time.

## 2022-09-16 NOTE — Telephone Encounter (Signed)
Call cannot be completed at this time.

## 2022-09-16 NOTE — Telephone Encounter (Signed)
No answer/Voicemail box not set up.

## 2022-09-20 MED ORDER — CARVEDILOL 12.5 MG PO TABS: 12.5000 mg | ORAL_TABLET | Freq: Two times a day (BID) | ORAL | 3 refills | Status: DC

## 2022-09-20 NOTE — Telephone Encounter (Signed)
Patient is returning call.  °

## 2022-09-20 NOTE — Telephone Encounter (Signed)
Spoke with patient and reviewed recommendations from provider. She was agreeable with this plan and we also got her scheduled. She preferred to wait a month to give the medication time to see if it would work. She verbalized understanding of our conversation, instructions provided, and had no further questions.     Minna Merritts, MD  Caller: Unspecified (5 days ago,  1:37 PM) Would recommend she closely monitor her blood pressure without amlodipine We can stop metoprolol but again we will need to substitute amlodipine and metoprolol with a different medication otherwise blood pressure will run high On last clinic visit she reported systolic pressures were in the 150 range, would recommend she watch this closely Would suggest we start carvedilol 12.5 mg twice daily and stop metoprolol Perhaps we can schedule a follow-up in clinic in a few weeks to make sure this does a good job with her blood pressure with no side effects Thx TGollan

## 2022-11-09 NOTE — Progress Notes (Signed)
Cardiology Office Note:    Date:  11/10/2022   ID:  Michelle Pitts, DOB 1950-12-13, MRN [id number]  PCP:  Michelle Pitts, [place] [facility] Cardiologist:  Michelle Rogue, MD     Referring MD: Michelle Abler, PA-C   Chief Complaint  Patient presents with   Other    6 month follow up -- Meds reviewed verbally with patient.     History of Present Illness:    Michelle Pitts is a 72 y.o. female with a hx of hypertension, aortic atherosclerosis, hyperlipidemia (intolerant to statins), OSA did not tolerate CPAP, COPD, left breast cancer in 2015 s/p XRT, lung cancer of left lower lobe S1 NSCLC s/p XRT, GERD, hypothyroidism, previous tobacco use, anxiety, s/p sleeve gastrectomy.  She was initially evaluated by Michelle Pitts in 2016 for evaluation of tachycardia and hypertension.  He had recently completed breast cancer treatment and was in cardiac and pulmonary rehab and noted to be persistently tachycardic.  In November 2019 she established care with Michelle Pitts for shortness of breath, abnormal EKG and preoperative clearance.  Overall, she was doing well at this time and was advised she could follow-up as needed.  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  An echo was ordered Which showed an EF of 55 to 60%, grade 1 DD.   In March 2023 she had episodes of chest tightness, coronary CTA was ordered which revealed a calcium score of 65.9, less than 25% blockage in the LAD and otherwise no significant plaque.  She wore a ZIO monitor which showed predominantly normal sinus rhythm, 1 run of VT lasting 7 bpm, 14 runs of SVT lasting 12 bpm.  Most recently she was evaluated by Michelle Pitts on 12/13/2021, her blood pressure was elevated and her Toprol was increased to 50 mg daily.  She had not been able to tolerate  carvedilol, amlodipine, or HCTZ--which caused angioedema. She contacted our office on 09/15/22 advising the metoprolol was causing changes with her taste, ultimately, Toprol was stopped and carvedilol was restarted at 12.5 mg BID.   She presents today for follow up of her HTN. She was most recently prescribed Coreg, however she stopped this as it made her profoundly fatigued and SOB. She resumed her previously prescribed Toprol 25 mg daily ~ 3 weeks ago, and has tolerated it well without any further disruption in her taste and smell (which was previously an issue). She continues to lose weight following her bypass surgery and is down 80 lbs overall, with goal to lose ~ 40 lbs. She also just finished up chest radiation. She denies chest pain, palpitations, dyspnea, pnd, orthopnea, n, v, dizziness, syncope, edema, weight gain, or early satiety.    Past Medical History:  Diagnosis Date   Anemia    Anxiety    Aortic atherosclerosis ([place])    Breast cancer ([place]) 01/23/2014   Left breast, T1c, N0, M0. ER/PR +; Her 2 neu not overexpressed. Oncotype DX: 13,Low risk.9% over 10 years.    Cancer ([place]) 1995   vulvar, [facility]   COPD (chronic obstructive pulmonary disease) ([facility])    bronchitis   Coronary artery calcification seen on CT scan    Dysrhythmia    GERD (gastroesophageal reflux disease)    History of colon polyps    Hypercholesterolemia    Hypertension    Hypothyroidism  Insomnia    Lung cancer ([place])    Palpitations    a. 07/2015 Holter: RSR, PVCs, 3 beat run of SVT.   Papilloma of breast 01/09/2014   Personal history of radiation therapy    Personal history of tobacco use, presenting hazards to health 11/18/2015   Sleep apnea    NO CPAP   SOB (shortness of breath) on exertion    a. 05/2020 Echo: EF 55-60%, no rwma, GrIDD, nl RV fxn.   Spinal stenosis    Vitamin D deficiency     Past Surgical History:  Procedure Laterality Date   BLADDER SUSPENSION  2001   BREAST BIOPSY Right     BREAST BIOPSY Left    BREAST CYST ASPIRATION Left 1985   BREAST EXCISIONAL BIOPSY Right 1975   x 2   BREAST SURGERY Left 01/23/2014   T1c, N0; Er/ PR positive, Her 2 negative   2 years anti-estrogen RX. Oncotype DX: 13,Low risk.9% over 10 years   BREAST SURGERY Left 04/10/2014   Debridement of fat necrosis, left breast cancer wide excision site.    CHOLECYSTECTOMY     COLONOSCOPY  2014   Dr Michelle Pitts   COLONOSCOPY WITH PROPOFOL N/A 10/10/2016   Procedure: COLONOSCOPY WITH PROPOFOL;  Surgeon: Michelle Silvas, MD;  Location: [facility] ENDOSCOPY;  Service: Endoscopy;  Laterality: N/A;   ELECTROMAGNETIC NAVIGATION BROCHOSCOPY N/A 04/12/2018   Procedure: ELECTROMAGNETIC NAVIGATION BRONCHOSCOPY;  Surgeon: Michelle Lipps, MD;  Location: [facility] ORS;  Service: Cardiopulmonary;  Laterality: N/A;   LAPAROSCOPIC BILATERAL SALPINGO OOPHERECTOMY Bilateral 06/27/2018   Procedure: LAPAROSCOPIC BILATERAL SALPINGO OOPHORECTOMY;  Surgeon: Michelle Fellers, MD;  Location: [facility] ORS;  Service: Gynecology;  Laterality: Bilateral;   LAPAROSCOPIC GASTRIC SLEEVE RESECTION N/A 12/07/2020   Procedure: LAPAROSCOPIC GASTRIC SLEEVE RESECTION;  Surgeon: Michelle Hausen, MD;  Location: [facility] ORS;  Service: General;  Laterality: N/A;   ROTATOR CUFF REPAIR Right 1995   UPPER GI ENDOSCOPY N/A 12/07/2020   Procedure: UPPER GI ENDOSCOPY;  Surgeon: Michelle Hausen, MD;  Location: [facility] ORS;  Service: General;  Laterality: N/A;   VAGINAL HYSTERECTOMY  1978   partial   VULVECTOMY PARTIAL  1995   wide excision debriedment Left 04/10/14    Current Medications: Current Meds  Medication Sig   ALPRAZolam (XANAX) 0.5 MG tablet Take 1 tablet (0.5 mg total) by mouth at bedtime as needed for anxiety.   Bempedoic Acid-Ezetimibe (NEXLIZET) 180-10 MG TABS Take 1 tablet by mouth daily.   ezetimibe (ZETIA) 10 MG tablet Take 1 tablet (10 mg total) by mouth daily.   fluticasone (FLONASE) 50 MCG/ACT nasal spray Place into the nose.   metoprolol succinate  (TOPROL XL) 25 MG 24 hr tablet Take 1 tablet (25 mg total) by mouth daily.     Allergies:   Coreg [carvedilol], Hydrochlorothiazide, Levofloxacin, Other, Gramineae pollens, Levaquin [levofloxacin in d5w], Codeine, Cyanoacrylate, Denture adhesive, and Tape   Social History   Socioeconomic History   Marital status: Divorced    Spouse name: Not on file   Number of children: 2   Years of education: Not on file   Highest education level: Some college, no degree  Occupational History   Occupation: retired  Tobacco Use   Smoking status: Former    Packs/day: 1.00    Years: 50.00    Additional pack years: 0.00    Total pack years: 50.00    Types: Cigarettes    Quit date: 09/15/2014    Years since quitting: 8.1   Smokeless tobacco: Never  Vaping Use   Vaping Use: Never used  Substance and Sexual Activity   Alcohol use: Yes    Alcohol/week: 7.0 standard drinks of alcohol    Types: 7 Cans of beer per week    Comment: usually beer, occasionally wine. Daily   Drug use: No   Sexual activity: Not Currently    Birth control/protection: Surgical  Other Topics Concern   Not on file  Social History Narrative   Not on file   Social Determinants of Health   Financial Resource Strain: Low Risk  (03/02/2020)   Overall Financial Resource Strain (CARDIA)    Difficulty of Paying Living Expenses: Not hard at all  Food Insecurity: No Food Insecurity (03/02/2020)   Hunger Vital Sign    Worried About Running Out of Food in the Last Year: Never true    Ran Out of Food in the Last Year: Never true  Transportation Needs: No Transportation Needs (03/02/2020)   PRAPARE - Hydrologist (Medical): No    Lack of Transportation (Non-Medical): No  Physical Activity: Inactive (03/02/2020)   Exercise Vital Sign    Days of Exercise per Week: 0 days    Minutes of Exercise per Session: 0 min  Stress: No Stress Concern Present (03/02/2020)   [place]    Feeling of Stress : Not at all  Social Connections: Socially Isolated (03/02/2020)   Social Connection and Isolation Panel [NHANES]    Frequency of Communication with Friends and Family: Twice a week    Frequency of Social Gatherings with Friends and Family: More than three times a week    Attends Religious Services: Never    Marine scientist or Organizations: No    Attends Music therapist: Never    Marital Status: Divorced     Family History: The patient's family history includes Breast cancer in an other family member; COPD in her mother; Cancer in an other family member; Cancer (age of onset: 34) in her maternal grandfather; Cancer (age of onset: 10) in her paternal grandfather; Crohn's disease in her sister; Diabetes in her father and sister; Emphysema in her brother, brother, and sister; Heart failure in her sister; Hypertension in her sister, sister, and sister; Lung cancer in her brother.  ROS:   Please see the history of present illness.    All other systems reviewed and are negative.  EKGs/Labs/Other Studies Reviewed:    The following studies were reviewed today:  11/04/2021 coronary CTA - calcified plaque less than 25% in her LAD, calcium score was 65.9 placing her in the 61st percentile for age and sex matched controls. 4. CAD-RADS 1. Minimal non-obstructive CAD (0-24%). Consider non-atherosclerotic causes of chest pain. Consider preventive therapy and risk factor modification.   05/15/2020 echo complete -EF 55 to 60%, no RWMA, grade 1 DD, mild dilatation of her left atria.  EKG:  EKG is ordered today.  The ekg ordered today demonstrates SR, HR 81 bpm, consistent with prior EKG tracings.    Recent Labs: No results found for requested labs within last 365 days.  Recent Lipid Panel    Component Value Date/Time   CHOL 162 12/22/2020 1124   TRIG 155 (H) 12/22/2020 1124   HDL 47 12/22/2020 1124   CHOLHDL 3.4 12/22/2020  1124   CHOLHDL 2.9 05/21/2020 1403   VLDL 19 05/21/2020 1403   LDLCALC 88 12/22/2020 1124     Risk Assessment/Calculations:  Physical Exam:    VS:  BP 128/80 (BP Location: Left Arm, Patient Position: Sitting, Cuff Size: Normal)   Pulse 81   Ht 5' 4.5" (1.638 m)   Wt 189 lb (85.7 kg)   SpO2 97%   BMI 31.94 kg/m     Wt Readings from Last 3 Encounters:  11/10/22 189 lb (85.7 kg)  01/30/22 211 lb (95.7 kg)  12/13/21 213 lb 8 oz (96.8 kg)     GEN:  Well nourished, well developed in no acute distress HEENT: Normal NECK: No JVD; No carotid bruits LYMPHATICS: No lymphadenopathy CARDIAC: RRR, no murmurs, rubs, gallops RESPIRATORY:  Clear to auscultation without rales, wheezing or rhonchi  ABDOMEN: Soft, non-tender, non-distended MUSCULOSKELETAL:  No edema; No deformity  SKIN: Warm and dry NEUROLOGIC:  Alert and oriented x 3 PSYCHIATRIC:  Normal affect   ASSESSMENT:    1. Essential hypertension   2. Coronary artery calcification seen on CT scan   3. Shortness of breath   4. Mixed hyperlipidemia    PLAN:    In order of problems listed above:  Hypertension - BP today is controlled 128/80, continue Toprol 25 mg daily (Intolerant of amlodipine, HCTZ, metoprolol).  Coronary artery calcification -coronary CTA on 11/04/2021 showed calcified plaque less than 25% in her LAD, calcium score was 65.9. ASCVD 18%, placing her in the intermediate risk range. Discussed adding ASA 81 mg daily for prevention. She would like to think about this as she was previously told it was no longer needed. Stable with no anginal symptoms. No indication for ischemic evaluation.    Mixed hyperlipidemia -LDL on 10/26/2022 was elevated at 107, she has been intolerant of statins.  We discussed indications to have her LDL be < 70. Will send in Nexlizet 180-10 mg daily, however she is concerned with cost. If it is not cost prohibitive, she will stop her Zetia and start Nexlizet as outlined  above and repeat FLP and LFT in 8 weeks. We also discussed referral to PharmD for PCSK9i, but she is not interested in any injectables at this time.   Disposition - Start Nexlizet 180-10 mg daily; however if she cannot afford she will continue Zetia and work on lifestyle modifications.            Medication Adjustments/Labs and Tests Ordered: Current medicines are reviewed at length with the patient today.  Concerns regarding medicines are outlined above.  Orders Placed This Encounter  Procedures   Hepatic function panel   Lipid panel   EKG 12-Lead   Meds ordered this encounter  Medications   metoprolol succinate (TOPROL XL) 25 MG 24 hr tablet    Sig: Take 1 tablet (25 mg total) by mouth daily.    Dispense:  90 tablet    Refill:  3   Bempedoic Acid-Ezetimibe (NEXLIZET) 180-10 MG TABS    Sig: Take 1 tablet by mouth daily.    Dispense:  90 tablet    Refill:  3    Patient Instructions  Medication Instructions:  Your physician has recommended you make the following change in your medication:   START - Bempedoic Acid-Ezetimibe (NEXLIZET) 180-10 MG TABS 1 tablet, Daily START - metoprolol succinate (TOPROL XL) 25 MG 24 hr tablet, Daily  *If you need a refill on your cardiac medications before your next appointment, please call your pharmacy*  Lab Work: Your physician recommends that you return for lab work in: 8 weeks for LFT & fasting lipid Interior and spatial designer at [facility] 1st  desk on the right to check in (REGISTRATION)  Lab hours: Monday- Friday (7:30 am- 5:30 pm)  If you have labs (blood work) drawn today and your tests are completely normal, you will receive your results only by: [place] (if you have MyChart) OR A paper copy in the mail If you have any lab test that is abnormal or we need to change your treatment, we will call you to review the results.  Testing/Procedures: -None ordered  Follow-Up: At [facility], you and your health needs are  our priority.  As part of our continuing mission to provide you with exceptional heart care, we have created designated Provider Care Teams.  These Care Teams include your primary Cardiologist (physician) and Advanced Practice Providers (APPs -  Physician Assistants and Nurse Practitioners) who all work together to provide you with the care you need, when you need it.  We recommend signing up for the patient portal called "MyChart".  Sign up information is provided on this After Visit Summary.  MyChart is used to connect with patients for Virtual Visits (Telemedicine).  Patients are able to view lab/test results, encounter notes, upcoming appointments, etc.  Non-urgent messages can be sent to your provider as well.   To learn more about what you can do with MyChart, go to [email or web address].    Your next appointment:   1 year(s)  Provider:   You may see Michelle Rogue, MD or one of the following Advanced Practice Providers on your designated Care Team:   Murray Hodgkins, NP Christell Faith, PA-C Cadence Kathlen Mody, PA-C Gerrie Nordmann, NP    Other Instructions -None    Signed, Trudi Ida, NP  11/10/2022 3:21 PM    [place]

## 2022-11-10 ENCOUNTER — Ambulatory Visit: Payer: Medicare PPO | Attending: Nurse Practitioner | Admitting: Cardiology

## 2022-11-10 ENCOUNTER — Encounter: Payer: Self-pay | Admitting: Cardiology

## 2022-11-10 VITALS — BP 128/80 | HR 81 | Ht 64.5 in | Wt 189.0 lb

## 2022-11-10 DIAGNOSIS — E782 Mixed hyperlipidemia: Secondary | ICD-10-CM | POA: Diagnosis not present

## 2022-11-10 DIAGNOSIS — I2510 Atherosclerotic heart disease of native coronary artery without angina pectoris: Secondary | ICD-10-CM | POA: Diagnosis not present

## 2022-11-10 DIAGNOSIS — I10 Essential (primary) hypertension: Secondary | ICD-10-CM | POA: Diagnosis not present

## 2022-11-10 DIAGNOSIS — R0602 Shortness of breath: Secondary | ICD-10-CM | POA: Diagnosis not present

## 2022-11-10 MED ORDER — NEXLIZET 180-10 MG PO TABS: 1.0000 | ORAL_TABLET | Freq: Every day | ORAL | 3 refills | Status: DC

## 2022-11-10 MED ORDER — METOPROLOL SUCCINATE ER 25 MG PO TB24: 25.0000 mg | ORAL_TABLET | Freq: Every day | ORAL | 3 refills | Status: DC

## 2022-11-10 NOTE — Patient Instructions (Signed)
Medication Instructions:  Your physician has recommended you make the following change in your medication:   START - Bempedoic Acid-Ezetimibe (NEXLIZET) 180-10 MG TABS 1 tablet, Daily START - metoprolol succinate (TOPROL XL) 25 MG 24 hr tablet, Daily  *If you need a refill on your cardiac medications before your next appointment, please call your pharmacy*  Lab Work: Your physician recommends that you return for lab work in: 8 weeks for LFT & fasting lipid [place] Entrance at [facility] 1st desk on the right to check in (REGISTRATION)  Lab hours: Monday- Friday (7:30 am- 5:30 pm)  If you have labs (blood work) drawn today and your tests are completely normal, you will receive your results only by: MyChart Message (if you have MyChart) OR A paper copy in the mail If you have any lab test that is abnormal or we need to change your treatment, we will call you to review the results.  Testing/Procedures: -None ordered  Follow-Up: At [facility], you and your health needs are our priority.  As part of our continuing mission to provide you with exceptional heart care, we have created designated Provider Care Teams.  These Care Teams include your primary Cardiologist (physician) and Advanced Practice Providers (APPs -  Physician Assistants and Nurse Practitioners) who all work together to provide you with the care you need, when you need it.  We recommend signing up for the patient portal called "MyChart".  Sign up information is provided on this After Visit Summary.  MyChart is used to connect with patients for Virtual Visits (Telemedicine).  Patients are able to view lab/test results, encounter notes, upcoming appointments, etc.  Non-urgent messages can be sent to your provider as well.   To learn more about what you can do with MyChart, go to [email or web address].    Your next appointment:   1 year(s)  Provider:   You may see Ida Rogue, MD or one of the following  Advanced Practice Providers on your designated Care Team:   Murray Hodgkins, NP Christell Faith, PA-C Cadence Kathlen Mody, PA-C Gerrie Nordmann, NP    Other Instructions -None

## 2022-11-15 ENCOUNTER — Other Ambulatory Visit (HOSPITAL_COMMUNITY): Payer: Self-pay

## 2022-11-15 ENCOUNTER — Telehealth: Payer: Self-pay

## 2022-11-15 ENCOUNTER — Telehealth: Payer: Self-pay | Admitting: Cardiovascular Disease

## 2022-11-15 NOTE — Telephone Encounter (Signed)
Spoke with patient and advised Prior Authorization was approved through the end of this year. She verbalized understanding with no further questions at this time.

## 2022-11-15 NOTE — Telephone Encounter (Signed)
Pharmacy Patient Advocate Encounter  Prior Authorization for NEXLIZET has been approved.    PA# [id number] Effective dates: 08/15/22 through 08/15/23   Received notification from [facility] that prior authorization for NEXLIZET is needed.    PA submitted on 11/15/22 Key [id number] Status is pending  Karie Soda, [place] Patient Advocate Specialist Direct Number: [phone] Fax: [phone]

## 2022-11-15 NOTE — Telephone Encounter (Signed)
Pt c/o medication issue:  1. Name of Medication:  Bempedoic Acid-Ezetimibe (NEXLIZET) 180-10 MG TABS  2. How are you currently taking this medication (dosage and times per day)?   3. Are you having a reaction (difficulty breathing--STAT)?   4. What is your medication issue?   Patient states her insurance is requesting a PA for this medication. Patient would like a call back to confirm.

## 2023-01-04 ENCOUNTER — Other Ambulatory Visit
Admission: RE | Admit: 2023-01-04 | Discharge: 2023-01-04 | Disposition: A | Discharge status: Home / Self Care | Payer: Medicare PPO | Attending: Cardiology | Admitting: Cardiology

## 2023-01-04 DIAGNOSIS — I2510 Atherosclerotic heart disease of native coronary artery without angina pectoris: Secondary | ICD-10-CM | POA: Insufficient documentation

## 2023-01-04 DIAGNOSIS — E782 Mixed hyperlipidemia: Secondary | ICD-10-CM | POA: Diagnosis present

## 2023-01-04 DIAGNOSIS — I10 Essential (primary) hypertension: Secondary | ICD-10-CM | POA: Diagnosis present

## 2023-01-04 LAB — LIPID PANEL
Cholesterol: 202 mg/dL — ABNORMAL HIGH (ref 0–200)
HDL: 91 mg/dL
LDL Cholesterol: 92 mg/dL (ref 0–99)
Total CHOL/HDL Ratio: 2.2 ratio
Triglycerides: 97 mg/dL
VLDL: 19 mg/dL (ref 0–40)

## 2023-01-04 LAB — HEPATIC FUNCTION PANEL
ALT: 19 U/L (ref 0–44)
AST: 25 U/L (ref 15–41)
Albumin: 4.3 g/dL (ref 3.5–5.0)
Alkaline Phosphatase: 81 U/L (ref 38–126)
Bilirubin, Direct: 0.1 mg/dL (ref 0.0–0.2)
Indirect Bilirubin: 0.9 mg/dL (ref 0.3–0.9)
Total Bilirubin: 1 mg/dL (ref 0.3–1.2)
Total Protein: 7.4 g/dL (ref 6.5–8.1)

## 2023-03-14 ENCOUNTER — Other Ambulatory Visit: Payer: Self-pay | Admitting: Orthopedic Surgery

## 2023-03-14 DIAGNOSIS — M4807 Spinal stenosis, lumbosacral region: Secondary | ICD-10-CM

## 2023-03-14 DIAGNOSIS — M5416 Radiculopathy, lumbar region: Secondary | ICD-10-CM

## 2023-03-14 DIAGNOSIS — M5136 Other intervertebral disc degeneration, lumbar region: Secondary | ICD-10-CM

## 2023-03-22 ENCOUNTER — Encounter: Payer: Self-pay | Admitting: Orthopedic Surgery

## 2023-03-25 ENCOUNTER — Ambulatory Visit: Admission: RE | Admit: 2023-03-25 | Payer: Medicare PPO | Source: Ambulatory Visit

## 2023-03-25 DIAGNOSIS — M4807 Spinal stenosis, lumbosacral region: Secondary | ICD-10-CM

## 2023-03-25 DIAGNOSIS — M5416 Radiculopathy, lumbar region: Secondary | ICD-10-CM

## 2023-03-25 DIAGNOSIS — M5136 Other intervertebral disc degeneration, lumbar region: Secondary | ICD-10-CM

## 2023-06-27 ENCOUNTER — Telehealth: Payer: Self-pay

## 2023-06-27 NOTE — Telephone Encounter (Signed)
Copied from CRM [id number]. Topic: General - Inquiry >> Jun 27, 2023 11:16 AM Patsy Lager T wrote: Reason for CRM: patient called stated she need proof of at least 2 visits per year for the year of 2013, 2014, 2018 and 2020. She was a patient of [facility] and then [facility]. Please f/u with patient

## 2023-07-27 ENCOUNTER — Other Ambulatory Visit (HOSPITAL_COMMUNITY): Payer: Self-pay

## 2023-07-27 ENCOUNTER — Telehealth: Payer: Self-pay | Admitting: Pharmacy Technician

## 2023-07-27 NOTE — Telephone Encounter (Signed)
Pharmacy Patient Advocate Encounter   Received notification from CoverMyMeds that prior authorization for nexlizet is required/requested.   Insurance verification completed.   The patient is insured through [place] .   Per test claim: Refill too soon. PA is not needed at this time. Medication was filled 06/06/23. Next eligible fill date is 08/20/23.   Prior auth on file  05/16/23- 08/14/24

## 2023-07-29 ENCOUNTER — Ambulatory Visit
Admission: RE | Admit: 2023-07-29 | Discharge: 2023-07-29 | Disposition: A | Discharge status: Home / Self Care | Payer: Medicare PPO | Source: Ambulatory Visit | Attending: Family Medicine | Admitting: Family Medicine

## 2023-07-29 VITALS — BP 154/84 | HR 77 | Temp 98.1°F | Resp 17

## 2023-07-29 DIAGNOSIS — S51801A Unspecified open wound of right forearm, initial encounter: Secondary | ICD-10-CM

## 2023-07-29 MED ORDER — CEPHALEXIN 500 MG PO CAPS: 500.0000 mg | ORAL_CAPSULE | Freq: Three times a day (TID) | ORAL | 0 refills | Status: DC

## 2023-07-29 NOTE — ED Provider Notes (Signed)
[facility]    CSN: [id number] Arrival date & time: 07/29/23  1234      History   Chief Complaint Chief Complaint  Patient presents with   Abrasion    cut my arm and I dressed it but it needs to be looked at and dressed better - Entered by patient    HPI Michelle Pitts is a 72 y.o. female.   HPI  Michelle Pitts presents for right forearm injury that occurred 2 days ago after trying to retrieve some money from the dashboard in her car.  She scraped the skin off her arm.  She cleaned the wound and dressed it but believes that she needs to have it evaluated again as she thinks she might have an infection.  She takes aspirin but no other blood thinners.   Michelle Pitts has otherwise been well and has no other concerns.    Past Medical History:  Diagnosis Date   Anemia    Anxiety    Aortic atherosclerosis (HCC)    Breast cancer (HCC) 01/23/2014   Left breast, T1c, N0, M0. ER/PR +; Her 2 neu not overexpressed. Oncotype DX: 13,Low risk.9% over 10 years.    Cancer (HCC) 1995   vulvar, [facility]   COPD (chronic obstructive pulmonary disease) (HCC)    bronchitis   Coronary artery calcification seen on CT scan    Dysrhythmia    GERD (gastroesophageal reflux disease)    History of colon polyps    Hypercholesterolemia    Hypertension    Hypothyroidism    Insomnia    Lung cancer (HCC)    Palpitations    a. 07/2015 Holter: RSR, PVCs, 3 beat run of SVT.   Papilloma of breast 01/09/2014   Personal history of radiation therapy    Personal history of tobacco use, presenting hazards to health 11/18/2015   Sleep apnea    NO CPAP   SOB (shortness of breath) on exertion    a. 05/2020 Echo: EF 55-60%, no rwma, GrIDD, nl RV fxn.   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   Primary cancer of left lower lobe of lung (HCC) 11/25/2020   S/P bilateral oophorectomy 06/27/2018    Aortic atherosclerosis (HCC) 06/21/2018   Malignant neoplasm of lung (HCC) 06/21/2018   Abnormal EKG 06/21/2018   Coronary artery calcification seen on CT scan 06/21/2018   Pneumothorax on left 04/18/2018   Pneumothorax 04/13/2018   Pneumothorax after biopsy 04/12/2018   Bilateral leg numbness 11/10/2017   Obesity, Class III, BMI 40-49.9 (morbid obesity) (HCC) 11/10/2017   DNR no code (do not resuscitate) 02/08/2016   Personal history of tobacco use, presenting hazards to health 11/18/2015   Shortness of breath 07/24/2015   Malignant neoplasm of lower-inner quadrant of left female breast (HCC) 07/22/2015   Tachycardia 07/22/2015   Anxiety 05/15/2015   Colon polyp 05/15/2015   CAFL (chronic airflow limitation) (HCC) 05/15/2015   Elevated blood sugar 05/15/2015   Abnormal liver enzymes 05/15/2015   Family history of diabetes mellitus 05/15/2015   BP (high blood pressure) 05/15/2015   Cannot sleep 05/15/2015   Apnea, sleep 05/15/2015   Avitaminosis D 05/15/2015   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   Anxiety disorder 05/15/2015   Chronic obstructive pulmonary disease (HCC) 05/15/2015   Gastro-esophageal reflux disease without esophagitis 05/15/2015   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 Oncotype DX: 13,Low risk.9% over 10 years   BREAST SURGERY Left 04/10/2014   Debridement of fat necrosis, left breast cancer wide excision site.    CHOLECYSTECTOMY     COLONOSCOPY  2014   Dr Markham Jordan   COLONOSCOPY WITH PROPOFOL N/A 10/10/2016   Procedure: COLONOSCOPY  WITH PROPOFOL;  Surgeon: Scot Jun, MD;  Location: [facility] ENDOSCOPY;  Service: Endoscopy;  Laterality: N/A;   ELECTROMAGNETIC NAVIGATION BROCHOSCOPY N/A 04/12/2018   Procedure: ELECTROMAGNETIC NAVIGATION BRONCHOSCOPY;  Surgeon: Erin Fulling, MD;  Location: [facility] ORS;  Service: Cardiopulmonary;  Laterality: N/A;   LAPAROSCOPIC BILATERAL SALPINGO OOPHERECTOMY Bilateral 06/27/2018   Procedure: LAPAROSCOPIC BILATERAL SALPINGO OOPHORECTOMY;  Surgeon: Natale Milch, MD;  Location: [facility] ORS;  Service: Gynecology;  Laterality: Bilateral;   LAPAROSCOPIC GASTRIC SLEEVE RESECTION N/A 12/07/2020   Procedure: LAPAROSCOPIC GASTRIC SLEEVE RESECTION;  Surgeon: Luretha Murphy, MD;  Location: [facility] ORS;  Service: General;  Laterality: N/A;   ROTATOR CUFF REPAIR Right 1995   UPPER GI ENDOSCOPY N/A 12/07/2020   Procedure: UPPER GI ENDOSCOPY;  Surgeon: Luretha Murphy, MD;  Location: [facility] ORS;  Service: General;  Laterality: N/A;   VAGINAL HYSTERECTOMY  1978   partial   VULVECTOMY PARTIAL  1995   wide excision debriedment Left 04/10/14    OB History     Gravida  3   Para  2   Term      Preterm      AB  1   Living         SAB  1   IAB      Ectopic      Multiple      Live Births           Obstetric Comments  1st Menstrual Cycle:  12  1st Pregnancy:  16          Home Medications    Prior to Admission medications   Medication Sig Start Date End Date Taking? Authorizing Provider  aspirin EC 81 MG tablet Take by mouth.   Yes [provider]  Bempedoic Acid-Ezetimibe (NEXLIZET) 180-10 MG TABS Take 1 tablet by mouth daily. 11/10/22  Yes Flossie Dibble, NP  cephALEXin (KEFLEX) 500 MG capsule Take 1 capsule (500 mg total) by mouth 3 (three) times daily. 07/29/23  Yes Graviel Payeur, DO  metoprolol succinate (TOPROL XL) 25 MG 24 hr tablet Take 1 tablet (25 mg total) by mouth daily. 11/10/22  Yes Flossie Dibble, NP  omeprazole (PRILOSEC) 20 MG capsule Take by mouth.    Yes [provider]  ALPRAZolam Prudy Feeler) 0.5 MG tablet Take 1 tablet (0.5 mg total) by mouth at bedtime as needed for anxiety. 10/08/20   Margaretann Loveless, PA-C  ezetimibe (ZETIA) 10 MG tablet Take 1 tablet (10 mg total) by mouth daily. 04/21/20   Antonieta Iba, MD  fluticasone (FLONASE) 50 MCG/ACT nasal spray Place into the nose. 10/08/20 11/10/22  [provider]    Family History Family History  Problem Relation Age of  Onset   COPD Mother    Diabetes Father    Heart failure Sister    Emphysema Sister    Diabetes Sister    Hypertension Sister    Crohn's disease Sister    Hypertension Sister    Hypertension Sister    Cancer Maternal Grandfather 19       ? source   Cancer Paternal Grandfather 70       ? source   Emphysema Brother    Lung cancer Brother    Emphysema Brother    Breast cancer Other        great aunt   Cancer Other        great great paternal aunt, ? age    Social History Social History   Tobacco Use   Smoking status: Former    Current packs/day: 0.00    Average packs/day: 1 pack/day for 50.0 years (50.0 ttl pk-yrs)    Types: Cigarettes    Start date: 09/15/1964    Quit date: 09/15/2014    Years since quitting: 8.8   Smokeless tobacco: Never  Vaping Use   Vaping status: Never Used  Substance Use Topics   Alcohol use: Yes    Alcohol/week: 7.0 standard drinks of alcohol    Types: 7 Cans of beer per week    Comment: usually beer, occasionally wine. Daily   Drug use: No     Allergies   Coreg [carvedilol], Hydrochlorothiazide, Levofloxacin, Other, Anastrozole, Gramineae pollens, Levaquin [levofloxacin in d5w], Codeine, Cyanoacrylate, Denture adhesive, and Tape   Review of Systems Review of Systems :negative unless otherwise stated in HPI.      Physical Exam Triage Vital Signs ED Triage Vitals [07/29/23 1248]  Encounter Vitals Group     BP      Systolic BP Percentile      Diastolic BP Percentile      Pulse      Resp       Temp      Temp src      SpO2      Weight      Height      Head Circumference      Peak Flow      Pain Score 0     Pain Loc      Pain Education      Exclude from Growth Chart    No data found.  Updated Vital Signs BP (!) 154/84 (BP Location: Right Arm)   Pulse 77   Temp 98.1 F (36.7 C) (Oral)   Resp 17   SpO2 97%   Visual Acuity Right Eye Distance:   Left Eye Distance:   Bilateral Distance:    Right Eye Near:   Left Eye Near:    Bilateral Near:     Physical Exam  GEN: alert, well appearing female, in no acute distress  CV: regular rate, brisk cap refill, strong radial pulse  RESP: no increased work of breathing MSK: no extremity edema, good ROM of right elbow and wrist  SKIN: warm and dry; right forearm 2 cm skin avulsion with surround erythema and dried blood.     UC Treatments / Results  Labs (all labs ordered are listed, but only abnormal results are displayed) Labs Reviewed - No data to display  EKG   Radiology No results found.  Procedures Procedures (including critical care time)  Medications Ordered in UC Medications - No data to display  Initial Impression / Assessment and Plan / UC Course  I have reviewed the triage vital signs and the nursing notes.  Pertinent labs & imaging results that were available during my care of the patient were reviewed by me and considered in my medical decision making (see chart for details).     Patient is a 71 y.o. femalewho presents for right forearm wound that occurred 2 days ago.  Overall, patient is well-appearing and well-hydrated.  Vital signs stable.  Temprance is afebrile.  Exam concerning for skin avulsion with developing cellulitis.  Treat with antibiotics as below. Wound cleaned and dressed with wet to dry dressing.    Reviewed expectations regarding course of current medical issues.  All questions asked were answered.  Outlined signs and symptoms indicating need for more acute intervention. Patient  verbalized understanding. After Visit Summary given.   Final Clinical Impressions(s) / UC Diagnoses   Final diagnoses:  Avulsion of skin of right forearm, initial encounter     Discharge Instructions      See wound care instructions attached.  Stop by the pharmacy to pick up your prescriptions.  Follow up with your primary care provider or return to urgent care for any wound concerns.          ED Prescriptions     Medication Sig Dispense Auth. Provider   cephALEXin (KEFLEX) 500 MG capsule Take 1 capsule (500 mg total) by mouth 3 (three) times daily. 21 capsule Katha Cabal, DO      PDMP not reviewed this encounter.              Katha Cabal, DO 07/29/23 1527

## 2023-07-29 NOTE — Discharge Instructions (Signed)
See wound care instructions attached.  Stop by the pharmacy to pick up your prescriptions.  Follow up with your primary care provider or return to urgent care for any wound concerns.

## 2023-07-29 NOTE — ED Triage Notes (Signed)
Patient cut right forearm 2 days ago. Went to walk in clinic but wasn't seen due to long wait time.

## 2023-12-05 ENCOUNTER — Other Ambulatory Visit: Payer: Self-pay

## 2023-12-05 MED ORDER — METOPROLOL SUCCINATE ER 25 MG PO TB24: 25.0000 mg | ORAL_TABLET | Freq: Every day | ORAL | 0 refills | Status: DC

## 2023-12-13 ENCOUNTER — Encounter: Payer: Self-pay | Admitting: Nurse Practitioner

## 2023-12-13 ENCOUNTER — Ambulatory Visit: Attending: Nurse Practitioner | Admitting: Nurse Practitioner

## 2023-12-13 VITALS — BP 146/70 | HR 78 | Ht 64.5 in | Wt 179.8 lb

## 2023-12-13 DIAGNOSIS — I2510 Atherosclerotic heart disease of native coronary artery without angina pectoris: Secondary | ICD-10-CM | POA: Diagnosis not present

## 2023-12-13 DIAGNOSIS — E782 Mixed hyperlipidemia: Secondary | ICD-10-CM

## 2023-12-13 DIAGNOSIS — I10 Essential (primary) hypertension: Secondary | ICD-10-CM | POA: Diagnosis not present

## 2023-12-13 MED ORDER — METOPROLOL SUCCINATE ER 25 MG PO TB24: 25.0000 mg | ORAL_TABLET | Freq: Every day | ORAL | 3 refills | Status: AC

## 2023-12-13 NOTE — Addendum Note (Signed)
 Addended by: Lorence Rolls on: 12/13/2023 05:03 PM   Modules accepted: Orders

## 2023-12-13 NOTE — Progress Notes (Addendum)
 Office Visit    Patient Name: Michelle Pitts Date of Encounter: 12/13/2023  Primary Care Provider:  Bounvilay, Celena, PA-C Primary Cardiologist:  Belva Boyden, MD  Chief Complaint    73 y.o. female with a history of tobacco abuse, lung cancer, COPD, obesity, aortic atherosclerosis, nonobstructive CAD, hypertension, hyperlipidemia, sleep apnea, and GERD, who presents for hypertension follow-up.  Past Medical History  Subjective   Past Medical History:  Diagnosis Date   Anemia    Anxiety    Aortic atherosclerosis (HCC)    Breast cancer (HCC) 01/23/2014   Left breast, T1c, N0, M0. ER/PR +; Her 2 neu not overexpressed. Oncotype DX: 13,Low risk.9% over 10 years.    Cancer (HCC) 1995   vulvar, [facility]   COPD (chronic obstructive pulmonary disease) (HCC)    bronchitis   Coronary artery calcification seen on CT scan    a. 10/2021 Cor CTA: Ca2+ = 65.9. LAD 0-24% prox, otw nl cors.   Dysrhythmia    GERD (gastroesophageal reflux disease)    History of colon polyps    Hypercholesterolemia    Hypertension    Hypothyroidism    Insomnia    Lung cancer (HCC)    Palpitations    a. 07/2015 Holter: RSR, PVCs, 3 beat run of SVT.   Papilloma of breast 01/09/2014   Personal history of radiation therapy    Personal history of tobacco use, presenting hazards to health 11/18/2015   PSVT (paroxysmal supraventricular tachycardia) (HCC)    a. 10/2021 Zio: Predominantly sinus rhythm, average 79 (54-179).  1 run NSVT x 7 beats at 154.  14 runs SVT (fastest 179 x 12, longest 20 at 128).  No triggered events.   Sleep apnea    NO CPAP   SOB (shortness of breath) on exertion    a. 05/2020 Echo: EF 55-60%, no rwma, GrIDD, nl RV fxn.   Spinal stenosis    Vitamin D  deficiency    Past Surgical History:  Procedure Laterality Date   BLADDER SUSPENSION  2001   BREAST BIOPSY Right    BREAST BIOPSY Left    BREAST CYST ASPIRATION Left 1985   BREAST EXCISIONAL BIOPSY Right 1975   x 2   BREAST  SURGERY Left 01/23/2014   T1c, N0; Er/ PR positive, Her 2 negative   2 years anti-estrogen RX. Oncotype DX: 13,Low risk.9% over 10 years   BREAST SURGERY Left 04/10/2014   Debridement of fat necrosis, left breast cancer wide excision site.    CHOLECYSTECTOMY     COLONOSCOPY  2014   Dr Dawne Euler   COLONOSCOPY WITH PROPOFOL  N/A 10/10/2016   Procedure: COLONOSCOPY WITH PROPOFOL ;  Surgeon: Cassie Click, MD;  Location: [facility] ENDOSCOPY;  Service: Endoscopy;  Laterality: N/A;   ELECTROMAGNETIC NAVIGATION BROCHOSCOPY N/A 04/12/2018   Procedure: ELECTROMAGNETIC NAVIGATION BRONCHOSCOPY;  Surgeon: Cleve Dale, MD;  Location: [facility] ORS;  Service: Cardiopulmonary;  Laterality: N/A;   LAPAROSCOPIC BILATERAL SALPINGO OOPHERECTOMY Bilateral 06/27/2018   Procedure: LAPAROSCOPIC BILATERAL SALPINGO OOPHORECTOMY;  Surgeon: Heron Lord, MD;  Location: [facility] ORS;  Service: Gynecology;  Laterality: Bilateral;   LAPAROSCOPIC GASTRIC SLEEVE RESECTION N/A 12/07/2020   Procedure: LAPAROSCOPIC GASTRIC SLEEVE RESECTION;  Surgeon: Jacolyn Matar, MD;  Location: [facility] ORS;  Service: General;  Laterality: N/A;   ROTATOR CUFF REPAIR Right 1995   UPPER GI ENDOSCOPY N/A 12/07/2020   Procedure: UPPER GI ENDOSCOPY;  Surgeon: Jacolyn Matar, MD;  Location: [facility] ORS;  Service: General;  Laterality: N/A;   VAGINAL HYSTERECTOMY  1978   partial   VULVECTOMY PARTIAL  1995   wide excision debriedment Left 04/10/14    Allergies  Allergies  Allergen Reactions   Coreg  [Carvedilol ] Shortness Of Breath    Fatigue and SOB   Hydrochlorothiazide Swelling and Other (See Comments)    Swelling of tongue. Tongue swelling  Swelling of tongue.   Levofloxacin  Hives and Swelling    swelling  swelling   Other Hives and Itching   Amlodipine      Swelling    Anastrozole Other (See Comments)   Gramineae Pollens Other (See Comments)    sinus sinus sinus   Levaquin  [Levofloxacin  In D5w] Hives, Swelling and Other (See Comments)     Leg swelling    Statins     Myalgias/joint pain   Codeine Nausea And Vomiting    Violent vomiting   Cyanoacrylate Dermatitis    Dermabond Hives and itching  Dermabond   Tape Itching and Rash      History of Present Illness      73 y.o. y/o female with above past medical history including tobacco abuse, lung cancer, COPD, obesity, aortic atherosclerosis, coronary calcifications, hypertension, hyperlipidemia, sleep apnea, and GERD. She was previously evaluated by Dr. Gollan in November 2019 in the setting of abnormal EKG and preoperative evaluation prior to laparoscopic bilateral salpingo-oophorectomy. EKG abnormalities were felt to be secondary to lead placement. Prior CTs were reviewed and showed minimal aortic atherosclerosis and coronary calcifications in the LAD. She was later seen in September 2021, in the setting of dyspnea on exertion. Stress testing was offered however, patient declined. An echocardiogram was performed and showed an EF of 55 to 60% with grade 1 diastolic dysfunction, and normal RV function.   In the spring 2023, she was seen due to palpitations and chest tightness.  Coronary CTA showed minimal, nonobstructive LAD disease with a calcium score of 65.9.  Event monitoring showed 14 brief runs of PSVT and 1 brief run of nonsustained VT.  She was managed with beta-blocker therapy.  Toprol -XL was increased and May 2023 in the setting of hypertension though she believes this resulted in loss of taste (incidentally coincided with new chemotherapy).  She was switched to carvedilol  which caused significant fatigue and shortness of breath, and was switched back to Toprol -XL 25 mg daily.   Ms. Berringer was last seen in cardiology clinic in March 2020 for at which time she noted stable dyspnea on exertion.  Blood pressure was well-controlled at that time.  In the setting of statin intolerance, next visit was started.  Over the past year, she has been relatively stable.  She has chronic  dyspnea on exertion and is relatively sedentary.  She has been tolerating axles at well.  She does check her blood pressure periodically at home and notes it is typically in the 140s, similar to today.  In the setting of multiple intolerances to medications, she is not interested in titrating or adding medicine today.  She denies chest pain, palpitations, PND, orthopnea, dizziness, syncope, edema, or early satiety. Objective  Home Medications    Current Outpatient Medications  Medication Sig Dispense Refill   ALPRAZolam  (XANAX ) 0.5 MG tablet Take 1 tablet (0.5 mg total) by mouth at bedtime as needed for anxiety. 30 tablet 5   aspirin  EC 81 MG tablet Take by mouth.     Bempedoic Acid-Ezetimibe  (NEXLIZET ) 180-10 MG TABS Take 1 tablet by mouth daily. 90 tablet 3   fluticasone  (FLONASE ) 50 MCG/ACT nasal spray Place into the  nose.     omeprazole (PRILOSEC) 20 MG capsule Take by mouth.     metoprolol  succinate (TOPROL  XL) 25 MG 24 hr tablet Take 1 tablet (25 mg total) by mouth daily. 90 tablet 3   No current facility-administered medications for this visit.     Physical Exam    VS:  BP (!) 146/70   Pulse 78   Ht 5' 4.5" (1.638 m)   Wt 179 lb 12.8 oz (81.6 kg)   SpO2 97%   BMI 30.39 kg/m  , BMI Body mass index is 30.39 kg/m.     Today's Vitals   12/13/23 1526 12/13/23 1644  BP: (!) 146/70 (!) 146/70  Pulse: 78   SpO2: 97%   Weight: 179 lb 12.8 oz (81.6 kg)   Height: 5' 4.5" (1.638 m)    Body mass index is 30.39 kg/m.    GEN: Well nourished, well developed, in no acute distress. HEENT: normal. Neck: Supple, no JVD, carotid bruits, or masses. Cardiac: RRR, no murmurs, rubs, or gallops. No clubbing, cyanosis, edema.  Radials 2+/PT 2+ and equal bilaterally.  Respiratory:  Respirations regular and unlabored, clear to auscultation bilaterally. GI: Soft, nontender, nondistended, BS + x 4. MS: no deformity or atrophy. Skin: warm and dry, no rash. Neuro:  Strength and sensation are  intact. Psych: Normal affect.  Accessory Clinical Findings    ECG personally reviewed by me today - EKG Interpretation Date/Time:  Wednesday December 13 2023 15:26:20 EDT Ventricular Rate:  78 PR Interval:  158 QRS Duration:  72 QT Interval:  384 QTC Calculation: 437 R Axis:   27  Text Interpretation: Normal sinus rhythm Normal ECG Confirmed by Laneta Pintos [phone] on 12/13/2023 3:40:02 PM  - no acute changes.  Lab Results  Component Value Date   WBC 6.1 05/26/2021   HGB 12.8 05/26/2021   HCT 38.6 05/26/2021   MCV 93.0 05/26/2021   PLT 240 05/26/2021   Lab Results  Component Value Date   CREATININE 0.79 10/29/2021   BUN 18 10/29/2021   NA 141 10/29/2021   K 4.7 10/29/2021   CL 104 10/29/2021   CO2 25 10/29/2021   Lab Results  Component Value Date   ALT 19 01/04/2023   AST 25 01/04/2023   ALKPHOS 81 01/04/2023   BILITOT 1.0 01/04/2023   Lab Results  Component Value Date   CHOL 202 (H) 01/04/2023   HDL 91 01/04/2023   LDLCALC 92 01/04/2023   TRIG 97 01/04/2023   CHOLHDL 2.2 01/04/2023    Lab Results  Component Value Date   HGBA1C 5.6 12/22/2020   Lab Results  Component Value Date   TSH 3.040 12/22/2020       Assessment & Plan    1.  Primary hypertension: Blood pressure elevated today on 2 consecutive recordings.  She notes that pressures today are similar to what she has been seeing at home.  We discussed options for medication titration versus addition.  At this time, she wishes to defer.  She has had multiple drug intolerances and when metoprolol  was titrated in the past, she lost her taste for 9 months, though she is not sure if this was related to the chemotherapy she was receiving at the time.  She will continue to follow blood pressure at home and make a more concerted effort to remove sodium from her diet, and begin regular exercise.  We discussed that losartan may be an option for her in the future though she thinks she  may have been on it before  and did not tolerate.  I cannot see anywhere in her [facility] chart that this would have occurred.  2.  Nonobstructive coronary artery disease: Nonobstructive LAD stenosis by coronary CT angiogram in March 2023.  She has chronic dyspnea on exertion but does not experience chest pain.  She remains on aspirin , beta-blocker, and bempedoic acid.  3.  Hyperlipidemia: Statin intolerant.  Tolerating bempedoic acid well.  She had lipids drawn in March 2025 with total cholesterol 216, triglycerides 88, HDL 111.2, and LDL of 87.  4.  Disposition: Follow-up in cardiology clinic in 1 year or sooner if necessary.  Laneta Pintos, NP 12/13/2023, 5:03 PM

## 2023-12-13 NOTE — Patient Instructions (Signed)
 Medication Instructions:  No changes *If you need a refill on your cardiac medications before your next appointment, please call your pharmacy*  Lab Work: None ordered If you have labs (blood work) drawn today and your tests are completely normal, you will receive your results only by: MyChart Message (if you have MyChart) OR A paper copy in the mail If you have any lab test that is abnormal or we need to change your treatment, we will call you to review the results.  Testing/Procedures: None ordered  Follow-Up: At [facility], you and your health needs are our priority.  As part of our continuing mission to provide you with exceptional heart care, our providers are all part of one team.  This team includes your primary Cardiologist (physician) and Advanced Practice Providers or APPs (Physician Assistants and Nurse Practitioners) who all work together to provide you with the care you need, when you need it.  Your next appointment:   12 month(s)  Provider:   You may see Julien Nordmann, MD or one of the following Advanced Practice Providers on your designated Care Team:   Nicolasa Ducking, NP Ames Dura, PA-C Eula Listen, PA-C Cadence [place], PA-C Charlsie Quest, NP Carlos Levering, NP    We recommend signing up for the patient portal called "MyChart".  Sign up information is provided on this After Visit Summary.  MyChart is used to connect with patients for Virtual Visits (Telemedicine).  Patients are able to view lab/test results, encounter notes, upcoming appointments, etc.  Non-urgent messages can be sent to your provider as well.   To learn more about what you can do with MyChart, go to [email or web address].

## 2023-12-18 MED ORDER — NEXLIZET 180-10 MG PO TABS: 1.0000 | ORAL_TABLET | Freq: Every day | ORAL | 3 refills | Status: AC

## 2024-07-31 ENCOUNTER — Ambulatory Visit
Admission: RE | Admit: 2024-07-31 | Discharge: 2024-07-31 | Disposition: A | Discharge status: Home / Self Care | Source: Ambulatory Visit | Attending: Nurse Practitioner | Admitting: Nurse Practitioner

## 2024-07-31 DIAGNOSIS — M7989 Other specified soft tissue disorders: Secondary | ICD-10-CM | POA: Insufficient documentation

## 2024-07-31 DIAGNOSIS — M79652 Pain in left thigh: Secondary | ICD-10-CM

## 2024-08-07 ENCOUNTER — Telehealth: Payer: Self-pay | Admitting: Nurse Practitioner

## 2024-08-07 NOTE — Telephone Encounter (Signed)
 Samule with [employer] called in asking for an update on drug therapy request. Informed her I didn't see document or note about it yet. She states she will fax it over again. She needs the provider to fill out and fax back.

## 2024-08-12 NOTE — Telephone Encounter (Signed)
 Forms have not been received as of today.

## 2024-08-20 NOTE — Telephone Encounter (Signed)
 Attempted to call Michelle Pitts at the number entered at [facility] as the forms as yet to be received. The number is no longer valid.
